# Patient Record
Sex: Male | Born: 1935 | Race: White | Hispanic: No | Marital: Married | Smoking: Former smoker
Health system: Southern US, Community
[De-identification: ages and names within clinical notes are randomized; demographics above are authoritative.]

## PROBLEM LIST (undated history)

## (undated) DIAGNOSIS — I5022 Chronic systolic (congestive) heart failure: Secondary | ICD-10-CM

## (undated) DIAGNOSIS — R51 Headache: Secondary | ICD-10-CM

## (undated) DIAGNOSIS — I739 Peripheral vascular disease, unspecified: Secondary | ICD-10-CM

## (undated) DIAGNOSIS — I350 Nonrheumatic aortic (valve) stenosis: Secondary | ICD-10-CM

## (undated) DIAGNOSIS — G4733 Obstructive sleep apnea (adult) (pediatric): Secondary | ICD-10-CM

## (undated) DIAGNOSIS — N200 Calculus of kidney: Secondary | ICD-10-CM

## (undated) DIAGNOSIS — Z9889 Other specified postprocedural states: Secondary | ICD-10-CM

## (undated) DIAGNOSIS — M1990 Unspecified osteoarthritis, unspecified site: Secondary | ICD-10-CM

## (undated) DIAGNOSIS — I214 Non-ST elevation (NSTEMI) myocardial infarction: Secondary | ICD-10-CM

## (undated) DIAGNOSIS — E785 Hyperlipidemia, unspecified: Secondary | ICD-10-CM

## (undated) DIAGNOSIS — Z8719 Personal history of other diseases of the digestive system: Secondary | ICD-10-CM

## (undated) DIAGNOSIS — I2510 Atherosclerotic heart disease of native coronary artery without angina pectoris: Secondary | ICD-10-CM

## (undated) DIAGNOSIS — R519 Headache, unspecified: Secondary | ICD-10-CM

## (undated) DIAGNOSIS — I469 Cardiac arrest, cause unspecified: Secondary | ICD-10-CM

## (undated) HISTORY — PX: CORONARY ARTERY BYPASS GRAFT: SHX141

## (undated) HISTORY — PX: HERNIA REPAIR: SHX51

## (undated) HISTORY — PX: TONSILLECTOMY: SUR1361

## (undated) HISTORY — PX: ADENOIDECTOMY: SHX5191

## (undated) HISTORY — DX: Hyperlipidemia, unspecified: E78.5

## (undated) HISTORY — DX: Peripheral vascular disease, unspecified: I73.9

## (undated) HISTORY — PX: OTHER SURGICAL HISTORY: SHX169

## (undated) HISTORY — DX: Cardiac arrest, cause unspecified: I46.9

## (undated) HISTORY — DX: Obstructive sleep apnea (adult) (pediatric): G47.33

## (undated) HISTORY — PX: CARDIAC DEFIBRILLATOR PLACEMENT: SHX171

## (undated) HISTORY — DX: Atherosclerotic heart disease of native coronary artery without angina pectoris: I25.10

## (undated) HISTORY — PX: APPENDECTOMY: SHX54

---

## 2001-05-08 ENCOUNTER — Ambulatory Visit (HOSPITAL_COMMUNITY): Admission: RE | Admit: 2001-05-08 | Discharge: 2001-05-08 | Payer: Self-pay | Admitting: *Deleted

## 2001-10-02 ENCOUNTER — Ambulatory Visit (HOSPITAL_COMMUNITY): Admission: RE | Admit: 2001-10-02 | Discharge: 2001-10-02 | Payer: Self-pay | Admitting: Cardiology

## 2001-10-09 ENCOUNTER — Ambulatory Visit (HOSPITAL_COMMUNITY): Admission: RE | Admit: 2001-10-09 | Discharge: 2001-10-10 | Payer: Self-pay | Admitting: Cardiology

## 2001-10-31 ENCOUNTER — Ambulatory Visit (HOSPITAL_COMMUNITY): Admission: RE | Admit: 2001-10-31 | Discharge: 2001-10-31 | Payer: Self-pay | Admitting: Cardiology

## 2002-01-26 ENCOUNTER — Encounter (HOSPITAL_COMMUNITY): Admission: RE | Admit: 2002-01-26 | Discharge: 2002-03-23 | Payer: Self-pay | Admitting: Cardiology

## 2004-03-09 ENCOUNTER — Inpatient Hospital Stay (HOSPITAL_BASED_OUTPATIENT_CLINIC_OR_DEPARTMENT_OTHER): Admission: RE | Admit: 2004-03-09 | Discharge: 2004-03-09 | Payer: Self-pay | Admitting: Cardiology

## 2004-03-14 ENCOUNTER — Ambulatory Visit (HOSPITAL_COMMUNITY): Admission: RE | Admit: 2004-03-14 | Discharge: 2004-03-15 | Payer: Self-pay | Admitting: Cardiology

## 2005-10-18 ENCOUNTER — Inpatient Hospital Stay (HOSPITAL_COMMUNITY): Admission: RE | Admit: 2005-10-18 | Discharge: 2005-10-19 | Payer: Self-pay | Admitting: Cardiology

## 2007-02-13 ENCOUNTER — Ambulatory Visit (HOSPITAL_COMMUNITY): Admission: RE | Admit: 2007-02-13 | Discharge: 2007-02-13 | Payer: Self-pay | Admitting: Cardiology

## 2007-06-16 ENCOUNTER — Ambulatory Visit (HOSPITAL_COMMUNITY): Admission: RE | Admit: 2007-06-16 | Discharge: 2007-06-16 | Payer: Self-pay | Admitting: General Surgery

## 2008-01-27 ENCOUNTER — Inpatient Hospital Stay (HOSPITAL_COMMUNITY): Admission: RE | Admit: 2008-01-27 | Discharge: 2008-01-28 | Payer: Self-pay | Admitting: Cardiology

## 2008-02-19 ENCOUNTER — Ambulatory Visit: Payer: Self-pay | Admitting: *Deleted

## 2008-03-17 ENCOUNTER — Ambulatory Visit: Payer: Self-pay | Admitting: *Deleted

## 2008-03-17 ENCOUNTER — Ambulatory Visit (HOSPITAL_COMMUNITY): Admission: RE | Admit: 2008-03-17 | Discharge: 2008-03-17 | Payer: Self-pay | Admitting: *Deleted

## 2008-04-15 ENCOUNTER — Ambulatory Visit: Payer: Self-pay | Admitting: *Deleted

## 2008-07-16 ENCOUNTER — Ambulatory Visit: Payer: Self-pay | Admitting: *Deleted

## 2008-09-02 ENCOUNTER — Ambulatory Visit: Payer: Self-pay | Admitting: *Deleted

## 2008-09-17 ENCOUNTER — Ambulatory Visit: Payer: Self-pay | Admitting: Vascular Surgery

## 2008-09-17 ENCOUNTER — Ambulatory Visit (HOSPITAL_COMMUNITY): Admission: RE | Admit: 2008-09-17 | Discharge: 2008-09-17 | Payer: Self-pay | Admitting: *Deleted

## 2008-10-28 ENCOUNTER — Ambulatory Visit: Payer: Self-pay | Admitting: *Deleted

## 2009-05-04 ENCOUNTER — Encounter: Admission: RE | Admit: 2009-05-04 | Discharge: 2009-05-04 | Payer: Self-pay | Admitting: Cardiology

## 2009-05-10 ENCOUNTER — Ambulatory Visit (HOSPITAL_COMMUNITY): Admission: RE | Admit: 2009-05-10 | Discharge: 2009-05-10 | Payer: Self-pay | Admitting: Cardiology

## 2009-08-29 ENCOUNTER — Ambulatory Visit: Payer: Self-pay | Admitting: Emergency Medicine

## 2009-08-29 ENCOUNTER — Inpatient Hospital Stay (HOSPITAL_COMMUNITY): Admission: EM | Admit: 2009-08-29 | Discharge: 2009-09-13 | Payer: Self-pay | Admitting: Emergency Medicine

## 2009-08-29 ENCOUNTER — Ambulatory Visit: Payer: Self-pay | Admitting: Internal Medicine

## 2009-09-01 ENCOUNTER — Encounter: Payer: Self-pay | Admitting: Cardiology

## 2009-09-05 ENCOUNTER — Encounter: Payer: Self-pay | Admitting: Internal Medicine

## 2009-09-21 ENCOUNTER — Encounter: Payer: Self-pay | Admitting: Internal Medicine

## 2009-09-28 ENCOUNTER — Encounter: Payer: Self-pay | Admitting: Internal Medicine

## 2009-09-28 ENCOUNTER — Ambulatory Visit: Payer: Self-pay

## 2009-09-29 ENCOUNTER — Encounter (HOSPITAL_COMMUNITY): Admission: RE | Admit: 2009-09-29 | Discharge: 2009-12-28 | Payer: Self-pay | Admitting: Cardiology

## 2009-10-12 ENCOUNTER — Encounter: Admission: RE | Admit: 2009-10-12 | Discharge: 2009-10-12 | Payer: Self-pay | Admitting: Cardiology

## 2009-10-21 ENCOUNTER — Ambulatory Visit: Payer: Self-pay | Admitting: Internal Medicine

## 2009-10-21 DIAGNOSIS — I4891 Unspecified atrial fibrillation: Secondary | ICD-10-CM | POA: Insufficient documentation

## 2009-10-22 ENCOUNTER — Encounter: Payer: Self-pay | Admitting: Internal Medicine

## 2009-11-04 ENCOUNTER — Encounter: Payer: Self-pay | Admitting: Internal Medicine

## 2009-11-07 ENCOUNTER — Telehealth: Payer: Self-pay | Admitting: Internal Medicine

## 2009-11-11 ENCOUNTER — Telehealth: Payer: Self-pay | Admitting: Internal Medicine

## 2009-11-11 ENCOUNTER — Ambulatory Visit: Payer: Self-pay | Admitting: Cardiology

## 2009-11-11 ENCOUNTER — Encounter: Payer: Self-pay | Admitting: Internal Medicine

## 2009-11-14 ENCOUNTER — Encounter (INDEPENDENT_AMBULATORY_CARE_PROVIDER_SITE_OTHER): Payer: Self-pay | Admitting: *Deleted

## 2009-11-14 ENCOUNTER — Encounter: Payer: Self-pay | Admitting: Internal Medicine

## 2009-11-16 ENCOUNTER — Ambulatory Visit (HOSPITAL_COMMUNITY): Admission: RE | Admit: 2009-11-16 | Discharge: 2009-11-16 | Payer: Self-pay | Admitting: Cardiology

## 2009-11-16 ENCOUNTER — Ambulatory Visit: Payer: Self-pay | Admitting: Cardiology

## 2009-11-16 HISTORY — PX: CARDIAC CATHETERIZATION: SHX172

## 2009-11-18 ENCOUNTER — Ambulatory Visit: Payer: Self-pay | Admitting: Cardiology

## 2009-12-02 ENCOUNTER — Ambulatory Visit: Payer: Self-pay | Admitting: Cardiology

## 2009-12-13 ENCOUNTER — Ambulatory Visit: Payer: Self-pay | Admitting: Cardiology

## 2009-12-13 ENCOUNTER — Ambulatory Visit: Payer: Self-pay | Admitting: Internal Medicine

## 2009-12-21 ENCOUNTER — Encounter: Payer: Self-pay | Admitting: Internal Medicine

## 2009-12-21 ENCOUNTER — Ambulatory Visit: Payer: Self-pay | Admitting: Cardiology

## 2010-01-07 ENCOUNTER — Encounter (HOSPITAL_COMMUNITY): Admission: RE | Admit: 2010-01-07 | Discharge: 2010-03-08 | Payer: Self-pay | Admitting: Cardiology

## 2010-01-19 ENCOUNTER — Emergency Department (HOSPITAL_COMMUNITY): Admission: EM | Admit: 2010-01-19 | Discharge: 2010-01-19 | Payer: Self-pay | Admitting: Emergency Medicine

## 2010-02-20 ENCOUNTER — Ambulatory Visit: Payer: Self-pay | Admitting: Cardiology

## 2010-02-24 ENCOUNTER — Ambulatory Visit: Payer: Self-pay | Admitting: Cardiology

## 2010-03-16 ENCOUNTER — Ambulatory Visit: Payer: Self-pay | Admitting: Internal Medicine

## 2010-03-30 ENCOUNTER — Ambulatory Visit: Payer: Self-pay | Admitting: Internal Medicine

## 2010-03-30 DIAGNOSIS — Z95810 Presence of automatic (implantable) cardiac defibrillator: Secondary | ICD-10-CM | POA: Insufficient documentation

## 2010-04-18 ENCOUNTER — Encounter (INDEPENDENT_AMBULATORY_CARE_PROVIDER_SITE_OTHER): Payer: Self-pay | Admitting: *Deleted

## 2010-04-19 ENCOUNTER — Encounter (INDEPENDENT_AMBULATORY_CARE_PROVIDER_SITE_OTHER): Payer: Self-pay | Admitting: *Deleted

## 2010-04-24 ENCOUNTER — Encounter
Admission: RE | Admit: 2010-04-24 | Discharge: 2010-04-24 | Payer: Self-pay | Source: Home / Self Care | Attending: Orthopedic Surgery | Admitting: Orthopedic Surgery

## 2010-05-11 NOTE — Assessment & Plan Note (Signed)
Summary: rov/per dr Marthe Dant/jml   Visit Type:  new pt visit Primary Provider:  [name]   History of Present Illness:   [name] is seen in followup for aborted cardiac arrest in the setting of ischemic heart disease prior bypass grafting and redo bypass grafting most recently in 1994 with subsequent PCI and mild aortic stenosis.  echo in June 2011 demonstrated ejection fraction of 35-40%.  Since discharge he is into quite well. However he has had some episodes of throat tightness which ffor  him is an anginal equivalent. Dr. Patty Sermons in response this is up titrating his beta blocker.  He remains in atrial fibrillation. He notes no impairment in exercise tolerance. No dizziness or palpitations. From embolic risk factors are notable for age vascular disease and hypertensionand LV dysfunction.  the CHADS VASC score of 4. He is on Coumadin  Current Medications (verified): 1)  Aspirin 81 Mg Tbec (Aspirin) .... Take 1 Tablet By Mouth Once Daily 2)  Furosemide 40 Mg Tabs (Furosemide) .... Take 1 Tablet By Mouth Once Daily 3)  Isosorbide Mononitrate Cr 30 Mg Xr24h-Tab (Isosorbide Mononitrate) .... Take 1 Tablet By Mouth Once Daily 4)  Lisinopril 20 Mg Tabs (Lisinopril) .Marland Kitchen.. 1 Tab Qam 5)  Metoprolol Tartrate 25 Mg Tabs (Metoprolol Tartrate) .... 4 Tabs Qam..3 Tabs At Bedtime 6)  Potassium Chloride Cr 10 Meq Cr-Tabs (Potassium Chloride) .Marland Kitchen.. 1 Tab Two Times A Day 7)  Crestor 20 Mg Tabs (Rosuvastatin Calcium) .... Take 1 Tablet By Mouth Once Daily 8)  Warfarin Sodium 2.5 Mg Tabs (Warfarin Sodium) .... Take As Directed By Protime Results. 9)  Multivitamins  Tabs (Multiple Vitamin) .... Take 1 Tablet By Mouth Once Daily 10)  Nitrostat 0.4 Mg Subl (Nitroglycerin) .... Take 1 Tablet By Mouth Every 5 Minutes As Needed Up To 3 Doses As Needed 11)  Plavix 75 Mg Tabs (Clopidogrel Bisulfate) .... Take 1 Tablet By Mouth Every Morning. 12)  Slo-Niacin 500 Mg Cr-Tabs (Niacin) .... Take 1 Capsule By  Mouth Once Daily 13)  B-100  Tabs (Vitamins-Lipotropics) .... Take 1 Tablet By Mouth Once Daily  Allergies: 1)  ! Tetracycline  Past History:  Past Medical History: Last updated: 10/19/2009 Aborted cardiac arrest; atrial flutter/fibrillation Medtronic Secure ICD  Past Surgical History: Last updated: 10/19/2009  PAST SURGICAL HISTORY:  CABG in 1982 and in 1984 with subsequent PTCI Appendectomy Tonsillectomy and adenoidectomy  Cataract surgery Left elbow tendon surgery  Family History: Last updated: 10/19/2009  His family history is significant for early coronary artery disease in   several members  Social History: Last updated: 10/19/2009 The patient is a former remote smoker He does not use any other substances He does not use alcohol  Vital Signs:  Patient profile:   77 year old male Height:      66 inches Weight:      144 pounds BMI:     23.33 Pulse rate:   79 / minute BP sitting:   106 / 71  (left arm) Cuff size:   regular  Vitals Entered By: Danielle Rankin, CMA (October 21, 2009 2:07 PM)  Physical Exam  General:  The patient was alert and oriented in no acute distress. HEENT Normal.  Neck veins were flat, carotids were brisk.  Lungs were clear.  Heart sounds were regular without murmurs or gallops. device pocket is well-healed Abdomen was soft with active bowel sounds. There is no clubbing cyanosis or edema. Skin Warm and dry     ICD Specifications Following  MD:  [name], MD     ICD Vendor:  Medtronic     ICD Model Number:  [phone]     ICD Serial Number:  [id number] ICD DOI:  09/07/2009     ICD Implanting MD:  [name], MD  Lead 1:    Location: RA     DOI: 09/07/2009     Model #: [id number]     Serial #: [id number]     Status: active Lead 2:    Location: RV     DOI: 09/07/2009     Model #: [id number]     Serial #: [id number]     Status: active  Indications::  Aborted cardiac arrest   ICD Follow Up Battery Voltage:  3.19 V     Charge Time:  9.3 seconds      Underlying rhythm:  AF ICD Dependent:  No       ICD Device Measurements Atrium:  Amplitude: 1.5 mV, Impedance: 494 ohms,  Right Ventricle:  Amplitude: 10.9 mV, Impedance: 418 ohms, Threshold: 1.00 V at 0.40 msec Shock Impedance: 37/45 ohms   Episodes MS Episodes:  1     Percent Mode Switch:  100%     Shock:  0     ATP:  0     Nonsustained:  0     Atrial Therapies:  0 Ventricular Pacing:  65.7%  Brady Parameters Mode MVP     Lower Rate Limit:  60     Upper Rate Limit 130 PAV 180     Sensed AV Delay:  150  Tachy Zones VF:  200     VT:  OFF     VT1:  200-240 (VIA VF)     Tech Comments:  PT IN AF 100% OF TIME.  NORMAL DEVICE FUNCTION.  NO CHANGES MADE.  OV W/SK IN SEPTEMBER ALREADY SCHEDULED.  Vella Kohler  Impression & Recommendations:  Problem # 1:  ATRIAL FIBRILLATION (ICD-427.31) The patient has atrial fibrillation which has been persistent. He unfortunately had a subtherapeutic INR process albeit 1.9) last week. We'll anticipate undertaking cardioversion in 3 weeks time if his INR today is therapeutic. We have reviewed extensively the issues of rate versus rhythm control. He has no clear symptoms attributable to his atrial fibrillation. Hence I would be disinclined to pursue antiarrhythmic trials for the maintenance of sinus rhythm. hoping that he will maintain sinus rhythm following an unsupported cardioversion I think however makes good sense  Problem # 2:  ABORTED CARDIAC ARREST (ICD-427.5) no intercurrent ventricular arrhythmia  Problem # 3:  CARDIOMYOPATHY, ISCHEMIC S/P CABG (ICD-414.8) recurrent chest pains as of up titration of his beta blocker. His updated medication list for this problem includes:    Aspirin 81 Mg Tbec (Aspirin) .Marland Kitchen... Take 1 tablet by mouth once daily    Furosemide 40 Mg Tabs (Furosemide) .Marland Kitchen... Take 1 tablet by mouth once daily    Isosorbide Mononitrate Cr 30 Mg Xr24h-tab (Isosorbide mononitrate) .Marland Kitchen... Take 1 tablet by mouth once daily    Lisinopril  20 Mg Tabs (Lisinopril) .Marland Kitchen... 1 tab qam    Metoprolol Tartrate 25 Mg Tabs (Metoprolol tartrate) .Marland KitchenMarland KitchenMarland KitchenMarland Kitchen 4 tabs qam..3 tabs at bedtime    Warfarin Sodium 2.5 Mg Tabs (Warfarin sodium) .Marland Kitchen... Take as directed by protime results.    Nitrostat 0.4 Mg Subl (Nitroglycerin) .Marland Kitchen... Take 1 tablet by mouth every 5 minutes as needed up to 3 doses as needed    Plavix 75 Mg Tabs (Clopidogrel bisulfate) .Marland Kitchen... Take 1 tablet  by mouth every morning.  Patient Instructions: 1)  Call next week with INR results to schedule cardioversion.

## 2010-05-11 NOTE — Miscellaneous (Signed)
Summary: Device preload  Clinical Lists Changes  Observations: Added new observation of ICD INDICATN: Aborted cardiac arrest (09/21/2009 12:31) Added new observation of ICDLEADSTAT2: active (09/21/2009 12:31) Added new observation of ICDLEADSER2: [id number] (09/21/2009 12:31) Added new observation of ICDLEADMOD2: 6947  (09/21/2009 12:31) Added new observation of ICDLEADLOC2: RV  (09/21/2009 12:31) Added new observation of ICDLEADSTAT1: active  (09/21/2009 12:31) Added new observation of ICDLEADSER1: [id number]  (09/21/2009 12:31) Added new observation of ICDLEADMOD1: 5076  (09/21/2009 12:31) Added new observation of ICDLEADLOC1: RA  (09/21/2009 12:31) Added new observation of ICDLEADDOI2: 09/07/2009  (09/21/2009 12:31) Added new observation of ICDLEADDOI1: 09/07/2009  (09/21/2009 12:31) Added new observation of ICD IMP MD: [name], MD  (09/21/2009 12:31) Added new observation of ICD IMPL DTE: 09/07/2009  (09/21/2009 12:31) Added new observation of ICD SERL#: [id number] H  (09/21/2009 12:31) Added new observation of ICD MODL#: D204DRM  (09/21/2009 12:31) Added new observation of ICDMANUFACTR: Medtronic  (09/21/2009 12:31) Added new observation of ICD MD: [name], MD  (09/21/2009 12:31)       ICD Specifications Following MD:  [name], MD     ICD Vendor:  Medtronic     ICD Model Number:  [id number]     ICD Serial Number:  [id number] ICD DOI:  09/07/2009     ICD Implanting MD:  [name], MD  Lead 1:    Location: RA     DOI: 09/07/2009     Model #: [id number]     Serial #: [id number]     Status: active Lead 2:    Location: RV     DOI: 09/07/2009     Model #: 4403     Serial #: [id number]     Status: active  Indications::  Aborted cardiac arrest

## 2010-05-11 NOTE — Progress Notes (Signed)
Summary: calling re getting his heart in rthym  Phone Note Call from Patient   Reason for Call: Talk to Nurse Summary of Call: pt wants to talk to a nurse about getting his heart back in rhythm pls call [phone] Initial call taken by: Glynda Jaeger,  November 07, 2009 2:33 PM  Follow-up for Phone Call        PT AWARE  ONCE INR DONE ON FRI 11/11/09 WITH RESULTS OF GREATER THAN 2 MAY PROCEED WITH SCHEDULING CARDIOVERSION .WILL SCHEDULE ONCE INR RESULTS RECEIVED . PT AWARE.  Follow-up by: Scherrie Bateman, LPN,  November 07, 2009 3:47 PM  Additional Follow-up for Phone Call Additional follow up Details #1::        DCCV discussed with heather schub Additional Follow-up by: Nathen May, MD, [facility],  November 14, 2009 1:56 PM    Additional Follow-up for Phone Call Additional follow up Details #2::    Pt scheduled for 11/16/09 with Dr. Shirlee Latch. Pt aware. Follow-up by: Sherri Rad, RN, BSN,  November 14, 2009 2:22 PM

## 2010-05-11 NOTE — Progress Notes (Signed)
Summary: calling re procedure  Phone Note Call from Patient   Caller: Patient [phone] Reason for Call: Talk to Nurse Summary of Call: questions re procedure at the hosptial to shock his heart back into rythympls call [phone] Initial call taken by: Glynda Jaeger,  November 11, 2009 3:40 PM  Follow-up for Phone Call        I spoke with [name] who reports having his INR drawn today at [facility].  We have not received the results yet.  He would like to schedule his cardioversion as soon as possible.  I will forward this to Dr.Klein's nurse.  Mylo Red RN

## 2010-05-11 NOTE — Assessment & Plan Note (Signed)
Summary: 5 month rov.sl   Visit Type:  ICD-Medtronic Primary Provider:  [name]  CC:  no complaints.  History of Present Illness:   [name] is seen in followup for aborted cardiac arrest in the setting of ischemic heart disease prior bypass grafting and redo bypass grafting most recently in 1994 with subsequent PCI and mild aortic stenosis.  echo in June 2011 demonstrated ejection fraction of 35-40%.   when we last saw him he was in atrial fibrillation. He noted no impairment in exercise tolerance. No dizziness or palpitations. Thrombo- embolic risk factors are notable for age vascular disease and hypertensionand LV dysfunction.  the CHADS VASC score of 4. He was on Coumadin.      he has intercurrently undergone cardioversion, and low and  his energy level is markedly improved.  subsequent to that, Dr. Patty Sermons discontinued his Coumadin.  serendipitously, his Coumadin was off therefore when a 6 foot tree limb fell 70 feet and clunked  him on the head  Problems Prior to Update: 1)  Automatic Implantable Cardiac Defibrillator Situ  (ICD-V45.02) 2)  Cardiomyopathy, Ischemic S/p Cabg  (ICD-414.8) 3)  Aborted Cardiac Arrest  (ICD-427.5) 4)  Atrial Fibrillation  (ICD-427.31)  Current Medications (verified): 1)  Aspirin 81 Mg Tbec (Aspirin) .... Take 1 Tablet By Mouth Once Daily 2)  Furosemide 40 Mg Tabs (Furosemide) .... Take 1 Tablet By Mouth Once Daily 3)  Isosorbide Mononitrate Cr 30 Mg Xr24h-Tab (Isosorbide Mononitrate) .... Take One Tablet By Mouth Daily 4)  Lisinopril 20 Mg Tabs (Lisinopril) .Marland Kitchen.. 1 Tab Qam 5)  Metoprolol Tartrate 25 Mg Tabs (Metoprolol Tartrate) .... 4 Tabs Qam..3 Tabs At Bedtime 6)  Potassium Chloride Cr 10 Meq Cr-Tabs (Potassium Chloride) .Marland Kitchen.. 1 Tab Two Times A Day 7)  Crestor 20 Mg Tabs (Rosuvastatin Calcium) .... Take 1 Tablet By Mouth Once Daily 8)  Multivitamins  Tabs (Multiple Vitamin) .... Take 1 Tablet By Mouth Once Daily 9)  Nitrostat 0.4 Mg  Subl (Nitroglycerin) .... Take 1 Tablet By Mouth Every 5 Minutes As Needed Up To 3 Doses As Needed 10)  Plavix 75 Mg Tabs (Clopidogrel Bisulfate) .... Take 1 Tablet By Mouth Every Morning. 11)  Slo-Niacin 500 Mg Cr-Tabs (Niacin) .... Take 1 Capsule By Mouth Once Daily 12)  B-100  Tabs (Vitamins-Lipotropics) .... Take 1 Tablet By Mouth Once Daily 13)  Stagesic 5-500 Mg Caps (Hydrocodone-Acetaminophen) .... As Needed  Allergies (verified): 1)  ! Tetracycline 2)  ! Flexeril  Past History:  Past Medical History: Last updated: 10/19/2009 Aborted cardiac arrest; atrial flutter/fibrillation Medtronic Secure ICD  Past Surgical History: Last updated: 10/19/2009  PAST SURGICAL HISTORY:  CABG in 1982 and in 1984 with subsequent PTCI Appendectomy Tonsillectomy and adenoidectomy  Cataract surgery Left elbow tendon surgery  Family History: Last updated: 03/30/2010  His family history is significant for early coronary artery disease in   several members  Social History: Last updated: 03/30/2010 The patient is a former remote smoker He does not use any other substances He does not use alcohol  Family History:  His family history is significant for early coronary artery disease in   several members  Social History: The patient is a former remote smoker He does not use any other substances He does not use alcohol  Vital Signs:  Patient profile:   77 year old male Height:      66 inches Weight:      146.75 pounds BMI:     23.77 Pulse rate:  59 / minute BP sitting:   111 / 58  (left arm) Cuff size:   regular  Vitals Entered By: [name] (March 30, 2010 10:36 AM)  Physical Exam  General:  The patient was alert and oriented in no acute distress. HEENT Normal.  Neck veins were flat, carotids were brisk.  Lungs were clear.  Heart sounds were regular without murmurs or gallops.  Abdomen was soft with active bowel sounds. There is no clubbing cyanosis or edema. Skin  Warm and dry     ICD Specifications Following MD:  [name], MD     ICD Vendor:  Medtronic     ICD Model Number:  [id number]     ICD Serial Number:  [id number] ICD DOI:  09/07/2009     ICD Implanting MD:  [name], MD  Lead 1:    Location: RA     DOI: 09/07/2009     Model #: 4098     Serial #: [id number]     Status: active Lead 2:    Location: RV     DOI: 09/07/2009     Model #: 6213     Serial #: [id number]     Status: active  Indications::  Aborted cardiac arrest   ICD Follow Up Remote Check?  No Battery Voltage:  3.18 V     Charge Time:  8.6 seconds     Underlying rhythm:  SR ICD Dependent:  No       ICD Device Measurements Atrium:  Amplitude: 2.0 mV, Impedance: 532 ohms, Threshold: 0.5 V at 0.4 msec Right Ventricle:  Amplitude: 8.1 mV, Impedance: 418 ohms, Threshold: 1.375 V at 0.4 msec Shock Impedance: 37/48 ohms   Episodes MS Episodes:  0     Percent Mode Switch:  0     Coumadin:  No Shock:  0     ATP:  0     Nonsustained:  0     Atrial Pacing:  65.6%     Ventricular Pacing:  0.2%  Brady Parameters Mode MVP     Lower Rate Limit:  60     Upper Rate Limit 130 PAV 180     Sensed AV Delay:  150  Tachy Zones VF:  200     VT:  OFF     VT1:  200-240 (VIA VF)     Next Remote Date:  06/29/2010     Next Cardiology Appt Due:  03/10/2011 Tech Comments:  No parameter changes.  Device function normal.  Optivol and thoracic impedance abnormal ongoing since 12/20.  Carelink transmissions every 3 months.  ROV 1 year with Dr. [name]. [name], LPN  March 30, 2010 11:09 AM   Impression & Recommendations:  Problem # 1:  ATRIAL FIBRILLATION (ICD-427.31) the patient is holding sinus rhythm. It would be my recommendation that he be maintained on oral anticoagulation given his high CHADS VASC score. The following medications were removed from the medication list:    Warfarin Sodium 2.5 Mg Tabs (Warfarin sodium) .Marland Kitchen... Take as directed by protime results. His updated medication  list for this problem includes:    Aspirin 81 Mg Tbec (Aspirin) .Marland Kitchen... Take 1 tablet by mouth once daily    Metoprolol Tartrate 25 Mg Tabs (Metoprolol tartrate) .Marland KitchenMarland KitchenMarland KitchenMarland Kitchen 4 tabs qam..3 tabs at bedtime    Plavix 75 Mg Tabs (Clopidogrel bisulfate) .Marland Kitchen... Take 1 tablet by mouth every morning.  Problem # 2:  CARDIOMYOPATHY, ISCHEMIC S/P CABG (ICD-414.8) stable on current medications  Problem # 3:  AUTOMATIC IMPLANTABLE CARDIAC DEFIBRILLATOR SITU (ICD-V45.02) Device parameters and data were reviewed and no changes were made  optivol is high buth the impedance is climbing  Patient Instructions: 1)  Your physician recommends that you continue on your current medications as directed. Please refer to the Current Medication list given to you today. 2)  Your physician wants you to follow-up in: 1 year You will receive a reminder letter in the mail two months in advance. If you don't receive a letter, please call our office to schedule the follow-up appointment.

## 2010-05-11 NOTE — Assessment & Plan Note (Signed)
Summary: PACER CHECK./CY   Primary Provider:  [name]  CC:  pacer check.  History of Present Illness:   [name] is seen in followup for aborted cardiac arrest in the setting of ischemic heart disease prior bypass grafting and redo bypass grafting most recently in 1994 with subsequent PCI and mild aortic stenosis.  echo in June 2011 demonstrated ejection fraction of 35-40%.   when we last saw him he was in atrial fibrillation. He noted no impairment in exercise tolerance. No dizziness or palpitations. Thrombo- embolic risk factors are notable for age vascular disease and hypertensionand LV dysfunction.  the CHADS VASC score of 4. He is on Coumadin.      he has intercurrently undergone cardioversion, and low and  his energy level is markedly improved.  Current Medications (verified): 1)  Aspirin 81 Mg Tbec (Aspirin) .... Take 1 Tablet By Mouth Once Daily 2)  Furosemide 40 Mg Tabs (Furosemide) .... Take 1 Tablet By Mouth Once Daily 3)  Isosorbide Mononitrate Cr 60 Mg Xr24h-Tab (Isosorbide Mononitrate) .... Take One Tablet Once Daily 4)  Lisinopril 20 Mg Tabs (Lisinopril) .Marland Kitchen.. 1 Tab Qam 5)  Metoprolol Tartrate 25 Mg Tabs (Metoprolol Tartrate) .... 4 Tabs Qam..3 Tabs At Bedtime 6)  Potassium Chloride Cr 10 Meq Cr-Tabs (Potassium Chloride) .Marland Kitchen.. 1 Tab Two Times A Day 7)  Crestor 20 Mg Tabs (Rosuvastatin Calcium) .... Take 1 Tablet By Mouth Once Daily 8)  Warfarin Sodium 2.5 Mg Tabs (Warfarin Sodium) .... Take As Directed By Protime Results. 9)  Multivitamins  Tabs (Multiple Vitamin) .... Take 1 Tablet By Mouth Once Daily 10)  Nitrostat 0.4 Mg Subl (Nitroglycerin) .... Take 1 Tablet By Mouth Every 5 Minutes As Needed Up To 3 Doses As Needed 11)  Plavix 75 Mg Tabs (Clopidogrel Bisulfate) .... Take 1 Tablet By Mouth Every Morning. 12)  Slo-Niacin 500 Mg Cr-Tabs (Niacin) .... Take 1 Capsule By Mouth Once Daily 13)  B-100  Tabs (Vitamins-Lipotropics) .... Take 1 Tablet By Mouth Once  Daily  Allergies (verified): 1)  ! Tetracycline  Past History:  Past Medical History: Last updated: 10/19/2009 Aborted cardiac arrest; atrial flutter/fibrillation Medtronic Secure ICD  Past Surgical History: Last updated: 10/19/2009  PAST SURGICAL HISTORY:  CABG in 1982 and in 1984 with subsequent PTCI Appendectomy Tonsillectomy and adenoidectomy  Cataract surgery Left elbow tendon surgery  Family History: Last updated: 10/19/2009  His family history is significant for early coronary artery disease in   several members  Social History: Last updated: 10/19/2009 The patient is a former remote smoker He does not use any other substances He does not use alcohol  Vital Signs:  Patient profile:   77 year old male Height:      66 inches Weight:      143 pounds BMI:     23.16 Pulse rate:   82 / minute Pulse rhythm:   regular BP sitting:   128 / 78  (right arm) Cuff size:   regular  Vitals Entered By: [name] CMA (December 13, 2009 10:20 AM)  Physical Exam  General:  The patient was alert and oriented in no acute distress. HEENT Normal.  Neck veins were flat, carotids were brisk.  Lungs were clear.  Heart sounds were regular without murmurs or gallops.  Abdomen was soft with active bowel sounds. There is no clubbing cyanosis or edema. Skin Warm and dry     ICD Specifications Following MD:  [name], MD     ICD Vendor:  Medtronic     ICD Model Number:  D204DRM     ICD Serial Number:  [id number] ICD DOI:  09/07/2009     ICD Implanting MD:  [name], MD  Lead 1:    Location: RA     DOI: 09/07/2009     Model #: 4098     Serial #: [id number]     Status: active Lead 2:    Location: RV     DOI: 09/07/2009     Model #: 6213     Serial #: [id number]     Status: active  Indications::  Aborted cardiac arrest   ICD Follow Up Battery Voltage:  3.17 V     Charge Time:  8.6 seconds     Underlying rhythm:  SR ICD Dependent:  No       ICD Device  Measurements Atrium:  Amplitude: 2.6 mV, Impedance: 532 ohms, Threshold: 0.50 V at 0.40 msec Right Ventricle:  Amplitude: 11.8 mV, Impedance: 475 ohms, Threshold: 1.00 V at 0.40 msec Shock Impedance: 43/53 ohms   Episodes MS Episodes:  0     Shock:  0     ATP:  0     Nonsustained:  0     Atrial Therapies:  0  Brady Parameters Mode MVP     Lower Rate Limit:  60     Upper Rate Limit 130 PAV 180     Sensed AV Delay:  150  Tachy Zones VF:  200     VT:  OFF     VT1:  200-240 (VIA VF)     Next Remote Date:  03/16/2010     Next Cardiology Appt Due:  09/11/2010 Tech Comments:  NORMAL DEVICE FUNCTION.  NO EPISODES SINCE LAST CHECK ON 11-16-09.  TURNED ON 1:1 SVT.  CHANGED RA OUTPUT FROM 3.5 TO 2.0 AND RV OUTPUT FROM 3.5 TO 2.5.  [facility] CHECK 03-16-10. ROV IN [employer] W/SK. Vella Kohler  December 13, 2009 10:37 AM  Impression & Recommendations:  Problem # 1:  ATRIAL FIBRILLATION (ICD-427.31) he is status post cardioversion. He is holding sinus rhythm. I've instructed him in the taking of his pulse. He is to call us in the event that he reverts to atrial fibrillation.  Given the marked improvement in his symptoms following cardioversion, I would anticipate using antiarrhythmic drug therapy in the event that he were to have frequent relapses of atrial fibrillation. He event that they were quite sporadic a greater than 6 months, serial cardioversion might be a reasonable approach His updated medication list for this problem includes:    Aspirin 81 Mg Tbec (Aspirin) .Marland Kitchen... Take 1 tablet by mouth once daily    Metoprolol Tartrate 25 Mg Tabs (Metoprolol tartrate) .Marland KitchenMarland KitchenMarland KitchenMarland Kitchen 4 tabs qam..3 tabs at bedtime    Warfarin Sodium 2.5 Mg Tabs (Warfarin sodium) .Marland Kitchen... Take as directed by protime results.    Plavix 75 Mg Tabs (Clopidogrel bisulfate) .Marland Kitchen... Take 1 tablet by mouth every morning.  Problem # 2:  ABORTED CARDIAC ARREST (ICD-427.5) no intercurrent ventricular arrhythmia. He asked about driving; I told him 6  months from his event  Problem # 3:  CARDIOMYOPATHY, ISCHEMIC S/P CABG (ICD-414.8) stable on current medications;  interestingly, his throat discomfort which was thought to be an anginal equivalent appears to be more related to his atrial fibrillation as it is completely vanished following cardioversion  Patient Instructions: 1)  Your physician recommends that you continue on your current medications as directed. Please refer to  the Current Medication list given to you today. 2)  Your physician wants you to follow-up in:  9 months You will receive a reminder letter in the mail two months in advance. If you don't receive a letter, please call our office to schedule the follow-up appointment.

## 2010-05-11 NOTE — Cardiovascular Report (Signed)
Summary: Office Visit   Office Visit   Imported By: Roderic Ovens 10/18/2009 12:34:14  _____________________________________________________________________  External Attachment:    Type:   Image     Comment:   External Document

## 2010-05-11 NOTE — Letter (Signed)
Summary: Cardioversion/TEE Instructions  Architectural [employer]ologist, Main Office  [address]   [place], [place] [address]   Phone: [phone]  Fax: [phone]    Cardioversion / TEE Cardioversion Instructions  11/14/2009 MRN: [id number]  [facility] [name] [address] [place], [place]  [address]  Dear [name], You are scheduled for a Cardioversion on Wed. 11/16/09 with Dr. Shirlee Latch.   Please arrive at the [facility] at 8:00 a.m. on the day of your procedure.  1)   DIET:  A)   Nothing to eat or drink after midnight except your medications with a sip of water.  B)   May have clear liquid breakfast, then nothing to eat or drink after _________ a.m. / p.m.      Clear liquids include:  water, broth, Sprite, Ginger Ale, black coffee, tea (no sugar),      cranberry / grape / apple juice, jello (not red), popsicle from clear juices (not red).  2)   Come to the [place] office on _____N/A____________ for lab work. The lab at [facility] is open from 8:30 a.m. to 1:30 p.m. and 2:30 p.m. to 5:00 p.m. The lab at [facility] is open from 7:30 a.m. to 5:30 p.m. You do not have to be fasting.  3)   MAKE SURE YOU TAKE YOUR COUMADIN.  4)   A)   DO NOT TAKE these medications before your procedure:     - hold furosemide the morning of your procedure  B)   YOU MAY TAKE ALL of your remaining medications with a small amount of water.    C)   START NEW medications:       ___N/A________________________________________________________________     ___________________________________________________________________  5)  Must have a responsible person to drive you home.  6)   Bring a current list of your medications and current insurance cards.   * Special Note:  Every effort is made to have your procedure done on time. Occasionally there are emergencies that present themselves at the hospital that may cause delays. Please be patient if a  delay does occur.  * If you have any questions after you get home, please call the office at [phone].

## 2010-05-11 NOTE — Letter (Signed)
Summary: [facility] Progress Note   [facility] Progress Note   Imported By: Roderic Ovens 02/07/2010 16:45:32  _____________________________________________________________________  External Attachment:    Type:   Image     Comment:   External Document

## 2010-05-11 NOTE — Procedures (Signed)
Summary: wound check/jml   Current Medications (verified): 1)  Aspirin 81 Mg Tbec (Aspirin) .... Take 1 Tablet By Mouth Once Daily 2)  Bisacodyl 10 Mg Supp (Bisacodyl) .... As Needed 3)  Ensure Pudding  Pudg (Nutritional Supplements) .... Three Times A Day 4)  Furosemide 40 Mg Tabs (Furosemide) .... Take 1 Tablet By Mouth Once Daily 5)  Isosorbide Mononitrate Cr 30 Mg Xr24h-Tab (Isosorbide Mononitrate) .... Take 1 Tablet By Mouth Once Daily 6)  Lisinopril 10 Mg Tabs (Lisinopril) .... Take 1 Tablet By Mouth Two Times A Day 7)  Metoprolol Tartrate 25 Mg Tabs (Metoprolol Tartrate) .... Take 1 Tablet By Mouth Two Times A Day 8)  Polyethylene Glycol 3350  Pack (Polyethylene Glycol 3350) .... As Needed. 9)  Potassium Chloride 20 Meq Pack (Potassium Chloride) .... Take 1 Tablet By Mouth Once Daily 10)  Crestor 20 Mg Tabs (Rosuvastatin Calcium) .... Take 1 Tablet By Mouth Once Daily 11)  Warfarin Sodium 2.5 Mg Tabs (Warfarin Sodium) .... Take As Directed By Protime Results. 12)  Multivitamins  Tabs (Multiple Vitamin) .... Take 1 Tablet By Mouth Once Daily 13)  Nitrostat 0.4 Mg Subl (Nitroglycerin) .... Take 1 Tablet By Mouth Every 5 Minutes As Needed Up To 3 Doses As Needed 14)  Plavix 75 Mg Tabs (Clopidogrel Bisulfate) .... Take 1 Tablet By Mouth Every Morning. 15)  Slo-Niacin 500 Mg Cr-Tabs (Niacin) .... Take 1 Capsule By Mouth Once Daily 16)  B-100  Tabs (Vitamins-Lipotropics) .... Take 1 Tablet By Mouth Once Daily  Allergies (verified): 1)  ! Tetracycline   ICD Specifications Following MD:  [name], MD     ICD Vendor:  Medtronic     ICD Model Number:  D204DRM     ICD Serial Number:  [id number] ICD DOI:  09/07/2009     ICD Implanting MD:  [name], MD  Lead 1:    Location: RA     DOI: 09/07/2009     Model #: [id number]     Serial #: [id number]     Status: active Lead 2:    Location: RV     DOI: 09/07/2009     Model #: [id number]     Serial #: [id number]     Status: active  Indications::   Aborted cardiac arrest   ICD Follow Up Battery Voltage:  3.19 V     Charge Time:  9.3 seconds     Underlying rhythm:  SR ICD Dependent:  No       ICD Device Measurements Atrium:  Amplitude: 0.5 mV, Impedance: 532 ohms,  Right Ventricle:  Amplitude: 11.3 mV, Impedance: 418 ohms, Threshold: 0.50 V at 0.40 msec Shock Impedance: 39/46 ohms   Episodes MS Episodes:  1     Percent Mode Switch:  100%     Shock:  0     ATP:  0     Nonsustained:  0     Atrial Therapies:  0 Atrial Pacing:  <0.1%     Ventricular Pacing:  45.4%  Brady Parameters Mode MVP     Lower Rate Limit:  60     Upper Rate Limit 130 PAV 180     Sensed AV Delay:  150  Tachy Zones VF:  200     VT:  OFF     VT1:  200-240 (VIA VF)     Next Cardiology Appt Due:  12/13/2009 Tech Comments:  WOUND CHECK--STERI STRIPS REMOVED.  NO REDNESS OR EDEMA.  NORMAL DEVICE FUNCTION.  100% TIME IN AF--+COUMADIN.  NO CHANGES MADE.  CARELINK INSTRUCTIONS WERE GIVEN TO PT AND AWARE OF TRANSMITTER BEING SENT.  PT HAD QUESTIONS-- PT WANTS TO KNOW IF OK TO GO OUT OF TOWN 7-22 THRU 11-13-09.  ALSO, CARDIOVERSION WAS MENTIONED WHILE IN HOSP.  WANTS TO KNOW IF CARDIOVERSION SHOULD BE DONE BEFORE GOING OUT OF TOWN.  PT ALSO WANTED TO MAKE SURE NO DRIVING FOR ? PT TO START CARDIAC REHAB TOMORROW.  PT'S MEMORY IS RETURNING AND PT DOING REAL WELL. ROV 12-13-09 @ 1000 W/SK.  Vella Kohler  September 28, 2009 12:25 PM   Patient Instructions: 1)  Followup appointment with Dr [name] on 12-13-09 @ 1000.  Vella Kohler  September 28, 2009 10:50 AM

## 2010-05-11 NOTE — Cardiovascular Report (Signed)
Summary: Office Visit   Office Visit   Imported By: Roderic Ovens 10/27/2009 15:07:23  _____________________________________________________________________  External Attachment:    Type:   Image     Comment:   External Document

## 2010-05-11 NOTE — Cardiovascular Report (Signed)
Summary: Office Visit Remote   Office Visit Remote   Imported By: Roderic Ovens 04/21/2010 11:57:28  _____________________________________________________________________  External Attachment:    Type:   Image     Comment:   External Document

## 2010-05-11 NOTE — Letter (Signed)
Summary: Remote Device Check  [employer], Main Office  [address]   [place], [place] [address]   Phone: [phone]  Fax: [phone]     April 18, 2010 MRN: [id number]   [facility] [address] [place], [place]  [address]   Dear Mr. Nicolaou,   Your remote transmission was recieved and reviewed by your physician.  All diagnostics were within normal limits for you.  __X___Your next transmission is scheduled for: 06-15-10.  Please transmit at any time this day.  If you have a wireless device your transmission will be sent automatically.   Sincerely,  Vella Kohler

## 2010-05-11 NOTE — Letter (Signed)
Summary: Remote Device Check  [employer], Main Office  [address]   [place], [place] [address]   Phone: [phone]  Fax: [phone]     April 19, 2010 MRN: [id number]   [facility] [address] [place], [place]  [address]   Dear Mr. Vultaggio,   Your remote transmission was recieved and reviewed by your physician.  All diagnostics were within normal limits for you.  __X___Your next transmission is scheduled for: 06-29-10.  Please transmit at any time this day.  If you have a wireless device your transmission will be sent automatically.   Sincerely,  Vella Kohler

## 2010-06-12 ENCOUNTER — Other Ambulatory Visit (HOSPITAL_COMMUNITY): Payer: Self-pay | Admitting: Orthopedic Surgery

## 2010-06-12 DIAGNOSIS — M549 Dorsalgia, unspecified: Secondary | ICD-10-CM

## 2010-06-12 DIAGNOSIS — IMO0002 Reserved for concepts with insufficient information to code with codable children: Secondary | ICD-10-CM

## 2010-06-20 ENCOUNTER — Other Ambulatory Visit (INDEPENDENT_AMBULATORY_CARE_PROVIDER_SITE_OTHER): Payer: Medicare Other

## 2010-06-20 DIAGNOSIS — E789 Disorder of lipoprotein metabolism, unspecified: Secondary | ICD-10-CM

## 2010-06-21 ENCOUNTER — Other Ambulatory Visit: Payer: Self-pay

## 2010-06-22 ENCOUNTER — Ambulatory Visit (HOSPITAL_COMMUNITY)
Admission: RE | Admit: 2010-06-22 | Discharge: 2010-06-22 | Disposition: A | Discharge status: Home / Self Care | Payer: Medicare Other | Source: Ambulatory Visit | Attending: Orthopedic Surgery | Admitting: Orthopedic Surgery

## 2010-06-22 ENCOUNTER — Encounter (HOSPITAL_COMMUNITY)
Admission: RE | Admit: 2010-06-22 | Discharge: 2010-06-22 | Disposition: A | Discharge status: Home / Self Care | Payer: Medicare Other | Source: Ambulatory Visit | Attending: Orthopedic Surgery | Admitting: Orthopedic Surgery

## 2010-06-22 DIAGNOSIS — IMO0002 Reserved for concepts with insufficient information to code with codable children: Secondary | ICD-10-CM

## 2010-06-22 DIAGNOSIS — S22009A Unspecified fracture of unspecified thoracic vertebra, initial encounter for closed fracture: Secondary | ICD-10-CM | POA: Insufficient documentation

## 2010-06-22 DIAGNOSIS — W19XXXA Unspecified fall, initial encounter: Secondary | ICD-10-CM | POA: Insufficient documentation

## 2010-06-22 DIAGNOSIS — M549 Dorsalgia, unspecified: Secondary | ICD-10-CM

## 2010-06-22 LAB — GLUCOSE, CAPILLARY

## 2010-06-22 MED ORDER — TECHNETIUM TC 99M MEDRONATE IV KIT
23.9000 | PACK | Freq: Once | INTRAVENOUS | Status: AC | PRN
  Administered 2010-06-22: 23.9 via INTRAVENOUS

## 2010-06-23 ENCOUNTER — Ambulatory Visit: Payer: Self-pay | Admitting: Cardiology

## 2010-06-23 LAB — CBC
HCT: 29.9 % — ABNORMAL LOW (ref 39.0–52.0)
MCHC: 33.8 g/dL (ref 30.0–36.0)
MCV: 94.3 fL (ref 78.0–100.0)
RDW: 14.5 % (ref 11.5–15.5)

## 2010-06-23 LAB — BASIC METABOLIC PANEL
BUN: 17 mg/dL (ref 6–23)
CO2: 26 mEq/L (ref 19–32)
Chloride: 105 mEq/L (ref 96–112)
Glucose, Bld: 98 mg/dL (ref 70–99)
Potassium: 4 mEq/L (ref 3.5–5.1)

## 2010-06-26 LAB — GLUCOSE, CAPILLARY
Glucose-Capillary: 103 mg/dL — ABNORMAL HIGH (ref 70–99)
Glucose-Capillary: 109 mg/dL — ABNORMAL HIGH (ref 70–99)
Glucose-Capillary: 110 mg/dL — ABNORMAL HIGH (ref 70–99)
Glucose-Capillary: 110 mg/dL — ABNORMAL HIGH (ref 70–99)
Glucose-Capillary: 122 mg/dL — ABNORMAL HIGH (ref 70–99)
Glucose-Capillary: 122 mg/dL — ABNORMAL HIGH (ref 70–99)
Glucose-Capillary: 128 mg/dL — ABNORMAL HIGH (ref 70–99)
Glucose-Capillary: 130 mg/dL — ABNORMAL HIGH (ref 70–99)
Glucose-Capillary: 139 mg/dL — ABNORMAL HIGH (ref 70–99)
Glucose-Capillary: 151 mg/dL — ABNORMAL HIGH (ref 70–99)
Glucose-Capillary: 159 mg/dL — ABNORMAL HIGH (ref 70–99)
Glucose-Capillary: 213 mg/dL — ABNORMAL HIGH (ref 70–99)
Glucose-Capillary: 93 mg/dL (ref 70–99)
Glucose-Capillary: 103 mg/dL — ABNORMAL HIGH (ref 70–99)
Glucose-Capillary: 113 mg/dL — ABNORMAL HIGH (ref 70–99)
Glucose-Capillary: 113 mg/dL — ABNORMAL HIGH (ref 70–99)
Glucose-Capillary: 129 mg/dL — ABNORMAL HIGH (ref 70–99)
Glucose-Capillary: 138 mg/dL — ABNORMAL HIGH (ref 70–99)
Glucose-Capillary: 139 mg/dL — ABNORMAL HIGH (ref 70–99)
Glucose-Capillary: 142 mg/dL — ABNORMAL HIGH (ref 70–99)
Glucose-Capillary: 144 mg/dL — ABNORMAL HIGH (ref 70–99)
Glucose-Capillary: 145 mg/dL — ABNORMAL HIGH (ref 70–99)
Glucose-Capillary: 149 mg/dL — ABNORMAL HIGH (ref 70–99)
Glucose-Capillary: 154 mg/dL — ABNORMAL HIGH (ref 70–99)
Glucose-Capillary: 158 mg/dL — ABNORMAL HIGH (ref 70–99)
Glucose-Capillary: 159 mg/dL — ABNORMAL HIGH (ref 70–99)
Glucose-Capillary: 165 mg/dL — ABNORMAL HIGH (ref 70–99)
Glucose-Capillary: 167 mg/dL — ABNORMAL HIGH (ref 70–99)
Glucose-Capillary: 175 mg/dL — ABNORMAL HIGH (ref 70–99)
Glucose-Capillary: 190 mg/dL — ABNORMAL HIGH (ref 70–99)
Glucose-Capillary: 190 mg/dL — ABNORMAL HIGH (ref 70–99)
Glucose-Capillary: 214 mg/dL — ABNORMAL HIGH (ref 70–99)
Glucose-Capillary: 227 mg/dL — ABNORMAL HIGH (ref 70–99)
Glucose-Capillary: 257 mg/dL — ABNORMAL HIGH (ref 70–99)
Glucose-Capillary: 91 mg/dL (ref 70–99)
Glucose-Capillary: 91 mg/dL (ref 70–99)
Glucose-Capillary: 93 mg/dL (ref 70–99)
Glucose-Capillary: 94 mg/dL (ref 70–99)
Glucose-Capillary: 96 mg/dL (ref 70–99)

## 2010-06-26 LAB — COMPREHENSIVE METABOLIC PANEL
ALT: 51 U/L (ref 0–53)
ALT: 52 U/L (ref 0–53)
ALT: 66 U/L — ABNORMAL HIGH (ref 0–53)
AST: 163 U/L — ABNORMAL HIGH (ref 0–37)
AST: 79 U/L — ABNORMAL HIGH (ref 0–37)
Albumin: 2.9 g/dL — ABNORMAL LOW (ref 3.5–5.2)
Albumin: 3 g/dL — ABNORMAL LOW (ref 3.5–5.2)
Albumin: 3.2 g/dL — ABNORMAL LOW (ref 3.5–5.2)
Albumin: 3.6 g/dL (ref 3.5–5.2)
Alkaline Phosphatase: 56 U/L (ref 39–117)
BUN: 41 mg/dL — ABNORMAL HIGH (ref 6–23)
BUN: 42 mg/dL — ABNORMAL HIGH (ref 6–23)
CO2: 19 mEq/L (ref 19–32)
Calcium: 8.2 mg/dL — ABNORMAL LOW (ref 8.4–10.5)
Calcium: 9.2 mg/dL (ref 8.4–10.5)
Chloride: 114 mEq/L — ABNORMAL HIGH (ref 96–112)
Chloride: 115 mEq/L — ABNORMAL HIGH (ref 96–112)
Chloride: 118 mEq/L — ABNORMAL HIGH (ref 96–112)
Creatinine, Ser: 0.9 mg/dL (ref 0.4–1.5)
Creatinine, Ser: 1.14 mg/dL (ref 0.4–1.5)
GFR calc Af Amer: 58 mL/min — ABNORMAL LOW (ref >60)
GFR calc Af Amer: >60 mL/min (ref >60)
GFR calc non Af Amer: >60 mL/min (ref >60)
Glucose, Bld: 112 mg/dL — ABNORMAL HIGH (ref 70–99)
Glucose, Bld: 143 mg/dL — ABNORMAL HIGH (ref 70–99)
Potassium: 3.3 mEq/L — ABNORMAL LOW (ref 3.5–5.1)
Sodium: 138 mEq/L (ref 135–145)
Sodium: 140 mEq/L (ref 135–145)
Sodium: 146 mEq/L — ABNORMAL HIGH (ref 135–145)
Total Bilirubin: 0.8 mg/dL (ref 0.3–1.2)
Total Bilirubin: 1.5 mg/dL — ABNORMAL HIGH (ref 0.3–1.2)
Total Bilirubin: 1.6 mg/dL — ABNORMAL HIGH (ref 0.3–1.2)
Total Protein: 5.5 g/dL — ABNORMAL LOW (ref 6.0–8.3)
Total Protein: 6 g/dL (ref 6.0–8.3)
Total Protein: 6 g/dL (ref 6.0–8.3)

## 2010-06-26 LAB — HEPARIN LEVEL (UNFRACTIONATED)
Heparin Unfractionated: 0.1 IU/mL — ABNORMAL LOW (ref 0.30–0.70)
Heparin Unfractionated: 0.28 IU/mL — ABNORMAL LOW (ref 0.30–0.70)
Heparin Unfractionated: 0.31 IU/mL (ref 0.30–0.70)
Heparin Unfractionated: 0.31 IU/mL (ref 0.30–0.70)
Heparin Unfractionated: 0.33 IU/mL (ref 0.30–0.70)
Heparin Unfractionated: 0.53 IU/mL (ref 0.30–0.70)
Heparin Unfractionated: 0.57 IU/mL (ref 0.30–0.70)
Heparin Unfractionated: 0.58 IU/mL (ref 0.30–0.70)

## 2010-06-26 LAB — CBC
HCT: 35.5 % — ABNORMAL LOW (ref 39.0–52.0)
HCT: 39.1 % (ref 39.0–52.0)
HCT: 41.2 % (ref 39.0–52.0)
HCT: 33.3 % — ABNORMAL LOW (ref 39.0–52.0)
HCT: 34.2 % — ABNORMAL LOW (ref 39.0–52.0)
HCT: 34.2 % — ABNORMAL LOW (ref 39.0–52.0)
HCT: 34.5 % — ABNORMAL LOW (ref 39.0–52.0)
HCT: 35 % — ABNORMAL LOW (ref 39.0–52.0)
Hemoglobin: 13.5 g/dL (ref 13.0–17.0)
Hemoglobin: 11.4 g/dL — ABNORMAL LOW (ref 13.0–17.0)
Hemoglobin: 11.9 g/dL — ABNORMAL LOW (ref 13.0–17.0)
Hemoglobin: 12.1 g/dL — ABNORMAL LOW (ref 13.0–17.0)
Hemoglobin: 13.7 g/dL (ref 13.0–17.0)
MCHC: 32.9 g/dL (ref 30.0–36.0)
MCHC: 34.5 g/dL (ref 30.0–36.0)
MCHC: 34.5 g/dL (ref 30.0–36.0)
MCHC: 33.9 g/dL (ref 30.0–36.0)
MCHC: 33.9 g/dL (ref 30.0–36.0)
MCHC: 34.2 g/dL (ref 30.0–36.0)
MCHC: 34.3 g/dL (ref 30.0–36.0)
MCHC: 34.3 g/dL (ref 30.0–36.0)
MCHC: 34.3 g/dL (ref 30.0–36.0)
MCHC: 34.8 g/dL (ref 30.0–36.0)
MCHC: 35.1 g/dL (ref 30.0–36.0)
MCV: 97.2 fL (ref 78.0–100.0)
MCV: 97.8 fL (ref 78.0–100.0)
MCV: 96.7 fL (ref 78.0–100.0)
MCV: 96.9 fL (ref 78.0–100.0)
MCV: 97.1 fL (ref 78.0–100.0)
MCV: 97.4 fL (ref 78.0–100.0)
MCV: 97.5 fL (ref 78.0–100.0)
MCV: 97.7 fL (ref 78.0–100.0)
MCV: 97.8 fL (ref 78.0–100.0)
Platelets: 136 10*3/uL — ABNORMAL LOW (ref 150–400)
Platelets: 147 10*3/uL — ABNORMAL LOW (ref 150–400)
Platelets: 161 10*3/uL (ref 150–400)
Platelets: 198 10*3/uL (ref 150–400)
Platelets: 101 10*3/uL — ABNORMAL LOW (ref 150–400)
Platelets: 138 10*3/uL — ABNORMAL LOW (ref 150–400)
Platelets: 85 10*3/uL — ABNORMAL LOW (ref 150–400)
RBC: 3.92 MIL/uL — ABNORMAL LOW (ref 4.22–5.81)
RBC: 4.04 MIL/uL — ABNORMAL LOW (ref 4.22–5.81)
RBC: 3.4 MIL/uL — ABNORMAL LOW (ref 4.22–5.81)
RBC: 3.52 MIL/uL — ABNORMAL LOW (ref 4.22–5.81)
RBC: 3.57 MIL/uL — ABNORMAL LOW (ref 4.22–5.81)
RBC: 3.58 MIL/uL — ABNORMAL LOW (ref 4.22–5.81)
RBC: 3.91 MIL/uL — ABNORMAL LOW (ref 4.22–5.81)
RBC: 4.04 MIL/uL — ABNORMAL LOW (ref 4.22–5.81)
RBC: 4.19 MIL/uL — ABNORMAL LOW (ref 4.22–5.81)
RBC: 4.59 MIL/uL (ref 4.22–5.81)
RDW: 14.1 % (ref 11.5–15.5)
RDW: 14.2 % (ref 11.5–15.5)
RDW: 13.3 % (ref 11.5–15.5)
RDW: 13.6 % (ref 11.5–15.5)
RDW: 13.9 % (ref 11.5–15.5)
RDW: 14.3 % (ref 11.5–15.5)
WBC: 12.9 10*3/uL — ABNORMAL HIGH (ref 4.0–10.5)
WBC: 6.7 10*3/uL (ref 4.0–10.5)
WBC: 13.2 10*3/uL — ABNORMAL HIGH (ref 4.0–10.5)
WBC: 15.2 10*3/uL — ABNORMAL HIGH (ref 4.0–10.5)
WBC: 9.5 10*3/uL (ref 4.0–10.5)

## 2010-06-26 LAB — BASIC METABOLIC PANEL
BUN: 23 mg/dL (ref 6–23)
BUN: 29 mg/dL — ABNORMAL HIGH (ref 6–23)
BUN: 19 mg/dL (ref 6–23)
BUN: 19 mg/dL (ref 6–23)
BUN: 20 mg/dL (ref 6–23)
BUN: 21 mg/dL (ref 6–23)
BUN: 22 mg/dL (ref 6–23)
BUN: 35 mg/dL — ABNORMAL HIGH (ref 6–23)
CO2: 24 mEq/L (ref 19–32)
CO2: 14 mEq/L — ABNORMAL LOW (ref 19–32)
CO2: 15 mEq/L — ABNORMAL LOW (ref 19–32)
CO2: 17 mEq/L — ABNORMAL LOW (ref 19–32)
CO2: 21 mEq/L (ref 19–32)
CO2: 23 mEq/L (ref 19–32)
CO2: 27 mEq/L (ref 19–32)
CO2: 28 mEq/L (ref 19–32)
CO2: 29 mEq/L (ref 19–32)
Calcium: 9.7 mg/dL (ref 8.4–10.5)
Calcium: 10 mg/dL (ref 8.4–10.5)
Calcium: 8 mg/dL — ABNORMAL LOW (ref 8.4–10.5)
Calcium: 8.1 mg/dL — ABNORMAL LOW (ref 8.4–10.5)
Calcium: 8.1 mg/dL — ABNORMAL LOW (ref 8.4–10.5)
Calcium: 8.2 mg/dL — ABNORMAL LOW (ref 8.4–10.5)
Calcium: 8.3 mg/dL — ABNORMAL LOW (ref 8.4–10.5)
Calcium: 8.4 mg/dL (ref 8.4–10.5)
Calcium: 8.6 mg/dL (ref 8.4–10.5)
Calcium: 9.6 mg/dL (ref 8.4–10.5)
Chloride: 106 mEq/L (ref 96–112)
Chloride: 111 mEq/L (ref 96–112)
Chloride: 111 mEq/L (ref 96–112)
Chloride: 111 mEq/L (ref 96–112)
Chloride: 112 mEq/L (ref 96–112)
Chloride: 113 mEq/L — ABNORMAL HIGH (ref 96–112)
Chloride: 116 mEq/L — ABNORMAL HIGH (ref 96–112)
Chloride: 117 mEq/L — ABNORMAL HIGH (ref 96–112)
Creatinine, Ser: 0.78 mg/dL (ref 0.4–1.5)
Creatinine, Ser: 0.92 mg/dL (ref 0.4–1.5)
Creatinine, Ser: 0.79 mg/dL (ref 0.4–1.5)
Creatinine, Ser: 0.81 mg/dL (ref 0.4–1.5)
Creatinine, Ser: 0.95 mg/dL (ref 0.4–1.5)
Creatinine, Ser: 0.97 mg/dL (ref 0.4–1.5)
Creatinine, Ser: 0.98 mg/dL (ref 0.4–1.5)
Creatinine, Ser: 1.12 mg/dL (ref 0.4–1.5)
Creatinine, Ser: 1.13 mg/dL (ref 0.4–1.5)
Creatinine, Ser: 1.16 mg/dL (ref 0.4–1.5)
GFR calc Af Amer: >60 mL/min (ref >60)
GFR calc Af Amer: >60 mL/min (ref >60)
GFR calc Af Amer: >60 mL/min (ref >60)
GFR calc Af Amer: >60 mL/min (ref >60)
GFR calc Af Amer: >60 mL/min (ref >60)
GFR calc Af Amer: >60 mL/min (ref >60)
GFR calc Af Amer: >60 mL/min (ref >60)
GFR calc Af Amer: >60 mL/min (ref >60)
GFR calc Af Amer: >60 mL/min (ref >60)
GFR calc Af Amer: >60 mL/min (ref >60)
GFR calc Af Amer: >60 mL/min (ref >60)
GFR calc non Af Amer: >60 mL/min (ref >60)
GFR calc non Af Amer: 57 mL/min — ABNORMAL LOW (ref >60)
GFR calc non Af Amer: >60 mL/min (ref >60)
GFR calc non Af Amer: >60 mL/min (ref >60)
GFR calc non Af Amer: >60 mL/min (ref >60)
GFR calc non Af Amer: >60 mL/min (ref >60)
GFR calc non Af Amer: >60 mL/min (ref >60)
GFR calc non Af Amer: >60 mL/min (ref >60)
GFR calc non Af Amer: >60 mL/min (ref >60)
GFR calc non Af Amer: >60 mL/min (ref >60)
GFR calc non Af Amer: >60 mL/min (ref >60)
Glucose, Bld: 119 mg/dL — ABNORMAL HIGH (ref 70–99)
Glucose, Bld: 126 mg/dL — ABNORMAL HIGH (ref 70–99)
Glucose, Bld: 154 mg/dL — ABNORMAL HIGH (ref 70–99)
Glucose, Bld: 199 mg/dL — ABNORMAL HIGH (ref 70–99)
Glucose, Bld: 219 mg/dL — ABNORMAL HIGH (ref 70–99)
Glucose, Bld: 241 mg/dL — ABNORMAL HIGH (ref 70–99)
Potassium: 4 mEq/L (ref 3.5–5.1)
Potassium: 3.2 mEq/L — ABNORMAL LOW (ref 3.5–5.1)
Potassium: 3.2 mEq/L — ABNORMAL LOW (ref 3.5–5.1)
Potassium: 3.6 mEq/L (ref 3.5–5.1)
Potassium: 3.8 mEq/L (ref 3.5–5.1)
Potassium: 4.2 mEq/L (ref 3.5–5.1)
Potassium: 4.2 mEq/L (ref 3.5–5.1)
Sodium: 136 mEq/L (ref 135–145)
Sodium: 137 mEq/L (ref 135–145)
Sodium: 137 mEq/L (ref 135–145)
Sodium: 138 mEq/L (ref 135–145)
Sodium: 139 mEq/L (ref 135–145)
Sodium: 139 mEq/L (ref 135–145)
Sodium: 142 mEq/L (ref 135–145)
Sodium: 142 mEq/L (ref 135–145)
Sodium: 145 mEq/L (ref 135–145)

## 2010-06-26 LAB — URINALYSIS, ROUTINE W REFLEX MICROSCOPIC
Bilirubin Urine: NEGATIVE
Glucose, UA: NEGATIVE mg/dL
Glucose, UA: NEGATIVE mg/dL
Ketones, ur: NEGATIVE mg/dL
Ketones, ur: NEGATIVE mg/dL
Leukocytes, UA: NEGATIVE
Nitrite: NEGATIVE
Protein, ur: >300 mg/dL — AB
Specific Gravity, Urine: 1.016 (ref 1.005–1.030)
pH: 5 (ref 5.0–8.0)

## 2010-06-26 LAB — POCT CARDIAC MARKERS
CKMB, poc: 3.7 ng/mL (ref 1.0–8.0)
Myoglobin, poc: 414 ng/mL (ref 12–200)
Troponin i, poc: <0.05 ng/mL (ref 0.00–0.09)

## 2010-06-26 LAB — PROTIME-INR
INR: 1.24 (ref 0.00–1.49)
INR: 1.27 (ref 0.00–1.49)
INR: 1.47 (ref 0.00–1.49)
INR: 2.07 — ABNORMAL HIGH (ref 0.00–1.49)
INR: 1.27 (ref 0.00–1.49)
INR: 1.33 (ref 0.00–1.49)
Prothrombin Time: 15.5 seconds — ABNORMAL HIGH (ref 11.6–15.2)
Prothrombin Time: 17.7 seconds — ABNORMAL HIGH (ref 11.6–15.2)
Prothrombin Time: 23.1 seconds — ABNORMAL HIGH (ref 11.6–15.2)
Prothrombin Time: 27.1 seconds — ABNORMAL HIGH (ref 11.6–15.2)
Prothrombin Time: 15.8 seconds — ABNORMAL HIGH (ref 11.6–15.2)
Prothrombin Time: 15.8 seconds — ABNORMAL HIGH (ref 11.6–15.2)

## 2010-06-26 LAB — CULTURE, BLOOD (ROUTINE X 2): Culture: NO GROWTH

## 2010-06-26 LAB — CARDIAC PANEL(CRET KIN+CKTOT+MB+TROPI)
CK, MB: 339.5 ng/mL (ref 0.3–4.0)
Relative Index: 18.8 — ABNORMAL HIGH (ref 0.0–2.5)
Relative Index: 22.6 — ABNORMAL HIGH (ref 0.0–2.5)
Total CK: 1629 U/L — ABNORMAL HIGH (ref 7–232)
Total CK: 1645 U/L — ABNORMAL HIGH (ref 7–232)
Total CK: 587 U/L — ABNORMAL HIGH (ref 7–232)
Troponin I: 13.39 ng/mL (ref 0.00–0.06)
Troponin I: 6.1 ng/mL (ref 0.00–0.06)

## 2010-06-26 LAB — BLOOD GAS, ARTERIAL
Acid-base deficit: 8.4 mmol/L — ABNORMAL HIGH (ref 0.0–2.0)
Acid-base deficit: 8.8 mmol/L — ABNORMAL HIGH (ref 0.0–2.0)
Bicarbonate: 15.2 mEq/L — ABNORMAL LOW (ref 20.0–24.0)
Bicarbonate: 17.2 mEq/L — ABNORMAL LOW (ref 20.0–24.0)
Bicarbonate: 20.4 mEq/L (ref 20.0–24.0)
FIO2: 0.4 %
FIO2: 0.4 %
MECHVT: 530 mL
MECHVT: 530 mL
Patient temperature: 98.6
TCO2: 15.5 mmol/L (ref 0–100)
TCO2: 16 mmol/L (ref 0–100)
TCO2: 18 mmol/L (ref 0–100)
TCO2: 21.4 mmol/L (ref 0–100)
pCO2 arterial: 21.3 mmHg — ABNORMAL LOW (ref 35.0–45.0)
pCO2 arterial: 25.6 mmHg — ABNORMAL LOW (ref 35.0–45.0)
pCO2 arterial: 27.2 mmHg — ABNORMAL LOW (ref 35.0–45.0)
pCO2 arterial: 30.6 mmHg — ABNORMAL LOW (ref 35.0–45.0)
pH, Arterial: 7.391 (ref 7.350–7.450)
pH, Arterial: 7.418 (ref 7.350–7.450)
pH, Arterial: 7.44 (ref 7.350–7.450)
pO2, Arterial: 136 mmHg — ABNORMAL HIGH (ref 80.0–100.0)
pO2, Arterial: 73.2 mmHg — ABNORMAL LOW (ref 80.0–100.0)

## 2010-06-26 LAB — URINE MICROSCOPIC-ADD ON

## 2010-06-26 LAB — POCT I-STAT, CHEM 8
Chloride: 109 mEq/L (ref 96–112)
HCT: 40 % (ref 39.0–52.0)
Hemoglobin: 13.6 g/dL (ref 13.0–17.0)
Potassium: 3.2 mEq/L — ABNORMAL LOW (ref 3.5–5.1)
Sodium: 138 mEq/L (ref 135–145)

## 2010-06-26 LAB — CULTURE, BAL-QUANTITATIVE W GRAM STAIN: Culture: NO GROWTH

## 2010-06-26 LAB — URINE CULTURE: Colony Count: 15000

## 2010-06-26 LAB — POCT I-STAT 3, ART BLOOD GAS (G3+)
Acid-base deficit: 8 mmol/L — ABNORMAL HIGH (ref 0.0–2.0)
O2 Saturation: 96 %
Patient temperature: 36.3
pO2, Arterial: 87 mmHg (ref 80.0–100.0)

## 2010-06-26 LAB — DIFFERENTIAL
Basophils Absolute: 0 10*3/uL (ref 0.0–0.1)
Basophils Relative: 0 % (ref 0–1)
Eosinophils Absolute: 0.3 10*3/uL (ref 0.0–0.7)
Eosinophils Absolute: 0.1 10*3/uL (ref 0.0–0.7)
Eosinophils Relative: 1 % (ref 0–5)
Lymphs Abs: 1.6 10*3/uL (ref 0.7–4.0)
Monocytes Relative: 6 % (ref 3–12)
Neutrophils Relative %: 79 % — ABNORMAL HIGH (ref 43–77)

## 2010-06-26 LAB — PHOSPHORUS: Phosphorus: <1 mg/dL — CL (ref 2.3–4.6)

## 2010-06-26 LAB — LIPID PANEL
Cholesterol: 144 mg/dL (ref 0–200)
Total CHOL/HDL Ratio: 3 RATIO
VLDL: 16 mg/dL (ref 0–40)

## 2010-06-26 LAB — BRAIN NATRIURETIC PEPTIDE: Pro B Natriuretic peptide (BNP): 1180 pg/mL — ABNORMAL HIGH (ref 0.0–100.0)

## 2010-06-26 LAB — MAGNESIUM: Magnesium: 1.9 mg/dL (ref 1.5–2.5)

## 2010-06-27 ENCOUNTER — Telehealth: Payer: Self-pay | Admitting: *Deleted

## 2010-06-27 NOTE — Telephone Encounter (Signed)
PATIENT PHONED WANTING TO KNOW IF OK TO HOLD PLAVIX 7 DAYS PRIOR TO UPCOMING SURGERY. ADVISED OK PER DR BRACKBILL

## 2010-06-28 LAB — POCT I-STAT 3, VENOUS BLOOD GAS (G3P V)
Bicarbonate: 24.4 mEq/L — ABNORMAL HIGH (ref 20.0–24.0)
TCO2: 26 mmol/L (ref 0–100)
pCO2, Ven: 43 mmHg — ABNORMAL LOW (ref 45.0–50.0)
pH, Ven: 7.363 — ABNORMAL HIGH (ref 7.250–7.300)

## 2010-06-28 LAB — POCT I-STAT 3, ART BLOOD GAS (G3+)
TCO2: 27 mmol/L (ref 0–100)
pCO2 arterial: 40.9 mmHg (ref 35.0–45.0)
pH, Arterial: 7.408 (ref 7.350–7.450)

## 2010-06-29 ENCOUNTER — Ambulatory Visit (INDEPENDENT_AMBULATORY_CARE_PROVIDER_SITE_OTHER): Payer: Medicare Other | Admitting: *Deleted

## 2010-06-29 DIAGNOSIS — I4891 Unspecified atrial fibrillation: Secondary | ICD-10-CM

## 2010-06-30 ENCOUNTER — Other Ambulatory Visit: Payer: Self-pay

## 2010-07-05 NOTE — Progress Notes (Signed)
Remote ICD check  

## 2010-07-09 ENCOUNTER — Encounter: Payer: Self-pay | Admitting: *Deleted

## 2010-07-10 ENCOUNTER — Ambulatory Visit (INDEPENDENT_AMBULATORY_CARE_PROVIDER_SITE_OTHER): Payer: Medicare Other | Admitting: Cardiology

## 2010-07-10 ENCOUNTER — Encounter: Payer: Self-pay | Admitting: Cardiology

## 2010-07-10 ENCOUNTER — Encounter (HOSPITAL_COMMUNITY)
Admission: RE | Admit: 2010-07-10 | Discharge: 2010-07-10 | Disposition: A | Discharge status: Home / Self Care | Payer: Medicare Other | Source: Ambulatory Visit | Attending: Orthopedic Surgery | Admitting: Orthopedic Surgery

## 2010-07-10 DIAGNOSIS — M549 Dorsalgia, unspecified: Secondary | ICD-10-CM

## 2010-07-10 DIAGNOSIS — I2589 Other forms of chronic ischemic heart disease: Secondary | ICD-10-CM

## 2010-07-10 DIAGNOSIS — I4891 Unspecified atrial fibrillation: Secondary | ICD-10-CM

## 2010-07-10 DIAGNOSIS — E7800 Pure hypercholesterolemia, unspecified: Secondary | ICD-10-CM

## 2010-07-10 DIAGNOSIS — I119 Hypertensive heart disease without heart failure: Secondary | ICD-10-CM | POA: Insufficient documentation

## 2010-07-10 DIAGNOSIS — Z95810 Presence of automatic (implantable) cardiac defibrillator: Secondary | ICD-10-CM

## 2010-07-10 DIAGNOSIS — Z951 Presence of aortocoronary bypass graft: Secondary | ICD-10-CM | POA: Insufficient documentation

## 2010-07-10 LAB — COMPREHENSIVE METABOLIC PANEL
BUN: 19 mg/dL (ref 6–23)
Creatinine, Ser: 1.11 mg/dL (ref 0.4–1.5)
GFR calc Af Amer: >60 mL/min (ref >60)
GFR calc non Af Amer: >60 mL/min (ref >60)
Glucose, Bld: 114 mg/dL — ABNORMAL HIGH (ref 70–99)
Potassium: 3.9 mEq/L (ref 3.5–5.1)
Sodium: 145 mEq/L (ref 135–145)
Total Bilirubin: 0.4 mg/dL (ref 0.3–1.2)

## 2010-07-10 LAB — CBC
HCT: 40.2 % (ref 39.0–52.0)
Hemoglobin: 13.7 g/dL (ref 13.0–17.0)
MCV: 93.1 fL (ref 78.0–100.0)
RBC: 4.32 MIL/uL (ref 4.22–5.81)
WBC: 6.6 10*3/uL (ref 4.0–10.5)

## 2010-07-10 LAB — TYPE AND SCREEN
ABO/RH(D): O NEG
Antibody Screen: NEGATIVE

## 2010-07-10 LAB — DIFFERENTIAL
Lymphocytes Relative: 26 % (ref 12–46)
Lymphs Abs: 1.7 10*3/uL (ref 0.7–4.0)
Neutrophils Relative %: 64 % (ref 43–77)

## 2010-07-10 LAB — APTT: aPTT: 27 seconds (ref 24–37)

## 2010-07-10 NOTE — Assessment & Plan Note (Signed)
The patient has not been expressing any angina pectoris or chest pain.

## 2010-07-10 NOTE — Progress Notes (Signed)
History of Present Illness: This pleasant 77 year old gentleman has a history of known ischemic heart disease.  He has a history of coronary artery bypass graft surgery in 1982.  He had a subsequent redo coronary artery bypass graft surgery in 1994.  He had stents to his right coronary artery in 2005.  In 2007 he had stenting of the vein graft to the obtuse marginal.  On 08/29/09 he had a witnessed ventricular fibrillation arrest while playing tennis at his club.Was treated successfully with the Arctic sun protocol and made a excellent functional recovery.  He had his last cardiac catheterization on 09/06/09 and there were no lesions amenable to catheter-based intervention.  He subsequently had a Medtronic ICD placed by Dr. [name].  He went home in atrial fibrillation and had cardioversion in August 2011 and has maintained sinus rhythm since then and he is no longer on Coumadin.  In the autumn of 2011 he had a tragic accident when a large limb fell from a tree and struck him across the back of the neck and fracture 3 vertebra.  He has had disabling back pain since then.  He is scheduled to have kyphoplasty later this week on T8.  Current Outpatient Prescriptions  Medication Sig Dispense Refill  . aspirin 81 MG tablet Take 81 mg by mouth daily.        . clopidogrel (PLAVIX) 75 MG tablet Take 75 mg by mouth daily.        . diazepam (VALIUM) 5 MG tablet Take 5 mg by mouth every 6 (six) hours as needed.        . furosemide (LASIX) 40 MG tablet Take 40 mg by mouth daily.        Marland Kitchen HYDROcodone-acetaminophen (NORCO) 5-325 MG per tablet Take 1 tablet by mouth as directed.        . isosorbide mononitrate (IMDUR) 60 MG 24 hr tablet Take 60 mg by mouth daily.        Marland Kitchen lisinopril (PRINIVIL,ZESTRIL) 20 MG tablet Take 20 mg by mouth daily.        . metoprolol tartrate (LOPRESSOR) 25 MG tablet Take 25 mg by mouth 2 (two) times daily. Taking 100mg  in the am and 75mg  in the pm       . Multiple Vitamin (MULTIVITAMIN)  tablet Take 1 tablet by mouth daily.        . niacin (SLO-NIACIN) 500 MG tablet Take 500 mg by mouth at bedtime.        . nitroGLYCERIN (NITROSTAT) 0.4 MG SL tablet Place 0.4 mg under the tongue every 5 (five) minutes as needed.        . potassium chloride (K-DUR,KLOR-CON) 10 MEQ tablet Take 10 mEq by mouth 2 (two) times daily.        . rosuvastatin (CRESTOR) 20 MG tablet Take 20 mg by mouth daily.        Marland Kitchen senna (SENOKOT) 8.6 MG tablet Take 1 tablet by mouth daily.        . traMADol (ULTRAM) 50 MG tablet Take 50 mg by mouth every 6 (six) hours as needed.        . Coenzyme Q10 10 MG capsule Take 10 mg by mouth daily.          Allergies  Allergen Reactions  . Cyclobenzaprine Hcl   . Tetracycline     Patient Active Problem List  Diagnoses  . Atrial fibrillation  . AUTOMATIC IMPLANTABLE CARDIAC DEFIBRILLATOR SITU  . Back pain  . Hypercholesteremia  .  Benign hypertensive heart disease without heart failure  . S/P CABG (coronary artery bypass graft)    History  Smoking status  . Never Smoker   Smokeless tobacco  . Not on file    History  Alcohol Use: Not on file    No family history on file.  Review of Systems: Constitutional: no fever chills diaphoresis or fatigue or change in weight.  Head and neck: no hearing loss, no epistaxis, no photophobia or visual disturbance. Respiratory: No cough, shortness of breath or wheezing. Cardiovascular: No chest pain peripheral edema, palpitations. Gastrointestinal: No abdominal distention, no abdominal pain, no change in bowel habits hematochezia or melena. Genitourinary: No dysuria, no frequency, no urgency, no nocturia. Musculoskeletal:No arthralgias, no back pain, no gait disturbance or myalgias. Neurological: No dizziness, no headaches, no numbness, no seizures, no syncope, no weakness, no tremors. Hematologic: No lymphadenopathy, no easy bruising. Psychiatric: No confusion, no hallucinations, no sleep disturbance.    Physical  Exam: Filed Vitals:   07/10/10 1058  BP: 124/70  Pulse: 64  Weight is 148.  The general appearance reveals a well-developed well-nourished alert gentleman in no distress.Pupils equal and reactive.   Extraocular Movements are full.  There is no scleral icterus.  The mouth and pharynx are normal.  The neck is supple.  The carotids reveal no bruits.  The jugular venous pressure is normal.  The thyroid is not enlarged.  There is no lymphadenopathy.The chest is clear to percussion and auscultation. There are no rales or rhonchi. Expansion of the chest is symmetrical.The precordium is quiet.  The first heart sound is normal.  The second heart sound is physiologically split.  There is no gallop rub or click.There is a grade 2/6 systolic ejection murmur of aortic stenosis.  There is no abnormal lift or heave.The abdomen is soft and nontender. Bowel sounds are normal. The liver and spleen are not enlarged. There Are no abdominal masses. There are no bruits.The pedal pulses are good.  There is no phlebitis or edema.  There is no cyanosis or clubbing.The skin is warm and dry.  There is no rash.Strength is normal and symmetrical in all extremities.  There is no lateralizing weakness.  There are no sensory deficits.   Assessment / Plan: Continue present cardiac meds.  He will resume Plavix and aspirin following his upcoming kyphoplasty later this week.  Recheck in 4 months for followup office visit and lab work

## 2010-07-10 NOTE — Assessment & Plan Note (Signed)
The patient is scheduled to undergo kyphoplasty for his compression fracture of T8 on Thursday April 5.He is holding his aspirin and Plavix for 5 days prior to surgery.

## 2010-07-10 NOTE — Assessment & Plan Note (Signed)
The patient has not had any shocks from his defibrillator.

## 2010-07-10 NOTE — Assessment & Plan Note (Signed)
The patient has been remaining in normal sinus rhythm.  He is no longer on Coumadin.  He has not been aware of any palpitations

## 2010-07-10 NOTE — Assessment & Plan Note (Signed)
Because of his back pain he has not been able to exercise and as a result his lipids are not as good and his weight is going up.

## 2010-07-11 ENCOUNTER — Telehealth: Payer: Self-pay | Admitting: *Deleted

## 2010-07-11 NOTE — Telephone Encounter (Signed)
Received request for peri-operative device programming.  Pt scheduled for T8 kyphoplasty on 07-13-10.  Since pt will be prone during procedure, tachy therapies should be disabled and then resumed when procedure over.  Form faxed back to short stay at [phone].  Since device will be reprogrammed in PACU, no need for patient to be seen in office other than regularly scheduled visits.  Medtronic rep notified of upcoming procedure.

## 2010-07-12 ENCOUNTER — Telehealth: Payer: Self-pay | Admitting: Cardiology

## 2010-07-12 NOTE — Telephone Encounter (Signed)
LOV,12 lead faxed to [facility] @ [phone] 07/12/10/KM

## 2010-07-13 ENCOUNTER — Ambulatory Visit (HOSPITAL_COMMUNITY)
Admission: RE | Admit: 2010-07-13 | Discharge: 2010-07-13 | Disposition: A | Discharge status: Home / Self Care | Payer: Medicare Other | Source: Ambulatory Visit | Attending: Orthopedic Surgery | Admitting: Orthopedic Surgery

## 2010-07-13 ENCOUNTER — Ambulatory Visit (HOSPITAL_COMMUNITY): Payer: Medicare Other

## 2010-07-13 DIAGNOSIS — Y92009 Unspecified place in unspecified non-institutional (private) residence as the place of occurrence of the external cause: Secondary | ICD-10-CM | POA: Insufficient documentation

## 2010-07-13 DIAGNOSIS — W208XXA Other cause of strike by thrown, projected or falling object, initial encounter: Secondary | ICD-10-CM | POA: Insufficient documentation

## 2010-07-13 DIAGNOSIS — S22009A Unspecified fracture of unspecified thoracic vertebra, initial encounter for closed fracture: Secondary | ICD-10-CM | POA: Insufficient documentation

## 2010-07-13 DIAGNOSIS — Z01812 Encounter for preprocedural laboratory examination: Secondary | ICD-10-CM | POA: Insufficient documentation

## 2010-07-13 LAB — URINALYSIS, ROUTINE W REFLEX MICROSCOPIC
Bilirubin Urine: NEGATIVE
Glucose, UA: NEGATIVE mg/dL
Hgb urine dipstick: NEGATIVE
Ketones, ur: NEGATIVE mg/dL
pH: 6 (ref 5.0–8.0)

## 2010-07-17 LAB — POCT I-STAT, CHEM 8
Glucose, Bld: 107 mg/dL — ABNORMAL HIGH (ref 70–99)
HCT: 39 % (ref 39.0–52.0)
Hemoglobin: 13.3 g/dL (ref 13.0–17.0)
Potassium: 4.1 mEq/L (ref 3.5–5.1)
Sodium: 138 mEq/L (ref 135–145)

## 2010-07-17 LAB — POCT I-STAT 4, (NA,K, GLUC, HGB,HCT)
Glucose, Bld: 104 mg/dL — ABNORMAL HIGH (ref 70–99)
HCT: 39 % (ref 39.0–52.0)

## 2010-07-18 NOTE — Op Note (Signed)
[name], [name]                ACCOUNT NO.:  [id number]  MEDICAL RECORD NO.:  [id number]           PATIENT TYPE:  O  LOCATION:  [facility]                         FACILITY:  [facility]  PHYSICIAN:  Estill Bamberg, MD      DATE OF BIRTH:  1933/12/14  DATE OF PROCEDURE:  07/13/2010 DATE OF DISCHARGE:  07/13/2010                              OPERATIVE REPORT   PREOPERATIVE DIAGNOSIS:  T8 compression fracture.  POSTOPERATIVE DIAGNOSIS:  T8 compression fracture.  PROCEDURE:  T8 kyphoplasty.  SURGEON:  Estill Bamberg, MD  ASSISTANT:  None.  ANESTHESIA:  General endotracheal anesthesia.  COMPLICATIONS:  None.  DISPOSITION:  Stable.  INDICATIONS FOR PROCEDURE:  Briefly, [name] is a extremely pleasant 77 year old male who had the pleasure of initially meeting in October 2011.  On this date, the patient was for a status post injury in his yard where a rather large branch fell directly onto the patient's head. A CAT scan was obtained and the patient was ultimately diagnosed with spinous process fractures involving the lower cervical and the upper thoracic spine.  I did treat the patient conservatively but he went to have ongoing pain, mainly involving the mid region of his thoracic spine.  I therefore did go forward with the CAT scan of his thoracic spine.  The findings were normal for a T8 compression fracture.  We did go forward with ongoing nonoperative measures.  However, the patient went onto have debilitating pain over the mid region of his back.  In order to confirm the acuity of the T8 compression fracture, I did ultimately ordered a bone scan which was obtained on June 22, 2010. This was consistent with increased uptake involving the T8 vertebral body and therefore I did diagnose the patient with a T8 compression fracture and I did feel that given the positive bone scan and his ongoing symptoms, that the T8 compression fracture was responsible for the patient's symptoms.   Therefore, I had a discussion regarding going forward with a kyphoplasty.  The patient did understand the risks, limitations of the procedure as all in my preoperative note.  OPERATIVE DETAILS:  On July 13, 2010, the patient was brought to surgery and general endotracheal anesthesia was administered.  The patient was placed prone on a flat Jackson table.  Pillows were placed under the patient's chest and under his hips.  AP and lateral fluoroscopic imaging was set up and utilized throughout the case.  The back was then prepped and draped in the usual sterile fashion.  I then marked out the lateral border of the pedicle associated with the T8 vertebral body.  I then made a stab incision along the superolateral aspect of the pedicle and I did inserted the Jamshidi needle down the trajectory of the T8 pedicle, using AP and lateral fluoroscopic views throughout.  The pedicle was then cannulated on the contralateral side, again using the Jamshidi needle until the vertebral body was entered.  I then used a drill through the cannula on each side.  I then inflated a kyphoplasty balloon first on the right and then on the left.  The vertebral  body had only comminuted approximately 1 mL of volume in the balloon.  The balloon was then deflated and removed and cement was introduced through the trocars bilaterally.  A total of approximately 3.5 mL of cement was introduced into the vertebral body.  I did note excellent interdigitation of the vertebral body.  There was no extravasation of cement into the veins or into the spinal canal.  The trocars were then removed.  The stab incisions were closed using 3-0 Monocryl and bandages were applied bilaterally.  I was very happy with the final images.  The patient was then awakened from general endotracheal anesthesia and transferred to recovery in stable condition.  The patient was noted to have full neurovascular function upon awakening.  All instrument  counts were correct at the termination of the procedure.     Estill Bamberg, MD     MD/MEDQ  D:  07/13/2010  T:  07/14/2010  Job:  [id number]  cc:   [name], M.D.  Electronically Signed by Estill Bamberg  on 07/18/2010 08:26:32 AM

## 2010-08-22 ENCOUNTER — Other Ambulatory Visit: Payer: Self-pay | Admitting: *Deleted

## 2010-08-22 DIAGNOSIS — I119 Hypertensive heart disease without heart failure: Secondary | ICD-10-CM

## 2010-08-22 MED ORDER — METOPROLOL TARTRATE 25 MG PO TABS: 25.0000 mg | ORAL_TABLET | Freq: Two times a day (BID) | ORAL | Status: DC

## 2010-08-22 NOTE — Consult Note (Signed)
VASCULAR SURGERY CONSULTATION   [name], [name]  DOB:  11/03/1933                                       02/19/2008  [id number]#:[id number]   REFERRING PHYSICIAN:  [name], M.D.   CHIEF COMPLAINT:  Left lower extremity pain.   HISTORY:  The patient is a 77 year old gentleman who describes  approximately a 1 month history of exercise induced left calf  discomfort.  This is relieved readily by rest.  Occurs after walking  approximately a block.  Worsened by walking uphill or at a faster pace.  No pain at rest.  Relieved by resting.  No night pain or rest pain.   No history of nonhealing ulceration or gangrene.   Risk factors for peripheral vascular disease include coronary artery  disease, hyperlipidemia, hypertension and family history.   PAST MEDICAL HISTORY:  1. Hypertension.  2. Hyperlipidemia.  3. Coronary artery disease.   MEDICATIONS:  1. NitroQuick 0.4 mg p.r.n.  2. Plavix 75 mg daily.  3. Vytorin 80/10 daily.  4. Atenolol 50 mg q.a.m. and 25 mg q.p.m.  5. Hydrochlorothiazide 25 mg daily.  6. Lisinopril 40 mg daily.  7. Isosorbide monohydrate 60 mg daily.  8. Folbic one tablet daily.  9. Aspirin 325 mg daily.  10.Slo-Niacin 500 mg b.i.d.  11.Diphenhydramine 25 mg daily.   ALLERGIES:  Tetracycline.   FAMILY HISTORY:  Mother died age 82 of congestive heart failure.  Father  died age 65 of an MI.  He has one brother living age 60 who is generally  well.   SOCIAL HISTORY:  The patient is married with three grown children.  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  Does not smoke.  Has one alcoholic beverage daily.   REVIEW OF SYSTEMS:  Refer to patient encounter form.  This was reviewed  today.  The patient notes shortness of breath with exertion.  A history  of heart murmur.  Hiatal hernia.  Pain in his legs with walking.  Arthritis.   PHYSICAL EXAM:  General:  A well-appearing 77 year old male.  Alert and  oriented in  no distress.  Vital signs:  BP is 133/72, pulse 58 per  minute.  HEENT:  Unremarkable.  Neck:  Supple.  No thyromegaly or  adenopathy.  Chest:  Equal entry bilaterally.  No rales or rhonchi.  Cardiovascular:  No carotid bruits.  Normal heart sounds without  murmurs.  Regular rate and rhythm.  No gallops or rubs.  Abdomen:  Soft,  nontender.  No masses or organomegaly.  Normal bowel sounds.  Lower  extremities:  2+ femoral pulses bilaterally.  2+ right dorsalis pedis  and posterior tibial pulse.  1+ left dorsalis pedis and posterior tibial  pulse.  No ankle edema.  Neurologic:  Cranial nerves intact.  Strength  equal bilaterally.  1+ reflexes.   INVESTIGATIONS:  Lower extremity Doppler evaluation reveals mild  reduction in left ankle brachial index at 0.83 and right ABI 0.97.  Marked reduction in left ankle brachial index with exercise and slow  recovery associated with left calf pain.   IMPRESSION:  1. Left lower extremity claudication.  2. Hypertension.  3. Coronary artery disease.  4. Hyperlipidemia.   PLAN:  Plan is to perform arteriography with potential intervention for  likely left iliac or SFA stenosis.   Chronic medical conditions well-controlled.  Balinda Quails, M.D.  Electronically Signed  [facility]/MEDQ  D:  02/19/2008  T:  02/20/2008  Job:  [id number]   cc:   [name], M.D.

## 2010-08-22 NOTE — Telephone Encounter (Signed)
Refilled meds per fax request.  

## 2010-08-22 NOTE — Discharge Summary (Signed)
NAMERIKER, COLLIER NO.:  [id number]   MEDICAL RECORD NO.:  [id number]          PATIENT TYPE:  OIB   LOCATION:  6529                         FACILITY:  [facility]   PHYSICIAN:  Peter M. Swaziland, M.D.  DATE OF BIRTH:  24-Jul-1933   DATE OF ADMISSION:  01/27/2008  DATE OF DISCHARGE:  01/28/2008                               DISCHARGE SUMMARY   HISTORY OF PRESENT ILLNESS:  Briefly, Mr. Sitzer is a 77 year old white  male with known coronary artery disease.  He has had previous redo  coronary artery bypass surgery in 1994.  He subsequently had a stenting  of the vein graft to the right coronary artery in 2005.  He had stenting  of the vein graft to the obtuse marginal vessel in 200j7.  He presents  now with recurrent increasing anginal symptoms.  He also had an  echocardiogram that demonstrated moderate aortic stenosis.  For details  of past medical history, social history, family history, and physical  exam, please see admission history and physical.   LABORATORY DATA PRIOR TO PROCEDURE:  His ECG showed sinus bradycardia  with left axis deviation and incomplete right bundle-branch block.  Chest x-ray showed no active disease.  Coags were normal.  CBC was  normal.  Glucose was 106, BUN 24, and creatinine 1.0.  Electrolytes were  normal.   HOSPITAL COURSE:  The patient underwent cardiac catheterization on  January 27, 2008.  This demonstrated normal right heart pressures.  There was no aortic valve or mitral valve gradient.  His left  ventricular function was normal with ejection fraction of 60%.  He had  occlusion of both his left main coronary artery and right coronary  artery.  The saphenous vein graft to the right coronary artery was  widely patent.  The LIMA graft to the diagonal and LAD was widely  patent.  The saphenous vein graft to the obtuse marginal vessel  demonstrated in-stent restenosis in the proximal stent up to 50% and in  the more distal stent up to  90-95%.  These two stented segments were  intervened on and we repeated stenting of these segments both with 3.0 x  80 mm Promus stents and that were postdilated to 3.25 mm.  We did  achieve an excellent angiographic result with no significant residual  stenosis.  The patient did very well postprocedure.  He had no angina or  shortness of breath.  His CBC and chemistries remained stable.  He had  no groin hematoma.  He did have a 7 beat run of nonsustained ventricular  tachycardia that was asymptomatic.  The following day, the patient was  stable and felt he was adequate for discharge.   DISCHARGE DIAGNOSIS:  1. Recurrent angina pectoris.  2. Coronary artery disease, status post coronary artery bypass graft      and multiple prior coronary interventions.  3. Recent claudication.  4. Hypertension.  5. Hyperlipidemia.  6. No significant aortic stenosis by cardiac catheterization.  7. Successful repeat stenting of the vein graft to the obtuse marginal      vessel.  Discharge medications will remain the same.  He is on:  1. Atenolol 25 mg in the morning and 50 mg in the evening.  2. Plavix 75 mg per day.  3. Benadryl 25 mg nightly.  4. Foltx daily.  5. Multivitamin daily.  6. Aspirin 325 mg per day.  7. Niaspan 500 mg b.i.d.  8. Lisinopril 40 mg per day.  9. Hydrochlorothiazide 25 mg per day.  10.Vytorin 10/80 mg per day.  11.Isosorbide mononitrate 60 mg per day.   The patient is to avoid any heavy lifting or straining for 1 week and he  follow up with Dr. Patty Sermons in 1 week.  His discharge status is  improved.           ______________________________  Peter M. [place]iland, M.D.     PMJ/MEDQ  D:  01/28/2008  T:  01/28/2008  Job:  [id number]   cc:   [name], M.D.

## 2010-08-22 NOTE — Cardiovascular Report (Signed)
[name], [name]:  [id number]   MEDICAL RECORD NO.:  [id number]          PATIENT TYPE:  OIB   LOCATION:  2856                         FACILITY:  [facility]   PHYSICIAN:  Peter M. Swaziland, M.D.  DATE OF BIRTH:  06-Jul-1933   DATE OF PROCEDURE:  02/13/2007  DATE OF DISCHARGE:                            CARDIAC CATHETERIZATION   INDICATION FOR PROCEDURE:  [name] is a 77 year old white male with  longstanding history of coronary artery disease.  He is status post  coronary artery bypass surgery in 1982 with redo bypass surgery in 1994.  He has had prior stenting of the vein graft to the right coronary artery  as well as more recent stenting of the vein graft to the first and  second obtuse marginal vessels in July 2007.  He now presents with  exertional angina and stress Cardiolite study demonstrates anteroapical  ischemia at good workload.   PROCEDURES:  1. Left heart catheterization.  2. Coronary and left ventricular angiography.  3. Saphenous vein graft angiography x2.  4. Left internal mammary artery graft and subclavian artery      angiography.   ACCESS:  Via the right femoral artery using the standard Seldinger  technique.   EQUIPMENT USE:  6-French 4-cm right and left Judkins catheters, 6-French  pigtail catheter, 6-French IMA catheter, 6-French multipurpose catheter.   MEDICATIONS:  Local anesthesia with 1% Xylocaine.   CONTRAST:  160 mL of Omnipaque.   HEMODYNAMIC DATA:  Aortic pressure is 134/55 with a mean of 87.  Left  ventricle pressure is 137 with EDP of 23 mmHg.   ANGIOGRAPHIC DATA:  The left coronary arises normally.  The left main  coronary artery is occluded proximally.   The right coronary arises anteriorly and is occluded proximally.   There is a saphenous vein graft to the first obtuse marginal vessel.  It  is widely patent.  There are stents noted in the proximal vein graft and  the mid vein graft.  The proximal stent has a  30% focal narrowing in the  distal third of the stent.  The mid vein graft stent is widely patent.  There is excellent runoff down the first obtuse marginal vessel.  The  continuation of the vein graft to the second obtuse marginal vessel was  occluded and the second marginal vessel and distal circumflex fill by  right-to-left collaterals from the right coronary artery.   The saphenous vein graft to the distal right coronary artery is widely  patent.  There is 20-30% narrowing in the mid vessel prior to the  stented segment.  The stented segment is widely patent.   The left subclavian artery is widely patent.   The LIMA graft was very difficult to cannulate, but good flush shots  demonstrated excellent patency of the IMA graft throughout.  It inserts  into the diagonal and then into the mid-LAD.  The distal LAD is small  and diffusely and severely diseased.   Left ventricular angiography was performed in the RAO view.  This  demonstrates normal left ventricular size and contractility with normal  systolic function.  Ejection fraction is estimated at 60%.  There is no  significant mitral insufficiency.   FINAL INTERPRETATION:  1. Severe three-vessel and left main coronary artery disease.  2. Patent left internal mammary artery graft to the left anterior      descending artery and diagonal vessels.  3. Patent saphenous vein graft to the distal right coronary.  4. Patent saphenous vein graft to the first obtuse marginal vessel      with occlusion of the ongoing vein graft to the second marginal      vessel.  5. Normal left ventricular function.   PLAN:  Based on these findings, I feel that his recent symptoms are  related to the occlusion of the ongoing vein graft to the second  marginal vessel and to his severe distal LAD disease.  These are not  amenable to intervention, and the patient will need to be treated  medically.           ______________________________  Peter M.  [place]il[place], M.D.     PMJ/MEDQ  D:  02/13/2007  T:  02/14/2007  Job:  [id number]   cc:   [name], M.D.

## 2010-08-22 NOTE — Op Note (Signed)
[name], [name]                ACCOUNT NO.:  [id number]   MEDICAL RECORD NO.:  [id number]          PATIENT TYPE:  AMB   LOCATION:  SDS                          FACILITY:  [facility]   PHYSICIAN:  Charles E. Fields, MD  DATE OF BIRTH:  [birth year]   DATE OF PROCEDURE:  09/17/2008  DATE OF DISCHARGE:                               OPERATIVE REPORT   PROCEDURE:  Aortogram, left lower extremity arteriogram.   PREOPERATIVE DIAGNOSIS:  Claudication, left lower extremity.   POSTOPERATIVE DIAGNOSIS:  Claudication, left lower extremity.   SURGEON:  Janetta Hora. Fields, MD   ANESTHESIA:  Local.   OPERATIVE DETAILS:  After obtaining informed consent, the patient was  brought to the [facility] lab.  The patient was placed in supine position on the  angio table.  Both groins were prepped and draped in the usual sterile  fashion.  Local anesthesia was infiltrated over the right common femoral  artery.  An introducer needle was used to cannulate the right common  femoral artery and a 0.035 Wholey wire was advanced into the abdominal  aorta under fluoroscopic guidance.  Next, a 5-French sheath was placed  over the guidewire in the right common femoral artery.  A 5-French  crossover catheter was then brought up in the operative field and this  was used to selectively catheterize the left common iliac artery.  Arteriogram of the left common iliac artery was then performed.  This  shows a calcified lesion in the proximal portion of the left common  iliac which obstructs the lumen approximately 50%.  The left external  iliac and internal iliac arteries are patent.  Next, the crossover  catheter was used to steer a guidewire down into the distal left  external iliac artery.  The crossover catheter was then removed and  exchanged for a 5-French straight catheter.  The left lower extremity  arteriogram was then obtained.  This shows a patent left profunda  femoris and left common femoral artery.  The origin of the  left  superficial femoral artery is widely patent.  There is a high-grade  greater than 90% stenosis of the left superficial femoral artery  approximately 3 cm from its origin.  There is mild atherosclerotic  change diffusely of the entire left superficial femoral artery.  There  is a stent in the distal left superficial femoral artery near the  adductor hiatus.  This has some neoplastic narrowing approximately 10-  15%.  Otherwise, this is widely patent.  Left popliteal artery is  patent.  There is three-vessel runoff to the left foot.   At this point, the 5-French straight catheter was pulled back over a  guidewire.  The 5-French sheath was then exchanged in the right groin  over a guidewire for a 6-French straight long Terumo sheath.  After the  sheath exchange, the patient was given 5000 units of intravenous  heparin.  The [facility] wire was then advanced across the left superficial  femoral artery lesion after appropriate circulation time of heparin.  A  7 x 40 mm nitinol stent was then placed using roadmapping techniques  at  the level of the lesion in the left superficial femoral artery.  This  was then deployed in usual fashion.  The lesion was then postdilated  with a 5 x 2 mm angioplasty balloon.  A completion arteriogram was then  obtained.  This shows wide patency of the left superficial femoral  artery.  The left superficial femoral artery fills briskly at this  point.  Again, there is some mild in-stent restenosis of the left  superficial femoral artery stent that had been previously placed in the  adductor hiatus.  The left popliteal artery is widely patent.  The  origins of all three tibial vessels are patent.  The anterior tibial  artery has a mid 70% lesion.  There is a sluggish flow down the tibial  vessels presumably due to some cardiac dysfunction.  There is preserved  three-vessel runoff to the left foot.   At this point, the Terumo sheath was pulled back over a  guidewire down  into the pelvis.  The sheath was thoroughly flushed with heparinized  saline and left in place to be pulled after the ACT was less than 175.  The patient tolerated the procedure well and there were no  complications.  The patient was taken to the holding area in stable  condition.   OPERATIVE FINDINGS:  1. Patent left superficial femoral artery stent at the adductor hiatus      with 10-25% restenosis.  2. High-grade proximal left superficial femoral artery stenosis      greater than 90%, repaired with the 7 x 40 nitinol self-expanding      stent.      Janetta Hora. Fields, MD  Electronically Signed     CEF/MEDQ  D:  09/17/2008  T:  09/18/2008  Job:  [id number]   cc:   Colleen Can. Deborah Chalk, M.D.

## 2010-08-22 NOTE — H&P (Signed)
NAMEKIPTYN, RAFUSE NO.:  [id number]   MEDICAL RECORD NO.:  [id number]          PATIENT TYPE:  OIB   LOCATION:  NA                           FACILITY:  [facility]   PHYSICIAN:  Peter M. Swaziland, M.D.  DATE OF BIRTH:  1933-12-04   DATE OF ADMISSION:  DATE OF DISCHARGE:                              HISTORY & PHYSICAL   HISTORY OF PRESENT ILLNESS:  [name] is a 77 year old white male with  known history of coronary artery disease.  He presents with symptoms of  increasing angina for the past 6 weeks.  His angina occurs with walking  after sexual activities or after eating a heavy meal.  It is relieved  with rest, but he does feel that this has progressed over his baseline.  He was noted to have a murmur and Dr. Patty Sermons obtained an  echocardiogram.  This demonstrated moderate aortic stenosis.  He had a  mean gradient of 16 mmHg and a peak gradient of 28 mmHg.  Valve area was  calculated at 1.1 sq. cm.  It is noteworthy that on his last cardiac  catheterization in November 2008 there was no significant aortic valve  gradient.  The patient also has been experiencing some increase symptoms  of left calf claudication during this time.  Given his extensive cardiac  history and now findings of moderate aortic stenosis, it is recommended  he undergo repeat cardiac catheterization at this time to further try to  identify the cause of his increased symptoms.   His past cardiac history includes a history of coronary artery bypass  surgery in 1982 with subsequent redo coronary artery bypass surgery x3  in 1994.  This included a vein graft to the distal right coronary, vein  graft to the first and second obtuse marginal vessels, and a LIMA graft  sequentially to the first diagonal and to the mid LAD.  He subsequently  had stenting of the vein graft to the right coronary in 2005 using a 3.0  x 13 mm Cypher stent.  In July 2007, he had stenting of the vein graft  to the obtuse  marginal vessels using a 3.0 x 13 mm Cypher stent in the  mid vein graft, and a 3.0 x 18 mm Cypher stent in the proximal vein  graft.  Subsequent cardiac catheterization in November 2008 showed that  the grafts were patent with the exception there was occlusion in the  vein graft segment between the first and second obtuse marginal vessels.  Based on those findings, the patient has been treated medically.  Additional past medical history is significant for hyperlipidemia,  hypertension.   He has had previous appendectomy, tonsillectomy, adenoidectomy,  bilateral cataract surgery, and tendon repair in his left elbow.   ALLERGIES:  He is allergic to TETRACYCLINE.   His current medications include:  1. Atenolol 25 mg in the morning, 50 mg in the evening.  2. Plavix 75 mg per day.  3. Benadryl 25 mg nightly.  4. Voltex daily.  5. Multivitamin daily.  6. Aspirin 325 mg per day.  7. Niaspan 500  mg b.i.d.  8. Lisinopril 40 mg per day.  9. Hydrochlorothiazide 25 mg per day.  10.Vytorin 10/80 mg per day.  11.Isosorbide mononitrate 60 mg per day.   SOCIAL HISTORY:  He is married.  He has 3 children.  He has not smoked  in 40 years.  He denies alcohol use.   FAMILY HISTORY:  Father died at age 10 of myocardial infarction.  He has  multiple family members who have had early coronary artery disease.   REVIEW OF SYSTEMS:  He does complain of left calf claudication.  He  denies any TIA or CVA symptoms.  He has no palpitations.  There is no  history of bleeding disorder.  He has had no recent bowel or bladder  complaints.  Other review of systems are negative.   On physical exam, the patient is a pleasant white male in no distress.  Weight is 153, blood pressure is 130/78, pulse is 64 and regular. His  HEENT exam is normocephalic, atraumatic.  Pupils equal, round, and  reactive to light and accommodation.  Extraocular movements are full.  Oropharynx is clear.  Neck is supple without JVD,  adenopathy,  thyromegaly, or bruits.  Lungs were clear.  Cardiac exam reveals regular  rate and rhythm.  He has a grade 2/6 systolic murmur, heard best in the  right upper sternal border.  There is no diastolic murmur, S3.  Abdomen  is soft, nontender without masses or bruits.  Extremities reveal  excellent pedal pulses without edema or phlebitis.  Neurologic exam is  nonfocal.   LABORATORY DATA:  ECG shows normal sinus rhythm with a right bundle-  branch block.  His chest x-ray showed no active disease.  Coags were  normal.  CBC was normal.  Glucose 106, BUN 24, creatinine 1.0.  Electrolytes were normal.  Previous echocardiogram dated January 15, 2008, showed moderate aortic stenosis.  There was normal left  ventricular function with mild LVH.  There is no evidence of diastolic  dysfunction.  He had mild left atrial enlargement.  Estimated pulmonary  pressures were normal.   IMPRESSION:  1. Increased anginal symptoms.  Based on his prior history, I think it      is more likely he has progressive vein graft disease.  While he      does have a component of aortic stenosis, this does not appear to      be significant but needs to be reevaluated with a catheterization.  2. Left calf claudication.  3. Coronary artery disease status post redo coronary artery bypass      grafting and multiple interventions as noted.  4. Hyperlipidemia.  5. Hypertension.   PLAN:  We will proceed with right and left heart catheterization and  coronary and graft angiography to decide appropriate therapy.           ______________________________  Peter M. [place], M.D.     PMJ/MEDQ  D:  01/26/2008  T:  01/27/2008  Job:  [id number]   cc:   [name], M.D.

## 2010-08-22 NOTE — Procedures (Signed)
LOWER EXTREMITY ARTERIAL EVALUATION-SINGLE LEVEL   INDICATION:  Follow-up evaluation status post left superficial femoral  artery stent x2.   HISTORY:  Diabetes:  No.  Cardiac:  Coronary artery bypass graft and stent.  Hypertension:  Yes.  Smoking:  No.  Previous Surgery:  Left superficial femoral artery PTA and stent on  March 17, 2008.  Proximal stenting of the left superficial femoral  artery September 17, 2008 by Dr. Darrick Penna.   RESTING SYSTOLIC PRESSURES: (ABI)                          RIGHT                LEFT  Brachial:               86                   92  Anterior tibial:        96                   94  Posterior tibial:       102 (greater than 1.0)                    100  (greater than 1.0)  Peroneal:  DOPPLER WAVEFORM ANALYSIS:  Anterior tibial:        Triphasic            Triphasic  Posterior tibial:       Triphasic            Triphasic  Peroneal:   PREVIOUS ABI'S:  Date: Sep 02, 2008  RIGHT:  0.98  LEFT:  0.63   IMPRESSION:  Ankle brachial indices suggest no significant arterial  occlusive disease.   Right ABI is stable compared to previous study.   Left ABI is higher than previously recorded.   Left ABI suggests patent left superficial femoral artery stents x2.   ___________________________________________  P. Liliane Bade, M.D.   MC/MEDQ  D:  10/28/2008  T:  10/28/2008  Job:  [id number]

## 2010-08-22 NOTE — Op Note (Signed)
NAMERUAL, VERMEER                ACCOUNT NO.:  [id number]   MEDICAL RECORD NO.:  [id number]          PATIENT TYPE:  AMB   LOCATION:  SDS                          FACILITY:  [facility]   PHYSICIAN:  Balinda Quails, M.D.    DATE OF BIRTH:  [birth year]   DATE OF PROCEDURE:  03/17/2008  DATE OF DISCHARGE:                               OPERATIVE REPORT   PHYSICIAN:  Balinda Quails, MD   DIAGNOSIS:  Left lower extremity claudication.   PROCEDURES:  1. Abdominal aortogram with bilateral lower extremity runoff      arteriography.  2. Selective left lower extremity arteriogram.  3. Left superficial femoral artery percutaneous transluminal      angioplasty/stent.   ACCESS:  Right common femoral artery 6-French sheath.   CONTRAST:  180 mL of Visipaque.   COMPLICATIONS:  None apparent   CLINICAL NOTE:  [name] is a 77 year old gentleman who was seen  in consultation for limiting claudication of right lower extremity.  Workup for this revealed evidence of possible right iliac or superficial  femoral artery disease.  He is brought to the Cath Lab at this time for  diagnostic arteriography and possible intervention.   PROCEDURE NOTE:  The patient was brought to the Cath Lab in stable  condition.  Placed in supine position.  Both groins were prepped and  draped in a sterile fashion.  Skin and subcutaneous tissue was instilled  with 1% Xylocaine.  The right common femoral artery access obtained with  an 18 gauge needle.  A 0.035 Wholey guidewire advanced through the  needle into the mid abdominal aorta.  A 5-French sheath advanced over  the guidewire.   Mid abdominal aortogram obtained.  This revealed moderate stenosis right  main renal artery.  Left renal artery widely patent.  Infrarenal aorta  normal in caliber.  The common iliac arteries revealed some layering of  plaque without significant stenosis.  The hypogastric and external iliac  arteries were patent bilaterally.   The  pigtail catheter brought down the aortic bifurcation and bilateral  lower extremity runoff arteriography obtained.  The common femoral  arteries were patent bilaterally.  The profunda femoris artery was  patent bilaterally.   Right lower extremity revealed intact superficial femoral artery with  areas of mild stenosis, the right popliteal artery intact.  The  tibioperoneal trunk normal.  The right anterior tibial artery revealed  subtotal occlusion.  The posterior tibial and peroneal arteries were  dominant runoff in the right calf.   Left lower extremity revealed the proximal and mid left SFA to be widely  patent.  There was an exophytic plaque in the left superficial femoral  artery with a short segment total occlusion.  Reconstitution of the  popliteal artery and two vessel peroneal and posterior tibial runoff in  the left calf.   The right femoral sheath was then exchanged for a 6-French Terumo  sheath.  Using a crossover catheter, the guidewire was advanced down in  the left external iliac artery.  The Terumo sheath then advanced down  the left external iliac artery  and the dilator removed.   Using a 0.035 Wholey guidewire, this was advanced down into the left  superficial femoral artery and across the area of short segment of  occlusion.   The patient administered 4000 units of heparin intravenously.  Predilatation of the occlusion was carried out with a 5 x 40 Powerflex  balloon at 12 atmospheres for 30 seconds.  This was then stented with a  self expanding Absolute Pro 7 x 40 stent.  This was postdilated with a 5  x 40 Powerflex at 12 atmospheres for 30 seconds.   Completion of arteriogram revealed an excellent technical result with  minimal residual stenosis.  Intact three-vessel tibial runoff in the  left calf.   The Terumo sheath was then drawn back into the right common iliac  artery.  Guidewires removed.  ACT checked and the sheath will be removed  when  appropriate.   No apparent complications.  The patient transferred to the holding area  in stable condition.   FINAL IMPRESSION:  1. Mild right renal artery stenosis.  2. Normal aortoiliac segment.  3. Short segment left superficial femoral artery occlusion.  4. Successful angioplasty and stenting left SFA occlusion with minimal      residual stenosis and no apparent complications.   DISPOSITION:  The patient will be discharged today with planned followup  in the office in approximately 1 month.      Balinda Quails, M.D.  Electronically Signed     PGH/MEDQ  D:  03/17/2008  T:  03/17/2008  Job:  [id number]   cc:   [name], M.D.

## 2010-08-22 NOTE — H&P (Signed)
NAMEGUERINO, CAPORALE NO.:  [id number]   MEDICAL RECORD NO.:  [id number]           PATIENT TYPE:   LOCATION:                                 FACILITY:   PHYSICIAN:  Peter M. Swaziland, M.D.       DATE OF BIRTH:   DATE OF ADMISSION:  02/13/2007  DATE OF DISCHARGE:                              HISTORY & PHYSICAL   HISTORY OF PRESENT ILLNESS:  Mr. Rochford is a 77 year old white male with  a known history of coronary artery disease who presents with symptoms of  increased angina.  He describes these symptoms as throat tightness.  He  has never had chest pain or left arm pain; however, this is his typical  anginal symptoms.  He only gets it with exertion and it is relieved with  rest.  He has had no associated shortness of breath, nausea, vomiting or  diaphoresis.  He notices this typically when he first starts exercise  before he gets warmed up and then sometimes if he just keeps going, his  pain will abate.  He does note that when he is playing tennis that these  symptoms have come on quicker and with less exertion and have taken  longer to go away.  He still has not had to use any nitroglycerin.  To  further evaluate his symptoms, he underwent a stress Cardiolite study by  Dr. Patty Sermons on February 03, 2007.  He 10-1/2 minutes on the Bruce  Protocol with a peak heart rate of 135.  He did have typical throat  pain.  He had significant inferolateral ST segment depression and  subsequent Cardiolite images demonstrated evidence of anterior apical  ischemia.  Ejection fraction was normal at 54%.   PRIOR CARDIAC HISTORY:  Includes a history of coronary artery bypass  graft surgery in 1982 with subsequent redo bypass surgery x3 in 1994 and  this included a saphenous vein graft, distal right coronary artery  distal saphenous vein graft to the first and second obtuse marginal  vessels and a LIMA graft sequentially to the first diagonal and mid LAD.  He has subsequently had  stenting of the vein graft to the right coronary  artery in 2005 using a 3.0 x 13.0 mm Cypher stent.  In July of 2007, he  was admitted and had stenting of the vein graft to the obtuse marginal  vessels using a 3.0 x 13.0 mm Cypher stent and a mid vein graft and a  3.0 x 18.0 mm Cypher stent proximal vein graft.  At that time, his LIMA  to the LAD was widely patent, but it was noted that he had significant  distal disease in the LAD after the touch down of the IMA graft.   PAST MEDICAL HISTORY:  1. Coronary artery disease status post coronary artery bypass graft as      noted above, status post prior stenting.  2. Hyperlipidemia.  3. Hypertension.   PAST SURGICAL HISTORY:  He has had prior surgery including:  1. Appendectomy.  2. Tonsillectomy.  3. Adenoidectomy.  4. Bilateral cataract surgery.  5. Tendon repair of his left elbow.   ALLERGIES:  HE IS ALLERGIC TO TETRACYCLINE.   CURRENT MEDICATIONS:  Include:  1. Atenolol 50 mg in the morning, 25 mg in the evening.  2. Diazepam p.r.n.  3. Plavix 75 mg daily.  4. Benadryl 25 mg q.h.s.  5. Foltx daily.  6. Multivitamin daily.  7. Aspirin 325 mg q.h.s.  8. Niaspan 500 mg 2 tablets daily.  9. Nitroglycerin p.r.n.  10.Fish oil 1000 mg daily.  11.Lisinopril 40 mg per day.  12.HCTZ 25 mg per day.  13.Benicar 20 mg per day.  14.Vytorin 10/80 mg per day.   FAMILY HISTORY:  Father died at age 21 of a myocardial infarction.  He  has had multiple family members who have had coronary disease.   SOCIAL HISTORY:  He is married and has 3 children.  He has not smoked in  38 years.  He denies alcohol use.   REVIEW OF SYSTEMS:  He denies any claudication.  He has had no history  of TIA or CVA symptoms.  He has had no bleeding disorders.  No recent  change in bowel or bladder habits.  The patient does remain physically  active.   PHYSICAL EXAMINATION:  GENERAL:  The patient is a pleasant white male in  no acute distress.  VITAL SIGNS:   Weight is 147.  Blood pressure is 138/60.  Pulse 48 and  regular.  HEENT EXAM:  Normocephalic, atraumatic.  Pupils equal, round and  reactive to light and accommodation.  Extraocular movements are full.  Oropharynx is clear.  NECK:  Without JVD, adenopathy, thyromegaly or bruits.  LUNGS:  Clear.  CARDIAC EXAM:  Reveals a regular rate and rhythm.  Normal S1 and S2 with  a grade 1/6 systolic murmur in the left sternal border.  ABDOMEN:  Soft and nontender without masses or bruits.  EXTREMITIES:  Reveal excellent pedal pulses.  There is no edema or  phlebitis.  NEUROLOGIC EXAM:  Nonfocal.   LABORATORY DATA:  Resting ECG shows normal sinus rhythm with a right  bundle branch block.  Chest x-ray shows no active disease.  CBC is  normal.  Chemistries are normal.  Cholesterol 131, triglycerides 88, HDL  49, LDL of 65.   IMPRESSION:  1. Coronary artery disease with extensive prior history, now with      increased anginal symptoms.  Stress Cardiolite study is abnormal      showing evidence of anterior apical ischemia.  2. Status post a redo coronary artery bypass graft in 1994.  3. Status post prior stenting on the vein graft to the right coronary      artery in 2005.  Status post stenting of the vein graft to the      marginal vessels in 2007.  4. Hyperlipidemia.  5. Hypertension.   PLAN:  We will proceed with diagnostic cardiac catheterization with  potential intervention if needed.  If his cardiac catheterization  demonstrates ischemia in the distal left anterior descending territory,  then this probably would not be amenable to intervention and would need  to be treated medically.           ______________________________  Peter M. Swaziland, M.D.     PMJ/MEDQ  D:  02/11/2007  T:  02/11/2007  Job:  [id number]   cc:   [name], M.D.

## 2010-08-22 NOTE — Cardiovascular Report (Signed)
[name], [name]:  [id number]   MEDICAL RECORD NO.:  [id number]          PATIENT TYPE:  OIB   LOCATION:  6529                         FACILITY:  [facility]   PHYSICIAN:  Peter M. Swaziland, M.D.  DATE OF BIRTH:  1933-09-23   DATE OF PROCEDURE:  DATE OF DISCHARGE:                            CARDIAC CATHETERIZATION   INDICATION FOR PROCEDURE:  The patient is a 77 year old white male with  known history of coronary artery disease.  He had had prior coronary  artery bypass surgery in 1982 with redo bypass surgery in 1994.  He had  stenting of the vein graft to the right coronary in December 2005 and  subsequently had stenting of 2 areas in the vein graft to the obtuse  marginal vessel in 2007.  He presents now with recurrent anginal  symptoms.  He also had an echocardiogram, which suggested moderate  aortic stenosis.  Therefore, we did right and left heart  catheterization, coronary and left ventricular angiography, saphenous  vein graft angiography x2, LIMA graft angiography, and intracoronary  stenting of the vein graft to the obtuse marginal vessel.   Access was via the right femoral artery and vein using the standard  Seldinger technique.   EQUIPMENTS USED:  A 6-French 4 cm right and left Judkins catheter, 6-  French pigtail catheter, 6-French Williams right catheter, 6-French  multipurpose catheter, 6-French left Amplatz 1 guide, a 0.014 Prowater  wire, a 2.5 x 15 mm Voyager balloon, a 3.0 x 18 mm Promus stents x2, and  a 3.25 x 50 mm [place] Voyager balloon.  The patient also had a 5-French  balloon-tip Swan-Ganz catheter with a venous sheath.   TOTAL CONTRAST USED:  275 mL of Omnipaque.   MEDICATION:  Angiomax bolus of 0.75 mg/kg followed by continuous  infusion of 1.75 mg/kg/hour.  Subsequent ACT was 400 and 54 seconds.   HEMODYNAMIC DATA:  Right heart catheterization demonstrated a right  atrial pressure of 11/8 with a mean of 5 mmHg.  Right ventricular  pressure was 32 with an EDP of 6 mmHg.  Pulmonary artery pressure was  30/11 with a mean of 19 mmHg, and pulmonary capillary wedge pressure was  15/14 with a mean of 11 mmHg.  On pullback, aortic pressure was 120/41  with mean of 63 mmHg.  Left ventricular pressure was 118 with an EDP of  13 mmHg.  There was no significant aortic valve gradient.  There was no  significant mitral valve gradient.  Cardiac output by thermodilution was  3.2 liters per minute with an index of 1.8 by Fick cardiac output was  4.0 with an index of 2.2.  There was no evidence of shunt.   ANGIOGRAPHIC DATA:  The left main coronary artery was occluded  proximally.   The right coronary artery was occluded proximally.   Saphenous vein graft to the right coronary was widely patent.  There  were mild irregularities less than 20%.  There was a stent noted in the  mid vein graft that was still patent.  There was excellent runoff.   The saphenous vein graft  to the first obtuse marginal vessel  demonstrated 50% stenosis in the proximal vein graft.  In the distal  vein graft body, there was another stent that had severe high-grade  restenotic lesion up to 90-95%.   The LIMA graft to the diagonal and LAD was very difficult to cannulate  due to unusual takeoff from the left subclavian.  Despite using a right  Judkins catheter, Williams right catheter, and LIMA catheter, we were  not able to directly engage the IMA.  However, flush shots demonstrated  that it was a large graft, widely patent.  Inserted into the diagonal  and then the LAD.  There was noted to be diffuse distal disease in the  LAD.   Left ventricular angiography was performed in the RAO view.  This  demonstrates normal left ventricular size and contractility with normal  systolic function.  Ejection fraction is estimated 60%.  There is no  significant mitral insufficiency or prolapse.   We proceeded at this point with re-intervention of the vein graft to  the  first obtuse marginal vessel.  Given the fact that his other grafts were  patent and he had no significant aortic valve gradient, it was felt that  a third operation at this time was not warranted and that the risk would  be great.  We elected to restent these lesions.  After initial guide  shots were obtained, the patient was anticoagulated.  We proceeded with  this intervention.  The lesions were crossed easily with a guidewire.  We predilated both the proximal and distal vein graft lesions with a 2.5-  mm Voyager balloon up to 6 atmospheres.  We then stented the more distal  lesion first using a 3.0 x 18 mm Promus stent.  This was deployed at 8  and then 12 atmospheres with a stent balloon.  We then stented the more  proximal lesion using a 3.0 x 18 mm Promus stent, again deployed this at  8 and then 12 atmospheres.  Locations with 0% residual stenosis and TIMI  grade 3 flow.  The patient was pain free and hemodynamically stable.   FINAL INTERPRETATION:  1. Occlusive native vessel coronary artery disease involving the left      main ostium and the right coronary ostium.  2. Patent saphenous vein graft to the right coronary artery.  3. Patent LIMA graft sequentially to the diagonal and LAD.  4. Restenotic lesions in the proximal and distal vein graft to the      first obtuse marginal vessel.  5. Normal left ventricular function.  6. Normal right heart pressures.  7. No significant aortic valve gradient.  8. Successful repeat stenting of the proximal and distal saphenous      vein graft.           ______________________________  Peter M. Swaziland, M.D.     PMJ/MEDQ  D:  01/27/2008  T:  01/28/2008  Job:  [id number]   cc:   [name], M.D.

## 2010-08-22 NOTE — Assessment & Plan Note (Signed)
OFFICE VISIT   [name], [name]  DOB:  1933-12-31                                       10/28/2008  XTGGY#:[id number]   The patient underwent left superficial femoral angioplasty on 09/17/2008  carried out by Dr. Darrick Penna.  This was an uneventful procedure.  He was  discharged home that day.  He has had no complaints of claudication  since that time.   Doppler evaluation reveals ABIs greater than 1.0 bilaterally.   His right groin is unremarkable at the site of catheterization.  Lower  extremities reveal intact posterior tibial pulses of 2+.  No ankle  edema.   The patient is doing well following his recent SFA angioplasty.  Will  plan followup per protocol.   Balinda Quails, M.D.  Electronically Signed   PGH/MEDQ  D:  10/28/2008  T:  10/29/2008  Job:  [id number]

## 2010-08-22 NOTE — Op Note (Signed)
[name], [name]                ACCOUNT NO.:  [id number]   MEDICAL RECORD NO.:  [id number]          PATIENT TYPE:  AMB   LOCATION:  SDS                          FACILITY:  [facility]   PHYSICIAN:  Adolph Pollack, M.D.DATE OF BIRTH:  Jul 18, 1933   DATE OF PROCEDURE:  06/16/2007  DATE OF DISCHARGE:  06/16/2007                               OPERATIVE REPORT   PREOPERATIVE DIAGNOSIS:  Left inguinal hernia.   POSTOPERATIVE DIAGNOSIS:  Indirect left inguinal hernia.   PROCEDURE:  Left inguinal repair with mesh.   SURGEON:  Adolph Pollack, M.D.   ANESTHESIA:  General by way of LMA and local (0.5% Marcaine with  epinephrine).   INDICATION:  This is a 77 year old male with some significant cardiac  disease, although he is quite active.  He initially saw me in November  with a minimally symptomatic left inguinal hernia and we decided just to  treat it expectantly.  However, he is active, plays tennis, and has  begun having increasing pain and saw me back in January requesting  repair.  He received cardiac clearance by Dr. Patty Sermons and now presents  for the repair.  We discussed the procedure, risks and aftercare  preoperatively.   TECHNIQUE:  He was seen in the holding area and the left groin marked  with my initials.  He was then brought to the operating room and placed  supine on the operating table and a general anesthetic was administered.  The hair in the left groin and lower abdominal wall was clipped and the  area sterilely prepped and draped.  Marcaine solution was infiltrated  superficially and deep in the left groin.  A left groin incision was  made through skin, subcutaneous tissue and Scarpa fascia until the  external oblique aponeurosis was exposed.  I then infiltrated local  anesthetic deep to the external oblique aponeurosis.  An incision was  made in the external oblique aponeurosis through the external ring  medially and up toward the anterior superior iliac spine  laterally.  Using blunt dissection I exposed the shelving edge of the inguinal  inferiorly and the internal oblique muscle and aponeurosis superiorly.  I isolated the spermatic cord and the ilioinguinal nerve was adherent to  it.  I then created a window around the cord.  I noticed a large  indirect sac, which I was able to dissect free from the cord.  There was  a patulous internal ring with much of the defect being lateral to the  cord.  I was able to reduce the hernia and contents through the inguinal  ring but with any type of increased intra-abdominal pressure, it would  protrude.  I kept this reduced with a sponge.   Following this I took a piece of 3x 6-inch polypropylene mesh and  anchored it 1-2 cm medial to the pubic tubercle.  Using 2-0 Prolene  suture, I anchored the inferior aspect of the mesh to the shelving edge  of the inguinal ligament in a running fashion to a level 1-2 cm lateral  to the internal ring.  I then clipped a  slit in the mesh and wrapped the  two tails around the cord.  The superior aspect of the mesh was anchored  to the internal oblique aponeurosis with interrupted 2-0 Vicryl sutures.  I then removed the sponge that was holding the hernia contents in and  they continued to protrude out.  I then brought a medium mesh plug into  the field, put it in the patulous internal ring defect and then sewed it  to the internal oblique muscle and shelving edge of the inguinal  ligament with some 2-0 Prolene suture, which allowed for complete  reduction of the hernia contents.  I then tucked the two cords deep to  the external oblique aponeurosis laterally after approximating them and  creating a new internal ring with a 2-0 Prolene suture, which was  anchored to the shelving edge of the inguinal ligament.   Following this, hemostasis was adequate and I injected more local  anesthetic superiorly.  I then closed the external oblique aponeurosis  over the mesh and cord  with a running 3-0 Vicryl suture.  The  subcutaneous tissue was closed with a running 2-0 Vicryl suture.  The  skin was closed with a 4-0 Monocryl subcuticular stitch followed by  Steri-Strips and sterile dressings.  He tolerated the procedure well  without any apparent complications and was taken to the recovery room in  satisfactory condition.      Adolph Pollack, M.D.  Electronically Signed     TJR/MEDQ  D:  06/16/2007  T:  06/17/2007  Job:  [id number]   cc:   [name], M.D.

## 2010-08-22 NOTE — Procedures (Signed)
BYPASS GRAFT EVALUATION   INDICATION:  Left leg claudication, history of left superficial femoral  artery stent.   HISTORY:  Diabetes:  No.  Cardiac:  History of coronary artery bypass graft and stent.  Hypertension:  Yes.  Smoking:  No.  Previous Surgery:  Left superficial femoral artery angioplasty and stent  03/17/2008.   SINGLE LEVEL ARTERIAL EXAM                               RIGHT              LEFT  Brachial:                    120                116  Anterior tibial:             119                72  Posterior tibial:            116                76  Peroneal:  Ankle/brachial index:        0.98               0.63   PREVIOUS ABI:  Date:  07/16/2008  RIGHT:  >1.0  LEFT:  0.96   LOWER EXTREMITY BYPASS GRAFT DUPLEX EXAM:   DUPLEX:  Doppler arterial waveforms are biphasic in the left common  femoral artery and proximal left superficial femoral artery.  There is  an elevated peak systolic velocity of 387 cm/sec in the proximal  superficial femoral artery.  Distal to this point Doppler arterial  waveforms are monophasic.  There is no evidence of restenosis in the  distal left superficial femoral artery stent.   IMPRESSION:  1. Right ABI is stable compared to previous study and suggests no      arterial occlusive disease.  2. Left ABI has decreased significantly since previous study and      suggests moderate to severe arterial occlusive disease.  3. Duplex imaging revealed an elevated peak systolic velocity in the      proximal left superficial femoral artery consistent with >75%      stenosis.  4. No evidence of restenosis of distal left superficial femoral artery      stent.   ___________________________________________  P. Liliane Bade, M.D.   MC/MEDQ  D:  09/02/2008  T:  09/02/2008  Job:  [id number]

## 2010-08-22 NOTE — Assessment & Plan Note (Signed)
OFFICE VISIT   MATTIX, IMHOF  DOB:  06-Jan-1934                                       09/02/2008  JYNWG#:[id number]   The patient has undergone a left superficial femoral artery angioplasty  and stent 03/17/2008 for left lower extremity claudication.  He  initially had good relief of symptoms, and now has a recurrence of  symptoms.  Duplex reveals a stenosis proximal to the previously placed  stent along with an in-stent restenosis.   We will plan to go ahead with a left lower extremity arteriogram and  possible reintervention on 09/17/2008 at [facility]   Balinda Quails, M.D.  Electronically Signed   PGH/MEDQ  D:  09/02/2008  T:  09/03/2008  Job:  [id number]

## 2010-08-22 NOTE — Procedures (Signed)
BYPASS GRAFT EVALUATION   INDICATION:  Follow up left SFA stenting, claudication vanished per  patient.   HISTORY:  Diabetes:  No.  Cardiac:  CABG, stents.  Hypertension:  Yes.  Smoking:  No.  Previous Surgery:  Left superficial femoral artery PTA/stent 03/17/2008  by Dr. Madilyn Fireman.   SINGLE LEVEL ARTERIAL EXAM                               RIGHT              LEFT  Brachial:                    124                118  Anterior tibial:             103                119  Posterior tibial:            134                114  Peroneal:  Ankle/brachial index:        1.08               0.96   PREVIOUS ABI:  Date: 02/19/2008  RIGHT:  0.97  LEFT:  0.83   LOWER EXTREMITY BYPASS GRAFT DUPLEX EXAM:   DUPLEX:  1. Patent left common femoral artery, superficial femoral artery, and      popliteal artery as well as patent mid/distal SFA stent.  2. Elevated velocities of 316 cm/s in the proximal SFA suggestive of      >50% stenosis.   IMPRESSION:  1. Patent left superficial femoral artery stent.  2. Right ABI appears stable.  3. Left ABI shows increase from preop study.  4. Evidence of >50% stenosis in left proximal SFA.   ___________________________________________  P. Liliane Bade, M.D.   AS/MEDQ  D:  07/16/2008  T:  07/16/2008  Job:  [id number]

## 2010-08-22 NOTE — Assessment & Plan Note (Signed)
OFFICE VISIT   BREWSTER, WOLTERS  DOB:  Sep 29, 1933                                       04/15/2008  [id number]#:[id number]   The patient underwent lower extremity arteriography and left superficial  femoral artery PTA and stenting for short segment left superficial  femoral artery occlusion and associated left lower extremity  claudication.  He has recovered well from this procedure.  He is now  playing tennis without any symptoms.   His right groin is unremarkable.  Left leg reveals a 1+ dorsalis pedis  pulse.  BP is 111/67, pulse 81 per minute.   The patient has done well following his lower extremity procedure.  We  will plan followup with him in 6 months per routine protocol.   Balinda Quails, M.D.  Electronically Signed   PGH/MEDQ  D:  04/15/2008  T:  04/16/2008  Job:  [id number]   cc:   [name], M.D.

## 2010-08-23 ENCOUNTER — Telehealth: Payer: Self-pay | Admitting: Cardiology

## 2010-08-23 DIAGNOSIS — I119 Hypertensive heart disease without heart failure: Secondary | ICD-10-CM

## 2010-08-23 NOTE — Telephone Encounter (Signed)
rx taken of

## 2010-08-23 NOTE — Telephone Encounter (Signed)
Wants to know when his metoprolol will be called into [facility].  He said that they faxed over a request. He also has a question regarding his dosage.

## 2010-08-25 NOTE — Cardiovascular Report (Signed)
[name], [name]:  [id number]   MEDICAL RECORD NO.:  [id number]          PATIENT TYPE:  OIB   LOCATION:  6522                         FACILITY:  [facility]   PHYSICIAN:  Peter M. Swaziland, M.D.  DATE OF BIRTH:  31-Jan-1934   DATE OF PROCEDURE:  03/14/2004  DATE OF DISCHARGE:                              CARDIAC CATHETERIZATION   INDICATION FOR PROCEDURE:  The patient is a 77 year old white male status  post redo coronary artery bypass graft in 1994.  He presents with recurrent  exertional angina.  Cardiac catheterization demonstrated a new 70-80%  stenosis in the mid body of the vein graft to the right coronary artery.  He  is now brought back for coronary intervention.   ACCESS:  Via the left femoral artery using standard Seldinger technique.   EQUIPMENT:  6 French arterial sheath, 6 French right coronary artery bypass  guide, Filter wire EZ and a 3.0 x 13-mm Cypher drug-eluting stent.   MEDICATIONS:  Versed 1 mg IV, Angiomax bolus at 0.75 mg/kg followed by drip  at 1.75 mg/kg/hour, nitroglycerin 200 mcg intracoronary x1.   CONTRAST:  75 mL of Omnipaque.   HEMODYNAMIC DATA:  Arterial pressure was 101/50 with mean of 72 mmHg.  Remained stable throughout the procedure.   INTERVENTIONAL PROCEDURE:  After initial guide shots were obtained and  patient  was anticoagulated, the lesion of the body of the vein graft to the  right coronary artery was easily crossed with a filter wire.  The filter was  deployed in the distal vein graft.  We then primarily stented this lesion  using a 3.0 x 13-mm Cypher drug-eluting stent deploying at 11 atmospheres  and post dilating to 14 atmospheres.  This resulted in excellent  angiographic result with 0% residual stenosis with TIMI-3 flow.  The patient  did have mild throat tightness during balloon inflations.  He had no  complications.  The filter was then extracted with the wire sheath.  Examination revealed no significant  debris within the filter.   FINAL INTERPRETATION:  Successful stenting of the saphenous vein graft to  right coronary artery in the mid vein graft.       PMJ/MEDQ  D:  03/14/2004  T:  03/14/2004  Job:  [id number]   cc:   [name], M.D.  [address], [address]  [place]  [place] [address]  Fax: [phone]

## 2010-08-25 NOTE — H&P (Signed)
[place]. [facility]  Patient:    [name], [name] Visit Number: [id number] MRN: [id number]          Service Type: CAT Location: 6500 6527 02 Attending Physician:  Norman Clay Dictated by:   Darden Palmer., M.D. Admit Date:  10/09/2001 Discharge Date: 10/10/2001   CC:         Thomas A. Patty Sermons, M.D.   History and Physical  HISTORY OF PRESENT ILLNESS:  This 77 year old male has had a previous coronary artery bypass grafting redo surgery, last in 1994.  He was catheterized one week ago because of increasing angina, and was found to have a severe stenosis in the distal right coronary artery, after the insertion site of the vein graft to the right coronary artery.  He had an occlusion of the distal marginal branch, and a patent internal mammary graft noted.  After consideration  of the options, he had elected to have an angioplasty of the distal right coronary artery.  It was a somewhat small vessel and was felt to be small for stenting, but because of the progressive nature of his angina, we were going to try to do a cutting balloon angioplasty of this vessel, to see if we could give a relief of angina.  PAST MEDICAL HISTORY/FAMILY HISTORY/SOCIAL HISTORY/REVIEW OF SYSTEMS:  He has had no intercurrent medical changes since he was previously seen.  Please see the recently dictated history and physical.  PHYSICAL EXAMINATION  GENERAL:  He is a pleasant male who weighs 150 pounds.  VITAL SIGNS:  Blood pressure 130/70 sitting and standing, pulse 68.  LUNGS:  Clear.  CARDIAC:  Normal S1, S2.  No S3.  ABDOMEN:  Soft, nontender.  His right femoral pulse has a slight ecchymosis. Femoral pulses were 2+.  LABORATORY DATA:  PT and PTT are normal.  CBC is normal.  Chem-7 is pending at the time of dictation.  IMPRESSION 1. Coronary artery disease with a severe stenosis of the distal right    coronary artery, which is new since  previous catheterization of 1994. 2. Previous redo coronary artery bypass grafting. 3. Hyperlipidemia, under treatment.  RECOMMENDATION:  Brought in for same-day angioplasty of the distal right coronary artery.  The procedure was discussed with the patient fully, including the risks of myocardial infarction, death, CVA, emergency coronary artery bypass grafting, or restenosis, and its management.  He has been taking Plavix in preparation for this. Dictated by:   Darden Palmer., M.D. Attending Physician:  Norman Clay DD:  10/09/01 TD:  10/12/01 Job: [id number]

## 2010-08-25 NOTE — Discharge Summary (Signed)
NAMEANHAD, SHEELEY NO.:  [id number]   MEDICAL RECORD NO.:  [id number]          PATIENT TYPE:  INP   LOCATION:  6531                         FACILITY:  [facility]   PHYSICIAN:  Peter M. Swaziland, M.D.  DATE OF BIRTH:  September 24, 1933   DATE OF ADMISSION:  10/18/2005  DATE OF DISCHARGE:  10/19/2005                                 DISCHARGE SUMMARY   HISTORY OF PRESENT ILLNESS:  [name] is a 77 year old white male with a  known history of coronary disease status post CABG in 1982 with redo CABG in  1997.  He previously had stenting of the vein graft to the right coronary  artery in December 2005.  He presents now with symptoms of increased angina  which is predominantly a symptom of tightness in his throat.  He had a  stress Cardiolite study which showed fairly extensive lateral wall ischemia.  He was admitted for further evaluation.  For details of his past medical  history, social history, family history, physical examination please see  admission history and physical.   HOSPITAL COURSE:  Patient was brought to the cardiac catheterization  laboratory.  Diagnostic cardiac catheterization demonstrated occlusion of  the left main coronary artery and proximal right coronary artery.  The vein  graft to the distal right coronary artery was widely patent including the  prior stented segment.  The LIMA graft to the diagonal and LAD was widely  patent.  There was moderately severe disease in the distal LAD in a very  small caliber vessel that was chronic.  The saphenous vein graft to the  first and second obtuse marginal vessels had a severe 90% stenosis  proximally and a 95% stenosis in the mid vessel with subsequently an 80%  focal stenosis after the takeoff of the first marginal vessel.  We proceeded  to stent the vein graft to the marginal vessels.  The proximal lesion was  stented using a 3 x 18 mm Cypher stent and the mid vessel lesion was stented  using a 3 x 13 mm Cypher  stent.  This restored excellent antegrade flow.  The limb of the vein graft to the second marginal vessel was just treated  with balloon angioplasty with a 2.5 mm balloon.  This also yielded an  acceptable result.  Post procedure patient did well.  He had no recurrent  symptoms.  His ECG was stable showing incomplete right bundle branch block,  but no acute ST-T wave changes.  His CK follow-up was 51 with 2.4 MB.  Troponin was mildly elevated at 0.23.  White count and platelets were  normal.  His hemoglobin was 12.6.  Patient was ambulatory and had no groin  complications.  It was felt that he was stable for discharge the following  day.  The mild elevation in his troponin was felt to be consistent with the  complexity of procedure.  Patient was maintained on aspirin and Plavix.   DISCHARGE DIAGNOSIS:  1.  Recurrent unstable angina.  2.  Coronary disease with previous redo coronary artery bypass surgery in  1997.  Status post stenting of the vein graft to the right coronary      artery.  Now with stenting of the vein graft to the first and second      obtuse marginal vessel.  3.  Hypertension.  4.  Hyperlipidemia.   DISCHARGE MEDICATIONS:  Patient will remain on his prior medications which  include a coated aspirin 325 mg per day, Plavix 75 mg per day, atenolol 25  mg b.i.d., Vytorin 10/80 mg q.h.s., Benadryl 25 mg q.h.s., Foltx one tablet  daily, multivitamin daily, Niaspan 500 mg q.h.s., and a blood pressure study  medication that he is taking  Rowan Blase.  Patient will follow up with Dr. Ronny Flurry in two weeks.  He is to avoid heavy lifting or straining for one week.   DISCHARGE STATUS:  Improved.           ______________________________  Peter M. Swaziland, M.D.     PMJ/MEDQ  D:  10/19/2005  T:  10/19/2005  Job:  [id number]   cc:   [name], M.D.  Fax: [phone]

## 2010-08-25 NOTE — Discharge Summary (Signed)
[name], [name]:  [id number]   MEDICAL RECORD NO.:  [id number]          PATIENT TYPE:  OIB   LOCATION:  6522                         FACILITY:  [facility]   PHYSICIAN:  Peter M. Swaziland, M.D.  DATE OF BIRTH:  02/29/1934   DATE OF ADMISSION:  03/14/2004  DATE OF DISCHARGE:  03/15/2004                                 DISCHARGE SUMMARY   HISTORY OF PRESENT ILLNESS:  [name] is a 77 year old white male with  known coronary disease status post redo CABG in 1994.  He presented with  recurrent anginal symptoms.  Coronary angiography demonstrated patent  grafts, but he had an 80% eccentric stenosis in the body of the vein graft  to the right coronary artery.  He was brought back today for stenting  procedure.  The patient was also noted to have fairly extensive diffuse  disease in the mid to distal LAD that is not amenable to intervention.  For  details of past medical history, social history, family history and physical  examination, please see recent dictated H&P.   HOSPITAL COURSE:  The patient was brought to the cardiac catheterization  lab.  He underwent stenting of the saphenous vein graft to the right  coronary artery.  This was performed using a filter wire.  We placed a  3.0x13 mm Cypher drug eluting stent and deployed this up to 14 atmospheres.  This showed an excellent angiographic result with 0% residual stenosis and  TIMI III flow.  The patient tolerated the procedure well without  complications.  He had no groin hematoma.  He was discharged home the  following day in stable condition.   DISCHARGE DIAGNOSES:  1.  Coronary artery disease status post coronary artery bypass graft now      with recurrent graft disease.  2.  Hyperlipidemia.   DISCHARGE MEDICATIONS:  1.  Plavix 75 mg daily.  2.  Vytorin 10/80 mg daily.  3.  Foltx one tablet daily.  4.  Multivitamin daily.  5.  Atenolol 25 mg b.i.d.  6.  Aspirin 325 mg daily.   DISCHARGE  INSTRUCTIONS:  He is to avoid heavy lifting or straining for five  days.  He will remain on a low-fat diet.  He will schedule a follow up  appointment with Dr. Patty Sermons in two weeks.   CONDITION ON DISCHARGE:  Improved.       PMJ/MEDQ  D:  03/15/2004  T:  03/15/2004  Job:  [id number]   cc:   [name], M.D.  [address]  [place]  [place] [address]  Fax: [phone]

## 2010-08-25 NOTE — Cardiovascular Report (Signed)
[place]. [facility]  Patient:    [name], [name] Visit Number: [id number] MRN: [id number]          Service Type: CAT Location: 6500 6527 02 Attending Physician:  Norman Clay Dictated by:   Darden Palmer., M.D. Proc. Date: 10/09/01 Admit Date:  10/09/2001 Discharge Date: 10/10/2001   CC:         Thomas A. Patty Sermons, M.D.   Cardiac Catheterization  PROCEDURE:  Percutaneous transluminal coronary angioplasty with cutting balloon of distal right coronary artery with saphenous vein graft.  INDICATIONS:  Limiting angina in a patient with significant coronary disease.  CARDIOLOGIST:  Darden Palmer., M.D.  DESCRIPTION OF PROCEDURE:  The patient was brought to the catheterization lab and was prepped and draped in the usual manner.  After Xylocaine anesthesia, a 7-French sheath was placed in the right femoral artery percutaneously. Angiograms were made using a 7-French JR4 bypass catheter.  Heparin 3400 units was administered IV with an ACT of 248.  Integrilin was begun with double bolus and constant infusion technique.  Nitroglycerin was started.  A high-torque floppy J guidewire crossed the lesion without difficulty.  I attempted to pass a 2.0 x 10 mm cutting balloon, but this would not cross, and the lesion was then predilated with a 2.0 x 15 CrossSail balloon inflated to 8 atmospheres.  The 2.0 x 10 mm cutting balloon then crossed the lesion and was dilated to 10 atmospheres.  There was thought to be incomplete expansion proximally, and I went in with a 2.5 x 6 mm cutting balloon proximally, being careful to stay out of the distal vessel with this. Following this, there was an excellent angiographic result, and post dilatation angiograms were obtained.  The sheath was sutured in place, and the patient was returned to the angioplasty care center in stable condition.  ANGIOGRAPHIC DATA:  Right coronary artery:   Predilatation, the vein graft has luminal irregularities noted.  There is a severe eccentric 95% stenosis at the bifurcation of the continuation branch and a posterolateral branch.  The posterior descending is occluded.  Post dilatation angiograms of this area reveal less than 20% residual stenosis with preservation of all side branches and no dissection.  IMPRESSION:  Successful angioplasty of the distal right coronary artery through the saphenous vein graft. Dictated by:   Darden Palmer., M.D. Attending Physician:  Norman Clay DD:  10/09/01 TD:  10/13/01 Job: [id number]

## 2010-08-25 NOTE — H&P (Signed)
[place]. [facility]  Patient:    [name], [name] Visit Number: [id number] MRN: [id number]          Service Type: CAT Location: [facility] 2860 01 Attending Physician:  Norman Clay Dictated by:   Darden Palmer., M.D. Admit Date:  10/02/2001 Discharge Date: 10/02/2001   CC:         Thomas A. Patty Sermons, M.D.   History and Physical  HISTORY:  The patient is a 77 year old male who is brought in for elective cardiac catheterization.  The patient has a prior history of coronary artery bypass grafting in 1982 and again had redo bypass grafting in 1994 for severe coronary artery disease.  He had an angioplasty of the posterior descending artery which was dilated at that time.  He had severe left main disease, severe saphenous vein graft disease involving the LAD diagonal graft, obtuse marginal graft, occlusion of the second marginal graft at that time.  He had bypass grafting by Dr. Andrey Campanile with a sequential vein graft to the first obtuse marginal artery and distal circumflex and to the right coronary artery which was widely patent.  Internal mammary graft was patent but somewhat atretic. Sequential vein graft was an old graft placed in 1982 and showed a moderate 60 to 70% stenosis to the diagonal.  The right coronary artery was diffusely diseased.  LV function was normal.  He has been followed medically since that time but has had some worsening exertional angina which has become more progressive.  He recently had a Cardiolite scan showing ischemia of the basilar anteroseptal area, transient ischemic dilation, and an EF of 48%.  He continued to have significant tightness, particularly when he plays tennis or does most any activity.  Because of his abnormal Cardiolite scan, Dr. Patty Sermons wished [place] to do repeat catheterization on him.  PAST MEDICAL HISTORY:  Remarkable for significant dyslipidemia.  There is no known history of hypertension.  No  diabetes.  PAST SURGICAL HISTORY:  Bypass grafting x 4 in 1982, redo bypass grafting x 3 in 1994.  Appendectomy, tonsillectomy, tendon surgery on the right elbow.  ALLERGIES:  TETRACYCLINE.  CURRENT MEDICATIONS: 1. Aspirin daily. 2. Norvasc 5 mg daily. 3. Atenolol 12.5 mg daily. 4. Multivitamins daily. 5. Zocor 80 mg daily. 6. CoQ10 daily. 7. Flax seed daily. 8. ______  10 mg daily.  FAMILY HISTORY:  Father died at age 2 of a heart attack.  Mother is 65.  One brother is 58.  One son and two daughters are healthy.  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  He walks, does aerobics, plays tennis and golf.  He quit smoking 30 years ago, drinks rare alcohol, occasional caffeine.  He attends [employer].  REVIEW OF SYSTEMS:  Occasional mild impotence.  No GI symptoms.  No claudication, no edema.  Other than as noted above, the remainder of the Review of Systems unremarkable.  PHYSICAL EXAMINATION:  GENERAL:  A very pleasant male.  VITAL SIGNS:  Weight 150 pounds.  Blood pressure 130/80 sitting, 132/80 standing, pulse 70.  SKIN:  Warm and dry.  NECK:  No JVD, thyromegaly, mass, or bruits.  LUNGS:  Clear to auscultation and percussion.  CARDIAC:  Normal S1 and S2, no S3.  ABDOMEN:  Soft, nontender.  No hepatosplenomegaly, mass, or aneurysm.  EXTREMITIES:  Femoral distal pulses present and 2+.  DIAGNOSTIC DATA:  A 12-lead electrocardiogram shows right bundle branch block, superior  axis.  IMPRESSION: 1. Worsening angina in patient with redo bypass grafting.  Rule out graft    disease. 2. Coronary artery disease. 3. Dyslipidemia under treatment.  RECOMMENDATIONS:  He is brought in for same-day catheterization.  The procedure was discussed with the patient fully including risks, and he is willing to proceed. Dictated by:   Darden Palmer., M.D. Attending Physician:  Norman Clay DD:   09/30/01 TD:  10/01/01 Job: [id number]

## 2010-08-25 NOTE — H&P (Signed)
NAMEBLISS, TSANG NO.:  [id number]   MEDICAL RECORD NO.:  [id number]           PATIENT TYPE:   LOCATION:                                 FACILITY:   PHYSICIAN:  Peter M. Swaziland, M.D.       DATE OF BIRTH:   DATE OF ADMISSION:  10/18/2005  DATE OF DISCHARGE:                                HISTORY & PHYSICAL   HISTORY OF PRESENT ILLNESS:  [name] is a very pleasant 77 year old white  male with known history of coronary disease, who presents with increasing  symptoms of angina.  He states this began approximately 2 weeks ago and he  began experiencing his typical symptoms of throat pressure and discomfort.  This is typically related to exertion and is relieved with nitroglycerin.  To further evaluate his symptoms Dr. Patty Sermons performed a stress Cardiolite  study on October 15, 2005.  The patient's exercise tolerance was reduced to 7  minutes, whereas previously he had walked 10-1/2 min.  He had significant  ECG changes and significant anginal symptoms, requiring nitroglycerin for  relief.  His Cardiolite images demonstrated ischemia in the distal anterior  wall and extensive lateral wall ischemia.  His ejection fraction had  declined to 44%.  Because of his new onset of symptoms and abnormal stress  test, he is now admitted for cardiac catheterization.   His prior cardiac history is significant for previous coronary bypass  surgery initially in 1982.  He had redo coronary artery bypass surgery in  1994.  In December 2005 the patient underwent stenting of the vein graft to  the right coronary, using a 3.0 x 13 mm Cypher drug-eluting stent.  His  other grafts were patent at that time.  He was noted to have significant  disease in the distal LAD territory, that was not felt to be amenable to  intervention.   PAST MEDICAL HISTORY.:  1.  Coronary Artery Disease.  Status post CABG in 1982.  Subsequent redo      CABG x3 in 1994; this included:  Saphenous vein graft  to the distal      right coronary distribution, saphenous vein graft to the first and      second obtuse marginal vessels, and a LIMA graft to the diagonal and mid      LAD.  2.  Hyperlipidemia.  3.  Hypertension.   PRIOR SURGERIES:  1.  Appendectomy.  2.  Tonsillectomy and adenoidectomy.  3.  Bilateral cataract surgery.  4.  Tendon surgery of his left elbow.   ALLERGIES:  TETRACYCLINE.   CURRENT MEDICATIONS:  1.  Aspirin 81 mg per day.  2.  Atenolol 25 mg b.i.d.  3.  Plavix 75 mg per day.  4.  Vytorin 10/80 mg q.h.s.  5.  Benadryl 25 mg q.h.s.  6.  Foltex one daily.  7.  Multivitamin daily.  8.  Niaspan 500 mg q.h.s.  9.  He is on a blood pressure study medication through [facility].   FAMILY HISTORY:  Father died at age  47 of myocardial infarction.   SOCIAL HISTORY:  The patient is married.  He has three children.  He has not  smoked in 36 years.  He denies alcohol use.   REVIEW OF SYSTEMS:  He denies any claudication; has had no recent TIA or CVA  symptoms.  He denies any orthopnea, PND or increased edema.  No recent  change in bowel or bladder habits.  Other review of systems are negative.   PHYSICAL EXAMINATION:  GENERAL:  The patient is a well-developed white male  in no distress.  VITAL SIGNS:  Weight 146, blood pressure 142/80, pulse is 60 and regular.  HEENT:  Normocephalic, atraumatic.  Pupils are equal, round, reactive to  light and accommodation.  Extraocular movements were intact.  Oropharynx is  clear.  NECK:  Supple without JVD, adenopathy, thyromegaly or bruits.  LUNGS:  Clear.  CARDIAC:  Reveals a regular rate and rhythm.  Normal S1-S2 with a grade 1/6  systolic ejection murmur at the left sternal border.  ABDOMEN:  Soft,  nontender without masses or hepatosplenomegaly.  EXTREMITIES:  Femoral and pedal pulses were 2+ and symmetric.  He has no  edema.  NEUROLOGIC:  Intact.   LABORATORY DATA:  ECG demonstrates normal sinus  rhythm with incomplete right  bundle branch block.  CHEST X-RAY:  Shows mild cardiomegaly with no active  disease.  Coags were normal.  BUN 22, creatinine 0.9, glucose 153.  Electrolytes were normal.  White count 6000, hemoglobin 13.7, hematocrit 37.   FINAL ASSESSMENT:  1.  New onset angina with significantly abnormal stress Cardiolite study.  2.  Extensive history of coronary artery disease, status post initial      coronary artery bypass surgery in 1982.  Redo coronary artery bypass      surgery in 1994.  Prior stenting of vein graft to the right coronary in      December 2005.  3.  Hyperlipidemia.  4.  Hypertension.   PLAN:  Will proceed with diagnostic cardiac catheterization, with potential  coronary intervention if required.           ______________________________  Peter M. Swaziland, M.D.     PMJ/MEDQ  D:  10/15/2005  T:  10/15/2005  Job:  [id number]   cc:   [name], M.D.  Fax: [phone]

## 2010-08-25 NOTE — H&P (Signed)
NAMEDONNA, [name]:  [id number]   MEDICAL RECORD NO.:  [id number]          PATIENT TYPE:  INP   LOCATION:                               FACILITY:  [facility]   PHYSICIAN:  Peter M. Swaziland, M.D.  DATE OF BIRTH:  1933/10/23   DATE OF ADMISSION:  03/09/2004  DATE OF DISCHARGE:                                HISTORY & PHYSICAL   HISTORY OF PRESENT ILLNESS:  [name] is a 77 year old white male who has  a history of coronary artery disease. He is status post CABG initially in  1982 and had redo bypass surgery 1994. He presents at this time with a 2 to  3 month history of exertional angina. He describes this as a tightness in  his throat without associated chest pain, shortness of breath, nausea,  vomiting, or diaphoresis. Usually, if he warms up well prior to exercise,  this will prevent him from getting throat tightness but he does feel that it  has worsened over the past 2 to 3 months. He subsequently underwent a stress  Cardiolite study on 02/28/04. This demonstrated typical symptoms of angina  at 10-1/2 minutes of exercise. He had lateral ST segment depression.  Cardiolite images showed reversible anterior septal ischemia with an  ejection fraction of 59%. He is now admitted for angiography. The patient  did undergo cutting balloon angioplasty of the saphenous vein graft  insertion into the right coronary artery in early 2003. His last cardiac  catheterization on October 31, 2001 showed total occlusion of the native left  main and proximal right coronary artery. The saphenous vein graft to the  distal right coronary artery was patent. There was also patent sequential  saphenous vein graft to the first and second obtuse marginal vessels. The  LIMA graft to the LAD was patent. He was noted to have fairly diffuse distal  disease in the LAD as well as occlusion of the second marginal vessel filled  by collaterals. He has undergone prior EECP therapy for angina in 2003.  He  appeared to get some temporary benefit from that.   PAST MEDICAL HISTORY:  1.  CAD as detailed above.  2.  Hyperlipidemia.  3.  Hypertension.   PAST SURGICAL HISTORY:  1.  Appendectomy.  2.  T&A.  3.  Bilateral cataract surgery.  4.  Tendon surgery on his left elbow.   ALLERGIES:  TETRACYCLINE.   CURRENT MEDICATIONS:  1.  Aspirin daily 81 mg.  2.  Plavix 75 mg per day.  3.  Vytorin 10/80 mg q.h.s.  4.  Benadryl 25 mg q.h.s.  5.  Foltx one daily.  6.  Multivitamin daily.  7.  Atenolol 25 mg b.i.d.   SOCIAL HISTORY:  The patient is married. He has three children. He quit  smoking over 35 years ago. Denies alcohol use. He is very active and enjoys  playing tennis.   FAMILY HISTORY:  [name] died at age 56 with myocardial infarction. He has  one brother who is in good health.   REVIEW OF SYSTEMS:  He denies any claudication. He  has had no history of TIA  or stroke. No recent bowel or bladder complaints. He is also taking a study  medication for blood pressure from [facility]. His review of systems is  otherwise unremarkable.   PHYSICAL EXAMINATION:  GENERAL:  The patient is a well-developed, white male  in no apparent distress.  VITAL SIGNS:  Blood pressure is 118/70, pulse 60 and regular, weight is 152.  HEENT:  Unremarkable.  NECK:  He has no bruits or JVD.  LUNGS:  Clear.  CARDIAC:  Regular rate and rhythm with a grade 1/6 systolic ejection murmur.  ABDOMEN:  Soft, nontender. There are no masses or edema.  EXTREMITIES:  He has no lower extremity edema. Pulses are 2+ and symmetric.  There is no cyanosis.  NEUROLOGIC:  Nonfocal.   LABORATORY DATA:  ECG shows normal sinus rhythm, right bundle branch block.  His chest x-ray shows no active disease. Coagulations were normal. BUN 21,  creatinine 0.9, sodium 142, potassium 4.2, chloride 104, CO2 29, and glucose  of 95. CBC is normal.   IMPRESSION:  1.  Recurrent exertional angina with abnormal Cardiolite study.  2.   Status post redo coronary artery bypass grafting in 1994 with subsequent      angioplasty of distal vein graft to the right coronary artery.  3.  Hypertension.  4.  Hyperlipidemia.   PLAN:  We will proceed with cardiac catheterization to see what his options  are for treatment.        ___________________________________________  Peter M. Swaziland, M.D.    PMJ/MEDQ  D:  03/08/2004  T:  03/08/2004  Job:  [id number]   cc:   [name], M.D.  [address], [address]  [place]  [place] [address]  Fax: [phone]

## 2010-08-25 NOTE — H&P (Signed)
[facility]  Patient:    Carlos, Gregory Visit Number: [id number] MRN: [id number]          Service Type: CAT Location: [facility] 2899 11 Attending Physician:  Norman Clay Dictated by:   Darden Palmer., M.D. Admit Date:  10/31/2001 Discharge Date: 10/31/2001   CC:         Thomas A. Patty Sermons, M.D.   History and Physical  HISTORY OF PRESENT ILLNESS:  This 77 year old male has had previous coronary bypass grafting and redo surgery, last in 1994.  He was catheterized in June with increasing angina and was found to have a severe stenosis of the distal right coronary artery at the insertion site of the vein graft to the right coronary artery.  He also had occlusion of the distal marginal branch and some diagonal branches that were in jeopardy also.  He had a cutting balloon angioplasty done of the distal stenosis to see if would relieve any angina. He went home and following this had some mild angina on a walk the next couple of days.  He has since attempted to play tennis and has had angina while playing tennis and also had significant angina on walking up a significant hill.  Because of the recurrence of angina, worsening angina, a repeat catheterization is being done to assess the angioplasty site and to determine if he would need any additional therapy or whether he should just be treated medically.  PAST MEDICAL HISTORY:  Remarkable for dyslipidemia.  No known history of hypertension, no diabetes.  PAST SURGICAL HISTORY:  Bypass grafting x4 in 1982, redo bypass grafting in 1994, appendectomy, tonsillectomy, tendon surgery on the right elbow.  ALLERGIES:  TETRACYCLINE.  MEDICATIONS:  Aspirin daily, Norvasc 5 mg daily, atenolol 25 mg b.i.d., Zocor 80 mg daily, Zetia 10 mg daily, Plavix half of a 75 mg pill daily.  FAMILY AND SOCIAL HISTORY:  recorded in a previously-dictated history and physical dated October 02, 2001,  reviewed, and are unchanged.  REVIEW OF SYSTEMS:  Mild impotence.  No GI symptoms.  No claudication.  Other than the above, the remainder of the review of systems is unremarkable.  PHYSICAL EXAMINATION:  VITAL SIGNS:  Weight 151, blood pressure 132/80 sitting, 130/74 standing, pulse 55.  GENERAL:  A pleasant male appearing stated age.  SKIN:  Warm and dry.  NECK:  No JVD, thyromegaly, or bruits.  CHEST:  Lungs clear to A&P.  CARDIAC:  Normal S1, S2.  No S3, S4, or murmur.  ABDOMEN:  Soft, nontender, no hepatosplenomegaly, mass, or aneurysm.  EXTREMITIES:  Distal pulses 2+.  LABORATORY DATA:  A 12-lead ECG shows right bundle branch block.  IMPRESSION: 1. Recurrent angina following angioplasty of the distal right coronary artery    through the vein graft. 2. Coronary artery disease with previous bypass. 3. Dyslipidemia.  RECOMMENDATIONS:  Brought in for same-day cardiac catheterization.  The procedure was discussed with him fully, including risks of death or CVA, and he is agreeable and willing to proceed. Dictated by:   Darden Palmer., M.D. Attending Physician:  Norman Clay DD:  10/29/01 TD:  11/02/01 Job: [id number]

## 2010-08-25 NOTE — Cardiovascular Report (Signed)
[name], TAT NO.:  [id number]   MEDICAL RECORD NO.:  [id number]          PATIENT TYPE:  INP   LOCATION:  2807                         FACILITY:  [facility]   PHYSICIAN:  Peter M. Swaziland, M.D.  DATE OF BIRTH:  1933/12/03   DATE OF PROCEDURE:  10/18/2005  DATE OF DISCHARGE:                              CARDIAC CATHETERIZATION   PROCEDURE:  Cardiac catheterization and intervention procedure note.   INDICATIONS FOR PROCEDURE:  A 77 year old white male with a history of  coronary disease, status post initial bypass surgery in 1982.  He had redo  coronary bypass surgery in 1997. He subsequently had stenting of the vein  graft to the right coronary in December 2005.  He now presents with symptoms  of increased dyspnea on exertion, had a markedly abnormal stress Cardiolite  study showing an extensive lateral wall ischemia as well as apical ischemia.   PROCEDURES:  Left heart catheterization, coronary and left ventricular  angiography, saphenous vein graft angiography x2, LIMA graft angiography,  and intracoronary stenting of the saphenous vein graft to the first and  second obtuse marginal vessels.  Access is via the right femoral artery  using the standard Seldinger technique.   EQUIPMENT USED:  A 6-French 4 cm right and left Judkins catheter, 6-French  pigtail catheter, 6-French LIMA catheter, a 6-French AL-1 guide with side  holes, a filter wire EZ, a 0.014 high-torque floppy wire, and a 0.014  Prowater wire, a 3.0 x 12 mm Maverick balloon, a 2.5 x 12-mm Maverick  balloon.  A 3.0 x 18 mm Cypher stent and a 3.0 x 13 mm Cypher stent.  Contrast 335 mL of Omnipaque.   MEDICATIONS:  Nitroglycerin intracoronary 100 mcg x1; and then 200 mcg x1.  Angiomax bolus at 0.75 mg/kg with a continuous drip at 1.75 mg per kilogram  per hour.  Subsequent ACT was 350.   HEMODYNAMIC DATA:  Aortic pressure is 125/59 with a mean of 87.  Left  ventricular pressure is 124 with an EDP  of 21 mmHg.   ANGIOGRAPHIC DATA:  1.  The left main coronary artery is occluded proximally.  2.  The right coronary artery is occluded proximally.  3.  The saphenous vein graft sequentially to the first and second obtuse      marginal vessels is severely diseased.  There is a 90% eccentric and      friable lesion in the proximal vein graft.  There is then a severely      eccentric stenosis, 95%, prior to the insertion into the first obtuse      marginal vessel.  There is then a focal 80% stenosis in the continuation      of the vein graft to the second obtuse marginal vessel.  This portion of      the graft is considerably smaller; and the second obtuse marginal vessel      is very tiny and diffusely disease.  4.  The saphenous vein graft to the distal right coronary artery is widely      patent.  It has minor irregularities  less than or equal to 20%.  The      stent in the mid vessel is widely patent.  There is good runoff.  5.  The LIMA graft is seen to supply sequentially the first diagonal branch      and the mid-LAD.  It is widely patent.  The LAD distal to the graft is      severely diseased and diffusely small in caliber with stenosis up to 80      to 90%  in one segment.   LEFT VENTRICULAR ANGIOGRAPHY:  Performed in RAO view demonstrates normal  left ventricular size and contractility with normal systolic function.  Ejection fraction is estimated at 60%.  There is mild mitral insufficiency.   We proceeded, at this point, with intervention of the saphenous vein graft  to the first and second marginal vessels.  The most severe lesions were the  lesions in the proximal vein graft; and in the mid-vein graft just prior to  the first obtuse marginal vessel.  As this is a 75 year old vein graft, we  planned to do a staged procedure to provide maximal distal protection.  We  initially started with a filter wire and stenting of the proximal vein  graft.  The filter wire was deployed in  the mid-vein graft significantly  proximal to the second lesion.  We then predilated the proximal lesion using  a 3.0 x 12 mm Maverick balloon to 6 atmospheres x2.  We then stented this  segment using a 3.0 x 18 mm Cypher stent deploying this at 11 atmospheres,  and then postdilating it to 14 atmospheres.  This yielded an excellent  angiographic result without significant residual stenosis.  We then  retrieved the filter from the mid-vein graft.   We next attempted to place a Proxis distal protection device in the proximal  vein graft to intervene on the second lesion.  However, we were unable to  pass the Proxis catheter through the proximal stent, despite even being able  to advance a wire across the stent, this still did not allow the catheter to  passed.  Therefore, we aborted using the Proxis device and proceeded with  intervention in a standard fashion on the second lesion.  A high-torque  floppy wire was passed down the first obtuse marginal vessel.  We predilated  this lesion with a 3.0 mm Maverick balloon to 6 atmospheres.  We then  stented this lesion using a 3.0 x 13 mm Cypher stent deployed this at 11  atmospheres; and then post dilating to 14 atmospheres.  This yielded an  excellent angiographic result with excellent flow down the vein graft and  the first marginal vessel.  We then addressed the continuation of the vein  graft to the second marginal vessel.  We were unable to cross this lesion  with the high-torque floppy wire so we exchanged for a Prowater wire; and  were able to pass the wire down the continuation of the vein graft.  We  performed a single balloon inflation across this lesion using a 2.5 x 12 mm  Maverick balloon to 8 atmospheres.  This yielded an acceptable angiographic  result with less than 30% residual stenosis, and excellent flow.   FINAL INTERPRETATION: 1.  Left main and right coronary occlusive disease.  2.  Patent LIMA graft to the LAD and diagonal  vessels.  Moderately severe      diffuse disease in the distal LAD.  3.  Patent saphenous vein  graft to the distal right coronary including the      prior stented segment.  4.  Severe multilesion disease in the saphenous vein graft to the first and      second obtuse marginal vessel.  5.  Normal left ventricular function.  6.  Successful intracoronary stenting of the proximal and mid saphenous vein      graft to the first and second obtuse marginal vessels with further      balloon angioplasty of the more distal segment.           ______________________________  Peter M. Swaziland, M.D.     PMJ/MEDQ  D:  10/18/2005  T:  10/18/2005  Job:  [id number]   cc:   [name], M.D.  Fax: [phone]

## 2010-08-25 NOTE — Cardiovascular Report (Signed)
[name], [name]:  [id number]   MEDICAL RECORD NO.:  [id number]          PATIENT TYPE:  OIB   LOCATION:  6501                         FACILITY:  [facility]   PHYSICIAN:  Peter M. Swaziland, M.D.  DATE OF BIRTH:  26-Jun-1933   DATE OF PROCEDURE:  03/09/2004  DATE OF DISCHARGE:                              CARDIAC CATHETERIZATION   INDICATIONS FOR PROCEDURE:  77 year old white male with a history of  coronary artery disease status post initial coronary artery bypass surgery  in 1982, repeat CABG in 1994, prior cutting balloon angioplasty of the  saphenous vein graft anastomosis to the right coronary artery in 2003, now  presents with recurrent anginal symptoms.   PROCEDURE:  Left heart catheterization and left ventricular angiography,  saphenous vein graft angiography x 2, LIMA graft angiography.   ACCESS:  Right femoral artery using standard Seldinger technique.   EQUIPMENT USED:  4 French 4 cm right and left Judkins catheter, 4 French  pigtail catheter, 4 French LIMA catheter, 4 French right coronary bypass  catheter.   CONTRAST:  125 mL Omnipaque.   MEDICATIONS:  Local anesthesia with 1% Xylocaine, Valium 10 mg was given  prior to the procedure p.o.   HEMODYNAMIC DATA:  Aortic pressure 112/52 with a mean of 76 mmHg.  Left  ventricular pressure 118 with an EDP of 13 mmHg.   CORONARY ANGIOGRAPHY:  The left coronary artery arises normal.  The left  main coronary artery is occluded proximally.   The right coronary artery is difficult to engage directly, it has subtotal  occlusion at its origin and is totally occluded proximally.   The saphenous vein graft to the distal right coronary artery is patent.  There is an eccentric 70-80% stenosis in the mid body of the vein graft.  The distal vein graft at the anastomosis is still widely patent with less  than 30% residual stenosis.  This fills two moderate size posterolateral  branches.  The PDA is tiny,  diffusely diseased, and occluded.  There are  faint collaterals to the distal left circumflex coronary artery from the  right coronary artery.   The saphenous vein graft to the first and second obtuse marginal vessels is  patent.  The first marginal vessel is a moderate size vessel and is without  significant disease.  The second marginal vessel is very small and occluded  after the insertion of the vein graft.  It does fill via mild left  collaterals from the LAD.   The LIMA graft is a large graft and widely patent.  It supplies sequentially  the first diagonal and the mid LAD.  The mid and distal LAD is small and  diffusely diseased up to 80-90%.  There are no focal stenoses.   The left ventricular angiography is performed in the RAO view.  This  demonstrates normal left ventricular size and contractility with normal  systolic function.  Ejection fraction is estimated at 65%.  There is no  mitral regurgitation or prolapse.   FINAL INTERPRETATION:  1.  Severe native occlusive coronary artery disease with occlusion of  the      left main and proximal right coronary artery.  2.  Patent saphenous vein graft to the first and second obtuse marginal      vessels.  The native second marginal vessel is occluded.  3.  Patent saphenous vein graft to the distal right coronary artery.  There      is a moderate eccentric stenosis in the mid body of the vein graft.  The      prior intervention site in the distal vein graft is still patent.  The      PDA is small and occluded.  4.  Patent LIMA graft to the first diagonal and LAD.  The distal LAD is      severely and diffusely diseased.  5.  Normal left ventricular function.   PLAN:  The stenosis in the mid body of the vein graft to the right coronary  artery is suitable for stenting.  The most likely other potential area for  ischemia is the LAD territory which is not amenable for repeat intervention.  I would recommend stenting of the vein graft  to the right coronary artery.  Otherwise, would treat with aggressive medical therapy.       PMJ/MEDQ  D:  03/09/2004  T:  03/09/2004  Job:  [id number]   cc:   [name], M.D.  [address], [address]  [place]  [place] [address]  Fax: [phone]

## 2010-08-25 NOTE — Cardiovascular Report (Signed)
NAMEBENSEN, CHADDERDON                          ACCOUNT NO.:  [id number]   MEDICAL RECORD NO.:  [id number]                   PATIENT TYPE:  OIB   LOCATION:  2899                                 FACILITY:  [facility]   PHYSICIAN:  W. Ashley Royalty., M.D.         DATE OF BIRTH:  [birth year]   DATE OF PROCEDURE:  10/31/2001  DATE OF DISCHARGE:  10/31/2001                              CARDIAC CATHETERIZATION   HISTORY:  A 77 year old male with previous bypass grafting twice who has  developed recurrent angina following a recent angioplasty of the distal  right coronary artery.   COMMENTS ABOUT PROCEDURE:  The patient tolerated the procedure well without  complications.  The grafts were selected using the right coronary artery  catheter.  He tolerated the procedure well.  Please see the attached  catheterization log for the remainder of the details.   HEMODYNAMIC DATA:  Aorta post contrast 137/58, LV post contrast 137/18.   ANGIOGRAPHIC DATA:  Left Ventriculogram:  Performed in the 30 degree RAO  projection.  The aortic valve was normal.  The mitral valve was normal.  The  left ventricle appears normal in size.  The estimated ejection fraction was  60%.  Coronary arteries arise and distribute normally.  Calcifications seen  in the left and right coronary systems.  The left main coronary artery is  occluded proximally.  All of the distal left coronary system fills by the  bypass grafts.  Right coronary artery is occluded proximally.   Saphenous vein graft to the distal right coronary artery is widely patent  with irregularities noted.  The previous angioplasty site of the distal  right coronary artery is widely patent with an excellent result with very  minimal residual stenosis.  The posterior descending artery fills very  faintly with antegrade flow and is occluded proximal to this angioplasty  site.  Distal vessel fills through collaterals from the internal mammary  artery.  The  sequential saphenous vein graft to the OM 1 and OM 2 is widely  patent.  The second marginal branch is totally occluded after the insertion  site and fills by faint collaterals.   Internal mammary graft to the diagonal LAD is widely patent with both distal  anastomotic sites patent.  The distal LAD and the diagonal are diffusely  diseased and somewhat small caliber.   IMPRESSION:  1. Patent short-term angioplasty site of the distal right coronary artery     through the vein graft.  2. Patent saphenous grafts and internal mammary graft.  3. Occlusion of native coronaries.  4. Potential sites of ischemia exist in the posterior descending artery, the     distal left anterior descending, and     diagonal distributions due to diffuse disease, proximal diagonal branches     and the second marginal branch, none of which are suitable for     percutaneous intervention.   RECOMMENDATIONS:  Consideration of EECP.  Darden Palmer., M.D.    WST/MEDQ  D:  10/31/2001  T:  11/10/2001  Job:  [id number]   cc:   Thomas A. Patty Sermons, M.D.

## 2010-08-25 NOTE — Cardiovascular Report (Signed)
[place]  Patient:    [name], [name] Visit Number: [id number] MRN: [id number]          Service Type: CAT Location: [facility] 2860 [id number] Attending Physician:  Norman Clay Dictated by:   Darden Palmer., M.D. Proc. Date: 10/02/01 Admit Date:  10/02/2001 Discharge Date: 10/02/2001   CC:         Thomas A. Patty Sermons, M.D.  Particia Lather, M.D.   Cardiac Catheterization  PROCEDURE:  Cardiac catheterization.  HISTORY:  A 77 year old male with a previous history of redo bypass grafting in May 1994.  He has developed worsening angina recently, and catheterization was advised after an abnormal Cardiolite scan.  CARDIOLOGIST:  Darden Palmer., M.D.  COMMENTS ABOUT PROCEDURE:  The patient tolerated the procedure well without complications.  All the grafts were selected using a standard right catheter. Following the procedure, a ventriculogram was performed.  He tolerated the procedure well.  Please see the attached catheterization log for the remainder of the details.  HEMODYNAMIC DATA:  Aorta post contrast 124/49, LV post contrast 129/15.  There did not appear to be a significant gradient on pullback; however, there was a premature ventricular contraction that occurred right at pullback which likely accounts for the one very small gradient that was seen.  ANGIOGRAPHIC DATA:  Left ventriculogram:  Performed in the 30 degree RAO projection.  The aortic valve was normal.  The mitral valve was normal.  The left ventricle appears normal in size.  The estimated ejection fraction was 60%. There was trace mitral regurgitation which is thought to be catheter induced.  The wall motion was normal.  Coronary arteries arise and distribute normal, heavily calcified in the left coronary system as well as the ostium of the right coronary artery.  The left main coronary artery is occluded proximally.  A collateral branch is seen  that fills the proximal right coronary artery coming off the left main.  The right coronary artery is calcified and is occluded proximally.  Sequential supraventricular tachycardia to the obtuse marginal #1 and #2: There is a 40% somewhat eccentric proximal stenosis which appears to be at the site of a previous valve.  The two distal anastomotic sites are patent.  The second marginal branch is occluded after the anastomotic site and fills by collaterals.  Saphenous vein graft to the right coronary artery is patent proximally.  There is mild irregularity in the body of the graft.  There is a severe 90 to 95% stenosis at the bifurcation of a posterolateral branch and a continuation branch.  The posterior descending artery is subtotally occluded with a very faint antegrade flow and fills by collaterals through the circumflex marginal graft as well as the internal mammary graft.  Sequential internal mammary graft to the diagonal LAD:  This internal mammary graft is now widely  patent.  The two distal anastomotic sites are patent. The left anterior descending and diagonal are somewhat small and diffusely diseased.  There is a stenosis proximal within the LAD retrograde to the insertion site of the vein graft prior to two sets of perforators.  Saphenous vein grafts from 1982 are now totally occluded.  IMPRESSION: 1. Severe left main and three-vessel coronary artery disease. 2. Normal left ventricular function. 3. Patent sequential vein grafts to the marginal system, sequential internal    mammary grafts to the left anterior descending artery system. 4. Severe stenosis involving the distal right coronary artery either at the  insertion site or in the distal portion of the graft prior to a small    posterolateral and continuation branch.  RECOMMENDATIONS:  Sites of ischemia exist in the proximal septal perforator system in the LAD, in the distal right coronary artery bed, in the  posterior descending artery, as well as the circumflex second marginal branch.  I think that he may be a candidate for a percutaneous intervention of the bifurcation through the right graft.  The distal vessel is somewhat small, making stenting more problematic. Dictated by:   Darden Palmer., M.D. Attending Physician:  Norman Clay DD:  10/02/01 TD:  10/03/01 Job: [id number]

## 2010-09-12 ENCOUNTER — Telehealth: Payer: Self-pay | Admitting: *Deleted

## 2010-09-12 ENCOUNTER — Encounter: Payer: Self-pay | Admitting: Internal Medicine

## 2010-09-12 NOTE — Telephone Encounter (Signed)
Patient came in with some blood pressure readings 148-164/52-96. Reviewed by Dr Patty Sermons,  will increase his lisinopril to 20 mg twice daily.  Advised patient. Continue to monitor and call back if any problems high or low.

## 2010-09-14 ENCOUNTER — Other Ambulatory Visit: Payer: Self-pay | Admitting: Cardiology

## 2010-09-14 DIAGNOSIS — I10 Essential (primary) hypertension: Secondary | ICD-10-CM

## 2010-09-14 MED ORDER — AMLODIPINE BESYLATE 5 MG PO TABS: 5.0000 mg | ORAL_TABLET | Freq: Every day | ORAL | Status: DC

## 2010-09-14 NOTE — Telephone Encounter (Signed)
Advised patients wife and called to P/G.

## 2010-09-14 NOTE — Telephone Encounter (Signed)
Wants to speak with you regarding his BP readings since his medication change

## 2010-09-14 NOTE — Telephone Encounter (Signed)
Got up and took meds around 6:45 am.  7:00 am blood pressure 182/92 HR 68, 8:00 am blood pressure 151/103, HR 62 and 9:40 am blood pressure 156/88. Did increase lisinopril 20 mg to twice a day.  He is concerned since his blood pressure does not seem to be improving at all.  Please advise

## 2010-09-14 NOTE — Telephone Encounter (Signed)
Continue present medication and add amlodipine 5 mg one daily

## 2010-09-15 MED ORDER — LISINOPRIL 20 MG PO TABS: 20.0000 mg | ORAL_TABLET | Freq: Two times a day (BID) | ORAL | Status: DC

## 2010-09-15 NOTE — Telephone Encounter (Signed)
Agree with plan 

## 2010-10-03 ENCOUNTER — Other Ambulatory Visit: Payer: Self-pay | Admitting: *Deleted

## 2010-10-03 DIAGNOSIS — I10 Essential (primary) hypertension: Secondary | ICD-10-CM

## 2010-10-03 MED ORDER — LISINOPRIL 20 MG PO TABS: 20.0000 mg | ORAL_TABLET | Freq: Two times a day (BID) | ORAL | Status: DC

## 2010-10-03 NOTE — Telephone Encounter (Signed)
Refilled meds per fax request.  

## 2010-10-09 ENCOUNTER — Other Ambulatory Visit: Payer: Self-pay | Admitting: Cardiology

## 2010-10-09 MED ORDER — FUROSEMIDE 40 MG PO TABS: 40.0000 mg | ORAL_TABLET | Freq: Every day | ORAL | Status: DC

## 2010-10-09 NOTE — Telephone Encounter (Signed)
Med refill

## 2010-10-13 ENCOUNTER — Other Ambulatory Visit: Payer: Self-pay | Admitting: *Deleted

## 2010-10-13 ENCOUNTER — Ambulatory Visit (INDEPENDENT_AMBULATORY_CARE_PROVIDER_SITE_OTHER): Payer: Medicare Other | Admitting: Internal Medicine

## 2010-10-13 ENCOUNTER — Encounter: Payer: Self-pay | Admitting: Internal Medicine

## 2010-10-13 DIAGNOSIS — I509 Heart failure, unspecified: Secondary | ICD-10-CM

## 2010-10-13 DIAGNOSIS — Z951 Presence of aortocoronary bypass graft: Secondary | ICD-10-CM

## 2010-10-13 DIAGNOSIS — I428 Other cardiomyopathies: Secondary | ICD-10-CM

## 2010-10-13 DIAGNOSIS — E7800 Pure hypercholesterolemia, unspecified: Secondary | ICD-10-CM

## 2010-10-13 DIAGNOSIS — I119 Hypertensive heart disease without heart failure: Secondary | ICD-10-CM

## 2010-10-13 DIAGNOSIS — Z95810 Presence of automatic (implantable) cardiac defibrillator: Secondary | ICD-10-CM

## 2010-10-13 DIAGNOSIS — I4891 Unspecified atrial fibrillation: Secondary | ICD-10-CM

## 2010-10-13 DIAGNOSIS — I469 Cardiac arrest, cause unspecified: Secondary | ICD-10-CM | POA: Insufficient documentation

## 2010-10-13 LAB — ICD DEVICE OBSERVATION
AL AMPLITUDE: 1.875 mv
AL IMPEDENCE ICD: 532 Ohm
AL THRESHOLD: 0.625 V
BATTERY VOLTAGE: 3.1534 V
CHARGE TIME: 9.078 s
HV IMPEDENCE: 41 Ohm
PACEART VT: 0
RV LEAD AMPLITUDE: 9.75 mv
TOT-0002: 1
TOT-0006: 20110601000000
TZAT-0001ATACH: 1
TZAT-0001FASTVT: 1
TZAT-0002ATACH: NEGATIVE
TZAT-0002ATACH: NEGATIVE
TZAT-0002SLOWVT: NEGATIVE
TZAT-0004FASTVT: 8
TZAT-0005FASTVT: 88 pct
TZAT-0012ATACH: 150 ms
TZAT-0012SLOWVT: 170 ms
TZAT-0018ATACH: NEGATIVE
TZAT-0019ATACH: 6 V
TZAT-0019ATACH: 6 V
TZAT-0020ATACH: 1.5 ms
TZAT-0020ATACH: 1.5 ms
TZAT-0020SLOWVT: 1.5 ms
TZON-0003ATACH: 350 ms
TZON-0003SLOWVT: 360 ms
TZON-0010SLOWVT: 30 ms
TZST-0001ATACH: 4
TZST-0001ATACH: 5
TZST-0001FASTVT: 2
TZST-0001FASTVT: 4
TZST-0001FASTVT: 5
TZST-0001SLOWVT: 3
TZST-0001SLOWVT: 5
TZST-0002ATACH: NEGATIVE
TZST-0002SLOWVT: NEGATIVE
TZST-0002SLOWVT: NEGATIVE
TZST-0002SLOWVT: NEGATIVE
TZST-0003FASTVT: 35 J
TZST-0003FASTVT: 35 J

## 2010-10-13 NOTE — Assessment & Plan Note (Signed)
Blood pressure is normall  Will have him take his amlodipine just once daily until he sees dr Patty Sermons

## 2010-10-13 NOTE — Assessment & Plan Note (Addendum)
Stable no recurrent VT

## 2010-10-13 NOTE — Progress Notes (Signed)
HPI  [name] is a 77 y.o. male Seen in followup for aborted cardiac arrest in the setting of ischemic heart disease prior bypass grafting and redo bypass grafting most recently in 1994 with subsequent PCI and mild aortic stenosis.He also has a history of Atrial fibrillation with Thrombo- embolic risk factors are notable for age vascular disease and hypertensionand LV dysfunction. the CHADS VASC score of 4. Echo in June 2011 demonstrated ejection fraction of 35-40%.   The patient denies chest pain, shortness of breath, nocturnal dyspnea, orthopnea or peripheral edema.  There have been no palpitations, lightheadedness or syncope.    he recently underwent kyphoplasty.  Also on return from [place] his BP was elevated and amlodipine    Past Medical History  Diagnosis Date  . Cardiac arrest     aborted, atrial fibrillation/flutter  . Presence of automatic cardioverter/defibrillator (AICD)     [employer], remote-yes.     Past Surgical History  Procedure Date  . Cabg     in 1982 and '84 with subsequent PTCI  . Appendectomy   . Tonsillectomy   . Adenoidectomy   . Cataract surgery   . Left elbow tendon surgery     Current Outpatient Prescriptions  Medication Sig Dispense Refill  . amLODipine (NORVASC) 5 MG tablet Take 5 mg by mouth 2 (two) times daily.        Marland Kitchen aspirin 81 MG tablet Take 81 mg by mouth daily.        . clopidogrel (PLAVIX) 75 MG tablet Take 75 mg by mouth daily.        . diazepam (VALIUM) 5 MG tablet Take 5 mg by mouth every 6 (six) hours as needed.        . furosemide (LASIX) 40 MG tablet Take 1 tablet (40 mg total) by mouth daily.  90 tablet  3  . HYDROcodone-acetaminophen (NORCO) 5-325 MG per tablet Take 1 tablet by mouth as directed.        . isosorbide mononitrate (IMDUR) 60 MG 24 hr tablet Take 60 mg by mouth daily.        Marland Kitchen lisinopril (PRINIVIL,ZESTRIL) 20 MG tablet Take 1 tablet (20 mg total) by mouth 2 (two) times daily.  180 tablet  3  . metoprolol tartrate  (LOPRESSOR) 25 MG tablet Take 1 tablet (25 mg total) by mouth 2 (two) times daily. Taking 100mg  in the am and 75mg  in the pm  210 tablet  11  . Multiple Vitamin (MULTIVITAMIN) tablet Take 1 tablet by mouth daily.        . niacin (SLO-NIACIN) 500 MG tablet Take 500 mg by mouth at bedtime.        . nitroGLYCERIN (NITROSTAT) 0.4 MG SL tablet Place 0.4 mg under the tongue every 5 (five) minutes as needed.        . potassium chloride (K-DUR,KLOR-CON) 10 MEQ tablet Take 10 mEq by mouth 2 (two) times daily.        . rosuvastatin (CRESTOR) 20 MG tablet Take 20 mg by mouth daily.        . Thiamine HCl (VITAMIN B-1) 100 MG tablet Take 100 mg by mouth daily.        Marland Kitchen DISCONTD: amLODipine (NORVASC) 5 MG tablet Take 1 tablet (5 mg total) by mouth daily.  30 tablet  11  . DISCONTD: Coenzyme Q10 10 MG capsule Take 10 mg by mouth daily.        Marland Kitchen DISCONTD: senna (SENOKOT) 8.6 MG tablet Take  1 tablet by mouth daily.        Marland Kitchen DISCONTD: traMADol (ULTRAM) 50 MG tablet Take 50 mg by mouth every 6 (six) hours as needed.          Allergies  Allergen Reactions  . Cyclobenzaprine Hcl   . Tetracycline     Review of Systems negative except from HPI and PMH  Physical Exam Well developed and well nourished in no acute distress HENT normal E scleral and icterus clear Neck Supple JVP flat; carotids brisk and full Clear to ausculation Regular rate and rhythm, no murmurs gallops or rub Soft with active bowel sounds No clubbing cyanosis and edema Alert and oriented, grossly normal motor and sensory function Skin Warm and Dry   Assessment and  Plan

## 2010-10-13 NOTE — Assessment & Plan Note (Signed)
stble on current meds\ 

## 2010-10-13 NOTE — Assessment & Plan Note (Signed)
No recurrent AFib;  would appropriately be on oral anticoagulation with high CHADS Vas score.  He mihgt need to have antiplatlet therapy adjusted

## 2010-10-13 NOTE — Assessment & Plan Note (Signed)
The patient's device was interrogated.  The information was reviewed. No changes were made in the programming.    

## 2010-10-13 NOTE — Patient Instructions (Signed)
Your physician wants you to follow-up in: 1 year. You will receive a reminder letter in the mail two months in advance. If you don't receive a letter, please call our office to schedule the follow-up appointment.  Your physician has recommended you make the following change in your medication:  1) decrease norvasc to 5mg  one tablet by mouth daily.

## 2010-10-24 ENCOUNTER — Other Ambulatory Visit (INDEPENDENT_AMBULATORY_CARE_PROVIDER_SITE_OTHER): Payer: Medicare Other | Admitting: *Deleted

## 2010-10-24 DIAGNOSIS — E7800 Pure hypercholesterolemia, unspecified: Secondary | ICD-10-CM

## 2010-10-24 DIAGNOSIS — I119 Hypertensive heart disease without heart failure: Secondary | ICD-10-CM

## 2010-10-24 LAB — BASIC METABOLIC PANEL
BUN: 23 mg/dL (ref 6–23)
CO2: 27 mEq/L (ref 19–32)
Chloride: 105 mEq/L (ref 96–112)
Potassium: 4.2 mEq/L (ref 3.5–5.1)

## 2010-10-24 LAB — HEPATIC FUNCTION PANEL
Albumin: 4.8 g/dL (ref 3.5–5.2)
Alkaline Phosphatase: 53 U/L (ref 39–117)
Bilirubin, Direct: 0 mg/dL (ref 0.0–0.3)
Total Protein: 7.3 g/dL (ref 6.0–8.3)

## 2010-10-24 LAB — LIPID PANEL
Cholesterol: 171 mg/dL (ref 0–200)
LDL Cholesterol: 95 mg/dL (ref 0–99)
Total CHOL/HDL Ratio: 3
VLDL: 25.4 mg/dL (ref 0.0–40.0)

## 2010-10-26 ENCOUNTER — Other Ambulatory Visit: Payer: Self-pay | Admitting: *Deleted

## 2010-10-26 ENCOUNTER — Ambulatory Visit (INDEPENDENT_AMBULATORY_CARE_PROVIDER_SITE_OTHER): Payer: Medicare Other | Admitting: Cardiology

## 2010-10-26 ENCOUNTER — Encounter: Payer: Self-pay | Admitting: Cardiology

## 2010-10-26 VITALS — BP 120/70 | HR 70 | Wt 150.4 lb

## 2010-10-26 DIAGNOSIS — I119 Hypertensive heart disease without heart failure: Secondary | ICD-10-CM

## 2010-10-26 DIAGNOSIS — Z951 Presence of aortocoronary bypass graft: Secondary | ICD-10-CM

## 2010-10-26 DIAGNOSIS — E7800 Pure hypercholesterolemia, unspecified: Secondary | ICD-10-CM

## 2010-10-26 NOTE — Assessment & Plan Note (Signed)
The patient had a recent flare of his blood pressure and was placed on amlodipine.  He should have been taking it once a day from the start but somehow was taking it twice a day until he saw Dr. [name].  Dr. [name] advised him to cut back appropriately to just 5 mg once a day.  His blood pressure has remained normal on this lesser dose.

## 2010-10-26 NOTE — Assessment & Plan Note (Signed)
The patient has a history of hypercholesterolemia.  We reviewed his recent lipid studies.  They are improving since he has been able to be more active following his recovery from the neck injury and recovery from the kyphoplasty.  He will continue same medication.

## 2010-10-26 NOTE — Assessment & Plan Note (Signed)
The patient has a history of ischemic heart disease and had coronary artery bypass graft surgery in 1982 and a redo CABG in 1994.  He has had stenting of his right coronary artery distal to the vein graft in 2003 and he had stenting of the body of the vein graft to the right coronary artery in 2005.  He had stenting of the saphenous vein graft to the first and second obtuse marginal vessels and 2007 both proximally and distally.  In October of 2009 he had stenting of the proximal and distal vein graft to the obtuse first marginal vessel his last cardiac catheterization was February 2011 which showed that his native left main and right coronary arteries were still occluded and the vein graft to the obtuse marginal vessel and the vein graft to the right coronary artery was still patent and the LIMA to the LAD was patent but had moderate disease distal to the insertion.  The patient has had a mild recurrence of exertional throat pain with activities such as walking on his treadmill or elliptical.  The discomfort is not severe and goes away when he slows his place and he has not had to take sublingual nitroglycerin.

## 2010-10-26 NOTE — Progress Notes (Signed)
[name] Date of Birth:  1934-02-18 [facility] [address]   [address] [place], [place]  [address] [phone]           Fax   [phone]  History of Present Illness: This pleasant 77 year old gentleman is seen for a scheduled followup office visit.  He has a history of ischemic heart disease.  He has had coronary artery bypass graft surgery in 1982 and again in 1994.  He has had multiple PCI procedures by Dr. Peter Swaziland.  He had an out of hospital cardiac arrest secondary to witnessed ventricular fibrillation while playing tennis on 08/29/09.  He was given CPR and defibrillated and brought to [facility] where he was placed on the [place] sun protocol and made a good functional and mental recovery.  He had a cardiac catheterization by Dr. Swaziland on may 31st 2011 and there were no lesions amenable to catheter-based intervention and the findings were unchanged since February 2011.  He subsequently went to the Cath Lab where Dr. [name] placed at [employer] ICD.  He was in atrial fibrillation or from the hospital but after several months of Coumadin underwent cardioversion in August 2011 and has remained in sinus rhythm since that time.  He remains on aspirin and Plavix but we did stop his Coumadin.  He has a history of hypercholesterolemia and a history of essential hypertension.  He has a history of mild aortic stenosis at the time of cardiac catheterization.  His last echocardiogram in the office was 05/02/09 prior to his cardiac arrest.  Current Outpatient Prescriptions  Medication Sig Dispense Refill  . amLODipine (NORVASC) 5 MG tablet Take 1 tablet (5 mg total) by mouth daily.      Marland Kitchen aspirin 81 MG tablet Take 81 mg by mouth daily.        . clopidogrel (PLAVIX) 75 MG tablet Take 75 mg by mouth daily.        . diazepam (VALIUM) 5 MG tablet Take 5 mg by mouth every 6 (six) hours as needed.        . furosemide (LASIX) 40 MG tablet Take 1 tablet (40 mg total) by  mouth daily.  90 tablet  3  . isosorbide mononitrate (IMDUR) 60 MG 24 hr tablet Take 60 mg by mouth daily.        Marland Kitchen lisinopril (PRINIVIL,ZESTRIL) 20 MG tablet Take 1 tablet (20 mg total) by mouth 2 (two) times daily.  180 tablet  3  . metoprolol tartrate (LOPRESSOR) 25 MG tablet Take 1 tablet (25 mg total) by mouth 2 (two) times daily. Taking 100mg  in the am and 75mg  in the pm  210 tablet  11  . Multiple Vitamin (MULTIVITAMIN) tablet Take 1 tablet by mouth daily.        . niacin (SLO-NIACIN) 500 MG tablet Take 500 mg by mouth at bedtime.        . nitroGLYCERIN (NITROSTAT) 0.4 MG SL tablet Place 0.4 mg under the tongue every 5 (five) minutes as needed.        . potassium chloride (K-DUR,KLOR-CON) 10 MEQ tablet Take 10 mEq by mouth 2 (two) times daily.        . rosuvastatin (CRESTOR) 20 MG tablet Take 20 mg by mouth daily.        . Thiamine HCl (VITAMIN B-1) 100 MG tablet Take 100 mg by mouth daily.        Marland Kitchen HYDROcodone-acetaminophen (NORCO) 5-325 MG per tablet Take 1  tablet by mouth as directed.          Allergies  Allergen Reactions  . Cyclobenzaprine Hcl   . Tetracycline     Patient Active Problem List  Diagnoses  . Atrial fibrillation  . AUTOMATIC IMPLANTABLE CARDIAC DEFIBRILLATOR SITU  . Back pain  . Hypercholesteremia  . Benign hypertensive heart disease without heart failure  . S/P CABG (coronary artery bypass graft)  . Sudden cardiac death-aborted    History  Smoking status  . Former Smoker  . Quit date: 10/25/1952  Smokeless tobacco  . Not on file    History  Alcohol Use No    Family History  Problem Relation Age of Onset  . Coronary artery disease Other     family history is dignificant for early CAD in several members    Review of Systems: Constitutional: no fever chills diaphoresis or fatigue or change in weight.  Head and neck: no hearing loss, no epistaxis, no photophobia or visual disturbance. Respiratory: No cough, shortness of breath or  wheezing. Cardiovascular: No chest pain peripheral edema, palpitations. Gastrointestinal: No abdominal distention, no abdominal pain, no change in bowel habits hematochezia or melena. Genitourinary: No dysuria, no frequency, no urgency, no nocturia. Musculoskeletal:No arthralgias, no back pain, no gait disturbance or myalgias. Neurological: No dizziness, no headaches, no numbness, no seizures, no syncope, no weakness, no tremors. Hematologic: No lymphadenopathy, no easy bruising. Psychiatric: No confusion, no hallucinations, no sleep disturbance.    Physical Exam: Filed Vitals:   10/26/10 1621  BP: 120/70  Pulse: 70  The general appearance reveals a well-developed well-nourished gentleman in no distress.Pupils equal and reactive.   Extraocular Movements are full.  There is no scleral icterus.  The mouth and pharynx are normal.  The neck is supple.  The carotids reveal no bruits.  The jugular venous pressure is normal.  The thyroid is not enlarged.  There is no lymphadenopathy.  The chest is clear to percussion and auscultation. There are no rales or rhonchi. Expansion of the chest is symmetrical.    The heart reveals a grade 2/6 systolic ejection murmur at the aortic area.  No diastolic murmur.  No gallop or rub.  The abdomen is soft and nontender. Bowel sounds are normal. The liver and spleen are not enlarged. There Are no abdominal masses. There are no bruits.  The pedal pulses are good.  There is no phlebitis or edema.  There is no cyanosis or clubbing.  Strength is normal and symmetrical in all extremities.  There is no lateralizing weakness.  There are no sensory deficits.  The skin is warm and dry.  There is no rash.     Assessment / Plan: Continue same medication Including taking amlodipine just 5 mg once a day.  He'll be rechecked in 4 months for a followup office visit lipid panel hepatic function panel and basal metabolic panel.

## 2010-11-08 DIAGNOSIS — 419620001 Death: Secondary | SNOMED CT

## 2010-11-08 DEATH — deceased

## 2010-12-12 ENCOUNTER — Other Ambulatory Visit: Payer: Self-pay | Admitting: Cardiology

## 2010-12-12 MED ORDER — NITROGLYCERIN 0.4 MG SL SUBL: 0.4000 mg | SUBLINGUAL_TABLET | SUBLINGUAL | Status: DC | PRN

## 2010-12-12 NOTE — Telephone Encounter (Signed)
Pt wants refill of his nitroglycerin please call

## 2010-12-28 ENCOUNTER — Other Ambulatory Visit: Payer: Self-pay | Admitting: *Deleted

## 2010-12-28 DIAGNOSIS — F419 Anxiety disorder, unspecified: Secondary | ICD-10-CM

## 2010-12-28 MED ORDER — DIAZEPAM 5 MG PO TABS: 5.0000 mg | ORAL_TABLET | Freq: Every day | ORAL | Status: DC | PRN

## 2010-12-28 NOTE — Telephone Encounter (Signed)
Faxed signed Rx back 

## 2011-01-01 LAB — COMPREHENSIVE METABOLIC PANEL
BUN: 19
CO2: 29
Calcium: 9.7
Chloride: 107
Creatinine, Ser: 0.98
GFR calc non Af Amer: >60
Total Bilirubin: 0.6

## 2011-01-01 LAB — CBC
HCT: 36.9 — ABNORMAL LOW
MCHC: 34.6
MCV: 92
RBC: 4.01 — ABNORMAL LOW
WBC: 5.9

## 2011-01-01 LAB — DIFFERENTIAL
Basophils Absolute: 0
Lymphocytes Relative: 31
Lymphs Abs: 1.8
Neutro Abs: 3.2
Neutrophils Relative %: 55

## 2011-01-01 LAB — PROTIME-INR
INR: 1
Prothrombin Time: 13.4

## 2011-01-08 LAB — POCT I-STAT 3, VENOUS BLOOD GAS (G3P V)
O2 Saturation: 64
pCO2, Ven: 43.8 — ABNORMAL LOW
pH, Ven: 7.354 — ABNORMAL HIGH

## 2011-01-08 LAB — POCT I-STAT 3, ART BLOOD GAS (G3+)
Acid-Base Excess: 1
Bicarbonate: 24.7 — ABNORMAL HIGH
pH, Arterial: 7.435

## 2011-01-08 LAB — BASIC METABOLIC PANEL
BUN: 15
Creatinine, Ser: 0.97
GFR calc Af Amer: >60
GFR calc non Af Amer: >60
Potassium: 3.8

## 2011-01-08 LAB — CBC
HCT: 38.3 — ABNORMAL LOW
Platelets: 120 — ABNORMAL LOW
RBC: 4.1 — ABNORMAL LOW
WBC: 7.3

## 2011-01-11 ENCOUNTER — Ambulatory Visit (INDEPENDENT_AMBULATORY_CARE_PROVIDER_SITE_OTHER): Payer: Medicare Other | Admitting: *Deleted

## 2011-01-11 ENCOUNTER — Other Ambulatory Visit: Payer: Self-pay | Admitting: Internal Medicine

## 2011-01-11 ENCOUNTER — Encounter: Payer: Self-pay | Admitting: Internal Medicine

## 2011-01-11 DIAGNOSIS — I469 Cardiac arrest, cause unspecified: Secondary | ICD-10-CM

## 2011-01-11 DIAGNOSIS — Z95810 Presence of automatic (implantable) cardiac defibrillator: Secondary | ICD-10-CM

## 2011-01-11 DIAGNOSIS — I4891 Unspecified atrial fibrillation: Secondary | ICD-10-CM

## 2011-01-11 LAB — POCT I-STAT, CHEM 8
BUN: 23 mg/dL (ref 6–23)
Calcium, Ion: 1.32 mmol/L (ref 1.12–1.32)
Chloride: 104 mEq/L (ref 96–112)
Creatinine, Ser: 1.2 mg/dL (ref 0.4–1.5)
Glucose, Bld: 95 mg/dL (ref 70–99)
HCT: 40 % (ref 39.0–52.0)
Hemoglobin: 13.6 g/dL (ref 13.0–17.0)
Potassium: 3.9 mEq/L (ref 3.5–5.1)
Sodium: 140 mEq/L (ref 135–145)
TCO2: 28 mmol/L (ref 0–100)

## 2011-01-20 LAB — REMOTE ICD DEVICE
AL THRESHOLD: 0.625 V
BAMS-0001: 170 {beats}/min
BATTERY VOLTAGE: 3.1261 V
DEV-0020ICD: NEGATIVE
FVT: 0
PACEART VT: 0
RV LEAD THRESHOLD: 1.25 V
TZAT-0001ATACH: 3
TZAT-0005FASTVT: 88 pct
TZAT-0011FASTVT: 10 ms
TZAT-0012ATACH: 150 ms
TZAT-0012ATACH: 150 ms
TZAT-0012ATACH: 150 ms
TZAT-0012SLOWVT: 170 ms
TZAT-0018FASTVT: NEGATIVE
TZAT-0018SLOWVT: NEGATIVE
TZAT-0019ATACH: 6 V
TZAT-0019ATACH: 6 V
TZAT-0019SLOWVT: 8 V
TZAT-0020ATACH: 1.5 ms
TZAT-0020ATACH: 1.5 ms
TZAT-0020SLOWVT: 1.5 ms
TZON-0003FASTVT: 250 ms
TZON-0003SLOWVT: 360 ms
TZON-0003VSLOWVT: 400 ms
TZON-0004SLOWVT: 16
TZST-0001ATACH: 4
TZST-0001ATACH: 5
TZST-0001ATACH: 6
TZST-0001FASTVT: 3
TZST-0001FASTVT: 5
TZST-0001SLOWVT: 2
TZST-0001SLOWVT: 6
TZST-0002ATACH: NEGATIVE
TZST-0002SLOWVT: NEGATIVE
TZST-0003FASTVT: 35 J
VENTRICULAR PACING ICD: 0.75 pct
VF: 0

## 2011-01-22 NOTE — Progress Notes (Signed)
icd remote check  

## 2011-02-12 ENCOUNTER — Other Ambulatory Visit (INDEPENDENT_AMBULATORY_CARE_PROVIDER_SITE_OTHER): Payer: Medicare Other | Admitting: *Deleted

## 2011-02-12 DIAGNOSIS — E7800 Pure hypercholesterolemia, unspecified: Secondary | ICD-10-CM

## 2011-02-12 DIAGNOSIS — I119 Hypertensive heart disease without heart failure: Secondary | ICD-10-CM

## 2011-02-12 DIAGNOSIS — Z951 Presence of aortocoronary bypass graft: Secondary | ICD-10-CM

## 2011-02-12 LAB — LIPID PANEL
Total CHOL/HDL Ratio: 3
Triglycerides: 99 mg/dL (ref 0.0–149.0)

## 2011-02-12 LAB — HEPATIC FUNCTION PANEL
AST: 29 U/L (ref 0–37)
Albumin: 4.1 g/dL (ref 3.5–5.2)
Alkaline Phosphatase: 49 U/L (ref 39–117)
Bilirubin, Direct: 0.1 mg/dL (ref 0.0–0.3)
Total Bilirubin: 0.6 mg/dL (ref 0.3–1.2)

## 2011-02-12 LAB — BASIC METABOLIC PANEL
BUN: 20 mg/dL (ref 6–23)
CO2: 26 mEq/L (ref 19–32)
Calcium: 9.7 mg/dL (ref 8.4–10.5)
Chloride: 108 mEq/L (ref 96–112)
Creatinine, Ser: 0.9 mg/dL (ref 0.4–1.5)
Glucose, Bld: 104 mg/dL — ABNORMAL HIGH (ref 70–99)

## 2011-02-13 ENCOUNTER — Encounter: Payer: Self-pay | Admitting: *Deleted

## 2011-02-16 ENCOUNTER — Ambulatory Visit (INDEPENDENT_AMBULATORY_CARE_PROVIDER_SITE_OTHER): Payer: Medicare Other | Admitting: Cardiology

## 2011-02-16 ENCOUNTER — Encounter: Payer: Self-pay | Admitting: Cardiology

## 2011-02-16 VITALS — BP 102/64 | HR 74 | Ht 67.0 in | Wt 151.8 lb

## 2011-02-16 DIAGNOSIS — I509 Heart failure, unspecified: Secondary | ICD-10-CM

## 2011-02-16 DIAGNOSIS — I4891 Unspecified atrial fibrillation: Secondary | ICD-10-CM

## 2011-02-16 DIAGNOSIS — I428 Other cardiomyopathies: Secondary | ICD-10-CM

## 2011-02-16 DIAGNOSIS — Z951 Presence of aortocoronary bypass graft: Secondary | ICD-10-CM

## 2011-02-16 DIAGNOSIS — Z9889 Other specified postprocedural states: Secondary | ICD-10-CM

## 2011-02-16 DIAGNOSIS — E7800 Pure hypercholesterolemia, unspecified: Secondary | ICD-10-CM

## 2011-02-16 DIAGNOSIS — I119 Hypertensive heart disease without heart failure: Secondary | ICD-10-CM

## 2011-02-16 MED ORDER — ISOSORBIDE MONONITRATE ER 60 MG PO TB24: 60.0000 mg | ORAL_TABLET | ORAL | Status: DC

## 2011-02-16 MED ORDER — AMLODIPINE BESYLATE 5 MG PO TABS: 5.0000 mg | ORAL_TABLET | Freq: Every evening | ORAL | Status: DC

## 2011-02-16 NOTE — Patient Instructions (Signed)
New Rx's for Amlodipine and Imdur have been sent to [facility] Your physician recommends that you schedule a follow-up appointment in: 4 months with fasting (lp/hfp/bmet/EKG)

## 2011-02-16 NOTE — Assessment & Plan Note (Signed)
He has mild exertional angina manifested by throat pain, relieved by rest

## 2011-02-16 NOTE — Assessment & Plan Note (Signed)
Patient has not had any recurrence of his atrial fibrillation.

## 2011-02-16 NOTE — Assessment & Plan Note (Signed)
The patient's recent labs.  He has had a good clinical response to rosuvastatin and is not having any side effects from the statin therapy

## 2011-02-16 NOTE — Progress Notes (Signed)
[name] Date of Birth:  1933-09-10 [facility] / [facility] [address]   [address] [place], [place]  [address] [phone]           Fax   [phone]  History of Present Illness: .This pleasant 77 year old gentleman is seen for a scheduled followup office visit.  He has been feeling well since last visit.  He still has occasional throat pain which represents angina pectoris.  He has not been playing in the recent tenderness but has been able to play golf and plans to play today.  He has a history of ischemic heart disease with coronary artery bypass graft surgery in 1982 and again in 1994.  He has had multiple PCI procedures.  He had an out of hospital cardiac arrest while playing tennis on 08/29/09 and responded nicely to the hospital CPR and defibrillation and subsequent Arctic sun protocol.  He has made a good functional and mental recovery period a cardiac catheterization by Dr. [place] on 09/06/2009 and there were no lesions amenable to catheter-based intervention he has a Medtronic ICD he underwent successful cardioversion in August 2011 percent he has remained in sinus rhythm and the is no longer on Coumadin but remains on aspirin and Plavix he has a history of mild aortic stenosis.  Current Outpatient Prescriptions  Medication Sig Dispense Refill  . amLODipine (NORVASC) 5 MG tablet Take 1 tablet (5 mg total) by mouth every evening.  90 tablet  3  . aspirin 81 MG tablet Take 81 mg by mouth daily.        . clopidogrel (PLAVIX) 75 MG tablet Take 75 mg by mouth daily.        . diazepam (VALIUM) 5 MG tablet Take 1 tablet (5 mg total) by mouth daily as needed.  30 tablet  3  . furosemide (LASIX) 40 MG tablet Take 1 tablet (40 mg total) by mouth daily.  90 tablet  3  . HYDROcodone-acetaminophen (NORCO) 5-325 MG per tablet Take 1 tablet by mouth as directed.        . isosorbide mononitrate (IMDUR) 60 MG 24 hr tablet Take 1 tablet (60 mg total) by mouth every morning.  90  tablet  3  . lisinopril (PRINIVIL,ZESTRIL) 20 MG tablet Take 1 tablet (20 mg total) by mouth 2 (two) times daily.  180 tablet  3  . metoprolol tartrate (LOPRESSOR) 25 MG tablet Take 1 tablet (25 mg total) by mouth 2 (two) times daily. Taking 100mg  in the am and 75mg  in the pm  210 tablet  11  . Multiple Vitamin (MULTIVITAMIN) tablet Take 1 tablet by mouth daily.        . niacin (SLO-NIACIN) 500 MG tablet Take 500 mg by mouth at bedtime.        . nitroGLYCERIN (NITROSTAT) 0.4 MG SL tablet Place 1 tablet (0.4 mg total) under the tongue every 5 (five) minutes as needed.  25 tablet  prn  . potassium chloride (K-DUR,KLOR-CON) 10 MEQ tablet Take 10 mEq by mouth 2 (two) times daily.        . rosuvastatin (CRESTOR) 20 MG tablet Take 20 mg by mouth daily.        . Thiamine HCl (VITAMIN B-1) 100 MG tablet Take 100 mg by mouth daily.          Allergies  Allergen Reactions  . Cyclobenzaprine Hcl   . Tetracycline     Patient Active Problem List  Diagnoses  . Atrial fibrillation  .  AUTOMATIC IMPLANTABLE CARDIAC DEFIBRILLATOR SITU  . Back pain  . Hypercholesteremia  . Benign hypertensive heart disease without heart failure  . S/P CABG (coronary artery bypass graft)  . Sudden cardiac death-aborted    History  Smoking status  . Former Smoker  . Quit date: 10/25/1952  Smokeless tobacco  . Not on file    History  Alcohol Use No    Family History  Problem Relation Age of Onset  . Coronary artery disease Other     family history is dignificant for early CAD in several members    Review of Systems: Constitutional: no fever chills diaphoresis or fatigue or change in weight.  Head and neck: no hearing loss, no epistaxis, no photophobia or visual disturbance. Respiratory: No cough, shortness of breath or wheezing. Cardiovascular: No chest pain peripheral edema, palpitations. Gastrointestinal: No abdominal distention, no abdominal pain, no change in bowel habits hematochezia or  melena. Genitourinary: No dysuria, no frequency, no urgency, no nocturia. Musculoskeletal:No arthralgias, no back pain, no gait disturbance or myalgias. Neurological: No dizziness, no headaches, no numbness, no seizures, no syncope, no weakness, no tremors. Hematologic: No lymphadenopathy, no easy bruising. Psychiatric: No confusion, no hallucinations, no sleep disturbance.    Physical Exam: Filed Vitals:   02/16/11 1026  BP: 102/64  Pulse: 74   Gen. appearance reveals a well-developed well-nourished elderly gentleman in no distress.Pupils equal and reactive.   Extraocular Movements are full.  There is no scleral icterus.  The mouth and pharynx are normal.  The neck is supple.  The carotids reveal no bruits.  The jugular venous pressure is normal.  The thyroid is not enlarged.  There is no lymphadenopathy.  The chest is clear to percussion and auscultation. There are no rales or rhonchi. Expansion of the chest is symmetrical.  Heart reveals a soft systolic ejection murmur at the baseThe abdomen is soft and nontender. Bowel sounds are normal. The liver and spleen are not enlarged. There Are no abdominal masses. There are no bruits.  The pedal pulses are good.  There is no phlebitis or edema.  There is no cyanosis or clubbing. Strength is normal and symmetrical in all extremities.  There is no lateralizing weakness.  There are no sensory deficits.  The skin is warm and dry.  There is no rash.   Assessment / Plan: Continue same medication.  Recheck in 4 months for office visit lipid panel hepatic function panel basal metabolic panel and EKG

## 2011-04-03 IMAGING — CR DG CHEST 2V
2 series · 2 of 2 positions shown · non-contrast
Comparison: None.

CLINICAL DATA: Cardiac arrest.

CHEST - 2 VIEW

[w chest pa]
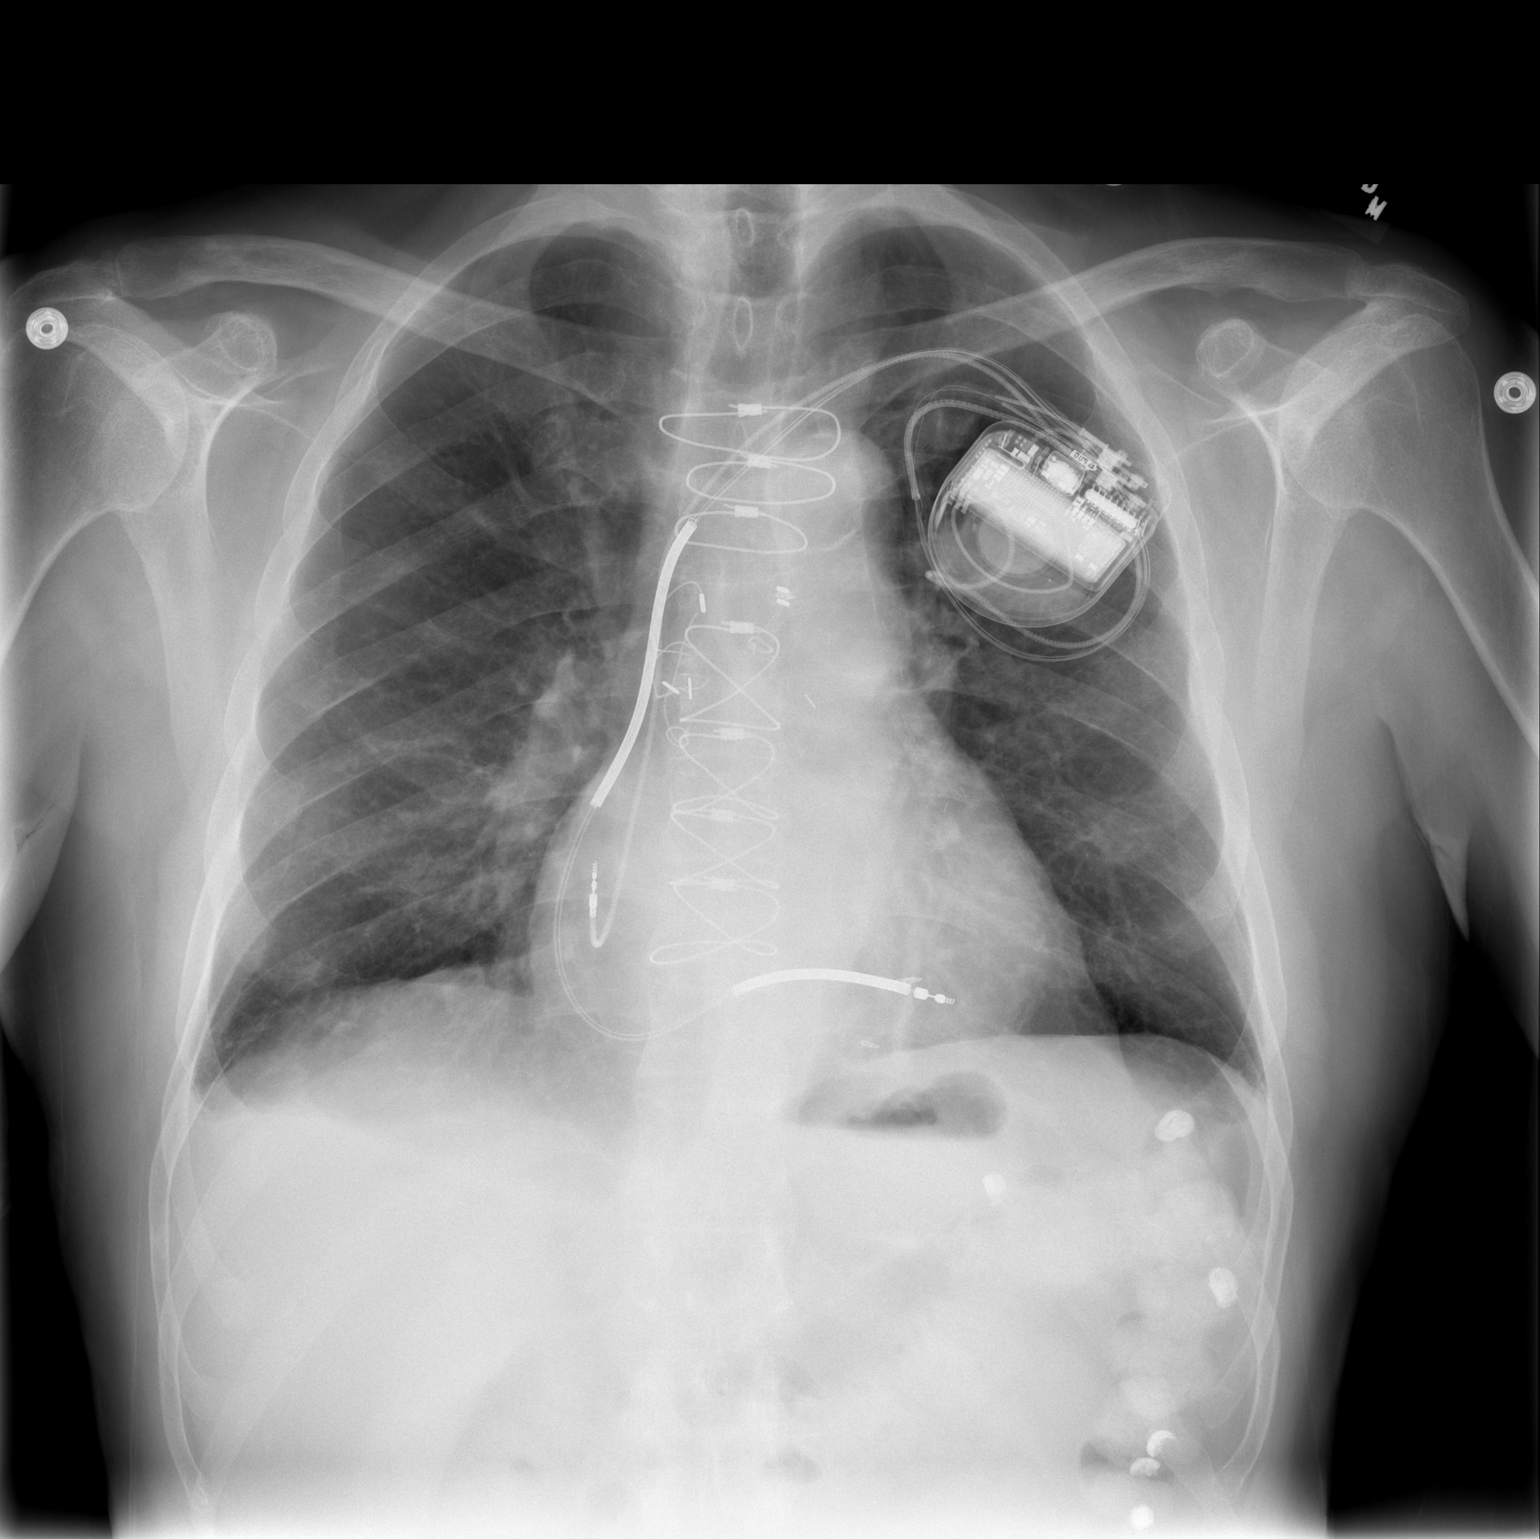

[w chest lat]
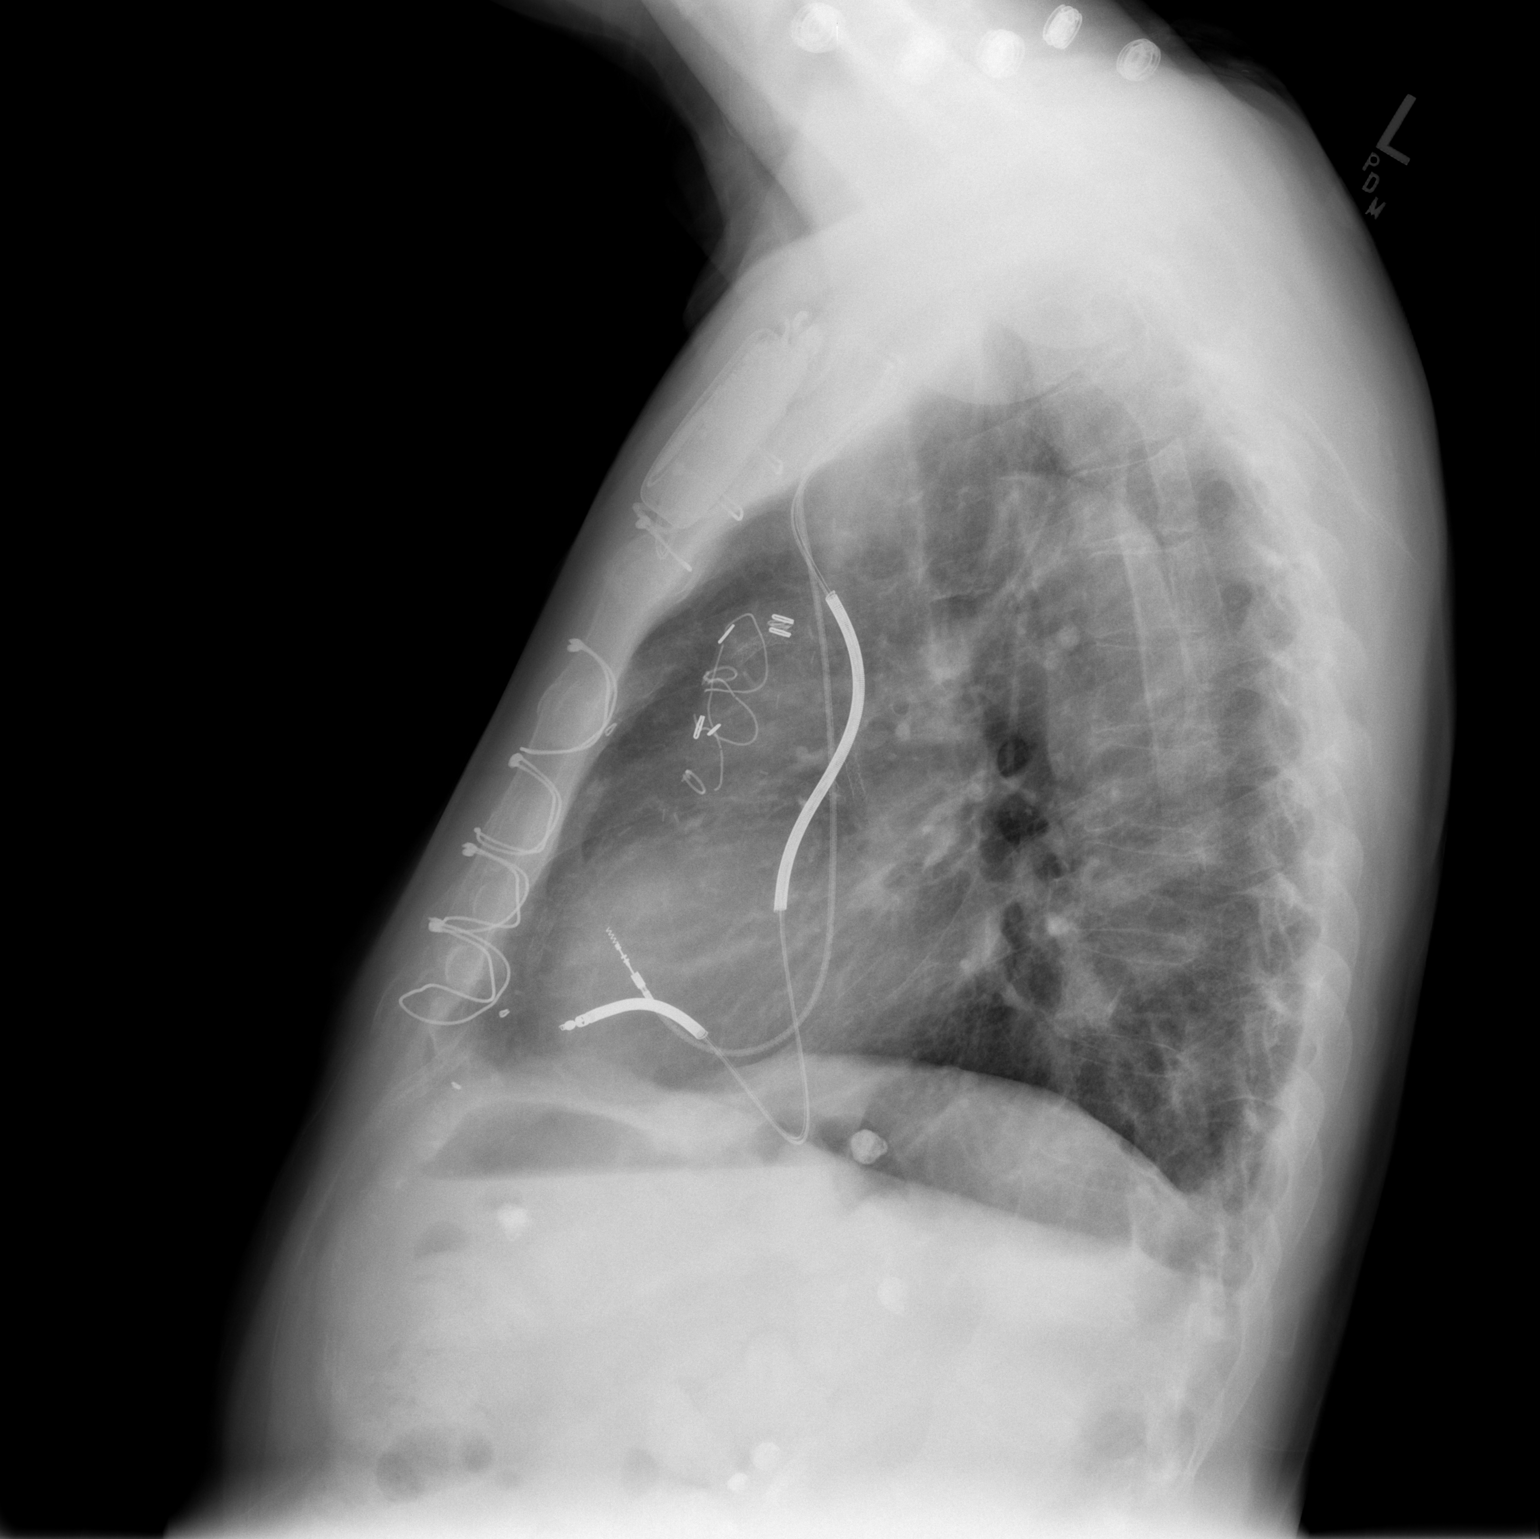

[2 of 2 positions shown; findings below may reference images not displayed]

FINDINGS: Right atrial and AICD leads are noted.  AICD lead is in
the inferior aspect right ventricle.  Normal cardiomediastinal
silhouette.  Minimal right pleural effusion.  Mild pulmonary
vascular congestion.  No frank pulmonary edema.  Bony thorax
intact.
IMPRESSION: Mild pulmonary vascular congestion and minimal right pleural
effusion.

## 2011-04-11 ENCOUNTER — Other Ambulatory Visit: Payer: Self-pay | Admitting: Cardiology

## 2011-04-11 DIAGNOSIS — I428 Other cardiomyopathies: Secondary | ICD-10-CM

## 2011-04-11 DIAGNOSIS — I10 Essential (primary) hypertension: Secondary | ICD-10-CM

## 2011-04-11 DIAGNOSIS — Z951 Presence of aortocoronary bypass graft: Secondary | ICD-10-CM

## 2011-04-11 DIAGNOSIS — I509 Heart failure, unspecified: Secondary | ICD-10-CM

## 2011-04-11 DIAGNOSIS — I119 Hypertensive heart disease without heart failure: Secondary | ICD-10-CM

## 2011-04-11 MED ORDER — ROSUVASTATIN CALCIUM 20 MG PO TABS: 20.0000 mg | ORAL_TABLET | Freq: Every day | ORAL | Status: DC

## 2011-04-11 MED ORDER — CLOPIDOGREL BISULFATE 75 MG PO TABS: 75.0000 mg | ORAL_TABLET | Freq: Every day | ORAL | Status: DC

## 2011-04-11 MED ORDER — AMLODIPINE BESYLATE 5 MG PO TABS: 5.0000 mg | ORAL_TABLET | Freq: Every evening | ORAL | Status: DC

## 2011-04-11 MED ORDER — LISINOPRIL 20 MG PO TABS: 20.0000 mg | ORAL_TABLET | Freq: Two times a day (BID) | ORAL | Status: DC

## 2011-04-11 MED ORDER — ISOSORBIDE MONONITRATE ER 60 MG PO TB24: 60.0000 mg | ORAL_TABLET | ORAL | Status: DC

## 2011-04-11 MED ORDER — METOPROLOL TARTRATE 25 MG PO TABS: 25.0000 mg | ORAL_TABLET | Freq: Two times a day (BID) | ORAL | Status: DC

## 2011-04-11 MED ORDER — FUROSEMIDE 40 MG PO TABS: 40.0000 mg | ORAL_TABLET | Freq: Every day | ORAL | Status: DC

## 2011-04-11 NOTE — Telephone Encounter (Signed)
New Msg: Pt calling stating that she needs refills of medication preferably 90 day supply.

## 2011-04-16 ENCOUNTER — Other Ambulatory Visit: Payer: Self-pay | Admitting: Cardiology

## 2011-04-16 DIAGNOSIS — I10 Essential (primary) hypertension: Secondary | ICD-10-CM

## 2011-04-16 DIAGNOSIS — F419 Anxiety disorder, unspecified: Secondary | ICD-10-CM

## 2011-04-16 MED ORDER — DIAZEPAM 5 MG PO TABS: 5.0000 mg | ORAL_TABLET | Freq: Every day | ORAL | Status: DC | PRN

## 2011-04-16 MED ORDER — LISINOPRIL 20 MG PO TABS: 20.0000 mg | ORAL_TABLET | Freq: Two times a day (BID) | ORAL | Status: DC

## 2011-04-16 NOTE — Telephone Encounter (Signed)
New msg Pt said he didn't get lisinipril at the [facility] and wants refill diazepam all must be 90 days supply

## 2011-04-16 NOTE — Telephone Encounter (Signed)
New msg] Pt is going on trip overseas and wants to talk to you about device he has please call him back

## 2011-04-16 NOTE — Telephone Encounter (Signed)
Is it ok for him to go on a river cruise starting in [place] and ending in [place]?

## 2011-04-19 ENCOUNTER — Other Ambulatory Visit: Payer: Self-pay | Admitting: Internal Medicine

## 2011-04-19 ENCOUNTER — Encounter: Payer: Self-pay | Admitting: Internal Medicine

## 2011-04-19 ENCOUNTER — Ambulatory Visit (INDEPENDENT_AMBULATORY_CARE_PROVIDER_SITE_OTHER): Payer: Medicare Other | Admitting: *Deleted

## 2011-04-19 DIAGNOSIS — I469 Cardiac arrest, cause unspecified: Secondary | ICD-10-CM | POA: Diagnosis not present

## 2011-04-19 DIAGNOSIS — I119 Hypertensive heart disease without heart failure: Secondary | ICD-10-CM

## 2011-04-22 LAB — REMOTE ICD DEVICE
AL AMPLITUDE: 1.75 mv
AL IMPEDENCE ICD: 532 Ohm
ATRIAL PACING ICD: 77.5 pct
CHARGE TIME: 9.429 s
RV LEAD IMPEDENCE ICD: 323 Ohm
RV LEAD THRESHOLD: 1.25 V
TOT-0001: 1
TOT-0006: 20110601000000
TZAT-0001ATACH: 1
TZAT-0002ATACH: NEGATIVE
TZAT-0012SLOWVT: 170 ms
TZAT-0013FASTVT: 2
TZAT-0018ATACH: NEGATIVE
TZAT-0018ATACH: NEGATIVE
TZAT-0019ATACH: 6 V
TZAT-0019ATACH: 6 V
TZAT-0019SLOWVT: 8 V
TZAT-0020ATACH: 1.5 ms
TZAT-0020ATACH: 1.5 ms
TZAT-0020SLOWVT: 1.5 ms
TZON-0003FASTVT: 250 ms
TZON-0003SLOWVT: 360 ms
TZON-0010FASTVT: 30 ms
TZST-0001ATACH: 5
TZST-0001FASTVT: 2
TZST-0001FASTVT: 5
TZST-0001SLOWVT: 5
TZST-0002ATACH: NEGATIVE
TZST-0002ATACH: NEGATIVE
TZST-0002SLOWVT: NEGATIVE
TZST-0002SLOWVT: NEGATIVE
TZST-0002SLOWVT: NEGATIVE
TZST-0003FASTVT: 30 J
TZST-0003FASTVT: 35 J
TZST-0003FASTVT: 35 J
VENTRICULAR PACING ICD: 0.52 pct

## 2011-04-25 NOTE — Progress Notes (Signed)
ICD remote with ICM 

## 2011-05-01 ENCOUNTER — Encounter: Payer: Self-pay | Admitting: *Deleted

## 2011-05-10 DIAGNOSIS — H18519 Endothelial corneal dystrophy, unspecified eye: Secondary | ICD-10-CM | POA: Diagnosis not present

## 2011-05-10 DIAGNOSIS — H04129 Dry eye syndrome of unspecified lacrimal gland: Secondary | ICD-10-CM | POA: Diagnosis not present

## 2011-05-10 DIAGNOSIS — H18599 Other hereditary corneal dystrophies, unspecified eye: Secondary | ICD-10-CM | POA: Diagnosis not present

## 2011-05-10 DIAGNOSIS — H401290 Low-tension glaucoma, unspecified eye, stage unspecified: Secondary | ICD-10-CM | POA: Diagnosis not present

## 2011-05-15 DIAGNOSIS — H409 Unspecified glaucoma: Secondary | ICD-10-CM | POA: Diagnosis not present

## 2011-05-15 DIAGNOSIS — H3589 Other specified retinal disorders: Secondary | ICD-10-CM | POA: Diagnosis not present

## 2011-05-15 DIAGNOSIS — Z961 Presence of intraocular lens: Secondary | ICD-10-CM | POA: Diagnosis not present

## 2011-05-15 DIAGNOSIS — H04129 Dry eye syndrome of unspecified lacrimal gland: Secondary | ICD-10-CM | POA: Diagnosis not present

## 2011-06-05 ENCOUNTER — Other Ambulatory Visit (INDEPENDENT_AMBULATORY_CARE_PROVIDER_SITE_OTHER): Payer: Medicare Other

## 2011-06-05 DIAGNOSIS — E7800 Pure hypercholesterolemia, unspecified: Secondary | ICD-10-CM | POA: Diagnosis not present

## 2011-06-05 LAB — LIPID PANEL
Cholesterol: 157 mg/dL (ref 0–200)
HDL: 53 mg/dL
LDL Cholesterol: 84 mg/dL (ref 0–99)
Total CHOL/HDL Ratio: 3
Triglycerides: 101 mg/dL (ref 0.0–149.0)
VLDL: 20.2 mg/dL (ref 0.0–40.0)

## 2011-06-05 LAB — HEPATIC FUNCTION PANEL
ALT: 19 U/L (ref 0–53)
AST: 21 U/L (ref 0–37)
Albumin: 4.5 g/dL (ref 3.5–5.2)
Alkaline Phosphatase: 47 U/L (ref 39–117)
Bilirubin, Direct: 0.1 mg/dL (ref 0.0–0.3)
Total Bilirubin: 0.8 mg/dL (ref 0.3–1.2)
Total Protein: 7.2 g/dL (ref 6.0–8.3)

## 2011-06-05 LAB — BASIC METABOLIC PANEL
BUN: 25 mg/dL — ABNORMAL HIGH (ref 6–23)
CO2: 27 mEq/L (ref 19–32)
Calcium: 10.3 mg/dL (ref 8.4–10.5)
Creatinine, Ser: 0.9 mg/dL (ref 0.4–1.5)
GFR: 84.12 mL/min (ref >60.00)
Glucose, Bld: 105 mg/dL — ABNORMAL HIGH (ref 70–99)
Sodium: 143 mEq/L (ref 135–145)

## 2011-06-11 ENCOUNTER — Ambulatory Visit (INDEPENDENT_AMBULATORY_CARE_PROVIDER_SITE_OTHER): Payer: Medicare Other | Admitting: Cardiology

## 2011-06-11 ENCOUNTER — Encounter: Payer: Self-pay | Admitting: Cardiology

## 2011-06-11 VITALS — BP 118/70 | HR 62 | Ht 66.0 in | Wt 150.0 lb

## 2011-06-11 DIAGNOSIS — I359 Nonrheumatic aortic valve disorder, unspecified: Secondary | ICD-10-CM | POA: Diagnosis not present

## 2011-06-11 DIAGNOSIS — E7800 Pure hypercholesterolemia, unspecified: Secondary | ICD-10-CM | POA: Diagnosis not present

## 2011-06-11 DIAGNOSIS — I4891 Unspecified atrial fibrillation: Secondary | ICD-10-CM

## 2011-06-11 DIAGNOSIS — Z95810 Presence of automatic (implantable) cardiac defibrillator: Secondary | ICD-10-CM

## 2011-06-11 DIAGNOSIS — I350 Nonrheumatic aortic (valve) stenosis: Secondary | ICD-10-CM

## 2011-06-11 DIAGNOSIS — Z951 Presence of aortocoronary bypass graft: Secondary | ICD-10-CM

## 2011-06-11 DIAGNOSIS — I2510 Atherosclerotic heart disease of native coronary artery without angina pectoris: Secondary | ICD-10-CM | POA: Diagnosis not present

## 2011-06-11 MED ORDER — CLOPIDOGREL BISULFATE 75 MG PO TABS: 75.0000 mg | ORAL_TABLET | Freq: Every day | ORAL | Status: DC

## 2011-06-11 NOTE — Assessment & Plan Note (Addendum)
The patient does have occasional tightness in his throat relieved by rest.  He has not had to take any nitroglycerin sublingually.  He plays golf but no tennis..  When he plays golf he rides a cart

## 2011-06-11 NOTE — Patient Instructions (Signed)
Your physician recommends that you continue on your current medications as directed. Please refer to the Current Medication list given to you today. Your physician wants you to follow-up in: 4 month You will receive a reminder letter in the mail two months in advance. If you don't receive a letter, please call our office to schedule the follow-up appointment.  

## 2011-06-11 NOTE — Assessment & Plan Note (Signed)
He has had no known recurrence of atrial fibrillation.  He is no longer on Coumadin.  His electrocardiogram today shows atrial paced rhythm.

## 2011-06-11 NOTE — Progress Notes (Signed)
[name] Date of Birth:  Feb 17, 1934 [facility] [address] [place], [place]  [address] [phone]         Fax   [phone]  History of Present Illness: This pleasant 78 year old gentleman is seen for a four-month followup office visit.  He has a complex past medical history.  He has known ischemic heart disease. He had coronary artery bypass graft surgery in 1982 and a redo coronary artery bypass graft surgery in 1994.  He has had multiple PCI procedures.  He had an out of hospital cardiac arrest while playing tennis on 08/28/09 and was resuscitated and has made a good functional recovery.  His last cardiac catheterization on 09/06/09 showed no lesions amenable to catheter-based intervention.  Does have a Medtronic ICD.  He has remained in normal sinus rhythm since successful cardioversion of atrial fibrillation.  He is no longer on Coumadin but remains on aspirin and Plavix.  He also has a history of mild aortic stenosis.  Current Outpatient Prescriptions  Medication Sig Dispense Refill  . amLODipine (NORVASC) 5 MG tablet Take 1 tablet (5 mg total) by mouth every evening.  90 tablet  3  . aspirin 81 MG tablet Take 81 mg by mouth daily.        . clopidogrel (PLAVIX) 75 MG tablet Take 1 tablet (75 mg total) by mouth daily.  90 tablet  4  . diazepam (VALIUM) 5 MG tablet Take 1 tablet (5 mg total) by mouth daily as needed.  90 tablet  1  . furosemide (LASIX) 40 MG tablet Take 1 tablet (40 mg total) by mouth daily.  90 tablet  4  . isosorbide mononitrate (IMDUR) 60 MG 24 hr tablet Take 1 tablet (60 mg total) by mouth every morning.  90 tablet  4  . lisinopril (PRINIVIL,ZESTRIL) 20 MG tablet Take 1 tablet (20 mg total) by mouth 2 (two) times daily.  180 tablet  3  . metoprolol tartrate (LOPRESSOR) 25 MG tablet Take 1 tablet (25 mg total) by mouth 2 (two) times daily. Taking 100mg  in the am and 75mg  in the pm  210 tablet  11  . Multiple Vitamin (MULTIVITAMIN) tablet  Take 1 tablet by mouth daily.        . niacin (SLO-NIACIN) 500 MG tablet Take 500 mg by mouth at bedtime.        . nitroGLYCERIN (NITROSTAT) 0.4 MG SL tablet Place 1 tablet (0.4 mg total) under the tongue every 5 (five) minutes as needed.  25 tablet  prn  . potassium chloride (K-DUR,KLOR-CON) 10 MEQ tablet Take 10 mEq by mouth 2 (two) times daily.        . rosuvastatin (CRESTOR) 20 MG tablet Take 1 tablet (20 mg total) by mouth daily.  90 tablet  4  . Thiamine HCl (VITAMIN B-1) 100 MG tablet Take 100 mg by mouth daily.        Marland Kitchen latanoprost (XALATAN) 0.005 % ophthalmic solution as directed.        Allergies  Allergen Reactions  . Cyclobenzaprine Hcl   . Tetracycline     Patient Active Problem List  Diagnoses  . Atrial fibrillation  . AUTOMATIC IMPLANTABLE CARDIAC DEFIBRILLATOR SITU  . Back pain  . Hypercholesteremia  . Benign hypertensive heart disease without heart failure  . S/P CABG (coronary artery bypass graft)  . Sudden cardiac death-aborted  . Aortic stenosis, mild    History  Smoking status  . Former Smoker  . Quit  date: 10/25/1952  Smokeless tobacco  . Not on file    History  Alcohol Use No    Family History  Problem Relation Age of Onset  . Coronary artery disease Other     family history is dignificant for early CAD in several members    Review of Systems: Constitutional: no fever chills diaphoresis or fatigue or change in weight.  Head and neck: no hearing loss, no epistaxis, no photophobia or visual disturbance. Respiratory: No cough, shortness of breath or wheezing. Cardiovascular: No chest pain peripheral edema, palpitations. Gastrointestinal: No abdominal distention, no abdominal pain, no change in bowel habits hematochezia or melena. Genitourinary: No dysuria, no frequency, no urgency, no nocturia. Musculoskeletal:No arthralgias, no back pain, no gait disturbance or myalgias. Neurological: No dizziness, no headaches, no numbness, no seizures, no  syncope, no weakness, no tremors. Hematologic: No lymphadenopathy, no easy bruising. Psychiatric: No confusion, no hallucinations, no sleep disturbance.    Physical Exam: Filed Vitals:   06/11/11 1434  BP: 118/70  Pulse: 62   the general appearance reveals a well-developed well-nourished gentleman in no distress.Pupils equal and reactive.   Extraocular Movements are full.  There is no scleral icterus.  The mouth and pharynx are normal.  The neck is supple.  The carotids reveal no bruits.  The jugular venous pressure is normal.  The thyroid is not enlarged.  There is no lymphadenopathy.  The chest is clear to percussion and auscultation. There are no rales or rhonchi. Expansion of the chest is symmetrical.  Heart reveals a grade 2/6 harsh systolic ejection murmur at the base.The abdomen is soft and nontender. Bowel sounds are normal. The liver and spleen are not enlarged. There Are no abdominal masses. There are no bruits.  The pedal pulses are good.  There is no phlebitis or edema.  There is no cyanosis or clubbing. Strength is normal and symmetrical in all extremities.  There is no lateralizing weakness.  There are no sensory deficits.  EKG shows paced atrial rhythm with right bundle branch block   Assessment / Plan: Continue same medication.  Recheck in 4 months for followup office visit and fasting lab work

## 2011-06-11 NOTE — Assessment & Plan Note (Signed)
The patient has had no shocks from his ICD.  He denies any dizziness.  He has not been aware of any palpitations.

## 2011-06-15 ENCOUNTER — Other Ambulatory Visit: Payer: Medicare Other | Admitting: *Deleted

## 2011-06-18 ENCOUNTER — Ambulatory Visit: Payer: Medicare Other | Admitting: Cardiology

## 2011-07-19 ENCOUNTER — Ambulatory Visit (INDEPENDENT_AMBULATORY_CARE_PROVIDER_SITE_OTHER): Payer: Medicare Other | Admitting: *Deleted

## 2011-07-19 ENCOUNTER — Encounter: Payer: Self-pay | Admitting: Internal Medicine

## 2011-07-19 DIAGNOSIS — I509 Heart failure, unspecified: Secondary | ICD-10-CM

## 2011-07-19 DIAGNOSIS — I469 Cardiac arrest, cause unspecified: Secondary | ICD-10-CM | POA: Diagnosis not present

## 2011-07-23 LAB — REMOTE ICD DEVICE
AL IMPEDENCE ICD: 532 Ohm
AL THRESHOLD: 0.625 V
ATRIAL PACING ICD: 86.2 pct
BATTERY VOLTAGE: 3.1134 V
CHARGE TIME: 9.679 s
DEV-0020ICD: NEGATIVE
PACEART VT: 0
RV LEAD THRESHOLD: 1.125 V
TOT-0002: 1
TOT-0006: 20110601000000
TZAT-0001ATACH: 3
TZAT-0001FASTVT: 1
TZAT-0005FASTVT: 88 pct
TZAT-0011FASTVT: 10 ms
TZAT-0012ATACH: 150 ms
TZAT-0012ATACH: 150 ms
TZAT-0012SLOWVT: 170 ms
TZAT-0013FASTVT: 2
TZAT-0018FASTVT: NEGATIVE
TZAT-0019ATACH: 6 V
TZAT-0019FASTVT: 8 V
TZAT-0020ATACH: 1.5 ms
TZAT-0020ATACH: 1.5 ms
TZAT-0020SLOWVT: 1.5 ms
TZON-0003FASTVT: 250 ms
TZON-0003SLOWVT: 360 ms
TZON-0003VSLOWVT: 400 ms
TZON-0004SLOWVT: 16
TZON-0004VSLOWVT: 40
TZON-0005SLOWVT: 12
TZON-0010SLOWVT: 30 ms
TZST-0001ATACH: 6
TZST-0001FASTVT: 3
TZST-0001FASTVT: 5
TZST-0001FASTVT: 6
TZST-0001SLOWVT: 2
TZST-0001SLOWVT: 3
TZST-0002ATACH: NEGATIVE
TZST-0002SLOWVT: NEGATIVE
TZST-0002SLOWVT: NEGATIVE
TZST-0002SLOWVT: NEGATIVE
TZST-0003FASTVT: 30 J
TZST-0003FASTVT: 35 J
VENTRICULAR PACING ICD: 0.7 pct

## 2011-07-26 NOTE — Progress Notes (Signed)
Remote icd check w/icm  

## 2011-08-07 ENCOUNTER — Encounter: Payer: Self-pay | Admitting: *Deleted

## 2011-08-08 DIAGNOSIS — H43819 Vitreous degeneration, unspecified eye: Secondary | ICD-10-CM | POA: Diagnosis not present

## 2011-08-08 DIAGNOSIS — Z961 Presence of intraocular lens: Secondary | ICD-10-CM | POA: Diagnosis not present

## 2011-08-08 DIAGNOSIS — H409 Unspecified glaucoma: Secondary | ICD-10-CM | POA: Diagnosis not present

## 2011-08-08 DIAGNOSIS — H401290 Low-tension glaucoma, unspecified eye, stage unspecified: Secondary | ICD-10-CM | POA: Diagnosis not present

## 2011-08-08 DIAGNOSIS — H18599 Other hereditary corneal dystrophies, unspecified eye: Secondary | ICD-10-CM | POA: Diagnosis not present

## 2011-09-12 DIAGNOSIS — D485 Neoplasm of uncertain behavior of skin: Secondary | ICD-10-CM | POA: Diagnosis not present

## 2011-09-12 DIAGNOSIS — L578 Other skin changes due to chronic exposure to nonionizing radiation: Secondary | ICD-10-CM | POA: Diagnosis not present

## 2011-09-12 DIAGNOSIS — L821 Other seborrheic keratosis: Secondary | ICD-10-CM | POA: Diagnosis not present

## 2011-09-12 DIAGNOSIS — L570 Actinic keratosis: Secondary | ICD-10-CM | POA: Diagnosis not present

## 2011-10-18 ENCOUNTER — Other Ambulatory Visit (INDEPENDENT_AMBULATORY_CARE_PROVIDER_SITE_OTHER): Payer: Medicare Other

## 2011-10-18 DIAGNOSIS — E7800 Pure hypercholesterolemia, unspecified: Secondary | ICD-10-CM

## 2011-10-18 LAB — HEPATIC FUNCTION PANEL
AST: 21 U/L (ref 0–37)
Alkaline Phosphatase: 45 U/L (ref 39–117)
Bilirubin, Direct: 0.1 mg/dL (ref 0.0–0.3)
Total Bilirubin: 0.8 mg/dL (ref 0.3–1.2)

## 2011-10-18 LAB — BASIC METABOLIC PANEL
GFR: 75.54 mL/min (ref >60.00)
Potassium: 4.4 mEq/L (ref 3.5–5.1)
Sodium: 140 mEq/L (ref 135–145)

## 2011-10-18 LAB — LIPID PANEL
LDL Cholesterol: 114 mg/dL — ABNORMAL HIGH (ref 0–99)
VLDL: 17.2 mg/dL (ref 0.0–40.0)

## 2011-10-18 NOTE — Progress Notes (Signed)
Quick Note:  Please make copy of labs for patient visit. ______ 

## 2011-10-19 ENCOUNTER — Ambulatory Visit (INDEPENDENT_AMBULATORY_CARE_PROVIDER_SITE_OTHER): Payer: Medicare Other | Admitting: Cardiology

## 2011-10-19 ENCOUNTER — Encounter: Payer: Self-pay | Admitting: Cardiology

## 2011-10-19 VITALS — BP 110/70 | HR 78 | Ht 66.0 in | Wt 152.0 lb

## 2011-10-19 DIAGNOSIS — I2510 Atherosclerotic heart disease of native coronary artery without angina pectoris: Secondary | ICD-10-CM | POA: Diagnosis not present

## 2011-10-19 DIAGNOSIS — Z95810 Presence of automatic (implantable) cardiac defibrillator: Secondary | ICD-10-CM | POA: Diagnosis not present

## 2011-10-19 DIAGNOSIS — Z951 Presence of aortocoronary bypass graft: Secondary | ICD-10-CM

## 2011-10-19 DIAGNOSIS — E7800 Pure hypercholesterolemia, unspecified: Secondary | ICD-10-CM | POA: Diagnosis not present

## 2011-10-19 NOTE — Assessment & Plan Note (Signed)
Reviewed his lipids from yesterday.  His LDL and total cholesterol are not as good reflecting his recent 2 week cruise vacation.  He will get back on his careful diet and exercise regimen now that he is back home.

## 2011-10-19 NOTE — Assessment & Plan Note (Signed)
The patient has predictable exertional angina.  He mows his grass but does it in sections and rests in between.  He no longer plays tennis but still plays golf.  His angina is relieved by rest and he rarely has to take any sublingual nitroglycerin.  We'll continue current therapy and if his angina worsens we could consider adding ranexa.

## 2011-10-19 NOTE — Patient Instructions (Signed)
Your physician recommends that you continue on your current medications as directed. Please refer to the Current Medication list given to you today. Your physician recommends that you schedule a follow-up appointment in: 4 months with fasting labs (lp/bmet/hfp) and EKG  

## 2011-10-19 NOTE — Assessment & Plan Note (Signed)
Patient has had no shocks from his defibrillator that he is aware of.

## 2011-10-19 NOTE — Progress Notes (Signed)
[name] Date of Birth:  [birth year] [facility] [address] [place], [place]  [address] [phone]         Fax   [phone]  History of Present Illness: This pleasant 78 year old gentleman is seen for a scheduled four-month followup office visit.  He has a past history of ischemic heart disease.  He had coronary artery bypass graft surgery in 1982.  He had a redo coronary artery bypass graft surgery in 1994.  He has had multiple PCI procedures.  He had an out of hospital cardiac arrest while playing tennis on 08/28/09 he was resuscitated him a good functional recovery.  His last cardiac catheterization on 09/06/09 showed no lesions amenable to catheter-based intervention.  He does have a Medtronic ICD.  He has been maintaining normal sinus rhythm since successful cardioversion previous atrial fib.  He has a history of mild aortic stenosis.  Since we last saw him he just returned from a two-week vacation to [place].  He gained 7 pounds on a strip but has lost most of the intentionally since returning.  Current Outpatient Prescriptions  Medication Sig Dispense Refill  . amLODipine (NORVASC) 5 MG tablet Take 1 tablet (5 mg total) by mouth every evening.  90 tablet  3  . aspirin 81 MG tablet Take 81 mg by mouth daily.        . clopidogrel (PLAVIX) 75 MG tablet Take 1 tablet (75 mg total) by mouth daily.  90 tablet  4  . Coenzyme Q10 (CO Q 10 PO) Take by mouth daily.      . diazepam (VALIUM) 5 MG tablet Take 1 tablet (5 mg total) by mouth daily as needed.  90 tablet  1  . fish oil-omega-3 fatty acids 1000 MG capsule Take 1 g by mouth daily.      . furosemide (LASIX) 40 MG tablet Take 1 tablet (40 mg total) by mouth daily.  90 tablet  4  . isosorbide mononitrate (IMDUR) 60 MG 24 hr tablet Take 1 tablet (60 mg total) by mouth every morning.  90 tablet  4  . latanoprost (XALATAN) 0.005 % ophthalmic solution as directed.      Marland Kitchen lisinopril (PRINIVIL,ZESTRIL) 20 MG tablet  Take 1 tablet (20 mg total) by mouth 2 (two) times daily.  180 tablet  3  . metoprolol tartrate (LOPRESSOR) 25 MG tablet Take 1 tablet (25 mg total) by mouth 2 (two) times daily. Taking 100mg  in the am and 75mg  in the pm  210 tablet  11  . Multiple Vitamin (MULTIVITAMIN) tablet Take 1 tablet by mouth daily.        . niacin (SLO-NIACIN) 500 MG tablet Take 500 mg by mouth at bedtime.        . nitroGLYCERIN (NITROSTAT) 0.4 MG SL tablet Place 1 tablet (0.4 mg total) under the tongue every 5 (five) minutes as needed.  25 tablet  prn  . potassium chloride (K-DUR,KLOR-CON) 10 MEQ tablet Take 10 mEq by mouth 2 (two) times daily.        . rosuvastatin (CRESTOR) 20 MG tablet Take 1 tablet (20 mg total) by mouth daily.  90 tablet  4  . Thiamine HCl (VITAMIN B-1) 100 MG tablet Take 100 mg by mouth daily.          Allergies  Allergen Reactions  . Cyclobenzaprine Hcl   . Tetracycline     Patient Active Problem List  Diagnosis  . Atrial fibrillation  . AUTOMATIC IMPLANTABLE  CARDIAC DEFIBRILLATOR SITU  . Back pain  . Hypercholesteremia  . Benign hypertensive heart disease without heart failure  . S/P CABG (coronary artery bypass graft)  . Sudden cardiac death-aborted  . Aortic stenosis, mild    History  Smoking status  . Former Smoker  . Quit date: 10/25/1952  Smokeless tobacco  . Not on file    History  Alcohol Use No    Family History  Problem Relation Age of Onset  . Coronary artery disease Other     family history is dignificant for early CAD in several members    Review of Systems: Constitutional: no fever chills diaphoresis or fatigue or change in weight.  Head and neck: no hearing loss, no epistaxis, no photophobia or visual disturbance. Respiratory: No cough, shortness of breath or wheezing. Cardiovascular: No chest pain peripheral edema, palpitations. Gastrointestinal: No abdominal distention, no abdominal pain, no change in bowel habits hematochezia or  melena. Genitourinary: No dysuria, no frequency, no urgency, no nocturia. Musculoskeletal:No arthralgias, no back pain, no gait disturbance or myalgias. Neurological: No dizziness, no headaches, no numbness, no seizures, no syncope, no weakness, no tremors. Hematologic: No lymphadenopathy, no easy bruising. Psychiatric: No confusion, no hallucinations, no sleep disturbance.    Physical Exam: Filed Vitals:   10/19/11 1611  BP: 110/70  Pulse: 78   the general appearance reveals a well-developed well-nourished gentleman in no distress.The head and neck exam reveals pupils equal and reactive.  Extraocular movements are full.  There is no scleral icterus.  The mouth and pharynx are normal.  The neck is supple.  The carotids reveal no bruits.  The jugular venous pressure is normal.  The  thyroid is not enlarged.  There is no lymphadenopathy.  The chest is clear to percussion and auscultation.  There are no rales or rhonchi.  Expansion of the chest is symmetrical.  The precordium is quiet.  The first heart sound is normal.  The second heart sound is physiologically split.  There is grade 2/6 systolic ejection murmur at the base consistent with aortic stenosis.  There is no abnormal lift or heave.  The abdomen is soft and nontender.  The bowel sounds are normal.  The liver and spleen are not enlarged.  There are no abdominal masses.  There are no abdominal bruits.  Extremities reveal good pedal pulses.  There is no phlebitis or edema.  There is no cyanosis or clubbing.  Strength is normal and symmetrical in all extremities.  There is no lateralizing weakness.  There are no sensory deficits.  The skin is warm and dry.  There is no rash.     Assessment / Plan:  Continue same medication.  Recheck in 4 months for followup office visit EKG and fasting lab work.  After that consider followup echocardiogram to look at his aortic stenosis.  No indication at this point to do another nuclear stress test since his  most recent catheterizations have not shown any lesions amenable to catheter-based intervention and his symptoms are relatively stable.

## 2011-10-27 ENCOUNTER — Other Ambulatory Visit: Payer: Self-pay | Admitting: Cardiology

## 2011-10-27 DIAGNOSIS — F419 Anxiety disorder, unspecified: Secondary | ICD-10-CM

## 2011-12-11 ENCOUNTER — Encounter: Payer: Self-pay | Admitting: *Deleted

## 2011-12-19 DIAGNOSIS — H401290 Low-tension glaucoma, unspecified eye, stage unspecified: Secondary | ICD-10-CM | POA: Diagnosis not present

## 2011-12-19 DIAGNOSIS — H04129 Dry eye syndrome of unspecified lacrimal gland: Secondary | ICD-10-CM | POA: Diagnosis not present

## 2011-12-19 DIAGNOSIS — H409 Unspecified glaucoma: Secondary | ICD-10-CM | POA: Diagnosis not present

## 2011-12-20 ENCOUNTER — Encounter: Payer: Self-pay | Admitting: Internal Medicine

## 2011-12-20 ENCOUNTER — Ambulatory Visit (INDEPENDENT_AMBULATORY_CARE_PROVIDER_SITE_OTHER): Payer: Medicare Other | Admitting: Internal Medicine

## 2011-12-20 VITALS — BP 124/81 | HR 77 | Ht 66.3 in | Wt 149.4 lb

## 2011-12-20 DIAGNOSIS — I255 Ischemic cardiomyopathy: Secondary | ICD-10-CM

## 2011-12-20 DIAGNOSIS — Z95810 Presence of automatic (implantable) cardiac defibrillator: Secondary | ICD-10-CM | POA: Diagnosis not present

## 2011-12-20 DIAGNOSIS — I2589 Other forms of chronic ischemic heart disease: Secondary | ICD-10-CM | POA: Diagnosis not present

## 2011-12-20 DIAGNOSIS — I4891 Unspecified atrial fibrillation: Secondary | ICD-10-CM

## 2011-12-20 DIAGNOSIS — I119 Hypertensive heart disease without heart failure: Secondary | ICD-10-CM

## 2011-12-20 DIAGNOSIS — Z951 Presence of aortocoronary bypass graft: Secondary | ICD-10-CM

## 2011-12-20 DIAGNOSIS — I469 Cardiac arrest, cause unspecified: Secondary | ICD-10-CM

## 2011-12-20 LAB — ICD DEVICE OBSERVATION
AL AMPLITUDE: 2.4 mv
AL THRESHOLD: 0.875 V
BAMS-0001: 170 {beats}/min
BATTERY VOLTAGE: 3.0784 V
FVT: 0
RV LEAD AMPLITUDE: 3.1 mv
RV LEAD IMPEDENCE ICD: 342 Ohm
RV LEAD THRESHOLD: 1.25 V
TZAT-0001ATACH: 2
TZAT-0002SLOWVT: NEGATIVE
TZAT-0004FASTVT: 8
TZAT-0011FASTVT: 10 ms
TZAT-0012ATACH: 150 ms
TZAT-0012FASTVT: 170 ms
TZAT-0012SLOWVT: 170 ms
TZAT-0018ATACH: NEGATIVE
TZAT-0018SLOWVT: NEGATIVE
TZAT-0019ATACH: 6 V
TZAT-0019SLOWVT: 8 V
TZAT-0020ATACH: 1.5 ms
TZAT-0020ATACH: 1.5 ms
TZAT-0020ATACH: 1.5 ms
TZAT-0020FASTVT: 1.5 ms
TZON-0003VSLOWVT: 400 ms
TZON-0004VSLOWVT: 40
TZON-0005SLOWVT: 12
TZON-0010FASTVT: 30 ms
TZST-0001ATACH: 6
TZST-0001FASTVT: 4
TZST-0001SLOWVT: 4
TZST-0002ATACH: NEGATIVE
TZST-0002SLOWVT: NEGATIVE
TZST-0003FASTVT: 20 J
TZST-0003FASTVT: 35 J
TZST-0003FASTVT: 35 J
VF: 0

## 2011-12-20 MED ORDER — NITROGLYCERIN 0.4 MG SL SUBL: 0.4000 mg | SUBLINGUAL_TABLET | SUBLINGUAL | Status: DC | PRN

## 2011-12-20 NOTE — Patient Instructions (Addendum)
Remote monitoring is used to monitor your Pacemaker of ICD from home. This monitoring reduces the number of office visits required to check your device to one time per year. It allows [place] to keep an eye on the functioning of your device to ensure it is working properly. You are scheduled for a device check from home on 03/24/2012. You may send your transmission at any time that day. If you have a wireless device, the transmission will be sent automatically. After your physician reviews your transmission, you will receive a postcard with your next transmission date.  Your physician wants you to follow-up in: 12 months with Dr. [name].  You will receive a reminder letter in the mail two months in advance. If you don't receive a letter, please call our office to schedule the follow-up appointment.

## 2011-12-20 NOTE — Progress Notes (Signed)
[facility] Patient Care Team: [name], MD as PCP - General (Cardiology)   HPI  [name] is a 78 y.o. male Seen in followup for aborted cardiac arrest in the setting of ischemic heart disease prior bypass grafting and redo bypass grafting most recently in 1994 with subsequent PCI and mild aortic stenosis.He also has a history of Atrial fibrillation with Thrombo- embolic risk factors are notable for age vascular disease and hypertension and LV dysfunction.  His CHADS VASC score is 4.   Echo in June 2011 demonstrated ejection fraction of 35-40%.   Last catheterization in May 2011 patent LIMA and patent vein graft to the circumflex and PDA territory. There is mild aortic stenosis and mild left ventricular dysfunction. He has a history of multiple stents. The patient denies chest pain, shortness of breath, nocturnal dyspnea, orthopnea or peripheral edema. There have been no palpitations, lightheadedness or syncope.    Past Medical History  Diagnosis Date  . Cardiac arrest     aborted, atrial fibrillation/flutter  . Presence of automatic cardioverter/defibrillator (AICD)     Medtronic, remote-yes.   . Coronary artery disease   . Aortic stenosis   . Peripheral vascular disease   . Essential hypertension   . Hyperlipidemia   . Angina pectoris      Recurrent angina pectoris  . Claudication     Past Surgical History  Procedure Date  . Cabg     in 1982 and '84 with subsequent PTCI  . Appendectomy   . Tonsillectomy   . Adenoidectomy   . Cataract surgery      Bilateral cataract surgery  . Left elbow tendon surgery   . Cardiac catheterization  11/16/2009     Current Outpatient Prescriptions  Medication Sig Dispense Refill  . amLODipine (NORVASC) 5 MG tablet Take 1 tablet (5 mg total) by mouth every evening.  90 tablet  3  . aspirin 81 MG tablet Take 81 mg by mouth daily.        . clopidogrel (PLAVIX) 75 MG tablet Take 1 tablet (75 mg total) by mouth daily.  90 tablet  4  .  Coenzyme Q10 (CO Q 10 PO) Take by mouth daily.      . diazepam (VALIUM) 5 MG tablet TAKE ONE TABLET BY MOUTH EVERY DAY AS NEEDED  30 tablet  5  . furosemide (LASIX) 40 MG tablet Take 1 tablet (40 mg total) by mouth daily.  90 tablet  4  . isosorbide mononitrate (IMDUR) 60 MG 24 hr tablet Take 1 tablet (60 mg total) by mouth every morning.  90 tablet  4  . latanoprost (XALATAN) 0.005 % ophthalmic solution as directed.      Marland Kitchen lisinopril (PRINIVIL,ZESTRIL) 20 MG tablet Take 1 tablet (20 mg total) by mouth 2 (two) times daily.  180 tablet  3  . metoprolol tartrate (LOPRESSOR) 25 MG tablet Take 1 tablet (25 mg total) by mouth 2 (two) times daily. Taking 100mg  in the am and 75mg  in the pm  210 tablet  11  . Multiple Vitamin (MULTIVITAMIN) tablet Take 1 tablet by mouth daily.        . niacin (SLO-NIACIN) 500 MG tablet Take 500 mg by mouth at bedtime.        . nitroGLYCERIN (NITROSTAT) 0.4 MG SL tablet Place 1 tablet (0.4 mg total) under the tongue every 5 (five) minutes as needed.  25 tablet  prn  . potassium chloride (K-DUR,KLOR-CON) 10 MEQ tablet Take 10 mEq by mouth  2 (two) times daily.        . rosuvastatin (CRESTOR) 20 MG tablet Take 1 tablet (20 mg total) by mouth daily.  90 tablet  4  . Thiamine HCl (VITAMIN B-1) 100 MG tablet Take 100 mg by mouth daily.        Marland Kitchen DISCONTD: nitroGLYCERIN (NITROSTAT) 0.4 MG SL tablet Place 1 tablet (0.4 mg total) under the tongue every 5 (five) minutes as needed.  25 tablet  prn    Allergies  Allergen Reactions  . Cyclobenzaprine Hcl   . Tetracycline     Review of Systems negative except from HPI and PMH  Physical Exam BP 124/81  Pulse 77  Ht 5' 6.3" (1.684 m)  Wt 149 lb 6.4 oz (67.767 kg)  BMI 23.90 kg/m2 Well developed and well nourished in no acute distress HENT normal E scleral and icterus clear Neck Supple JVP flat; carotids a little delayed Clear to ausculation Regular rate and rhythm, 2/6 systolic murmur at the right upper stool border with a  split S2  Soft with active bowel sounds No clubbing cyanosis none Edema Alert and oriented, grossly normal motor and sensory function Skin Warm and Dry    Assessment and  Plan  Sudden cardiac death-aborted -  no intercurrent ventricular arrhythmias    Atrial fibrillation - No recurrent AFib;With his high CHADS-VASc score it would be appropriate that he be on oral anticoagulation; however, with 2 years of no atrial fibrillation is hard to argue with his current course. I do wonder whether he needs DAPtherapy   AUTOMATIC IMPLANTABLE CARDIAC DEFIBRILLATOR SITU  The patient's device was interrogated. The information was reviewed. No changes were made in the programming.   S/P CABG (coronary artery bypass graft) -   stble on current meds  As above  Ischemic Cardiomyopathy: I wonder whether he might be better served on a beta blocker like carvedilol compared to metoprolol   Benign hypertensive heart disease without heart failure -    Blood pressure is normal  I wonder whether his amlodipine could be stopped. I will defer this to Dr. Patty Sermons

## 2012-01-08 DIAGNOSIS — 419620001 Death: Secondary | SNOMED CT

## 2012-01-08 DEATH — deceased

## 2012-01-16 ENCOUNTER — Telehealth: Payer: Self-pay | Admitting: *Deleted

## 2012-01-16 DIAGNOSIS — Z23 Encounter for immunization: Secondary | ICD-10-CM | POA: Diagnosis not present

## 2012-01-16 NOTE — Telephone Encounter (Signed)
Left message to call back and schedule echo

## 2012-01-17 NOTE — Telephone Encounter (Signed)
Pt rtn your call

## 2012-01-17 NOTE — Telephone Encounter (Signed)
Patient returning nurse call, he can be reached at hm# °

## 2012-01-17 NOTE — Telephone Encounter (Signed)
Patient scheduled for echo

## 2012-01-18 ENCOUNTER — Ambulatory Visit (HOSPITAL_COMMUNITY): Payer: Medicare Other | Attending: Cardiovascular Disease

## 2012-01-18 ENCOUNTER — Other Ambulatory Visit (HOSPITAL_COMMUNITY): Payer: Self-pay | Admitting: Cardiology

## 2012-01-18 DIAGNOSIS — I379 Nonrheumatic pulmonary valve disorder, unspecified: Secondary | ICD-10-CM | POA: Insufficient documentation

## 2012-01-18 DIAGNOSIS — I369 Nonrheumatic tricuspid valve disorder, unspecified: Secondary | ICD-10-CM | POA: Insufficient documentation

## 2012-01-18 DIAGNOSIS — I359 Nonrheumatic aortic valve disorder, unspecified: Secondary | ICD-10-CM | POA: Diagnosis not present

## 2012-01-18 DIAGNOSIS — I4891 Unspecified atrial fibrillation: Secondary | ICD-10-CM | POA: Insufficient documentation

## 2012-01-18 DIAGNOSIS — I2510 Atherosclerotic heart disease of native coronary artery without angina pectoris: Secondary | ICD-10-CM | POA: Diagnosis not present

## 2012-01-18 DIAGNOSIS — I080 Rheumatic disorders of both mitral and aortic valves: Secondary | ICD-10-CM | POA: Insufficient documentation

## 2012-01-18 NOTE — Progress Notes (Signed)
Echocardiogram performed.  

## 2012-01-21 NOTE — Progress Notes (Signed)
Please report.  The recent echocardiogram shows significant improvement in his left ventricular function.  His left ventricular function is now normal.  The aortic valve shows more aortic stenosis since the previous study.  Continue on present medication and we will can discuss results further at his next office visit

## 2012-01-25 ENCOUNTER — Telehealth: Payer: Self-pay | Admitting: Cardiology

## 2012-01-25 NOTE — Telephone Encounter (Signed)
Message copied by Burnell Blanks on Fri Jan 25, 2012 10:46 AM ------      Message from: [name]      Created: Mon Jan 21, 2012  8:42 PM       Pl report.  LV function is much better. EF 55-60 %.  Aortic stenosis has increased. Continue same meds. Discuss at OV further.

## 2012-01-25 NOTE — Telephone Encounter (Signed)
New Problem: ° ° ° °Patient returned your call.  Please call back. °

## 2012-01-25 NOTE — Telephone Encounter (Signed)
Advised patient, states no changes in how he has been feeling. Advised if anything changes to call before November appointment

## 2012-01-25 NOTE — Telephone Encounter (Signed)
Message copied by Burnell Blanks on Fri Jan 25, 2012 10:52 AM ------      Message from: [name]      Created: Mon Jan 21, 2012  8:42 PM       Pl report.  LV function is much better. EF 55-60 %.  Aortic stenosis has increased. Continue same meds. Discuss at OV further.

## 2012-02-15 ENCOUNTER — Other Ambulatory Visit (INDEPENDENT_AMBULATORY_CARE_PROVIDER_SITE_OTHER): Payer: Medicare Other

## 2012-02-15 DIAGNOSIS — I2510 Atherosclerotic heart disease of native coronary artery without angina pectoris: Secondary | ICD-10-CM | POA: Diagnosis not present

## 2012-02-15 LAB — BASIC METABOLIC PANEL
Calcium: 10.1 mg/dL (ref 8.4–10.5)
Creatinine, Ser: 1.2 mg/dL (ref 0.4–1.5)

## 2012-02-15 LAB — HEPATIC FUNCTION PANEL
AST: 21 U/L (ref 0–37)
Albumin: 4.3 g/dL (ref 3.5–5.2)
Alkaline Phosphatase: 40 U/L (ref 39–117)
Total Protein: 7.1 g/dL (ref 6.0–8.3)

## 2012-02-15 LAB — LIPID PANEL
Cholesterol: 152 mg/dL (ref 0–200)
LDL Cholesterol: 82 mg/dL (ref 0–99)
Triglycerides: 112 mg/dL (ref 0.0–149.0)

## 2012-02-17 NOTE — Progress Notes (Signed)
Quick Note:  Please make copy of labs for patient visit. ______ 

## 2012-02-18 ENCOUNTER — Other Ambulatory Visit: Payer: Medicare Other

## 2012-02-19 ENCOUNTER — Ambulatory Visit (INDEPENDENT_AMBULATORY_CARE_PROVIDER_SITE_OTHER): Payer: Medicare Other | Admitting: Cardiology

## 2012-02-19 ENCOUNTER — Encounter: Payer: Self-pay | Admitting: Cardiology

## 2012-02-19 VITALS — BP 114/63 | HR 64 | Ht 66.0 in | Wt 151.0 lb

## 2012-02-19 DIAGNOSIS — I2589 Other forms of chronic ischemic heart disease: Secondary | ICD-10-CM

## 2012-02-19 DIAGNOSIS — Z951 Presence of aortocoronary bypass graft: Secondary | ICD-10-CM

## 2012-02-19 DIAGNOSIS — I359 Nonrheumatic aortic valve disorder, unspecified: Secondary | ICD-10-CM | POA: Diagnosis not present

## 2012-02-19 DIAGNOSIS — I119 Hypertensive heart disease without heart failure: Secondary | ICD-10-CM

## 2012-02-19 DIAGNOSIS — I255 Ischemic cardiomyopathy: Secondary | ICD-10-CM

## 2012-02-19 DIAGNOSIS — I4891 Unspecified atrial fibrillation: Secondary | ICD-10-CM

## 2012-02-19 DIAGNOSIS — I350 Nonrheumatic aortic (valve) stenosis: Secondary | ICD-10-CM

## 2012-02-19 DIAGNOSIS — I259 Chronic ischemic heart disease, unspecified: Secondary | ICD-10-CM

## 2012-02-19 DIAGNOSIS — E7800 Pure hypercholesterolemia, unspecified: Secondary | ICD-10-CM

## 2012-02-19 MED ORDER — METOPROLOL TARTRATE 50 MG PO TABS: ORAL_TABLET | ORAL | Status: DC

## 2012-02-19 MED ORDER — METOPROLOL TARTRATE 100 MG PO TABS: ORAL_TABLET | ORAL | Status: DC

## 2012-02-19 NOTE — Assessment & Plan Note (Signed)
His most recent ejection fraction was up to 55-60%.  We will continue him on his Lopressor for his beta blocker.

## 2012-02-19 NOTE — Assessment & Plan Note (Signed)
The patient has had no recurrence of his atrial fibrillation.  He is on aspirin and Plavix because of his severe ischemic heart disease.  He is not on warfarin.

## 2012-02-19 NOTE — Patient Instructions (Signed)
Rx sent in for your Lopressor (pill strength changed)  Your physician recommends that you continue on your current medications as directed. Please refer to the Current Medication list given to you today.  Your physician recommends that you schedule a follow-up appointment in: 4 months with fasting labs (lp/bmet/hfp/cbc)

## 2012-02-19 NOTE — Assessment & Plan Note (Signed)
We reviewed his labs today.  His lipids are satisfactory and has improved since previous visit.  His previous visit had occurred just after his return home from a cruise to [place].

## 2012-02-19 NOTE — Assessment & Plan Note (Signed)
The patient has exertional chest tightness worse in the cold weather, relieved by rest.  He has not had to take any sublingual nitroglycerin.

## 2012-02-19 NOTE — Assessment & Plan Note (Signed)
The patient had a recent echocardiogram which shows that he has moderate to severe aortic stenosis.  His peak systolic gradient he was 59 and a mean gradient of 36.  He has good LV function with EF having improved to 55-60%

## 2012-02-19 NOTE — Progress Notes (Signed)
[name] Date of Birth:  03-Apr-1934 [facility] [address] [place], [place]  [address] [phone]         Fax   [phone]  History of Present Illness: This pleasant 78 year old gentleman is seen for a scheduled four-month followup office visit. He has a past history of ischemic heart disease. He had coronary artery bypass graft surgery in 1982. He had a redo coronary artery bypass graft surgery in 1994. He has had multiple PCI procedures. He had an out of hospital cardiac arrest while playing tennis on 08/28/09 and he was resuscitated and has had a good functional recovery. His last cardiac catheterization on 09/06/09 showed no lesions amenable to catheter-based intervention. He does have a Medtronic ICD. He has been maintaining normal sinus rhythm since successful cardioversion from previous atrial fib. He has a history of  aortic stenosis.  Two-dimensional echocardiogram on 01/18/12 showed ejection fraction 55-60%.  He has moderate to severe aortic stenosis with peak gradient 59 and mean gradient 36.  Since last visit he has been doing well.   Current Outpatient Prescriptions  Medication Sig Dispense Refill  . amLODipine (NORVASC) 5 MG tablet Take 1 tablet (5 mg total) by mouth every evening.  90 tablet  3  . aspirin 81 MG tablet Take 81 mg by mouth daily.        . clopidogrel (PLAVIX) 75 MG tablet Take 1 tablet (75 mg total) by mouth daily.  90 tablet  4  . Coenzyme Q10 (CO Q 10 PO) Take by mouth daily.      . diazepam (VALIUM) 5 MG tablet TAKE ONE TABLET BY MOUTH EVERY DAY AS NEEDED  30 tablet  5  . furosemide (LASIX) 40 MG tablet Take 1 tablet (40 mg total) by mouth daily.  90 tablet  4  . isosorbide mononitrate (IMDUR) 60 MG 24 hr tablet Take 1 tablet (60 mg total) by mouth every morning.  90 tablet  4  . KRILL OIL PO Take 1 tablet by mouth daily.      Marland Kitchen latanoprost (XALATAN) 0.005 % ophthalmic solution as directed.      Marland Kitchen lisinopril (PRINIVIL,ZESTRIL)  20 MG tablet Take 1 tablet (20 mg total) by mouth 2 (two) times daily.  180 tablet  3  . metoprolol tartrate (LOPRESSOR) 100 MG tablet Taking 100mg  in the am  90 tablet  3  . Multiple Vitamin (MULTIVITAMIN) tablet Take 1 tablet by mouth daily.        . niacin (SLO-NIACIN) 500 MG tablet Take 500 mg by mouth at bedtime.        . nitroGLYCERIN (NITROSTAT) 0.4 MG SL tablet Place 1 tablet (0.4 mg total) under the tongue every 5 (five) minutes as needed.  25 tablet  prn  . potassium chloride (K-DUR,KLOR-CON) 10 MEQ tablet Take 10 mEq by mouth 2 (two) times daily.        . rosuvastatin (CRESTOR) 20 MG tablet Take 1 tablet (20 mg total) by mouth daily.  90 tablet  4  . Thiamine HCl (VITAMIN B-1) 100 MG tablet Take 100 mg by mouth daily.        . [DISCONTINUED] metoprolol tartrate (LOPRESSOR) 25 MG tablet Take 1 tablet (25 mg total) by mouth 2 (two) times daily. Taking 100mg  in the am and 75mg  in the pm  210 tablet  11  . metoprolol (LOPRESSOR) 50 MG tablet TAKE 1 AND 1/2 TABLETS IN THE EVENING  135 tablet  3  Allergies  Allergen Reactions  . Cyclobenzaprine Hcl   . Tetracycline     Patient Active Problem List  Diagnosis  . Atrial fibrillation  . ICD-Medtronic  . Back pain  . Hypercholesteremia  . Benign hypertensive heart disease without heart failure  . S/P CABG (coronary artery bypass graft)  . Sudden cardiac death-aborted  . Aortic stenosis, mild  . Ischemic cardiomyopathy    History  Smoking status  . Former Smoker  . Quit date: 10/25/1952  Smokeless tobacco  . Not on file    History  Alcohol Use No    Family History  Problem Relation Age of Onset  . Coronary artery disease Other     family history is dignificant for early CAD in several members    Review of Systems: Constitutional: no fever chills diaphoresis or fatigue or change in weight.  Head and neck: no hearing loss, no epistaxis, no photophobia or visual disturbance. Respiratory: No cough, shortness of breath  or wheezing. Cardiovascular: No chest pain peripheral edema, palpitations. Gastrointestinal: No abdominal distention, no abdominal pain, no change in bowel habits hematochezia or melena. Genitourinary: No dysuria, no frequency, no urgency, no nocturia. Musculoskeletal:No arthralgias, no back pain, no gait disturbance or myalgias. Neurological: No dizziness, no headaches, no numbness, no seizures, no syncope, no weakness, no tremors. Hematologic: No lymphadenopathy, no easy bruising. Psychiatric: No confusion, no hallucinations, no sleep disturbance.    Physical Exam: Filed Vitals:   02/19/12 0903  BP: 114/63  Pulse: 64   the general examination reveals a well-developed well-nourished gentleman in no distress.The head and neck exam reveals pupils equal and reactive.  Extraocular movements are full.  There is no scleral icterus.  The mouth and pharynx are normal.  The neck is supple.  The carotids reveal no bruits.  The jugular venous pressure is normal.  The  thyroid is not enlarged.  There is no lymphadenopathy.  The chest is clear to percussion and auscultation.  There are no rales or rhonchi.  Expansion of the chest is symmetrical.  The precordium is quiet.  The first heart sound is normal.  The second heart sound is physiologically split.  There is a grade 3/6 harsh systolic murmur of aortic stenosis at the base There is no abnormal lift or heave.  The abdomen is soft and nontender.  The bowel sounds are normal.  The liver and spleen are not enlarged.  There are no abdominal masses.  There are no abdominal bruits.  Extremities reveal good pedal pulses.  There is no phlebitis or edema.  There is no cyanosis or clubbing.  Strength is normal and symmetrical in all extremities.  There is no lateralizing weakness.  There are no sensory deficits.  The skin is warm and dry.  There is no rash.  EKG today shows atrial paced rhythm with bifascicular block   Assessment / Plan: Continue same medication.   Continue regular moderate exercise and stop exercise when he starts to feel angina.  Recheck in 4 months for followup office visit CBC and fasting lab work

## 2012-03-13 ENCOUNTER — Ambulatory Visit (INDEPENDENT_AMBULATORY_CARE_PROVIDER_SITE_OTHER): Payer: Medicare Other | Admitting: Internal Medicine

## 2012-03-13 ENCOUNTER — Telehealth: Payer: Self-pay | Admitting: Cardiology

## 2012-03-13 VITALS — BP 126/74 | HR 80 | Temp 97.4°F | Resp 16 | Ht 67.0 in | Wt 154.0 lb

## 2012-03-13 DIAGNOSIS — J029 Acute pharyngitis, unspecified: Secondary | ICD-10-CM | POA: Diagnosis not present

## 2012-03-13 LAB — POCT RAPID STREP A (OFFICE): Rapid Strep A Screen: NEGATIVE

## 2012-03-13 MED ORDER — AZITHROMYCIN 500 MG PO TABS: 500.0000 mg | ORAL_TABLET | Freq: Every day | ORAL | Status: DC

## 2012-03-13 NOTE — Telephone Encounter (Signed)
Spoke with patient and advised this office didn't do throat cultures and  Dr. Patty Sermons not in the office. Recommended patient go to [facility] for evaluation. Patient agreed.

## 2012-03-13 NOTE — Patient Instructions (Signed)

## 2012-03-13 NOTE — Telephone Encounter (Signed)
Pt wants to get a culture done because of a cold

## 2012-03-13 NOTE — Progress Notes (Signed)
  Subjective:    Patient ID: [name], male    DOB: [birth year], 78 y.o.   MRN: [id number]  HPI Worked school nursery, 2d later st started. ST for 2 days. No chest sxs   Review of Systems CAD    Objective:   Physical Exam  Vitals reviewed. Constitutional: He is oriented to person, place, and time. He appears well-developed and well-nourished.  HENT:  Mouth/Throat: Oropharyngeal exudate present.  Eyes: EOM are normal.  Neck: Neck supple.  Pulmonary/Chest: Effort normal.  Lymphadenopathy:    He has cervical adenopathy.  Neurological: He is alert and oriented to person, place, and time.  Skin: Skin is warm. No rash noted.  Psychiatric: He has a normal mood and affect.          Assessment & Plan:  Pharyngitis

## 2012-03-24 ENCOUNTER — Encounter: Payer: Self-pay | Admitting: Internal Medicine

## 2012-03-24 ENCOUNTER — Ambulatory Visit (INDEPENDENT_AMBULATORY_CARE_PROVIDER_SITE_OTHER): Payer: Medicare Other | Admitting: *Deleted

## 2012-03-24 DIAGNOSIS — Z95810 Presence of automatic (implantable) cardiac defibrillator: Secondary | ICD-10-CM | POA: Diagnosis not present

## 2012-03-24 DIAGNOSIS — I2589 Other forms of chronic ischemic heart disease: Secondary | ICD-10-CM

## 2012-03-24 DIAGNOSIS — I255 Ischemic cardiomyopathy: Secondary | ICD-10-CM

## 2012-04-03 ENCOUNTER — Encounter: Payer: Self-pay | Admitting: Vascular Surgery

## 2012-04-04 LAB — REMOTE ICD DEVICE
AL IMPEDENCE ICD: 551 Ohm
BATTERY VOLTAGE: 3.092 V
CHARGE TIME: 9.959 s
DEV-0020ICD: NEGATIVE
PACEART VT: 0
RV LEAD AMPLITUDE: 6.5 mv
RV LEAD IMPEDENCE ICD: 342 Ohm
TOT-0001: 1
TOT-0002: 1
TOT-0006: 20110601000000
TZAT-0001FASTVT: 1
TZAT-0001SLOWVT: 1
TZAT-0002ATACH: NEGATIVE
TZAT-0002ATACH: NEGATIVE
TZAT-0002SLOWVT: NEGATIVE
TZAT-0004FASTVT: 8
TZAT-0012ATACH: 150 ms
TZAT-0013FASTVT: 2
TZAT-0018ATACH: NEGATIVE
TZAT-0018FASTVT: NEGATIVE
TZAT-0019ATACH: 6 V
TZAT-0019ATACH: 6 V
TZAT-0019ATACH: 6 V
TZAT-0020ATACH: 1.5 ms
TZAT-0020FASTVT: 1.5 ms
TZON-0010FASTVT: 30 ms
TZON-0010SLOWVT: 30 ms
TZST-0001ATACH: 5
TZST-0001FASTVT: 2
TZST-0001FASTVT: 4
TZST-0001SLOWVT: 3
TZST-0001SLOWVT: 5
TZST-0002SLOWVT: NEGATIVE
TZST-0002SLOWVT: NEGATIVE
TZST-0003FASTVT: 30 J
TZST-0003FASTVT: 35 J

## 2012-04-15 ENCOUNTER — Encounter: Payer: Self-pay | Admitting: *Deleted

## 2012-05-02 ENCOUNTER — Other Ambulatory Visit: Payer: Self-pay

## 2012-05-02 ENCOUNTER — Telehealth: Payer: Self-pay | Admitting: Cardiology

## 2012-05-02 DIAGNOSIS — I509 Heart failure, unspecified: Secondary | ICD-10-CM

## 2012-05-02 DIAGNOSIS — I428 Other cardiomyopathies: Secondary | ICD-10-CM

## 2012-05-02 MED ORDER — AMLODIPINE BESYLATE 5 MG PO TABS: 5.0000 mg | ORAL_TABLET | Freq: Every evening | ORAL | Status: DC

## 2012-05-02 MED ORDER — FUROSEMIDE 40 MG PO TABS: 40.0000 mg | ORAL_TABLET | Freq: Every day | ORAL | Status: DC

## 2012-05-02 NOTE — Telephone Encounter (Signed)
New problem    Returning call back to nurse.   

## 2012-05-02 NOTE — Telephone Encounter (Signed)
Refilled Amlodipine and Lasix as requested

## 2012-05-14 DIAGNOSIS — L821 Other seborrheic keratosis: Secondary | ICD-10-CM | POA: Diagnosis not present

## 2012-05-14 DIAGNOSIS — Z85828 Personal history of other malignant neoplasm of skin: Secondary | ICD-10-CM | POA: Diagnosis not present

## 2012-05-14 DIAGNOSIS — L570 Actinic keratosis: Secondary | ICD-10-CM | POA: Diagnosis not present

## 2012-05-15 ENCOUNTER — Other Ambulatory Visit: Payer: Self-pay

## 2012-05-15 DIAGNOSIS — I10 Essential (primary) hypertension: Secondary | ICD-10-CM

## 2012-05-15 MED ORDER — LISINOPRIL 20 MG PO TABS: 20.0000 mg | ORAL_TABLET | Freq: Two times a day (BID) | ORAL | Status: DC

## 2012-05-19 ENCOUNTER — Telehealth: Payer: Self-pay | Admitting: Cardiology

## 2012-05-19 DIAGNOSIS — I10 Essential (primary) hypertension: Secondary | ICD-10-CM

## 2012-05-19 MED ORDER — LISINOPRIL 20 MG PO TABS: 20.0000 mg | ORAL_TABLET | Freq: Two times a day (BID) | ORAL | Status: DC

## 2012-05-19 NOTE — Telephone Encounter (Addendum)
Pt requesting refill of lisinopril, [facility] wants 90 day supply

## 2012-05-26 ENCOUNTER — Other Ambulatory Visit: Payer: Self-pay

## 2012-05-26 MED ORDER — ROSUVASTATIN CALCIUM 20 MG PO TABS: 20.0000 mg | ORAL_TABLET | Freq: Every day | ORAL | Status: DC

## 2012-06-12 ENCOUNTER — Other Ambulatory Visit: Payer: Medicare Other

## 2012-06-16 ENCOUNTER — Other Ambulatory Visit: Payer: Self-pay | Admitting: *Deleted

## 2012-06-16 DIAGNOSIS — I119 Hypertensive heart disease without heart failure: Secondary | ICD-10-CM

## 2012-06-16 DIAGNOSIS — Z951 Presence of aortocoronary bypass graft: Secondary | ICD-10-CM

## 2012-06-16 MED ORDER — ISOSORBIDE MONONITRATE ER 60 MG PO TB24: 60.0000 mg | ORAL_TABLET | ORAL | Status: DC

## 2012-06-18 ENCOUNTER — Ambulatory Visit: Payer: Medicare Other | Admitting: Cardiology

## 2012-06-30 ENCOUNTER — Other Ambulatory Visit: Payer: Self-pay

## 2012-06-30 ENCOUNTER — Ambulatory Visit (INDEPENDENT_AMBULATORY_CARE_PROVIDER_SITE_OTHER): Payer: Medicare Other | Admitting: *Deleted

## 2012-06-30 ENCOUNTER — Encounter: Payer: Self-pay | Admitting: Internal Medicine

## 2012-06-30 DIAGNOSIS — I2589 Other forms of chronic ischemic heart disease: Secondary | ICD-10-CM

## 2012-06-30 DIAGNOSIS — Z95810 Presence of automatic (implantable) cardiac defibrillator: Secondary | ICD-10-CM | POA: Diagnosis not present

## 2012-06-30 DIAGNOSIS — I255 Ischemic cardiomyopathy: Secondary | ICD-10-CM

## 2012-07-04 ENCOUNTER — Other Ambulatory Visit (INDEPENDENT_AMBULATORY_CARE_PROVIDER_SITE_OTHER): Payer: Medicare Other

## 2012-07-04 DIAGNOSIS — I119 Hypertensive heart disease without heart failure: Secondary | ICD-10-CM

## 2012-07-04 DIAGNOSIS — I4891 Unspecified atrial fibrillation: Secondary | ICD-10-CM

## 2012-07-04 LAB — BASIC METABOLIC PANEL
BUN: 36 mg/dL — ABNORMAL HIGH (ref 6–23)
GFR: 71.34 mL/min (ref >60.00)
Potassium: 3.7 mEq/L (ref 3.5–5.1)
Sodium: 141 mEq/L (ref 135–145)

## 2012-07-04 LAB — LIPID PANEL
Cholesterol: 152 mg/dL (ref 0–200)
HDL: 41.6 mg/dL (ref >39.00)
LDL Cholesterol: 93 mg/dL (ref 0–99)
Triglycerides: 86 mg/dL (ref 0.0–149.0)
VLDL: 17.2 mg/dL (ref 0.0–40.0)

## 2012-07-04 LAB — CBC WITH DIFFERENTIAL/PLATELET
Basophils Relative: 0.6 % (ref 0.0–3.0)
Eosinophils Relative: 2.4 % (ref 0.0–5.0)
HCT: 40.4 % (ref 39.0–52.0)
Lymphs Abs: 1.6 10*3/uL (ref 0.7–4.0)
Monocytes Relative: 8 % (ref 3.0–12.0)
Platelets: 115 10*3/uL — ABNORMAL LOW (ref 150.0–400.0)
RBC: 4.36 Mil/uL (ref 4.22–5.81)
WBC: 5.5 10*3/uL (ref 4.5–10.5)

## 2012-07-04 LAB — HEPATIC FUNCTION PANEL
ALT: 17 U/L (ref 0–53)
Albumin: 4.1 g/dL (ref 3.5–5.2)
Bilirubin, Direct: 0.1 mg/dL (ref 0.0–0.3)
Total Protein: 6.9 g/dL (ref 6.0–8.3)

## 2012-07-04 NOTE — Progress Notes (Signed)
Quick Note:  Please make copy of labs for patient visit. ______ 

## 2012-07-07 ENCOUNTER — Ambulatory Visit (INDEPENDENT_AMBULATORY_CARE_PROVIDER_SITE_OTHER): Payer: Medicare Other | Admitting: Cardiology

## 2012-07-07 ENCOUNTER — Encounter: Payer: Self-pay | Admitting: Cardiology

## 2012-07-07 VITALS — BP 122/68 | HR 54 | Ht 66.0 in | Wt 153.8 lb

## 2012-07-07 DIAGNOSIS — I259 Chronic ischemic heart disease, unspecified: Secondary | ICD-10-CM

## 2012-07-07 DIAGNOSIS — I350 Nonrheumatic aortic (valve) stenosis: Secondary | ICD-10-CM

## 2012-07-07 DIAGNOSIS — I4891 Unspecified atrial fibrillation: Secondary | ICD-10-CM

## 2012-07-07 DIAGNOSIS — I359 Nonrheumatic aortic valve disorder, unspecified: Secondary | ICD-10-CM | POA: Diagnosis not present

## 2012-07-07 DIAGNOSIS — E7800 Pure hypercholesterolemia, unspecified: Secondary | ICD-10-CM

## 2012-07-07 DIAGNOSIS — I2589 Other forms of chronic ischemic heart disease: Secondary | ICD-10-CM | POA: Diagnosis not present

## 2012-07-07 DIAGNOSIS — I255 Ischemic cardiomyopathy: Secondary | ICD-10-CM

## 2012-07-07 NOTE — Progress Notes (Signed)
[name] Date of Birth:  [birth year] [facility] [address] [place], [place]  [address] [phone]         Fax   [phone]  History of Present Illness: This pleasant 79 year old gentleman is seen for a scheduled four-month followup office visit. He has a past history of ischemic heart disease. He had coronary artery bypass graft surgery in 1982. He had a redo coronary artery bypass graft surgery in 1994. He has had multiple PCI procedures. He had an out of hospital cardiac arrest while playing tennis on 08/28/09 and he was resuscitated and has had a good functional recovery. His last cardiac catheterization on 09/06/09 showed no lesions amenable to catheter-based intervention. He does have a Medtronic ICD. He has been maintaining normal sinus rhythm since successful cardioversion from previous atrial fib. He has a history of aortic stenosis. Two-dimensional echocardiogram on 01/18/12 showed ejection fraction 55-60%. He has moderate to severe aortic stenosis with peak gradient 59 and mean gradient 36. Since last visit he has been doing well.   Current Outpatient Prescriptions  Medication Sig Dispense Refill  . amLODipine (NORVASC) 5 MG tablet Take 1 tablet (5 mg total) by mouth every evening.  90 tablet  3  . aspirin 81 MG tablet Take 81 mg by mouth daily.        . clopidogrel (PLAVIX) 75 MG tablet Take 1 tablet (75 mg total) by mouth daily.  90 tablet  4  . Coenzyme Q10 (CO Q 10 PO) Take by mouth daily.      . diazepam (VALIUM) 5 MG tablet TAKE ONE TABLET BY MOUTH EVERY DAY AS NEEDED  30 tablet  5  . furosemide (LASIX) 40 MG tablet Take 1 tablet (40 mg total) by mouth daily.  90 tablet  4  . isosorbide mononitrate (IMDUR) 60 MG 24 hr tablet Take 1 tablet (60 mg total) by mouth every morning.  90 tablet  1  . KRILL OIL PO Take 1 tablet by mouth daily.      Marland Kitchen latanoprost (XALATAN) 0.005 % ophthalmic solution as directed.      Marland Kitchen lisinopril (PRINIVIL,ZESTRIL) 20 MG  tablet Take 1 tablet (20 mg total) by mouth 2 (two) times daily.  180 tablet  3  . metoprolol (LOPRESSOR) 50 MG tablet Take 25 mg by mouth every evening.      . metoprolol tartrate (LOPRESSOR) 100 MG tablet Taking 100mg  in the am  90 tablet  3  . Multiple Vitamin (MULTIVITAMIN) tablet Take 1 tablet by mouth daily.        . niacin (SLO-NIACIN) 500 MG tablet Take 500 mg by mouth at bedtime.        . nitroGLYCERIN (NITROSTAT) 0.4 MG SL tablet Place 1 tablet (0.4 mg total) under the tongue every 5 (five) minutes as needed.  25 tablet  prn  . potassium chloride (K-DUR,KLOR-CON) 10 MEQ tablet Take 10 mEq by mouth 2 (two) times daily.        . rosuvastatin (CRESTOR) 20 MG tablet Take 1 tablet (20 mg total) by mouth daily.  90 tablet  4  . Thiamine HCl (VITAMIN B-1) 100 MG tablet Take 100 mg by mouth daily.         No current facility-administered medications for this visit.    Allergies  Allergen Reactions  . Cyclobenzaprine Hcl   . Tetracycline     Patient Active Problem List  Diagnosis  . Atrial fibrillation  . ICD-Medtronic  . Back  pain  . Hypercholesteremia  . Benign hypertensive heart disease without heart failure  . S/P CABG (coronary artery bypass graft)  . Sudden cardiac death-aborted  . Aortic stenosis, mild  . Ischemic cardiomyopathy    History  Smoking status  . Former Smoker  . Quit date: 10/25/1952  Smokeless tobacco  . Not on file    History  Alcohol Use No    Family History  Problem Relation Age of Onset  . Coronary artery disease Other     family history is dignificant for early CAD in several members    Review of Systems: Constitutional: no fever chills diaphoresis or fatigue or change in weight.  Head and neck: no hearing loss, no epistaxis, no photophobia or visual disturbance. Respiratory: No cough, shortness of breath or wheezing. Cardiovascular: No chest pain peripheral edema, palpitations. Gastrointestinal: No abdominal distention, no abdominal  pain, no change in bowel habits hematochezia or melena. Genitourinary: No dysuria, no frequency, no urgency, no nocturia. Musculoskeletal:No arthralgias, no back pain, no gait disturbance or myalgias. Neurological: No dizziness, no headaches, no numbness, no seizures, no syncope, no weakness, no tremors. Hematologic: No lymphadenopathy, no easy bruising. Psychiatric: No confusion, no hallucinations, no sleep disturbance.    Physical Exam: Filed Vitals:   07/07/12 0955  BP: 122/68  Pulse: 54   the general appearance reveals a well-developed well-nourished gentleman in no distress.The head and neck exam reveals pupils equal and reactive.  Extraocular movements are full.  There is no scleral icterus.  The mouth and pharynx are normal.  The neck is supple.  The carotids reveal no bruits.  The jugular venous pressure is normal.  The  thyroid is not enlarged.  There is no lymphadenopathy.  The chest is clear to percussion and auscultation.  There are no rales or rhonchi.  Expansion of the chest is symmetrical.  The precordium is quiet.  The first heart sound is normal.  The second heart sound is physiologically split.  There is no  gallop rub or click.  There is a grade 2/6 systolic murmur of aortic stenosis at the base  There is no abnormal lift or heave.  The abdomen is soft and nontender.  The bowel sounds are normal.  The liver and spleen are not enlarged.  There are no abdominal masses.  There are no abdominal bruits.  Extremities reveal good pedal pulses.  There is no phlebitis or edema.  There is no cyanosis or clubbing.  Strength is normal and symmetrical in all extremities.  There is no lateralizing weakness.  There are no sensory deficits.  The skin is warm and dry.  There is no rash.     Assessment / Plan: The patient will continue his current level of exercise.  He still plays golf but no longer plays tennis competitively.  He does hit tennis balls with his wife. He will be taking a river  cruise in [place] in June. Continue same medication.  Recheck in 4 months for followup office visit lipid panel hepatic function panel basal metabolic panel and EKG. We will plan to do another echocardiogram next fall.

## 2012-07-07 NOTE — Assessment & Plan Note (Signed)
The patient has had no further atrial fibrillation.  He is not on long-term anticoagulation because he is on aspirin and Plavix.  His recent EKG shows atrial paced rhythm and right bundle branch block.

## 2012-07-07 NOTE — Assessment & Plan Note (Signed)
The patient has a history of ischemic cardiomyopathy.  He has exertional angina pectoris.  His angina is manifested by throat pain.  Whenever he feels that it is related to exertion and he stops what he is doing and it goes away.  He stays physically active.  He plays golf.  He goes to the gym and works out. He has an ICD.  He has had no shocks.

## 2012-07-07 NOTE — Assessment & Plan Note (Signed)
Lipids are satisfactory on current therapy.  The patient is watching his diet.  Unfortunately his weight has gone up 2 pounds since last visit.

## 2012-07-07 NOTE — Patient Instructions (Addendum)
Your physician recommends that you continue on your current medications as directed. Please refer to the Current Medication list given to you today.  Your physician wants you to follow-up in: 4 months with fasting labs (lp/bmet/hfp) and ekg You will receive a reminder letter in the mail two months in advance. If you don't receive a letter, please call our office to schedule the follow-up appointment.  

## 2012-07-09 LAB — REMOTE ICD DEVICE
AL AMPLITUDE: 2.5 mv
AL IMPEDENCE ICD: 532 Ohm
ATRIAL PACING ICD: 88 pct
ATRIAL PACING ICD: 88 pct
BAMS-0001: 170 {beats}/min
BATTERY VOLTAGE: 3.0541 V
BRDY-0003RV: 130 {beats}/min
BRDY-0004RV: 120 {beats}/min
CHARGE TIME: 10.189 s
DEV-0020ICD: NEGATIVE
PACEART VT: 0
RV LEAD AMPLITUDE: 1.6 mv
RV LEAD IMPEDENCE ICD: 323 Ohm
RV LEAD IMPEDENCE ICD: 323 Ohm
TOT-0001: 1
TOT-0002: 1
TOT-0002: 1
TOT-0006: 20110601000000
TZAT-0001ATACH: 1
TZAT-0001ATACH: 2
TZAT-0001ATACH: 3
TZAT-0001FASTVT: 1
TZAT-0001SLOWVT: 1
TZAT-0001SLOWVT: 1
TZAT-0002ATACH: NEGATIVE
TZAT-0002ATACH: NEGATIVE
TZAT-0002ATACH: NEGATIVE
TZAT-0002ATACH: NEGATIVE
TZAT-0002SLOWVT: NEGATIVE
TZAT-0004FASTVT: 8
TZAT-0004FASTVT: 8
TZAT-0005FASTVT: 88 pct
TZAT-0011FASTVT: 10 ms
TZAT-0012ATACH: 150 ms
TZAT-0012ATACH: 150 ms
TZAT-0012ATACH: 150 ms
TZAT-0012SLOWVT: 170 ms
TZAT-0018ATACH: NEGATIVE
TZAT-0018ATACH: NEGATIVE
TZAT-0018ATACH: NEGATIVE
TZAT-0018FASTVT: NEGATIVE
TZAT-0018SLOWVT: NEGATIVE
TZAT-0018SLOWVT: NEGATIVE
TZAT-0019ATACH: 6 V
TZAT-0019ATACH: 6 V
TZAT-0019ATACH: 6 V
TZAT-0019FASTVT: 8 V
TZAT-0019SLOWVT: 8 V
TZAT-0020ATACH: 1.5 ms
TZAT-0020ATACH: 1.5 ms
TZAT-0020ATACH: 1.5 ms
TZAT-0020FASTVT: 1.5 ms
TZAT-0020SLOWVT: 1.5 ms
TZON-0003ATACH: 350 ms
TZON-0003ATACH: 350 ms
TZON-0003FASTVT: 250 ms
TZON-0003SLOWVT: 360 ms
TZON-0003VSLOWVT: 400 ms
TZON-0004SLOWVT: 32
TZON-0004VSLOWVT: 40
TZON-0005SLOWVT: 12
TZON-0010FASTVT: 30 ms
TZON-0010SLOWVT: 30 ms
TZST-0001ATACH: 4
TZST-0001ATACH: 4
TZST-0001ATACH: 5
TZST-0001ATACH: 5
TZST-0001ATACH: 6
TZST-0001FASTVT: 2
TZST-0001FASTVT: 2
TZST-0001FASTVT: 3
TZST-0001FASTVT: 4
TZST-0001FASTVT: 5
TZST-0001FASTVT: 6
TZST-0001SLOWVT: 2
TZST-0001SLOWVT: 3
TZST-0001SLOWVT: 4
TZST-0001SLOWVT: 4
TZST-0001SLOWVT: 5
TZST-0001SLOWVT: 5
TZST-0001SLOWVT: 6
TZST-0002ATACH: NEGATIVE
TZST-0002ATACH: NEGATIVE
TZST-0002ATACH: NEGATIVE
TZST-0002ATACH: NEGATIVE
TZST-0002SLOWVT: NEGATIVE
TZST-0002SLOWVT: NEGATIVE
TZST-0002SLOWVT: NEGATIVE
TZST-0002SLOWVT: NEGATIVE
TZST-0002SLOWVT: NEGATIVE
TZST-0003FASTVT: 20 J
TZST-0003FASTVT: 20 J
TZST-0003FASTVT: 35 J
TZST-0003FASTVT: 35 J
TZST-0003FASTVT: 35 J
VF: 0

## 2012-07-15 ENCOUNTER — Telehealth: Payer: Self-pay | Admitting: *Deleted

## 2012-07-15 DIAGNOSIS — F419 Anxiety disorder, unspecified: Secondary | ICD-10-CM

## 2012-07-15 MED ORDER — DIAZEPAM 5 MG PO TABS: ORAL_TABLET | ORAL | Status: DC

## 2012-07-15 NOTE — Telephone Encounter (Signed)
refill for valium 5 mg, of per Dr. Patty Sermons, phone in

## 2012-07-18 ENCOUNTER — Other Ambulatory Visit: Payer: Self-pay | Admitting: *Deleted

## 2012-07-18 DIAGNOSIS — F419 Anxiety disorder, unspecified: Secondary | ICD-10-CM

## 2012-07-20 MED ORDER — DIAZEPAM 5 MG PO TABS: ORAL_TABLET | ORAL | Status: DC

## 2012-07-23 ENCOUNTER — Telehealth: Payer: Self-pay

## 2012-07-23 MED ORDER — CLOPIDOGREL BISULFATE 75 MG PO TABS: 75.0000 mg | ORAL_TABLET | Freq: Every day | ORAL | Status: DC

## 2012-07-23 NOTE — Telephone Encounter (Signed)
pt called to rqst refill for plavix. [facility]. completed 90 R-2

## 2012-07-24 ENCOUNTER — Encounter: Payer: Self-pay | Admitting: *Deleted

## 2012-10-06 ENCOUNTER — Encounter: Payer: Self-pay | Admitting: Internal Medicine

## 2012-10-06 ENCOUNTER — Ambulatory Visit (INDEPENDENT_AMBULATORY_CARE_PROVIDER_SITE_OTHER): Payer: Medicare Other | Admitting: *Deleted

## 2012-10-06 DIAGNOSIS — I2589 Other forms of chronic ischemic heart disease: Secondary | ICD-10-CM

## 2012-10-06 DIAGNOSIS — Z95810 Presence of automatic (implantable) cardiac defibrillator: Secondary | ICD-10-CM

## 2012-10-06 DIAGNOSIS — I255 Ischemic cardiomyopathy: Secondary | ICD-10-CM

## 2012-10-07 LAB — REMOTE ICD DEVICE
AL IMPEDENCE ICD: 532 Ohm
ATRIAL PACING ICD: 91.06 pct
BAMS-0001: 170 {beats}/min
CHARGE TIME: 10.189 s
RV LEAD THRESHOLD: 1.125 V
TOT-0001: 1
TOT-0006: 20110601000000
TZAT-0001ATACH: 2
TZAT-0001ATACH: 3
TZAT-0001SLOWVT: 1
TZAT-0002ATACH: NEGATIVE
TZAT-0011FASTVT: 10 ms
TZAT-0012ATACH: 150 ms
TZAT-0012ATACH: 150 ms
TZAT-0012ATACH: 150 ms
TZAT-0012FASTVT: 170 ms
TZAT-0018ATACH: NEGATIVE
TZAT-0018FASTVT: NEGATIVE
TZAT-0018SLOWVT: NEGATIVE
TZAT-0019FASTVT: 8 V
TZAT-0020ATACH: 1.5 ms
TZON-0003ATACH: 350 ms
TZON-0003FASTVT: 250 ms
TZON-0003VSLOWVT: 400 ms
TZON-0004SLOWVT: 32
TZON-0004VSLOWVT: 40
TZON-0005SLOWVT: 12
TZON-0010FASTVT: 30 ms
TZON-0010SLOWVT: 30 ms
TZST-0001ATACH: 6
TZST-0001FASTVT: 3
TZST-0001FASTVT: 6
TZST-0001SLOWVT: 2
TZST-0001SLOWVT: 4
TZST-0001SLOWVT: 6
TZST-0002ATACH: NEGATIVE
TZST-0002SLOWVT: NEGATIVE
TZST-0002SLOWVT: NEGATIVE
TZST-0003FASTVT: 35 J
VENTRICULAR PACING ICD: 0.63 pct
VF: 0

## 2012-10-16 ENCOUNTER — Encounter: Payer: Self-pay | Admitting: *Deleted

## 2012-10-31 ENCOUNTER — Other Ambulatory Visit (INDEPENDENT_AMBULATORY_CARE_PROVIDER_SITE_OTHER): Payer: Medicare Other

## 2012-10-31 DIAGNOSIS — E7800 Pure hypercholesterolemia, unspecified: Secondary | ICD-10-CM | POA: Diagnosis not present

## 2012-10-31 LAB — LIPID PANEL
HDL: 45.7 mg/dL (ref >39.00)
Triglycerides: 99 mg/dL (ref 0.0–149.0)
VLDL: 19.8 mg/dL (ref 0.0–40.0)

## 2012-10-31 LAB — HEPATIC FUNCTION PANEL
ALT: 15 U/L (ref 0–53)
AST: 20 U/L (ref 0–37)
Albumin: 4.3 g/dL (ref 3.5–5.2)
Total Bilirubin: 0.7 mg/dL (ref 0.3–1.2)
Total Protein: 6.9 g/dL (ref 6.0–8.3)

## 2012-10-31 LAB — BASIC METABOLIC PANEL
Calcium: 10 mg/dL (ref 8.4–10.5)
GFR: 77.97 mL/min (ref >60.00)
Potassium: 4.5 mEq/L (ref 3.5–5.1)
Sodium: 139 mEq/L (ref 135–145)

## 2012-10-31 NOTE — Progress Notes (Signed)
Quick Note:  Please report to patient. The recent labs are stable. Continue same medication and careful diet. ______ 

## 2012-11-04 ENCOUNTER — Encounter: Payer: Self-pay | Admitting: Cardiology

## 2012-11-04 ENCOUNTER — Ambulatory Visit (INDEPENDENT_AMBULATORY_CARE_PROVIDER_SITE_OTHER): Payer: Medicare Other | Admitting: Cardiology

## 2012-11-04 VITALS — BP 126/74 | HR 64 | Ht 66.5 in | Wt 151.8 lb

## 2012-11-04 DIAGNOSIS — E7800 Pure hypercholesterolemia, unspecified: Secondary | ICD-10-CM

## 2012-11-04 DIAGNOSIS — I4891 Unspecified atrial fibrillation: Secondary | ICD-10-CM

## 2012-11-04 DIAGNOSIS — I2589 Other forms of chronic ischemic heart disease: Secondary | ICD-10-CM

## 2012-11-04 DIAGNOSIS — I255 Ischemic cardiomyopathy: Secondary | ICD-10-CM

## 2012-11-04 NOTE — Assessment & Plan Note (Signed)
The patient is not having any symptoms of congestive heart failure.  He exercises regularly at the gym.  He plays golf once a week

## 2012-11-04 NOTE — Assessment & Plan Note (Signed)
Lipids are stable on current therapy. 

## 2012-11-04 NOTE — Patient Instructions (Signed)
Your physician recommends that you continue on your current medications as directed. Please refer to the Current Medication list given to you today.  Your physician wants you to follow-up in: 4 months with fasting labs (lp/bmet/hfp) You will receive a reminder letter in the mail two months in advance. If you don't receive a letter, please call our office to schedule the follow-up appointment.  

## 2012-11-04 NOTE — Progress Notes (Signed)
[name] Date of Birth:  27-Sep-1933 [facility] [address] [place], [place]  [address] [phone]         Fax   [phone]  History of Present Illness: This pleasant 79 year old gentleman is seen for a scheduled four-month followup office visit. He has a past history of ischemic heart disease. He had coronary artery bypass graft surgery in 1982. He had a redo coronary artery bypass graft surgery in 1994. He has had multiple PCI procedures. He had an out of hospital cardiac arrest while playing tennis on 08/28/09 and he was resuscitated and has had a good functional recovery. His last cardiac catheterization on 09/06/09 showed no lesions amenable to catheter-based intervention. He does have a Medtronic ICD. He has been maintaining normal sinus rhythm since successful cardioversion from previous atrial fib. He has a history of aortic stenosis. Two-dimensional echocardiogram on 01/18/12 showed ejection fraction 55-60%. He has moderate to severe aortic stenosis with peak gradient 59 and mean gradient 36. Since last visit he has been doing well.   Current Outpatient Prescriptions  Medication Sig Dispense Refill  . amLODipine (NORVASC) 5 MG tablet Take 1 tablet (5 mg total) by mouth every evening.  90 tablet  3  . aspirin 81 MG tablet Take 81 mg by mouth daily.        . clopidogrel (PLAVIX) 75 MG tablet Take 1 tablet (75 mg total) by mouth daily.  90 tablet  2  . Coenzyme Q10 (CO Q 10 PO) Take by mouth daily.      . diazepam (VALIUM) 5 MG tablet TAKE ONE TABLET BY MOUTH EVERY DAY AS NEEDED  30 tablet  5  . furosemide (LASIX) 40 MG tablet Take 1 tablet (40 mg total) by mouth daily.  90 tablet  4  . isosorbide mononitrate (IMDUR) 60 MG 24 hr tablet Take 1 tablet (60 mg total) by mouth every morning.  90 tablet  1  . KRILL OIL PO Take 1 tablet by mouth daily.      Marland Kitchen latanoprost (XALATAN) 0.005 % ophthalmic solution as directed.      Marland Kitchen lisinopril (PRINIVIL,ZESTRIL) 20 MG  tablet Take 1 tablet (20 mg total) by mouth 2 (two) times daily.  180 tablet  3  . metoprolol (LOPRESSOR) 50 MG tablet Take 25 mg by mouth every evening.      . metoprolol tartrate (LOPRESSOR) 100 MG tablet Taking 100mg  in the am  90 tablet  3  . Multiple Vitamin (MULTIVITAMIN) tablet Take 1 tablet by mouth daily.        . niacin (SLO-NIACIN) 500 MG tablet Take 500 mg by mouth at bedtime.        . nitroGLYCERIN (NITROSTAT) 0.4 MG SL tablet Place 1 tablet (0.4 mg total) under the tongue every 5 (five) minutes as needed.  25 tablet  prn  . potassium chloride (K-DUR,KLOR-CON) 10 MEQ tablet Take 10 mEq by mouth 2 (two) times daily.        . rosuvastatin (CRESTOR) 20 MG tablet Take 1 tablet (20 mg total) by mouth daily.  90 tablet  4  . Thiamine HCl (VITAMIN B-1) 100 MG tablet Take 100 mg by mouth daily.         No current facility-administered medications for this visit.    Allergies  Allergen Reactions  . Cyclobenzaprine Hcl   . Tetracycline     Patient Active Problem List   Diagnosis Date Noted  . Back pain 07/10/2010  Priority: High  . Hypercholesteremia 07/10/2010    Priority: Medium  . Atrial fibrillation 10/21/2009    Priority: Medium  . Aortic stenosis, moderate 07/07/2012  . Ischemic cardiomyopathy 25-Dec-2011  . Sudden cardiac death-aborted October 18, 2010  . Benign hypertensive heart disease without heart failure 07/10/2010  . S/P CABG (coronary artery bypass graft) 07/10/2010  . ICD-Medtronic 03/30/2010    History  Smoking status  . Former Smoker  . Quit date: 10/25/1952  Smokeless tobacco  . Not on file    History  Alcohol Use No    Family History  Problem Relation Age of Onset  . Coronary artery disease Other     family history is dignificant for early CAD in several members    Review of Systems: Constitutional: no fever chills diaphoresis or fatigue or change in weight.  Head and neck: no hearing loss, no epistaxis, no photophobia or visual  disturbance. Respiratory: No cough, shortness of breath or wheezing. Cardiovascular: No chest pain peripheral edema, palpitations. Gastrointestinal: No abdominal distention, no abdominal pain, no change in bowel habits hematochezia or melena. Genitourinary: No dysuria, no frequency, no urgency, no nocturia. Musculoskeletal:No arthralgias, no back pain, no gait disturbance or myalgias. Neurological: No dizziness, no headaches, no numbness, no seizures, no syncope, no weakness, no tremors. Hematologic: No lymphadenopathy, no easy bruising. Psychiatric: No confusion, no hallucinations, no sleep disturbance.    Physical Exam: Filed Vitals:   11/04/12 0921  BP: 126/74  Pulse: 64   the general appearance reveals a well-developed well-nourished gentleman in no distress.  He has ICD in left upper chest.The head and neck exam reveals pupils equal and reactive.  Extraocular movements are full.  There is no scleral icterus.  The mouth and pharynx are normal.  The neck is supple.  The carotids reveal no bruits.  The jugular venous pressure is normal.  The  thyroid is not enlarged.  There is no lymphadenopathy.  The chest is clear to percussion and auscultation.  There are no rales or rhonchi.  Expansion of the chest is symmetrical.  The precordium is quiet.  The first heart sound is normal.  The second heart sound is physiologically split.  There is grade 2/6 systolic ejection murmur of aortic stenosis the base.  There is no abnormal lift or heave.  The abdomen is soft and nontender.  The bowel sounds are normal.  The liver and spleen are not enlarged.  There are no abdominal masses.  There are no abdominal bruits.  Extremities reveal good pedal pulses.  There is no phlebitis or edema.  There is no cyanosis or clubbing.  Strength is normal and symmetrical in all extremities.  There is no lateralizing weakness.  There are no sensory deficits.  The skin is warm and dry.  There is no rash.     Assessment /  Plan:  Continue same medication.  Recheck in 4 months for office visit and fasting lab work

## 2012-11-04 NOTE — Assessment & Plan Note (Signed)
The patient has had no recurrence of atrial fibrillation. 

## 2012-11-06 ENCOUNTER — Encounter: Payer: Self-pay | Admitting: Cardiology

## 2012-11-07 ENCOUNTER — Encounter: Payer: Self-pay | Admitting: Cardiology

## 2012-11-19 DIAGNOSIS — H01009 Unspecified blepharitis unspecified eye, unspecified eyelid: Secondary | ICD-10-CM | POA: Diagnosis not present

## 2012-11-19 DIAGNOSIS — H43819 Vitreous degeneration, unspecified eye: Secondary | ICD-10-CM | POA: Diagnosis not present

## 2012-11-19 DIAGNOSIS — E756 Lipid storage disorder, unspecified: Secondary | ICD-10-CM | POA: Diagnosis not present

## 2012-11-19 DIAGNOSIS — H18599 Other hereditary corneal dystrophies, unspecified eye: Secondary | ICD-10-CM | POA: Diagnosis not present

## 2012-11-19 DIAGNOSIS — H353190 Nonexudative age-related macular degeneration, unspecified eye, stage unspecified: Secondary | ICD-10-CM | POA: Diagnosis not present

## 2012-11-19 DIAGNOSIS — H401290 Low-tension glaucoma, unspecified eye, stage unspecified: Secondary | ICD-10-CM | POA: Diagnosis not present

## 2012-11-19 DIAGNOSIS — H26499 Other secondary cataract, unspecified eye: Secondary | ICD-10-CM | POA: Diagnosis not present

## 2012-12-18 ENCOUNTER — Ambulatory Visit (INDEPENDENT_AMBULATORY_CARE_PROVIDER_SITE_OTHER): Payer: Medicare Other | Admitting: Internal Medicine

## 2012-12-18 ENCOUNTER — Encounter: Payer: Self-pay | Admitting: Internal Medicine

## 2012-12-18 VITALS — BP 103/61 | HR 63 | Ht 66.0 in | Wt 150.0 lb

## 2012-12-18 DIAGNOSIS — I359 Nonrheumatic aortic valve disorder, unspecified: Secondary | ICD-10-CM | POA: Diagnosis not present

## 2012-12-18 DIAGNOSIS — Z95810 Presence of automatic (implantable) cardiac defibrillator: Secondary | ICD-10-CM

## 2012-12-18 DIAGNOSIS — I2589 Other forms of chronic ischemic heart disease: Secondary | ICD-10-CM | POA: Diagnosis not present

## 2012-12-18 DIAGNOSIS — I350 Nonrheumatic aortic (valve) stenosis: Secondary | ICD-10-CM

## 2012-12-18 DIAGNOSIS — I119 Hypertensive heart disease without heart failure: Secondary | ICD-10-CM

## 2012-12-18 DIAGNOSIS — I255 Ischemic cardiomyopathy: Secondary | ICD-10-CM

## 2012-12-18 LAB — ICD DEVICE OBSERVATION
AL IMPEDENCE ICD: 494 Ohm
AL THRESHOLD: 0.625 V
BATTERY VOLTAGE: 3.017 V
CHARGE TIME: 10.41 s
PACEART VT: 0
RV LEAD AMPLITUDE: 9.625 mv
TOT-0001: 1
TOT-0002: 1
TOT-0006: 20110601000000
TZAT-0001FASTVT: 1
TZAT-0001SLOWVT: 1
TZAT-0002ATACH: NEGATIVE
TZAT-0002ATACH: NEGATIVE
TZAT-0002SLOWVT: NEGATIVE
TZAT-0005FASTVT: 88 pct
TZAT-0012ATACH: 150 ms
TZAT-0012ATACH: 150 ms
TZAT-0013FASTVT: 2
TZAT-0018FASTVT: NEGATIVE
TZAT-0019ATACH: 6 V
TZAT-0019ATACH: 6 V
TZAT-0020ATACH: 1.5 ms
TZAT-0020ATACH: 1.5 ms
TZAT-0020FASTVT: 1.5 ms
TZAT-0020SLOWVT: 1.5 ms
TZON-0003ATACH: 350 ms
TZON-0003FASTVT: 250 ms
TZON-0003SLOWVT: 360 ms
TZON-0003VSLOWVT: 400 ms
TZON-0010FASTVT: 30 ms
TZON-0010SLOWVT: 30 ms
TZST-0001FASTVT: 2
TZST-0001FASTVT: 4
TZST-0001FASTVT: 6
TZST-0001SLOWVT: 3
TZST-0001SLOWVT: 4
TZST-0001SLOWVT: 5
TZST-0002SLOWVT: NEGATIVE
TZST-0003FASTVT: 30 J
TZST-0003FASTVT: 35 J
TZST-0003FASTVT: 35 J
VENTRICULAR PACING ICD: 0.57 pct

## 2012-12-18 MED ORDER — METOPROLOL TARTRATE 100 MG PO TABS: ORAL_TABLET | ORAL | Status: DC

## 2012-12-18 NOTE — Patient Instructions (Addendum)
Your physician has recommended you make the following change in your medication:  1) Decrease Metoprolol to 50mg  a day in the morning  Your physician wants you to follow-up in: one year with Dr. [name]. You will receive a reminder letter in the mail two months in advance. If you don't receive a letter, please call our office to schedule the follow-up appointment.  Your physician has requested that you have an echocardiogram. Echocardiography is a painless test that uses sound waves to create images of your heart. It provides your doctor with information about the size and shape of your heart and how well your heart's chambers and valves are working. This procedure takes approximately one hour. There are no restrictions for this procedure.   Remote monitoring is used to monitor your Pacemaker of ICD from home. This monitoring reduces the number of office visits required to check your device to one time per year. It allows [place] to keep an eye on the functioning of your device to ensure it is working properly. You are scheduled for a device check from home on 04/07/2013. You may send your transmission at any time that day. If you have a wireless device, the transmission will be sent automatically. After your physician reviews your transmission, you will receive a postcard with your next transmission date.

## 2012-12-18 NOTE — Progress Notes (Signed)
[name] Patient Care Team: [name], MD as PCP - General (Cardiology)   HPI  [name] is a 79 y.o. male Seen in followup for aborted cardiac arrest in the setting of ischemic heart disease prior bypass grafting and redo bypass grafting most recently in 1994 with subsequent PCI and mild aortic stenosis.He also has a history of Atrial fibrillation with Thrombo- embolic risk factors are notable for age vascular disease and hypertension and LV dysfunction.  His CHADS VASC score is 4.   Echo in  October 2013 demonstrated normal left ventricular function. Aortic valve demonstrated moderate-severe stenosis with a mean gradient of 36   Last catheterization in May 2011 patent LIMA and patent vein graft to the circumflex and PDA territory. There is mild aortic stenosis and mild left ventricular dysfunction. He has a history of multiple stents. The patient denies chest pain, shortness of breath, nocturnal dyspnea, orthopnea or peripheral edema. There have been no palpitations, lightheadedness or syncope.    Past Medical History  Diagnosis Date  . Cardiac arrest     aborted, atrial fibrillation/flutter  . Presence of automatic cardioverter/defibrillator (AICD)     Medtronic, remote-yes.   . Coronary artery disease   . Aortic stenosis   . Peripheral vascular disease   . Essential hypertension   . Hyperlipidemia   . Angina pectoris      Recurrent angina pectoris  . Claudication     Past Surgical History  Procedure Laterality Date  . Cabg      in 1982 and '84 with subsequent PTCI  . Appendectomy    . Tonsillectomy    . Adenoidectomy    . Cataract surgery       Bilateral cataract surgery  . Left elbow tendon surgery    . Cardiac catheterization   11/16/2009     Current Outpatient Prescriptions  Medication Sig Dispense Refill  . amLODipine (NORVASC) 5 MG tablet Take 1 tablet (5 mg total) by mouth every evening.  90 tablet  3  . aspirin 81 MG tablet Take 81 mg by mouth  daily.        . clopidogrel (PLAVIX) 75 MG tablet Take 1 tablet (75 mg total) by mouth daily.  90 tablet  2  . Coenzyme Q10 (CO Q 10 PO) Take by mouth daily.      . diazepam (VALIUM) 5 MG tablet TAKE ONE TABLET BY MOUTH EVERY DAY AS NEEDED  30 tablet  5  . furosemide (LASIX) 40 MG tablet Take 1 tablet (40 mg total) by mouth daily.  90 tablet  4  . isosorbide mononitrate (IMDUR) 60 MG 24 hr tablet Take 1 tablet (60 mg total) by mouth every morning.  90 tablet  1  . KRILL OIL PO Take 1 tablet by mouth daily.      Marland Kitchen latanoprost (XALATAN) 0.005 % ophthalmic solution as directed.      Marland Kitchen lisinopril (PRINIVIL,ZESTRIL) 20 MG tablet Take 1 tablet (20 mg total) by mouth 2 (two) times daily.  180 tablet  3  . metoprolol (LOPRESSOR) 50 MG tablet Take 25 mg by mouth every evening.      . metoprolol tartrate (LOPRESSOR) 100 MG tablet Taking 100mg  in the am  90 tablet  3  . Multiple Vitamin (MULTIVITAMIN) tablet Take 1 tablet by mouth daily.        . niacin (SLO-NIACIN) 500 MG tablet Take 500 mg by mouth at bedtime.        . nitroGLYCERIN (NITROSTAT)  0.4 MG SL tablet Place 1 tablet (0.4 mg total) under the tongue every 5 (five) minutes as needed.  25 tablet  prn  . potassium chloride (K-DUR,KLOR-CON) 10 MEQ tablet Take 10 mEq by mouth 2 (two) times daily.        . rosuvastatin (CRESTOR) 20 MG tablet Take 1 tablet (20 mg total) by mouth daily.  90 tablet  4  . Thiamine HCl (VITAMIN B-1) 100 MG tablet Take 100 mg by mouth daily.         No current facility-administered medications for this visit.    Allergies  Allergen Reactions  . Cyclobenzaprine Hcl   . Tetracycline     Review of Systems negative except from HPI and PMH  Physical Exam BP 103/61  Pulse 63  Ht 5\' 6"  (1.676 m)  Wt 150 lb (68.04 kg)  BMI 24.22 kg/m2 Well developed and well nourished in no acute distress HENT normal E scleral and icterus clear Neck Supple JVP flat; carotids a little delayed Clear to ausculation Regular rate and  rhythm, 36 systolic murmur at the right upper b border with a split S2  Soft with active bowel sounds No clubbing cyanosis none Edema Alert and oriented, grossly normal motor and sensory function Skin Warm and Dry  The echocardiogram demonstrates atrial pacing at 63 Intervals 30/11/43 with incomplete right bundle branch block  Assessment and  Plan  Sudden cardiac death-aborted -  no intercurrent ventricular arrhythmias    Atrial fibrillation - No recurrent AFib;With his high CHADS-VASc score it would be appropriate that he be on oral anticoagulation; however, with 2 years of no atrial fibrillation is hard to argue with his current course. I do wonder whether he needs DAPtherapy I asked to discuss this with Dr. Patty Sermons  AUTOMATIC IMPLANTABLE CARDIAC DEFIBRILLATOR SITU  The patient's device was interrogated. The information was reviewed. No changes were made in the programming.   S/P CABG (coronary artery bypass graft) -   stble on current meds  As above  Ischemic Cardiomyopathy: improved left ventricular function. Continue current course  Aortic stenosis: Echo 10/13 demonstrated moderate-severe aortic stenosis with a mean gradient of 36. We will repeat the echo in anticipation of his visit with Dr. Patty Sermons  Benign hypertensive heart disease without heart failure -    Blood pressure is on the low side; we will decrease the a.m. Metoprolol from 100--50 100 and

## 2012-12-22 ENCOUNTER — Other Ambulatory Visit: Payer: Self-pay | Admitting: *Deleted

## 2012-12-22 DIAGNOSIS — I119 Hypertensive heart disease without heart failure: Secondary | ICD-10-CM

## 2012-12-22 DIAGNOSIS — Z951 Presence of aortocoronary bypass graft: Secondary | ICD-10-CM

## 2012-12-22 MED ORDER — ISOSORBIDE MONONITRATE ER 60 MG PO TB24: 60.0000 mg | ORAL_TABLET | ORAL | Status: DC

## 2013-01-12 ENCOUNTER — Other Ambulatory Visit (HOSPITAL_COMMUNITY): Payer: Self-pay | Admitting: Internal Medicine

## 2013-01-12 ENCOUNTER — Encounter: Payer: Self-pay | Admitting: Internal Medicine

## 2013-01-12 ENCOUNTER — Ambulatory Visit (HOSPITAL_COMMUNITY): Payer: Medicare Other | Attending: Cardiology | Admitting: Radiology

## 2013-01-12 DIAGNOSIS — Z8674 Personal history of sudden cardiac arrest: Secondary | ICD-10-CM | POA: Insufficient documentation

## 2013-01-12 DIAGNOSIS — I4891 Unspecified atrial fibrillation: Secondary | ICD-10-CM | POA: Diagnosis not present

## 2013-01-12 DIAGNOSIS — I359 Nonrheumatic aortic valve disorder, unspecified: Secondary | ICD-10-CM | POA: Diagnosis not present

## 2013-01-12 DIAGNOSIS — I080 Rheumatic disorders of both mitral and aortic valves: Secondary | ICD-10-CM | POA: Insufficient documentation

## 2013-01-12 DIAGNOSIS — I2589 Other forms of chronic ischemic heart disease: Secondary | ICD-10-CM | POA: Insufficient documentation

## 2013-01-12 DIAGNOSIS — I255 Ischemic cardiomyopathy: Secondary | ICD-10-CM

## 2013-01-12 DIAGNOSIS — I10 Essential (primary) hypertension: Secondary | ICD-10-CM | POA: Diagnosis not present

## 2013-01-12 DIAGNOSIS — Z950 Presence of cardiac pacemaker: Secondary | ICD-10-CM | POA: Diagnosis not present

## 2013-01-12 DIAGNOSIS — I350 Nonrheumatic aortic (valve) stenosis: Secondary | ICD-10-CM

## 2013-01-12 NOTE — Progress Notes (Signed)
Echocardiogram performed.  

## 2013-01-15 DIAGNOSIS — Z23 Encounter for immunization: Secondary | ICD-10-CM | POA: Diagnosis not present

## 2013-01-16 ENCOUNTER — Telehealth: Payer: Self-pay | Admitting: *Deleted

## 2013-01-16 ENCOUNTER — Telehealth: Payer: Self-pay | Admitting: Cardiology

## 2013-01-16 NOTE — Telephone Encounter (Signed)
Advised of echo  

## 2013-01-16 NOTE — Telephone Encounter (Signed)
Message copied by Burnell Blanks on Fri Jan 16, 2013  9:12 AM ------      Message from: Duke Salvia      Created: Tue Jan 13, 2013  3:55 PM         fyi            Stable AS            steve      ----- Message -----         From: [facility]         Sent: 01/12/2013  12:56 PM           To: Duke Salvia, MD                   ------

## 2013-01-16 NOTE — Telephone Encounter (Signed)
Advised patient

## 2013-01-16 NOTE — Telephone Encounter (Signed)
New Problem  Pt request ECHO results// Please assist.

## 2013-02-12 ENCOUNTER — Other Ambulatory Visit: Payer: Self-pay

## 2013-02-20 ENCOUNTER — Other Ambulatory Visit (INDEPENDENT_AMBULATORY_CARE_PROVIDER_SITE_OTHER): Payer: Medicare Other

## 2013-02-20 DIAGNOSIS — Z961 Presence of intraocular lens: Secondary | ICD-10-CM | POA: Diagnosis not present

## 2013-02-20 DIAGNOSIS — H26499 Other secondary cataract, unspecified eye: Secondary | ICD-10-CM | POA: Diagnosis not present

## 2013-02-20 DIAGNOSIS — H04129 Dry eye syndrome of unspecified lacrimal gland: Secondary | ICD-10-CM | POA: Diagnosis not present

## 2013-02-20 DIAGNOSIS — H401290 Low-tension glaucoma, unspecified eye, stage unspecified: Secondary | ICD-10-CM | POA: Diagnosis not present

## 2013-02-20 DIAGNOSIS — E7800 Pure hypercholesterolemia, unspecified: Secondary | ICD-10-CM

## 2013-02-20 DIAGNOSIS — L719 Rosacea, unspecified: Secondary | ICD-10-CM | POA: Diagnosis not present

## 2013-02-20 DIAGNOSIS — H409 Unspecified glaucoma: Secondary | ICD-10-CM | POA: Diagnosis not present

## 2013-02-20 DIAGNOSIS — H01009 Unspecified blepharitis unspecified eye, unspecified eyelid: Secondary | ICD-10-CM | POA: Diagnosis not present

## 2013-02-20 LAB — BASIC METABOLIC PANEL
CO2: 29 mEq/L (ref 19–32)
Calcium: 10.3 mg/dL (ref 8.4–10.5)
Creatinine, Ser: 0.9 mg/dL (ref 0.4–1.5)
GFR: 92.89 mL/min (ref >60.00)
Glucose, Bld: 106 mg/dL — ABNORMAL HIGH (ref 70–99)
Potassium: 4.1 mEq/L (ref 3.5–5.1)
Sodium: 139 mEq/L (ref 135–145)

## 2013-02-20 LAB — HEPATIC FUNCTION PANEL
ALT: 17 U/L (ref 0–53)
AST: 20 U/L (ref 0–37)
Albumin: 4.3 g/dL (ref 3.5–5.2)
Alkaline Phosphatase: 49 U/L (ref 39–117)
Total Bilirubin: 0.9 mg/dL (ref 0.3–1.2)

## 2013-02-20 LAB — LIPID PANEL
Cholesterol: 168 mg/dL (ref 0–200)
HDL: 49.3 mg/dL (ref >39.00)
LDL Cholesterol: 99 mg/dL (ref 0–99)
Triglycerides: 101 mg/dL (ref 0.0–149.0)
VLDL: 20.2 mg/dL (ref 0.0–40.0)

## 2013-02-21 NOTE — Progress Notes (Signed)
Quick Note:  Please make copy of labs for patient visit. ______ 

## 2013-02-23 ENCOUNTER — Ambulatory Visit (INDEPENDENT_AMBULATORY_CARE_PROVIDER_SITE_OTHER): Payer: Medicare Other | Admitting: Cardiology

## 2013-02-23 ENCOUNTER — Encounter: Payer: Self-pay | Admitting: Cardiology

## 2013-02-23 VITALS — BP 125/80 | HR 84 | Ht 66.0 in | Wt 152.1 lb

## 2013-02-23 DIAGNOSIS — I2589 Other forms of chronic ischemic heart disease: Secondary | ICD-10-CM | POA: Diagnosis not present

## 2013-02-23 DIAGNOSIS — I4891 Unspecified atrial fibrillation: Secondary | ICD-10-CM

## 2013-02-23 DIAGNOSIS — I255 Ischemic cardiomyopathy: Secondary | ICD-10-CM

## 2013-02-23 DIAGNOSIS — I359 Nonrheumatic aortic valve disorder, unspecified: Secondary | ICD-10-CM

## 2013-02-23 DIAGNOSIS — I119 Hypertensive heart disease without heart failure: Secondary | ICD-10-CM

## 2013-02-23 DIAGNOSIS — E7800 Pure hypercholesterolemia, unspecified: Secondary | ICD-10-CM

## 2013-02-23 DIAGNOSIS — Z951 Presence of aortocoronary bypass graft: Secondary | ICD-10-CM

## 2013-02-23 DIAGNOSIS — I350 Nonrheumatic aortic (valve) stenosis: Secondary | ICD-10-CM

## 2013-02-23 NOTE — Assessment & Plan Note (Signed)
The patient has had no further episodes of atrial fibrillation.

## 2013-02-23 NOTE — Progress Notes (Signed)
[name] Date of Birth:  1933-12-18 [address] [place], [place]  [address] [phone]         Fax   [phone]  History of Present Illness: This pleasant 79 year old gentleman is seen for a scheduled four-month followup office visit. He has a past history of ischemic heart disease. He had coronary artery bypass graft surgery in 1982. He had a redo coronary artery bypass graft surgery in 1994. He has had multiple PCI procedures. He had an out of hospital cardiac arrest while playing tennis on 08/28/09 and he was resuscitated and has had a good functional recovery. His last cardiac catheterization on 09/06/09 showed no lesions amenable to catheter-based intervention. He does have a Medtronic ICD. He has been maintaining normal sinus rhythm since successful cardioversion from previous atrial fib. He has a history of aortic stenosis. Two-dimensional echocardiogram on 01/18/12 showed ejection fraction 55-60%. He has moderate to severe aortic stenosis with peak gradient 59 and mean gradient 36. Since last visit he has been doing well.   Current Outpatient Prescriptions  Medication Sig Dispense Refill  . amLODipine (NORVASC) 5 MG tablet Take 1 tablet (5 mg total) by mouth every evening.  90 tablet  3  . aspirin 81 MG tablet Take 81 mg by mouth daily.        . clopidogrel (PLAVIX) 75 MG tablet Take 1 tablet (75 mg total) by mouth daily.  90 tablet  2  . Coenzyme Q10 (CO Q 10 PO) Take 1 tablet by mouth daily.       . diazepam (VALIUM) 5 MG tablet TAKE ONE TABLET BY MOUTH EVERY DAY AS NEEDED  30 tablet  5  . furosemide (LASIX) 40 MG tablet Take 1 tablet (40 mg total) by mouth daily.  90 tablet  4  . isosorbide mononitrate (IMDUR) 60 MG 24 hr tablet Take 1 tablet (60 mg total) by mouth every morning.  90 tablet  1  . KRILL OIL PO Take 1 tablet by mouth daily.      Marland Kitchen latanoprost (XALATAN) 0.005 % ophthalmic solution as directed.      Marland Kitchen lisinopril (PRINIVIL,ZESTRIL) 20 MG tablet  Take 1 tablet (20 mg total) by mouth 2 (two) times daily.  180 tablet  3  . metoprolol (LOPRESSOR) 100 MG tablet Taking 50mg  in the am  90 tablet  3  . metoprolol (LOPRESSOR) 50 MG tablet Take 25 mg by mouth every evening.      . Multiple Vitamin (MULTIVITAMIN) tablet Take 1 tablet by mouth daily.        . niacin (SLO-NIACIN) 500 MG tablet Take 500 mg by mouth at bedtime.        . nitroGLYCERIN (NITROSTAT) 0.4 MG SL tablet Place 1 tablet (0.4 mg total) under the tongue every 5 (five) minutes as needed.  25 tablet  prn  . potassium chloride (K-DUR,KLOR-CON) 10 MEQ tablet Take 10 mEq by mouth 2 (two) times daily.        . rosuvastatin (CRESTOR) 20 MG tablet Take 1 tablet (20 mg total) by mouth daily.  90 tablet  4  . Thiamine HCl (VITAMIN B-1) 100 MG tablet Take 100 mg by mouth daily.         No current facility-administered medications for this visit.    Allergies  Allergen Reactions  . Cyclobenzaprine Hcl   . Tetracycline     Patient Active Problem List   Diagnosis Date Noted  . Back pain 07/10/2010  Priority: High  . Hypercholesteremia 07/10/2010    Priority: Medium  . Atrial fibrillation 10/21/2009    Priority: Medium  . Aortic stenosis, moderate 07/07/2012  . Ischemic cardiomyopathy 01-03-2012  . Sudden cardiac death-aborted 2010-10-27  . Benign hypertensive heart disease without heart failure 07/10/2010  . S/P CABG (coronary artery bypass graft) 07/10/2010  . ICD-Medtronic 03/30/2010    History  Smoking status  . Former Smoker  . Quit date: 10/25/1952  Smokeless tobacco  . Not on file    History  Alcohol Use No    Family History  Problem Relation Age of Onset  . Coronary artery disease Other     family history is dignificant for early CAD in several members    Review of Systems: Constitutional: no fever chills diaphoresis or fatigue or change in weight.  Head and neck: no hearing loss, no epistaxis, no photophobia or visual disturbance. Respiratory: No  cough, shortness of breath or wheezing. Cardiovascular: No chest pain peripheral edema, palpitations. Gastrointestinal: No abdominal distention, no abdominal pain, no change in bowel habits hematochezia or melena. Genitourinary: No dysuria, no frequency, no urgency, no nocturia. Musculoskeletal:No arthralgias, no back pain, no gait disturbance or myalgias. Neurological: No dizziness, no headaches, no numbness, no seizures, no syncope, no weakness, no tremors. Hematologic: No lymphadenopathy, no easy bruising. Psychiatric: No confusion, no hallucinations, no sleep disturbance.    Physical Exam: Filed Vitals:   02/23/13 0928  BP: 125/80  Pulse: 84   the general appearance reveals a well-developed well-nourished gentleman in no distress.  He has ICD in left upper chest.The head and neck exam reveals pupils equal and reactive.  Extraocular movements are full.  There is no scleral icterus.  The mouth and pharynx are normal.  The neck is supple.  The carotids reveal no bruits.  The jugular venous pressure is normal.  The  thyroid is not enlarged.  There is no lymphadenopathy.  The chest is clear to percussion and auscultation.  There are no rales or rhonchi.  Expansion of the chest is symmetrical.  The precordium is quiet.  The first heart sound is normal.  The second heart sound is physiologically split.  There is grade 2/6 systolic ejection murmur of aortic stenosis the base.  There is no abnormal lift or heave.  The abdomen is soft and nontender.  The bowel sounds are normal.  The liver and spleen are not enlarged.  There are no abdominal masses.  There are no abdominal bruits.  Extremities reveal good pedal pulses.  There is no phlebitis or edema.  There is no cyanosis or clubbing.  Strength is normal and symmetrical in all extremities.  There is no lateralizing weakness.  There are no sensory deficits.  The skin is warm and dry.  There is no rash.     Assessment / Plan:  Continue same  medication.  Recheck in 4 months for office visit and EKG and and fasting lab work.  After next visit consider followup 2-D echo. He is planning for a trip to [place] in the summer of 2015.  It was the trip that was canceled this past summer because the river cruiser had developed problems and was unexpectedly under repair

## 2013-02-23 NOTE — Assessment & Plan Note (Signed)
Exercise tolerance has remained stable.  No symptoms of CHF

## 2013-02-23 NOTE — Assessment & Plan Note (Signed)
Patient has widespread coronary artery disease.  He remains on aspirin and Plavix without any side effects.  He does have exertional throat tightness which is relieved by rest.  His symptoms have not changed or worsened since last visit.

## 2013-02-23 NOTE — Assessment & Plan Note (Signed)
Lab work remained stable since last visit.  Lipids are satisfactory for him.

## 2013-02-23 NOTE — Patient Instructions (Addendum)
Your physician recommends that you schedule a follow-up appointment in 4 months.  Your physician recommends that you return for Fasting lab work in 4 months (BMP, liver,lipid)  Your physician recommends that you continue on your current medications as directed. Please refer to the Current Medication list given to you today.

## 2013-03-02 ENCOUNTER — Other Ambulatory Visit: Payer: Self-pay | Admitting: *Deleted

## 2013-03-02 DIAGNOSIS — F419 Anxiety disorder, unspecified: Secondary | ICD-10-CM

## 2013-03-02 MED ORDER — DIAZEPAM 5 MG PO TABS: ORAL_TABLET | ORAL | Status: DC

## 2013-03-23 ENCOUNTER — Ambulatory Visit (INDEPENDENT_AMBULATORY_CARE_PROVIDER_SITE_OTHER): Payer: Medicare Other | Admitting: *Deleted

## 2013-03-23 DIAGNOSIS — Z95810 Presence of automatic (implantable) cardiac defibrillator: Secondary | ICD-10-CM

## 2013-03-23 DIAGNOSIS — I255 Ischemic cardiomyopathy: Secondary | ICD-10-CM

## 2013-03-23 DIAGNOSIS — I469 Cardiac arrest, cause unspecified: Secondary | ICD-10-CM

## 2013-03-23 DIAGNOSIS — I2589 Other forms of chronic ischemic heart disease: Secondary | ICD-10-CM | POA: Diagnosis not present

## 2013-03-23 LAB — MDC_IDC_ENUM_SESS_TYPE_REMOTE
Battery Voltage: 3.04 V
Brady Statistic AS VP Percent: 0.02 %
HighPow Impedance: 38 Ohm
HighPow Impedance: 46 Ohm
Lead Channel Impedance Value: 323 Ohm
Lead Channel Impedance Value: 494 Ohm
Lead Channel Pacing Threshold Pulse Width: 0.4 ms
Lead Channel Sensing Intrinsic Amplitude: 1.625 mV
Lead Channel Setting Pacing Amplitude: 2 V
Lead Channel Setting Pacing Amplitude: 2.5 V
Lead Channel Setting Sensing Sensitivity: 0.3 mV
Zone Setting Detection Interval: 300 ms
Zone Setting Detection Interval: 350 ms
Zone Setting Detection Interval: 400 ms

## 2013-03-25 DIAGNOSIS — H409 Unspecified glaucoma: Secondary | ICD-10-CM | POA: Diagnosis not present

## 2013-03-25 DIAGNOSIS — H401290 Low-tension glaucoma, unspecified eye, stage unspecified: Secondary | ICD-10-CM | POA: Diagnosis not present

## 2013-04-09 DIAGNOSIS — 419620001 Death: Secondary | SNOMED CT

## 2013-04-09 DEATH — deceased

## 2013-04-20 DIAGNOSIS — H04129 Dry eye syndrome of unspecified lacrimal gland: Secondary | ICD-10-CM | POA: Diagnosis not present

## 2013-04-20 DIAGNOSIS — H353190 Nonexudative age-related macular degeneration, unspecified eye, stage unspecified: Secondary | ICD-10-CM | POA: Diagnosis not present

## 2013-04-20 DIAGNOSIS — Z9849 Cataract extraction status, unspecified eye: Secondary | ICD-10-CM | POA: Diagnosis not present

## 2013-04-20 DIAGNOSIS — H401290 Low-tension glaucoma, unspecified eye, stage unspecified: Secondary | ICD-10-CM | POA: Diagnosis not present

## 2013-04-21 ENCOUNTER — Other Ambulatory Visit: Payer: Self-pay | Admitting: Cardiology

## 2013-04-22 ENCOUNTER — Telehealth: Payer: Self-pay | Admitting: Cardiology

## 2013-04-22 ENCOUNTER — Encounter: Payer: Self-pay | Admitting: *Deleted

## 2013-04-22 DIAGNOSIS — R6889 Other general symptoms and signs: Secondary | ICD-10-CM

## 2013-04-22 MED ORDER — OSELTAMIVIR PHOSPHATE 75 MG PO CAPS: 75.0000 mg | ORAL_CAPSULE | Freq: Two times a day (BID) | ORAL | Status: DC

## 2013-04-22 NOTE — Telephone Encounter (Signed)
Per wife patient felt very hot during the night and this am stated he did not feel well. Patient ached all over and just did not feel good. Did have temp of 100.8. Wife concerned he has the flu. Discussed with  Dr. Mare Ferrari and will Rx Tamiflu 75 mg bid x 5 days. Advised wife.

## 2013-04-22 NOTE — Telephone Encounter (Signed)
New message  Patient feels that he has the flu, please call and advise.

## 2013-04-28 ENCOUNTER — Encounter: Payer: Self-pay | Admitting: Internal Medicine

## 2013-05-06 ENCOUNTER — Other Ambulatory Visit: Payer: Self-pay

## 2013-05-06 DIAGNOSIS — I428 Other cardiomyopathies: Secondary | ICD-10-CM

## 2013-05-06 DIAGNOSIS — I509 Heart failure, unspecified: Secondary | ICD-10-CM

## 2013-05-06 MED ORDER — AMLODIPINE BESYLATE 5 MG PO TABS: 5.0000 mg | ORAL_TABLET | Freq: Every evening | ORAL | Status: DC

## 2013-05-12 DIAGNOSIS — L570 Actinic keratosis: Secondary | ICD-10-CM | POA: Diagnosis not present

## 2013-05-12 DIAGNOSIS — L821 Other seborrheic keratosis: Secondary | ICD-10-CM | POA: Diagnosis not present

## 2013-05-12 DIAGNOSIS — D239 Other benign neoplasm of skin, unspecified: Secondary | ICD-10-CM | POA: Diagnosis not present

## 2013-05-19 ENCOUNTER — Other Ambulatory Visit: Payer: Self-pay

## 2013-05-19 MED ORDER — FUROSEMIDE 40 MG PO TABS: 40.0000 mg | ORAL_TABLET | Freq: Every day | ORAL | Status: DC

## 2013-05-20 ENCOUNTER — Encounter: Payer: Self-pay | Admitting: Internal Medicine

## 2013-05-22 ENCOUNTER — Other Ambulatory Visit: Payer: Self-pay

## 2013-05-22 DIAGNOSIS — I119 Hypertensive heart disease without heart failure: Secondary | ICD-10-CM

## 2013-05-22 DIAGNOSIS — I10 Essential (primary) hypertension: Secondary | ICD-10-CM

## 2013-05-22 DIAGNOSIS — Z951 Presence of aortocoronary bypass graft: Secondary | ICD-10-CM

## 2013-05-22 MED ORDER — LISINOPRIL 20 MG PO TABS: 20.0000 mg | ORAL_TABLET | Freq: Two times a day (BID) | ORAL | Status: DC

## 2013-05-22 MED ORDER — METOPROLOL TARTRATE 100 MG PO TABS: ORAL_TABLET | ORAL | Status: DC

## 2013-05-22 MED ORDER — ISOSORBIDE MONONITRATE ER 60 MG PO TB24: 60.0000 mg | ORAL_TABLET | ORAL | Status: DC

## 2013-05-27 ENCOUNTER — Other Ambulatory Visit: Payer: Self-pay | Admitting: *Deleted

## 2013-05-27 ENCOUNTER — Other Ambulatory Visit: Payer: Self-pay

## 2013-05-27 DIAGNOSIS — I119 Hypertensive heart disease without heart failure: Secondary | ICD-10-CM

## 2013-05-27 MED ORDER — METOPROLOL TARTRATE 100 MG PO TABS: ORAL_TABLET | ORAL | Status: DC

## 2013-05-27 MED ORDER — METOPROLOL TARTRATE 50 MG PO TABS: ORAL_TABLET | ORAL | Status: DC

## 2013-05-27 NOTE — Telephone Encounter (Signed)
Spoke with patients pharmacy regarding patients Metoprolol. Called patient and verified he is taking Metoprolol Tart 100 mg 1/2 in the morning and 50 mg tablets 1/2 in the evening. Called pharmacy and updated Rx's

## 2013-05-28 ENCOUNTER — Other Ambulatory Visit: Payer: Self-pay

## 2013-05-28 MED ORDER — METOPROLOL TARTRATE 50 MG PO TABS: ORAL_TABLET | ORAL | Status: DC

## 2013-06-08 ENCOUNTER — Other Ambulatory Visit: Payer: Self-pay | Admitting: *Deleted

## 2013-06-08 MED ORDER — ROSUVASTATIN CALCIUM 20 MG PO TABS: 20.0000 mg | ORAL_TABLET | Freq: Every day | ORAL | Status: DC

## 2013-06-17 ENCOUNTER — Other Ambulatory Visit: Payer: Self-pay | Admitting: *Deleted

## 2013-06-17 MED ORDER — NITROGLYCERIN 0.4 MG SL SUBL: 0.4000 mg | SUBLINGUAL_TABLET | SUBLINGUAL | Status: DC | PRN

## 2013-06-24 ENCOUNTER — Encounter: Payer: Medicare Other | Admitting: *Deleted

## 2013-06-26 ENCOUNTER — Other Ambulatory Visit: Payer: Medicare Other

## 2013-06-29 ENCOUNTER — Other Ambulatory Visit: Payer: Medicare Other

## 2013-06-29 ENCOUNTER — Ambulatory Visit: Payer: Medicare Other | Admitting: Cardiology

## 2013-06-29 ENCOUNTER — Encounter: Payer: Self-pay | Admitting: *Deleted

## 2013-07-01 ENCOUNTER — Ambulatory Visit: Payer: Medicare Other | Admitting: Cardiology

## 2013-07-06 ENCOUNTER — Other Ambulatory Visit: Payer: Medicare Other

## 2013-07-09 ENCOUNTER — Ambulatory Visit: Payer: Medicare Other | Admitting: Cardiology

## 2013-07-13 ENCOUNTER — Encounter: Payer: Self-pay | Admitting: *Deleted

## 2013-07-20 ENCOUNTER — Other Ambulatory Visit (INDEPENDENT_AMBULATORY_CARE_PROVIDER_SITE_OTHER): Payer: Medicare Other

## 2013-07-20 ENCOUNTER — Ambulatory Visit (INDEPENDENT_AMBULATORY_CARE_PROVIDER_SITE_OTHER): Payer: Medicare Other

## 2013-07-20 DIAGNOSIS — Z951 Presence of aortocoronary bypass graft: Secondary | ICD-10-CM

## 2013-07-20 DIAGNOSIS — I255 Ischemic cardiomyopathy: Secondary | ICD-10-CM

## 2013-07-20 DIAGNOSIS — I2589 Other forms of chronic ischemic heart disease: Secondary | ICD-10-CM

## 2013-07-20 DIAGNOSIS — I509 Heart failure, unspecified: Secondary | ICD-10-CM | POA: Diagnosis not present

## 2013-07-20 DIAGNOSIS — I469 Cardiac arrest, cause unspecified: Secondary | ICD-10-CM

## 2013-07-20 DIAGNOSIS — I4891 Unspecified atrial fibrillation: Secondary | ICD-10-CM

## 2013-07-20 DIAGNOSIS — I359 Nonrheumatic aortic valve disorder, unspecified: Secondary | ICD-10-CM

## 2013-07-20 DIAGNOSIS — I119 Hypertensive heart disease without heart failure: Secondary | ICD-10-CM

## 2013-07-20 DIAGNOSIS — I350 Nonrheumatic aortic (valve) stenosis: Secondary | ICD-10-CM

## 2013-07-20 LAB — HEPATIC FUNCTION PANEL
ALK PHOS: 47 U/L (ref 39–117)
ALT: 15 U/L (ref 0–53)
AST: 23 U/L (ref 0–37)
Albumin: 4.3 g/dL (ref 3.5–5.2)
BILIRUBIN TOTAL: 0.7 mg/dL (ref 0.3–1.2)
Bilirubin, Direct: 0.1 mg/dL (ref 0.0–0.3)
Total Protein: 7.3 g/dL (ref 6.0–8.3)

## 2013-07-20 LAB — LIPID PANEL
Cholesterol: 163 mg/dL (ref 0–200)
HDL: 50 mg/dL (ref >39.00)
LDL Cholesterol: 99 mg/dL (ref 0–99)
TRIGLYCERIDES: 72 mg/dL (ref 0.0–149.0)
Total CHOL/HDL Ratio: 3
VLDL: 14.4 mg/dL (ref 0.0–40.0)

## 2013-07-20 LAB — BASIC METABOLIC PANEL
BUN: 20 mg/dL (ref 6–23)
CALCIUM: 10.1 mg/dL (ref 8.4–10.5)
CO2: 26 mEq/L (ref 19–32)
CREATININE: 0.9 mg/dL (ref 0.4–1.5)
Chloride: 104 mEq/L (ref 96–112)
GFR: 85.77 mL/min (ref >60.00)
GLUCOSE: 98 mg/dL (ref 70–99)
Potassium: 4.3 mEq/L (ref 3.5–5.1)
Sodium: 141 mEq/L (ref 135–145)

## 2013-07-20 NOTE — Progress Notes (Signed)
Quick Note:  Please make copy of labs for patient visit. ______ 

## 2013-07-22 LAB — MDC_IDC_ENUM_SESS_TYPE_REMOTE
Battery Voltage: 3.01 V
Brady Statistic AP VP Percent: 0.39 %
Brady Statistic AP VS Percent: 84.84 %
Brady Statistic AS VP Percent: 0.01 %
Brady Statistic AS VS Percent: 14.76 %
Brady Statistic RA Percent Paced: 85.23 %
Date Time Interrogation Session: 20150413201410
HIGH POWER IMPEDANCE MEASURED VALUE: 47 Ohm
HighPow Impedance: 36 Ohm
Lead Channel Impedance Value: 323 Ohm
Lead Channel Impedance Value: 475 Ohm
Lead Channel Pacing Threshold Amplitude: 0.75 V
Lead Channel Pacing Threshold Pulse Width: 0.4 ms
Lead Channel Sensing Intrinsic Amplitude: 2.625 mV
Lead Channel Sensing Intrinsic Amplitude: 2.625 mV
Lead Channel Sensing Intrinsic Amplitude: 5.5 mV
Lead Channel Sensing Intrinsic Amplitude: 5.5 mV
MDC IDC MSMT LEADCHNL RA PACING THRESHOLD PULSEWIDTH: 0.4 ms
MDC IDC MSMT LEADCHNL RV PACING THRESHOLD AMPLITUDE: 1.25 V
MDC IDC SET LEADCHNL RA PACING AMPLITUDE: 2 V
MDC IDC SET LEADCHNL RV PACING AMPLITUDE: 2.75 V
MDC IDC SET LEADCHNL RV PACING PULSEWIDTH: 0.4 ms
MDC IDC SET LEADCHNL RV SENSING SENSITIVITY: 0.3 mV
MDC IDC SET ZONE DETECTION INTERVAL: 350 ms
MDC IDC SET ZONE DETECTION INTERVAL: 400 ms
MDC IDC STAT BRADY RV PERCENT PACED: 0.4 %
Zone Setting Detection Interval: 250 ms
Zone Setting Detection Interval: 300 ms
Zone Setting Detection Interval: 360 ms

## 2013-07-24 ENCOUNTER — Ambulatory Visit (INDEPENDENT_AMBULATORY_CARE_PROVIDER_SITE_OTHER): Payer: Medicare Other | Admitting: Cardiology

## 2013-07-24 ENCOUNTER — Encounter: Payer: Self-pay | Admitting: Cardiology

## 2013-07-24 VITALS — BP 142/74 | HR 64 | Ht 66.0 in | Wt 155.0 lb

## 2013-07-24 DIAGNOSIS — I350 Nonrheumatic aortic (valve) stenosis: Secondary | ICD-10-CM

## 2013-07-24 DIAGNOSIS — I255 Ischemic cardiomyopathy: Secondary | ICD-10-CM

## 2013-07-24 DIAGNOSIS — F411 Generalized anxiety disorder: Secondary | ICD-10-CM

## 2013-07-24 DIAGNOSIS — I2589 Other forms of chronic ischemic heart disease: Secondary | ICD-10-CM | POA: Diagnosis not present

## 2013-07-24 DIAGNOSIS — I359 Nonrheumatic aortic valve disorder, unspecified: Secondary | ICD-10-CM | POA: Diagnosis not present

## 2013-07-24 DIAGNOSIS — I119 Hypertensive heart disease without heart failure: Secondary | ICD-10-CM | POA: Diagnosis not present

## 2013-07-24 DIAGNOSIS — F419 Anxiety disorder, unspecified: Secondary | ICD-10-CM

## 2013-07-24 DIAGNOSIS — E7800 Pure hypercholesterolemia, unspecified: Secondary | ICD-10-CM

## 2013-07-24 MED ORDER — DIAZEPAM 5 MG PO TABS: ORAL_TABLET | ORAL | Status: DC

## 2013-07-24 NOTE — Assessment & Plan Note (Signed)
She remains on dual and platelet therapy.  He has not been having any recurrent angina pectoris unless he tries to exercise right after eating.  In that case he will get some discomfort in his throat which is his anginal equivalent.

## 2013-07-24 NOTE — Progress Notes (Signed)
[name] Date of Birth:  1933-09-25 [address] [place], [place]  [address] [phone]         Fax   [phone]  History of Present Illness: This pleasant 80 year old gentleman is seen for a scheduled four-month followup office visit. He has a past history of ischemic heart disease. He had coronary artery bypass graft surgery in 1982. He had a redo coronary artery bypass graft surgery in 1994. He has had multiple PCI procedures. He had an out of hospital cardiac arrest while playing tennis on 08/28/09 and he was resuscitated and has had a good functional recovery. His last cardiac catheterization on 09/06/09 showed no lesions amenable to catheter-based intervention. He does have a Medtronic ICD. He has been maintaining normal sinus rhythm since successful cardioversion from previous atrial fib. He has a history of aortic stenosis.  His most recent echocardiogram on 01/12/13 showed normal left ventricular systolic function with ejection fraction 55-60%.  There is grade 1 diastolic dysfunction.  There is moderate aortic stenosis with peak gradient 61 and mean gradient 35.   Current Outpatient Prescriptions  Medication Sig Dispense Refill  . amLODipine (NORVASC) 5 MG tablet Take 1 tablet (5 mg total) by mouth every evening.  90 tablet  2  . aspirin 81 MG tablet Take 81 mg by mouth daily.        . clopidogrel (PLAVIX) 75 MG tablet TAKE ONE TABLET BY MOUTH ONCE DAILY  90 tablet  0  . Coenzyme Q10 (CO Q 10 PO) Take 1 tablet by mouth daily.       . diazepam (VALIUM) 5 MG tablet TAKE ONE TABLET BY MOUTH EVERY DAY AS NEEDED  30 tablet  5  . furosemide (LASIX) 40 MG tablet Take 1 tablet (40 mg total) by mouth daily.  90 tablet  1  . isosorbide mononitrate (IMDUR) 60 MG 24 hr tablet Take 1 tablet (60 mg total) by mouth every morning.  90 tablet  1  . KRILL OIL PO Take 1 tablet by mouth daily.      Marland Kitchen latanoprost (XALATAN) 0.005 % ophthalmic solution as directed.      Marland Kitchen  lisinopril (PRINIVIL,ZESTRIL) 20 MG tablet Take 1 tablet (20 mg total) by mouth 2 (two) times daily.  180 tablet  1  . metoprolol (LOPRESSOR) 100 MG tablet Taking 50mg  in the am  90 tablet  1  . metoprolol (LOPRESSOR) 50 MG tablet 1/2 tablet in the evening  90 tablet  3  . Multiple Vitamin (MULTIVITAMIN) tablet Take 1 tablet by mouth daily.        . niacin (SLO-NIACIN) 500 MG tablet Take 500 mg by mouth at bedtime.        . nitroGLYCERIN (NITROSTAT) 0.4 MG SL tablet Place 1 tablet (0.4 mg total) under the tongue every 5 (five) minutes as needed.  25 tablet  1  . oseltamivir (TAMIFLU) 75 MG capsule Take 1 capsule (75 mg total) by mouth 2 (two) times daily.  10 capsule  0  . potassium chloride (K-DUR,KLOR-CON) 10 MEQ tablet Take 10 mEq by mouth 2 (two) times daily.        . rosuvastatin (CRESTOR) 20 MG tablet Take 1 tablet (20 mg total) by mouth daily.  90 tablet  1  . Thiamine HCl (VITAMIN B-1) 100 MG tablet Take 100 mg by mouth daily.         No current facility-administered medications for this visit.    Allergies  Allergen Reactions  . Cyclobenzaprine Hcl   . Tetracycline     Patient Active Problem List   Diagnosis Date Noted  . Back pain 07/10/2010    Priority: High  . Hypercholesteremia 07/10/2010    Priority: Medium  . Atrial fibrillation 2020-10-309    Priority: Medium  . Aortic stenosis, moderate 07/07/2012  . Ischemic cardiomyopathy 01-01-12  . Sudden cardiac death-aborted October 25, 2010  . Benign hypertensive heart disease without heart failure 07/10/2010  . S/P CABG (coronary artery bypass graft) 07/10/2010  . ICD-Medtronic 03/30/2010    History  Smoking status  . Former Smoker  . Quit date: 10/25/1952  Smokeless tobacco  . Not on file    History  Alcohol Use No    Family History  Problem Relation Age of Onset  . Coronary artery disease Other     family history is dignificant for early CAD in several members    Review of Systems: Constitutional: no fever  chills diaphoresis or fatigue or change in weight.  Head and neck: no hearing loss, no epistaxis, no photophobia or visual disturbance. Respiratory: No cough, shortness of breath or wheezing. Cardiovascular: No chest pain peripheral edema, palpitations. Gastrointestinal: No abdominal distention, no abdominal pain, no change in bowel habits hematochezia or melena. Genitourinary: No dysuria, no frequency, no urgency, no nocturia. Musculoskeletal:No arthralgias, no back pain, no gait disturbance or myalgias. Neurological: No dizziness, no headaches, no numbness, no seizures, no syncope, no weakness, no tremors. Hematologic: No lymphadenopathy, no easy bruising. Psychiatric: No confusion, no hallucinations, no sleep disturbance.    Physical Exam: Filed Vitals:   07/24/13 0909  BP: 142/74  Pulse: 64   the general appearance reveals a well-developed well-nourished gentleman in no distress.  He has ICD in left upper chest.The head and neck exam reveals pupils equal and reactive.  Extraocular movements are full.  There is no scleral icterus.  The mouth and pharynx are normal.  The neck is supple.  The carotids reveal no bruits.  The jugular venous pressure is normal.  The  thyroid is not enlarged.  There is no lymphadenopathy.  The chest is clear to percussion and auscultation.  There are no rales or rhonchi.  Expansion of the chest is symmetrical.  The precordium is quiet.  The first heart sound is normal.  The second heart sound is physiologically split.  There is grade 2/6 systolic ejection murmur of aortic stenosis the base.  There is no abnormal lift or heave.  The abdomen is soft and nontender.  The bowel sounds are normal.  The liver and spleen are not enlarged.  There are no abdominal masses.  There are no abdominal bruits.  Extremities reveal good pedal pulses.  There is no phlebitis or edema.  There is no cyanosis or clubbing.  Strength is normal and symmetrical in all extremities.  There is no  lateralizing weakness.  There are no sensory deficits.  The skin is warm and dry.  There is no rash.     Assessment / Plan:  Continue same medication.  Recheck in 4 months for office visit and EKG and and fasting lab work.   He is planning for a trip to [place] in the July of 2015.  It was the trip that was canceled last summer because the river cruiser had developed problems and was unexpectedly under repair.

## 2013-07-24 NOTE — Patient Instructions (Signed)
Your physician recommends that you continue on your current medications as directed. Please refer to the Current Medication list given to you today.  Your physician wants you to follow-up in: 4 months with fasting labs (lp/bmet/hfp) You will receive a reminder letter in the mail two months in advance. If you don't receive a letter, please call our office to schedule the follow-up appointment.  

## 2013-07-24 NOTE — Assessment & Plan Note (Signed)
Blood work is satisfactory.  Patient is tolerating Crestor without side effects.

## 2013-07-24 NOTE — Assessment & Plan Note (Signed)
Blood pressure has been remaining stable on current therapy.  No orthopnea or paroxysmal nocturnal dyspnea.  Energy level is good and he goes to the gym to work out 3 days a week.

## 2013-07-28 ENCOUNTER — Other Ambulatory Visit: Payer: Self-pay | Admitting: *Deleted

## 2013-07-28 ENCOUNTER — Other Ambulatory Visit: Payer: Self-pay

## 2013-07-28 MED ORDER — CLOPIDOGREL BISULFATE 75 MG PO TABS: ORAL_TABLET | ORAL | Status: DC

## 2013-08-03 DIAGNOSIS — M19049 Primary osteoarthritis, unspecified hand: Secondary | ICD-10-CM | POA: Diagnosis not present

## 2013-08-06 ENCOUNTER — Encounter: Payer: Self-pay | Admitting: *Deleted

## 2013-08-07 DIAGNOSIS — 419620001 Death: Secondary | SNOMED CT

## 2013-08-07 DEATH — deceased

## 2013-08-19 DIAGNOSIS — Z961 Presence of intraocular lens: Secondary | ICD-10-CM | POA: Diagnosis not present

## 2013-08-19 DIAGNOSIS — H401290 Low-tension glaucoma, unspecified eye, stage unspecified: Secondary | ICD-10-CM | POA: Diagnosis not present

## 2013-08-19 DIAGNOSIS — H409 Unspecified glaucoma: Secondary | ICD-10-CM | POA: Diagnosis not present

## 2013-08-19 DIAGNOSIS — H353190 Nonexudative age-related macular degeneration, unspecified eye, stage unspecified: Secondary | ICD-10-CM | POA: Diagnosis not present

## 2013-08-19 DIAGNOSIS — H524 Presbyopia: Secondary | ICD-10-CM | POA: Diagnosis not present

## 2013-08-19 DIAGNOSIS — H04129 Dry eye syndrome of unspecified lacrimal gland: Secondary | ICD-10-CM | POA: Diagnosis not present

## 2013-08-24 ENCOUNTER — Encounter: Payer: Self-pay | Admitting: Internal Medicine

## 2013-10-21 ENCOUNTER — Ambulatory Visit (INDEPENDENT_AMBULATORY_CARE_PROVIDER_SITE_OTHER): Payer: Medicare Other | Admitting: *Deleted

## 2013-10-21 ENCOUNTER — Telehealth: Payer: Self-pay | Admitting: Cardiology

## 2013-10-21 DIAGNOSIS — I509 Heart failure, unspecified: Secondary | ICD-10-CM | POA: Diagnosis not present

## 2013-10-21 DIAGNOSIS — I255 Ischemic cardiomyopathy: Secondary | ICD-10-CM

## 2013-10-21 DIAGNOSIS — I469 Cardiac arrest, cause unspecified: Secondary | ICD-10-CM

## 2013-10-21 DIAGNOSIS — I2589 Other forms of chronic ischemic heart disease: Secondary | ICD-10-CM | POA: Diagnosis not present

## 2013-10-21 NOTE — Telephone Encounter (Signed)
Confirmed remote transmission appt with pt wife.

## 2013-10-22 NOTE — Progress Notes (Signed)
Remote ICD transmission.   

## 2013-10-23 LAB — MDC_IDC_ENUM_SESS_TYPE_REMOTE
Battery Voltage: 3.02 V
Brady Statistic AP VP Percent: 0.33 %
Brady Statistic AP VS Percent: 78.77 %
Brady Statistic AS VP Percent: 0.25 %
Brady Statistic RA Percent Paced: 79.1 %
Brady Statistic RV Percent Paced: 0.58 %
Date Time Interrogation Session: 20150715205410
HIGH POWER IMPEDANCE MEASURED VALUE: 53 Ohm
HighPow Impedance: 42 Ohm
Lead Channel Impedance Value: 342 Ohm
Lead Channel Pacing Threshold Amplitude: 0.75 V
Lead Channel Pacing Threshold Pulse Width: 0.4 ms
Lead Channel Sensing Intrinsic Amplitude: 2.375 mV
Lead Channel Sensing Intrinsic Amplitude: 7.25 mV
Lead Channel Sensing Intrinsic Amplitude: 7.25 mV
Lead Channel Setting Pacing Amplitude: 2 V
Lead Channel Setting Pacing Amplitude: 2.75 V
Lead Channel Setting Pacing Pulse Width: 0.4 ms
Lead Channel Setting Sensing Sensitivity: 0.3 mV
MDC IDC MSMT LEADCHNL RA IMPEDANCE VALUE: 532 Ohm
MDC IDC MSMT LEADCHNL RA PACING THRESHOLD PULSEWIDTH: 0.4 ms
MDC IDC MSMT LEADCHNL RA SENSING INTR AMPL: 2.375 mV
MDC IDC MSMT LEADCHNL RV PACING THRESHOLD AMPLITUDE: 1.375 V
MDC IDC SET ZONE DETECTION INTERVAL: 300 ms
MDC IDC STAT BRADY AS VS PERCENT: 20.65 %
Zone Setting Detection Interval: 250 ms
Zone Setting Detection Interval: 350 ms
Zone Setting Detection Interval: 360 ms
Zone Setting Detection Interval: 400 ms

## 2013-11-07 DIAGNOSIS — 419620001 Death: Secondary | SNOMED CT | POA: Diagnosis not present

## 2013-11-07 DEATH — deceased

## 2013-11-09 DIAGNOSIS — L821 Other seborrheic keratosis: Secondary | ICD-10-CM | POA: Diagnosis not present

## 2013-11-09 DIAGNOSIS — L819 Disorder of pigmentation, unspecified: Secondary | ICD-10-CM | POA: Diagnosis not present

## 2013-11-09 DIAGNOSIS — L570 Actinic keratosis: Secondary | ICD-10-CM | POA: Diagnosis not present

## 2013-11-11 ENCOUNTER — Encounter: Payer: Self-pay | Admitting: Cardiology

## 2013-11-13 ENCOUNTER — Encounter: Payer: Self-pay | Admitting: Cardiology

## 2013-11-16 ENCOUNTER — Encounter: Payer: Self-pay | Admitting: Internal Medicine

## 2013-11-25 ENCOUNTER — Other Ambulatory Visit (INDEPENDENT_AMBULATORY_CARE_PROVIDER_SITE_OTHER): Payer: Medicare Other

## 2013-11-25 DIAGNOSIS — E7800 Pure hypercholesterolemia, unspecified: Secondary | ICD-10-CM

## 2013-11-25 DIAGNOSIS — I119 Hypertensive heart disease without heart failure: Secondary | ICD-10-CM

## 2013-11-25 LAB — BASIC METABOLIC PANEL
BUN: 27 mg/dL — ABNORMAL HIGH (ref 6–23)
CO2: 28 mEq/L (ref 19–32)
CREATININE: 1 mg/dL (ref 0.4–1.5)
Calcium: 10.5 mg/dL (ref 8.4–10.5)
Chloride: 107 mEq/L (ref 96–112)
GFR: 78.67 mL/min (ref >60.00)
Glucose, Bld: 101 mg/dL — ABNORMAL HIGH (ref 70–99)
Potassium: 4.8 mEq/L (ref 3.5–5.1)
Sodium: 142 mEq/L (ref 135–145)

## 2013-11-25 LAB — LIPID PANEL
CHOLESTEROL: 147 mg/dL (ref 0–200)
HDL: 46.3 mg/dL (ref >39.00)
LDL Cholesterol: 85 mg/dL (ref 0–99)
NonHDL: 100.7
TRIGLYCERIDES: 77 mg/dL (ref 0.0–149.0)
Total CHOL/HDL Ratio: 3
VLDL: 15.4 mg/dL (ref 0.0–40.0)

## 2013-11-25 LAB — HEPATIC FUNCTION PANEL
ALBUMIN: 4.3 g/dL (ref 3.5–5.2)
ALK PHOS: 50 U/L (ref 39–117)
ALT: 15 U/L (ref 0–53)
AST: 18 U/L (ref 0–37)
Bilirubin, Direct: 0.1 mg/dL (ref 0.0–0.3)
TOTAL PROTEIN: 7.3 g/dL (ref 6.0–8.3)
Total Bilirubin: 0.9 mg/dL (ref 0.2–1.2)

## 2013-11-25 NOTE — Progress Notes (Signed)
Quick Note:  Please make copy of labs for patient visit. ______ 

## 2013-11-26 ENCOUNTER — Telehealth: Payer: Self-pay | Admitting: *Deleted

## 2013-11-26 NOTE — Telephone Encounter (Signed)
pt notified about lab results with verbal understanding and I will put a copy of labs in an envelope which he said he will pick up at appt 8/24.

## 2013-11-28 ENCOUNTER — Other Ambulatory Visit: Payer: Self-pay | Admitting: Cardiology

## 2013-11-30 ENCOUNTER — Ambulatory Visit (INDEPENDENT_AMBULATORY_CARE_PROVIDER_SITE_OTHER): Payer: Medicare Other | Admitting: Cardiology

## 2013-11-30 ENCOUNTER — Encounter: Payer: Self-pay | Admitting: Cardiology

## 2013-11-30 VITALS — BP 122/66 | HR 75 | Ht 66.0 in | Wt 153.0 lb

## 2013-11-30 DIAGNOSIS — I350 Nonrheumatic aortic (valve) stenosis: Secondary | ICD-10-CM

## 2013-11-30 DIAGNOSIS — E7800 Pure hypercholesterolemia, unspecified: Secondary | ICD-10-CM | POA: Diagnosis not present

## 2013-11-30 DIAGNOSIS — I359 Nonrheumatic aortic valve disorder, unspecified: Secondary | ICD-10-CM

## 2013-11-30 DIAGNOSIS — I255 Ischemic cardiomyopathy: Secondary | ICD-10-CM

## 2013-11-30 DIAGNOSIS — I119 Hypertensive heart disease without heart failure: Secondary | ICD-10-CM

## 2013-11-30 DIAGNOSIS — I2589 Other forms of chronic ischemic heart disease: Secondary | ICD-10-CM

## 2013-11-30 NOTE — Assessment & Plan Note (Signed)
The patient is on Crestor for his hypercholesterolemia and is ischemic heart disease.  He is not having any myalgias.  Lab work is satisfactory.

## 2013-11-30 NOTE — Assessment & Plan Note (Signed)
Blood pressure has been remaining stable on current therapy.  No dizziness or syncope. 

## 2013-11-30 NOTE — Assessment & Plan Note (Signed)
The patient recently returned from a trip to [place].  They took a river cruise.  The patient did not have any trouble walking on the tours et cetera.  No symptoms of congestive heart failure.  Since last visit his weight is down 2 pounds.

## 2013-11-30 NOTE — Patient Instructions (Signed)
Your physician recommends that you continue on your current medications as directed. Please refer to the Current Medication list given to you today.  Your physician recommends that you schedule a follow-up appointment in: 4 months with fasting labs (lp/bmet/hfp/cbc) and ekg

## 2013-11-30 NOTE — Progress Notes (Signed)
[name] Date of Birth:  [birth year] [facility] [address] [place], [place]  [address] [phone]        Fax   [phone]   History of Present Illness: This pleasant 80 year old gentleman is seen for a scheduled four-month followup office visit. He has a past history of ischemic heart disease. He had coronary artery bypass graft surgery in 1982. He had a redo coronary artery bypass graft surgery in 1994. He has had multiple PCI procedures. He had an out of hospital cardiac arrest while playing tennis on 08/28/09 and he was resuscitated and has had a good functional recovery. His last cardiac catheterization on 09/06/09 showed no lesions amenable to catheter-based intervention. He does have a Medtronic ICD. He has been maintaining normal sinus rhythm since successful cardioversion from previous atrial fib. He has a history of aortic stenosis. His most recent echocardiogram on 01/12/13 showed normal left ventricular systolic function with ejection fraction 55-60%. There is grade 1 diastolic dysfunction. There is moderate aortic stenosis with peak gradient 61 and mean gradient 35.  The patient has a history of erectile dysfunction.  He is not a candidate for Cialis because he is on daily nitrates.   Current Outpatient Prescriptions  Medication Sig Dispense Refill  . amLODipine (NORVASC) 5 MG tablet Take 1 tablet (5 mg total) by mouth every evening.  90 tablet  2  . aspirin 81 MG tablet Take 81 mg by mouth daily.        . clopidogrel (PLAVIX) 75 MG tablet TAKE ONE TABLET BY MOUTH ONCE DAILY  90 tablet  3  . Coenzyme Q10 (CO Q 10 PO) Take 1 tablet by mouth daily.       . diazepam (VALIUM) 5 MG tablet TAKE ONE TABLET BY MOUTH EVERY DAY AS NEEDED  30 tablet  5  . furosemide (LASIX) 40 MG tablet TAKE 1 TABLET BY MOUTH DAILY.  90 tablet  0  . isosorbide mononitrate (IMDUR) 60 MG 24 hr tablet Take 1 tablet (60 mg total) by mouth every morning.  90 tablet  1  . KRILL OIL  PO Take 1 tablet by mouth daily.      Marland Kitchen latanoprost (XALATAN) 0.005 % ophthalmic solution as directed.      Marland Kitchen lisinopril (PRINIVIL,ZESTRIL) 20 MG tablet Take 1 tablet (20 mg total) by mouth 2 (two) times daily.  180 tablet  1  . metoprolol (LOPRESSOR) 100 MG tablet Taking 50mg  in the am  90 tablet  1  . metoprolol (LOPRESSOR) 50 MG tablet 1/2 tablet in the evening  90 tablet  3  . Multiple Vitamin (MULTIVITAMIN) tablet Take 1 tablet by mouth daily.        . niacin (SLO-NIACIN) 500 MG tablet Take 500 mg by mouth at bedtime.        . nitroGLYCERIN (NITROSTAT) 0.4 MG SL tablet Place 1 tablet (0.4 mg total) under the tongue every 5 (five) minutes as needed.  25 tablet  1  . oseltamivir (TAMIFLU) 75 MG capsule Take 1 capsule (75 mg total) by mouth 2 (two) times daily.  10 capsule  0  . potassium chloride (K-DUR,KLOR-CON) 10 MEQ tablet Take 10 mEq by mouth 2 (two) times daily.        . rosuvastatin (CRESTOR) 20 MG tablet Take 1 tablet (20 mg total) by mouth daily.  90 tablet  1  . Thiamine HCl (VITAMIN B-1) 100 MG tablet Take 100 mg by mouth daily.  No current facility-administered medications for this visit.    Allergies  Allergen Reactions  . Cyclobenzaprine Hcl   . Tetracycline     Patient Active Problem List   Diagnosis Date Noted  . Back pain 07/10/2010    Priority: High  . Hypercholesteremia 07/10/2010    Priority: Medium  . Atrial fibrillation 07/07/2009    Priority: Medium  . Aortic stenosis, moderate 07/07/2012  . Ischemic cardiomyopathy 13-Jan-2012  . Sudden cardiac death-aborted 2010-11-06  . Benign hypertensive heart disease without heart failure 07/10/2010  . S/P CABG (coronary artery bypass graft) 07/10/2010  . ICD-Medtronic 03/30/2010    History  Smoking status  . Former Smoker  . Quit date: 10/25/1952  Smokeless tobacco  . Not on file    History  Alcohol Use No    Family History  Problem Relation Age of Onset  . Coronary artery disease Other      family history is dignificant for early CAD in several members    Review of Systems: Constitutional: no fever chills diaphoresis or fatigue or change in weight.  Head and neck: no hearing loss, no epistaxis, no photophobia or visual disturbance. Respiratory: No cough, shortness of breath or wheezing. Cardiovascular: No chest pain peripheral edema, palpitations. Gastrointestinal: No abdominal distention, no abdominal pain, no change in bowel habits hematochezia or melena. Genitourinary: No dysuria, no frequency, no urgency, no nocturia. Musculoskeletal:No arthralgias, no back pain, no gait disturbance or myalgias. Neurological: No dizziness, no headaches, no numbness, no seizures, no syncope, no weakness, no tremors. Hematologic: No lymphadenopathy, no easy bruising. Psychiatric: No confusion, no hallucinations, no sleep disturbance.    Physical Exam: Filed Vitals:   11/30/13 1550  BP: 122/66  Pulse: 75   the general appearance reveals a well-developed well-nourished gentleman in no distress.The head and neck exam reveals pupils equal and reactive.  Extraocular movements are full.  There is no scleral icterus.  The mouth and pharynx are normal.  The neck is supple.  The carotids reveal no bruits.  The jugular venous pressure is normal.  The  thyroid is not enlarged.  There is no lymphadenopathy.  The chest is clear to percussion and auscultation.  There are no rales or rhonchi.  Expansion of the chest is symmetrical.  Defibrillator in left upper chest. The precordium is quiet.  The first heart sound is normal.  The second heart sound is physiologically split.  There is no murmur gallop rub or click.  There is no abnormal lift or heave.  The abdomen is soft and nontender.  The bowel sounds are normal.  The liver and spleen are not enlarged.  There are no abdominal masses.  There are no abdominal bruits.  Extremities reveal good pedal pulses.  There is no phlebitis or edema.  There is no cyanosis  or clubbing.  Strength is normal and symmetrical in all extremities.  There is no lateralizing weakness.  There are no sensory deficits.  The skin is warm and dry.  There is no rash.     Assessment / Plan: 1.  Ischemic heart disease status post CABG in 1982, status post redo CABG in 1994.  Status post multiple PCI procedures. 2. history of out of hospital cardiac arrest while playing tennis on 08/28/09 with successful resuscitation.  He has a Medtronic ICD. 3.  Moderate aortic stenosis with peak gradient of 61 and mean gradient 35 by echo 01/12/13 4. left ventricular ejection fraction 55-60% with grade 1 diastolic dysfunction by echo 01/12/13 5. erectile  dysfunction, not a candidate for Cialis because he is on daily nitrates. 6.  Hypercholesterolemia on rosuvastatin  Disposition: Continue same medication.  Recheck in 4 months for office visit EKG CBC lipid panel hepatic function panel and basal metabolic panel

## 2013-12-15 ENCOUNTER — Other Ambulatory Visit: Payer: Self-pay | Admitting: Cardiology

## 2013-12-25 ENCOUNTER — Other Ambulatory Visit: Payer: Self-pay | Admitting: Cardiology

## 2014-01-13 DIAGNOSIS — Z23 Encounter for immunization: Secondary | ICD-10-CM | POA: Diagnosis not present

## 2014-01-14 ENCOUNTER — Encounter: Payer: Self-pay | Admitting: Internal Medicine

## 2014-01-14 ENCOUNTER — Ambulatory Visit (INDEPENDENT_AMBULATORY_CARE_PROVIDER_SITE_OTHER): Payer: Medicare Other | Admitting: Internal Medicine

## 2014-01-14 ENCOUNTER — Telehealth: Payer: Self-pay | Admitting: *Deleted

## 2014-01-14 VITALS — BP 132/70 | HR 87 | Ht 66.0 in | Wt 159.0 lb

## 2014-01-14 DIAGNOSIS — I472 Ventricular tachycardia, unspecified: Secondary | ICD-10-CM

## 2014-01-14 DIAGNOSIS — I4891 Unspecified atrial fibrillation: Secondary | ICD-10-CM

## 2014-01-14 DIAGNOSIS — I2589 Other forms of chronic ischemic heart disease: Secondary | ICD-10-CM

## 2014-01-14 DIAGNOSIS — I469 Cardiac arrest, cause unspecified: Secondary | ICD-10-CM

## 2014-01-14 DIAGNOSIS — Z95810 Presence of automatic (implantable) cardiac defibrillator: Secondary | ICD-10-CM

## 2014-01-14 DIAGNOSIS — I255 Ischemic cardiomyopathy: Secondary | ICD-10-CM | POA: Diagnosis not present

## 2014-01-14 LAB — MDC_IDC_ENUM_SESS_TYPE_INCLINIC
Battery Voltage: 2.98 V
Brady Statistic AP VP Percent: 0.4 %
Brady Statistic AP VS Percent: 78.9 %
Brady Statistic AS VP Percent: 0.07 %
Brady Statistic AS VS Percent: 20.62 %
Brady Statistic RA Percent Paced: 79.31 %
Brady Statistic RV Percent Paced: 0.48 %
Date Time Interrogation Session: 20151008102211
HIGH POWER IMPEDANCE MEASURED VALUE: 49 Ohm
HighPow Impedance: 37 Ohm
Lead Channel Impedance Value: 323 Ohm
Lead Channel Pacing Threshold Amplitude: 0.75 V
Lead Channel Pacing Threshold Amplitude: 1.5 V
Lead Channel Sensing Intrinsic Amplitude: 2.25 mV
Lead Channel Sensing Intrinsic Amplitude: 6.625 mV
Lead Channel Sensing Intrinsic Amplitude: 7.625 mV
Lead Channel Setting Pacing Amplitude: 2.5 V
Lead Channel Setting Pacing Pulse Width: 0.4 ms
Lead Channel Setting Sensing Sensitivity: 0.3 mV
MDC IDC MSMT LEADCHNL RA IMPEDANCE VALUE: 494 Ohm
MDC IDC MSMT LEADCHNL RA PACING THRESHOLD PULSEWIDTH: 0.4 ms
MDC IDC MSMT LEADCHNL RA SENSING INTR AMPL: 1.25 mV
MDC IDC MSMT LEADCHNL RV PACING THRESHOLD PULSEWIDTH: 0.4 ms
MDC IDC SET LEADCHNL RA PACING AMPLITUDE: 2 V
MDC IDC SET ZONE DETECTION INTERVAL: 300 ms
Zone Setting Detection Interval: 250 ms
Zone Setting Detection Interval: 350 ms
Zone Setting Detection Interval: 360 ms
Zone Setting Detection Interval: 400 ms

## 2014-01-14 NOTE — Patient Instructions (Signed)
Your physician recommends that you continue on your current medications as directed. Please refer to the Current Medication list given to you today.  Your physician has requested that you have an exercise tolerance test (with Dr. Caryl Comes in the next couple of weeks). For further information please visit [email or web address]. Please also follow instruction sheet, as given.

## 2014-01-14 NOTE — Progress Notes (Signed)
[name] Patient Care Team: Darlin Coco, MD as PCP - General (Cardiology)   HPI  [name] is a 80 y.o. male Seen in followup for aborted cardiac arrest in the setting of ischemic heart disease prior bypass grafting and redo bypass grafting most recently in 1994 with subsequent PCI and mild aortic stenosis.He also has a history of Atrial fibrillation with Thrombo- embolic risk factors are notable for age vascular disease and hypertension and LV dysfunction.  His CHADS VASC score is 4.   Echo in  October 2014 demonstrated normal left ventricular function. Aortic valve demonstrated moderate-severe stenosis with a mean gradient of 35 and peak 60   Last catheterization in May 2011 patent LIMA and patent vein graft to the circumflex and PDA territory. There is mild aortic stenosis and mild left ventricular dysfunction. He has a history of multiple stents. The patient denies chest pain, shortness of breath, nocturnal dyspnea, orthopnea or peripheral edema. There have been no palpitations, lightheadedness or syncope.   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 Unfortunately, we're find this information out for the first time today. He does not recall antibiotics exposure at that time  Metabolic profile was normal 8/15.    Past Medical History  Diagnosis Date  . Cardiac arrest     aborted, atrial fibrillation/flutter  . Presence of automatic cardioverter/defibrillator (AICD)     Medtronic, remote-yes.   . Coronary artery disease   . Aortic stenosis   . Peripheral vascular disease   . Essential hypertension   . Hyperlipidemia   . Angina pectoris      Recurrent angina pectoris  . Claudication     Past Surgical History  Procedure Laterality Date  . Cabg      in 1982 and '84 with subsequent PTCI  . Appendectomy    . Tonsillectomy    . Adenoidectomy    . Cataract  surgery       Bilateral cataract surgery  . Left elbow tendon surgery    . Cardiac catheterization   11/16/2009     Current Outpatient Prescriptions  Medication Sig Dispense Refill  . amLODipine (NORVASC) 5 MG tablet Take 1 tablet (5 mg total) by mouth every evening.  90 tablet  2  . aspirin 81 MG tablet Take 81 mg by mouth daily.        . clopidogrel (PLAVIX) 75 MG tablet TAKE ONE TABLET BY MOUTH ONCE DAILY  90 tablet  3  . Coenzyme Q10 (CO Q 10 PO) Take 1 tablet by mouth daily.       . CRESTOR 20 MG tablet TAKE 1 TABLET BY MOUTH DAILY.  90 tablet  1  . diazepam (VALIUM) 5 MG tablet TAKE ONE TABLET BY MOUTH EVERY DAY AS NEEDED  30 tablet  5  . furosemide (LASIX) 40 MG tablet TAKE 1 TABLET BY MOUTH DAILY.  90 tablet  0  . isosorbide mononitrate (IMDUR) 60 MG 24 hr tablet TAKE 1 TABLET BY MOUTH EVERY MORNING.  90 tablet  0  . KRILL OIL PO Take 1 tablet by mouth daily.      Marland Kitchen latanoprost (XALATAN) 0.005 % ophthalmic solution as directed.      Marland Kitchen lisinopril (PRINIVIL,ZESTRIL) 20 MG tablet TAKE 1 TABLET BY MOUTH 2 TIMES DAILY.  180 tablet  1  . Magnesium Oxide (MAG-OXIDE PO) Take 1 tablet by mouth daily.      Marland Kitchen  metoprolol (LOPRESSOR) 100 MG tablet Taking 50mg  in the am  90 tablet  1  . metoprolol (LOPRESSOR) 50 MG tablet 1/2 tablet in the evening  90 tablet  3  . Multiple Vitamin (MULTIVITAMIN) tablet Take 1 tablet by mouth daily.        . niacin (SLO-NIACIN) 500 MG tablet Take 500 mg by mouth at bedtime.        . nitroGLYCERIN (NITROSTAT) 0.4 MG SL tablet Place 1 tablet (0.4 mg total) under the tongue every 5 (five) minutes as needed.  25 tablet  1  . potassium chloride (K-DUR,KLOR-CON) 10 MEQ tablet Take 10 mEq by mouth 2 (two) times daily.        . Thiamine HCl (VITAMIN B-1) 100 MG tablet Take 100 mg by mouth daily.         No current facility-administered medications for this visit.    Allergies  Allergen Reactions  . Cyclobenzaprine Hcl     Unspecified reaction  . Tetracycline      Rash     Review of Systems negative except from HPI and PMH  Physical Exam BP 132/70  Pulse 87  Ht 5\' 6"  (1.676 m)  Wt 159 lb (72.122 kg)  BMI 25.68 kg/m2 Well developed and well nourished in no acute distress HENT normal E scleral and icterus clear Neck Supple JVP flat; carotids a little delayed Clear to ausculation Regular rate and rhythm, 3 this is 6 systolic murmur at the right upper   border with a split S2  Soft with active bowel sounds No clubbing cyanosis none Edema Alert and oriented, grossly normal motor and sensory function Skin Warm and Dry  The echocardiogram demonstrates atrial pacing at 63 Intervals 30/11/43 with incomplete right bundle branch block  Assessment and  Plan  Sudden cardiac death-aborted -    Atrial fibrillation - ll  AUTOMATIC IMPLANTABLE CARDIAC DEFIBRILLATOR SITU   .   S/P CABG (coronary artery bypass graft) -     Ischemic Cardiomyopathy  Aortic stenosis-asymptomatic-severe/moderate   Benign hypertensive heart disease without heart failure -   The patient has recurrent atrial fibrillation documented on his device. It would be appropriate to begin anticoagulation and I will forward this information to Dr. TB who is not in the office today.  In addition, he has had polymorphic ventricular tachycardia recurrently. Although it was all last February. There was no obvious QT prolongation and it occurred with activity which raises the unusual possibility of Catecholaminergic polymorphic ventricular tachycardia which we would see to elucidate by treadmill testing. We will do this in conjunction with Lexi  scan to exclude ischemia  Looking at the guidelines as relates to the aortic stenosis, he is on the borderline for serial evaluation on a 6-12 month schedule; however, Dr. Mare Ferrari has been following this for many years and is stable for the last year.  Without symptoms of ischemia

## 2014-01-14 NOTE — Telephone Encounter (Signed)
Referencing conversation during check in office today; called pt to let him know we are receiving all of his transmissions from [place]. Pt thankful all equipment working well. ROV for treadmill stress test 01/26/14.

## 2014-01-25 DIAGNOSIS — H02402 Unspecified ptosis of left eyelid: Secondary | ICD-10-CM | POA: Diagnosis not present

## 2014-01-25 DIAGNOSIS — H11432 Conjunctival hyperemia, left eye: Secondary | ICD-10-CM | POA: Diagnosis not present

## 2014-01-26 ENCOUNTER — Encounter: Payer: Medicare Other | Admitting: Internal Medicine

## 2014-01-26 ENCOUNTER — Other Ambulatory Visit: Payer: Self-pay | Admitting: *Deleted

## 2014-01-26 DIAGNOSIS — I472 Ventricular tachycardia, unspecified: Secondary | ICD-10-CM

## 2014-01-28 ENCOUNTER — Encounter: Payer: Self-pay | Admitting: Internal Medicine

## 2014-01-28 ENCOUNTER — Ambulatory Visit (HOSPITAL_COMMUNITY): Payer: Medicare Other | Attending: Internal Medicine | Admitting: Radiology

## 2014-01-28 VITALS — BP 120/64 | HR 68 | Ht 66.0 in | Wt 156.0 lb

## 2014-01-28 DIAGNOSIS — R0609 Other forms of dyspnea: Secondary | ICD-10-CM | POA: Insufficient documentation

## 2014-01-28 DIAGNOSIS — I10 Essential (primary) hypertension: Secondary | ICD-10-CM | POA: Insufficient documentation

## 2014-01-28 DIAGNOSIS — I4891 Unspecified atrial fibrillation: Secondary | ICD-10-CM | POA: Insufficient documentation

## 2014-01-28 DIAGNOSIS — I472 Ventricular tachycardia, unspecified: Secondary | ICD-10-CM

## 2014-01-28 DIAGNOSIS — R0600 Dyspnea, unspecified: Secondary | ICD-10-CM | POA: Diagnosis not present

## 2014-01-28 DIAGNOSIS — I469 Cardiac arrest, cause unspecified: Secondary | ICD-10-CM | POA: Diagnosis not present

## 2014-01-28 DIAGNOSIS — I2510 Atherosclerotic heart disease of native coronary artery without angina pectoris: Secondary | ICD-10-CM | POA: Diagnosis not present

## 2014-01-28 MED ORDER — TECHNETIUM TC 99M SESTAMIBI GENERIC - CARDIOLITE
10.0000 | Freq: Once | INTRAVENOUS | Status: AC | PRN
  Administered 2014-01-28: 10 via INTRAVENOUS

## 2014-01-28 MED ORDER — TECHNETIUM TC 99M SESTAMIBI GENERIC - CARDIOLITE
30.0000 | Freq: Once | INTRAVENOUS | Status: AC | PRN
  Administered 2014-01-28: 30 via INTRAVENOUS

## 2014-01-28 NOTE — Progress Notes (Signed)
[name] [facility] [address] [place], [place] [address] [phone]    Cardiology Nuclear Med Study  [name] is a 80 y.o. male     MRN : [id number]     DOB: Jul 03, 1933  Procedure Date: 01/28/2014  Nuclear Med Background Indication for Stress Test:  Evaluation for Ischemia, Follow up CAD, and Assess for Ventricular Arrhythmias with Exercise History:  CAD; 2008 MPI-anteroapical ischemia, EF 54%; AICD; Afib Cardiac Risk Factors: Hypertension  Symptoms:  DOE, and Exertional Throat Tightness    Nuclear Pre-Procedure Caffeine/Decaff Intake:  None NPO After: 6:30pm   Lungs:  clear O2 Sat: 95%% on room air. IV 0.9% NS with Angio Cath:  22g  IV Site: R Hand  IV Started by:  Annye Rusk, CNMT  Chest Size (in):  40 Cup Size: n/a  Height: 5\' 6"  (1.676 m)  Weight:  156 lb (70.761 kg)  BMI:  Body mass index is 25.19 kg/(m^2). Tech Comments:  Held lopressor 26 hrs. Patient took his Lopressor after completed the  treadmill. Irven Baltimore, RN.    Nuclear Med Study 1 or 2 day study: 1 day  Stress Test Type:  Stress  Reading MD: Fransico Him, MD  Order Authorizing Provider:  Olin Pia, MD; Vaughan Browner, MD  Resting Radionuclide: Technetium 9m Sestamibi  Resting Radionuclide Dose: 11.0 mCi   Stress Radionuclide:  Technetium 67m Sestamibi  Stress Radionuclide Dose: 33.0 mCi           Stress Protocol Rest HR: 68 Stress HR: 137  Rest BP: 120/64 Stress BP: 157/77  Exercise Time (min): 5:00 METS: 6.9   Predicted Max HR: 143 bpm % Max HR: 95.8 bpm Rate Pressure Product: 21509   Dose of Adenosine (mg):  n/a Dose of Lexiscan: n/a mg  Dose of Atropine (mg): n/a Dose of Dobutamine: n/a mcg/kg/min (at max HR)  Stress Test Technologist: Irven Baltimore, RN  Nuclear Technologist:  Earl Many, CNMT     Rest Procedure:  Myocardial perfusion imaging was performed at rest 45 minutes following the intravenous administration of Technetium 51m Sestamibi. Rest ECG:  NSR-RBBB  Stress Procedure:  The patient exercised on the treadmill utilizing the Bruce Protocol for 5:00 minutes, RPE=15. The patient stopped due to Throat Tightness and denied any chest pain.  Technetium 34m Sestamibi was injected at peak exercise and myocardial perfusion imaging was performed after a brief delay. Stress ECG: During exercise there was 44mm of J point depression with downsloping of the T waves in ther inferolateral leads with T wave inversions.    QPS Raw Data Images:  Mild diaphragmatic attenuation.  Normal left ventricular size. Stress Images:  There is decreased uptake in the anterior wall. Rest Images:  Normal homogeneous uptake in all areas of the myocardium. Subtraction (SDS):  Large sized, severe in intensity reversible defect involving the entire anterior wall, apex and mid an basal anteroseptum and anterolateral wall. Transient Ischemic Dilatation (Normal <1.22):  1.34 Lung/Heart Ratio (Normal <0.45):  0.35  Quantitative Gated Spect Images QGS EDV:  98 ml QGS ESV:  66 ml  Impression Exercise Capacity:  Fair exercise capacity. BP Response:  Normal blood pressure response. Clinical Symptoms:  Mild chest pain/dyspnea. ECG Impression:  Baseline RBBB with 80mm of horizontal ST segment depression with downsloping ST segments in the inferolateral walls during exercise. Comparison with Prior Nuclear Study: No images to compare  Overall Impression:  High risk stress nuclear study with a  large sized, severe in intensity  reversible defect involving the entire anterior wall, apex and mid an basal anteroseptum and anterolateral wall.  There is also evidence of ischemic dilatation.  LV Ejection Fraction: 32%.  LV Wall Motion:  Moderately to severely reduced LVF with septal dyskinesis secondary to RBBB and diffuse hypokinesis.  Signed: Fransico Him, MD [facility] 01/28/2014

## 2014-01-28 NOTE — Progress Notes (Signed)
The patient has a markedly abnormal Myoview scan with anterior wall dropout. Ejection fraction is 32% down for most recent echo where it was read as normal. He's having exertional angina manifested by neck discomfort. He will need catheterization; I reviewed this with Dr. Sherryl Barters nurse. He will see  The patient tomorrow morning at 8 AM. The patient is advised to go to hospital for any difficulties.

## 2014-01-29 ENCOUNTER — Ambulatory Visit (INDEPENDENT_AMBULATORY_CARE_PROVIDER_SITE_OTHER): Payer: Medicare Other | Admitting: Cardiology

## 2014-01-29 ENCOUNTER — Encounter (HOSPITAL_COMMUNITY): Payer: Self-pay | Admitting: Pharmacy Technician

## 2014-01-29 ENCOUNTER — Encounter: Payer: Self-pay | Admitting: Cardiology

## 2014-01-29 ENCOUNTER — Ambulatory Visit
Admission: RE | Admit: 2014-01-29 | Discharge: 2014-01-29 | Disposition: A | Discharge status: Home / Self Care | Payer: Medicare Other | Source: Ambulatory Visit | Attending: Cardiology | Admitting: Cardiology

## 2014-01-29 VITALS — BP 108/70 | HR 62 | Ht 66.0 in | Wt 156.0 lb

## 2014-01-29 DIAGNOSIS — E7800 Pure hypercholesterolemia, unspecified: Secondary | ICD-10-CM

## 2014-01-29 DIAGNOSIS — I259 Chronic ischemic heart disease, unspecified: Secondary | ICD-10-CM

## 2014-01-29 DIAGNOSIS — Z01818 Encounter for other preprocedural examination: Secondary | ICD-10-CM | POA: Diagnosis not present

## 2014-01-29 DIAGNOSIS — R0789 Other chest pain: Secondary | ICD-10-CM | POA: Diagnosis not present

## 2014-01-29 DIAGNOSIS — R9439 Abnormal result of other cardiovascular function study: Secondary | ICD-10-CM

## 2014-01-29 DIAGNOSIS — R931 Abnormal findings on diagnostic imaging of heart and coronary circulation: Secondary | ICD-10-CM | POA: Diagnosis not present

## 2014-01-29 LAB — BASIC METABOLIC PANEL
BUN: 29 mg/dL — ABNORMAL HIGH (ref 6–23)
CALCIUM: 10.2 mg/dL (ref 8.4–10.5)
CO2: 19 mEq/L (ref 19–32)
Chloride: 105 mEq/L (ref 96–112)
Creatinine, Ser: 1.3 mg/dL (ref 0.4–1.5)
GFR: 59.38 mL/min — AB (ref >60.00)
GLUCOSE: 119 mg/dL — AB (ref 70–99)
POTASSIUM: 4.3 meq/L (ref 3.5–5.1)
SODIUM: 140 meq/L (ref 135–145)

## 2014-01-29 LAB — CBC WITH DIFFERENTIAL/PLATELET
BASOS ABS: 0 10*3/uL (ref 0.0–0.1)
BASOS PCT: 0.5 % (ref 0.0–3.0)
Eosinophils Absolute: 0.2 10*3/uL (ref 0.0–0.7)
Eosinophils Relative: 3.8 % (ref 0.0–5.0)
HCT: 43.1 % (ref 39.0–52.0)
HEMOGLOBIN: 14.6 g/dL (ref 13.0–17.0)
LYMPHS ABS: 1.8 10*3/uL (ref 0.7–4.0)
LYMPHS PCT: 29.1 % (ref 12.0–46.0)
MCHC: 33.8 g/dL (ref 30.0–36.0)
MCV: 93.2 fl (ref 78.0–100.0)
MONOS PCT: 8.9 % (ref 3.0–12.0)
Monocytes Absolute: 0.5 10*3/uL (ref 0.1–1.0)
Neutro Abs: 3.5 10*3/uL (ref 1.4–7.7)
Neutrophils Relative %: 57.7 % (ref 43.0–77.0)
Platelets: 131 10*3/uL — ABNORMAL LOW (ref 150.0–400.0)
RBC: 4.63 Mil/uL (ref 4.22–5.81)
RDW: 13.3 % (ref 11.5–15.5)
WBC: 6.2 10*3/uL (ref 4.0–10.5)

## 2014-01-29 LAB — PROTIME-INR
INR: 1 ratio (ref 0.8–1.0)
Prothrombin Time: 10.8 s (ref 9.6–13.1)

## 2014-01-29 NOTE — Progress Notes (Signed)
Quick Note:  Please report to patient. The recent labs are stable. Continue same medication and careful diet. Blood tests are stable for cardiac catheterization. ______

## 2014-01-29 NOTE — Progress Notes (Signed)
[name] Date of Birth:  1933/10/28 [name] [address] [name], [name]  [address] [phone]        Fax   [phone]   History of Present Illness: This pleasant 80 year old gentleman is seen for a work in office visit. He has a past history of ischemic heart disease. He had coronary artery bypass graft surgery in 1982. He had a redo coronary artery bypass graft surgery in 1994. He has had multiple PCI procedures by Dr. Peter Martinique. He had an out of hospital cardiac arrest while playing tennis on 08/28/09 and he was resuscitated and has had a good functional recovery. His last cardiac catheterization on 09/06/09 showed no lesions amenable to catheter-based intervention. He does have a Medtronic ICD. He has been maintaining normal sinus rhythm since successful cardioversion from previous atrial fib. He has a history of aortic stenosis. His most recent echocardiogram on 01/12/13 showed normal left ventricular systolic function with ejection fraction 55-60%. There is grade 1 diastolic dysfunction. There is moderate aortic stenosis with peak gradient 61 and mean gradient 35.  The patient has a history of erectile dysfunction.  He is not a candidate for Cialis because he is on daily nitrates. 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 The patient had a treadmill Myoview stress test on 01/28/14 which is markedly abnormal.  The stress test revealed High risk stress nuclear study with a large sized, severe in intensity reversible defect involving the entire anterior wall, apex and mid an basal anteroseptum and anterolateral wall. There is also evidence of ischemic dilatation.   LV Ejection Fraction: 32%. LV Wall Motion: Moderately to severely reduced LVF with septal dyskinesis secondary to RBBB and diffuse hypokinesis. As noted, the patient has moderate to  severe aortic stenosis as well as significant ischemic heart disease.  He comes into the office today to discuss cardiac catheterization.  Of note is the fact that his ejection fraction has dropped significantly from his prior echocardiogram in October 2014.   Current Outpatient Prescriptions  Medication Sig Dispense Refill  . amLODipine (NORVASC) 5 MG tablet Take 1 tablet (5 mg total) by mouth every evening.  90 tablet  2  . aspirin 81 MG tablet Take 81 mg by mouth daily.        . Coenzyme Q10 (CO Q 10 PO) Take 1 tablet by mouth daily.       Marland Kitchen KRILL OIL PO Take 1 tablet by mouth daily.      Marland Kitchen latanoprost (XALATAN) 0.005 % ophthalmic solution Place 1 drop into both eyes at bedtime.       . Magnesium Oxide (MAG-OXIDE PO) Take 1 tablet by mouth daily.      . metoprolol (LOPRESSOR) 50 MG tablet Take 25 mg tablet in the evening      . Multiple Vitamin (MULTIVITAMIN) tablet Take 1 tablet by mouth daily.        . niacin (SLO-NIACIN) 500 MG tablet Take 500 mg by mouth at bedtime.        . nitroGLYCERIN (NITROSTAT) 0.4 MG SL tablet Place 1 tablet (0.4 mg total) under the tongue every 5 (five) minutes as needed.  25 tablet  1  . potassium chloride (K-DUR,KLOR-CON) 10 MEQ tablet Take 10 mEq by mouth 2 (two) times daily.        . Thiamine HCl (VITAMIN B-1) 100 MG tablet Take 100  mg by mouth daily.        . clopidogrel (PLAVIX) 75 MG tablet Take 75 mg by mouth daily.      . diazepam (VALIUM) 5 MG tablet Take 5 mg by mouth daily as needed for anxiety.      . furosemide (LASIX) 40 MG tablet Take 40 mg by mouth daily.      . isosorbide mononitrate (IMDUR) 60 MG 24 hr tablet Take 60 mg by mouth every morning.      Marland Kitchen lisinopril (PRINIVIL,ZESTRIL) 20 MG tablet Take 20 mg by mouth 2 (two) times daily.      . metoprolol (LOPRESSOR) 100 MG tablet Take 50 mg by mouth every morning.      . rosuvastatin (CRESTOR) 20 MG tablet Take 20 mg by mouth daily.       No current facility-administered medications for this  visit.    Allergies  Allergen Reactions  . Cyclobenzaprine Hcl     Unspecified reaction  . Tetracycline     Rash     Patient Active Problem List   Diagnosis Date Noted  . Back pain 07/10/2010    Priority: High  . Hypercholesteremia 07/10/2010    Priority: Medium  . Atrial fibrillation 2020-01-910    Priority: Medium  . Aortic stenosis, moderate 07/07/2012  . Ischemic cardiomyopathy 01-12-12  . Sudden cardiac death-aborted 11-05-2010  . Benign hypertensive heart disease without heart failure 07/10/2010  . S/P CABG (coronary artery bypass graft) 07/10/2010  . ICD-Medtronic 03/30/2010    History  Smoking status  . Former Smoker  . Quit date: 10/25/1952  Smokeless tobacco  . Not on file    History  Alcohol Use No    Family History  Problem Relation Age of Onset  . Coronary artery disease Other     family history is dignificant for early CAD in several members    Review of Systems: Constitutional: no fever chills diaphoresis or fatigue or change in weight.  Head and neck: no hearing loss, no epistaxis, no photophobia or visual disturbance. Respiratory: No cough, shortness of breath or wheezing. Cardiovascular: No chest pain peripheral edema, palpitations.  The patient has had a history of exertional throat pain relieved by rest.  Recently the throat pain has been coming on with less exertion.  Throat pain has always been his anginal equivalent Gastrointestinal: No abdominal distention, no abdominal pain, no change in bowel habits hematochezia or melena. Genitourinary: No dysuria, no frequency, no urgency, no nocturia. Musculoskeletal:No arthralgias, no back pain, no gait disturbance or myalgias. Neurological: No dizziness, no headaches, no numbness, no seizures, no syncope, no weakness, no tremors. Hematologic: No lymphadenopathy, no easy bruising. Psychiatric: No confusion, no hallucinations, no sleep disturbance.    Physical Exam: Filed Vitals:   01/29/14  0827  BP: 108/70  Pulse: 62   the general appearance reveals a well-developed well-nourished gentleman in no distress.The head and neck exam reveals pupils equal and reactive.  Extraocular movements are full.  There is no scleral icterus.  The mouth and pharynx are normal.  The neck is supple.  The carotids reveal no bruits.  The jugular venous pressure is normal.  The  thyroid is not enlarged.  There is no lymphadenopathy.  The chest is clear to percussion and auscultation.  There are no rales or rhonchi.  Expansion of the chest is symmetrical.  Defibrillator in left upper chest. The precordium is quiet.  The first heart sound is normal.  The second heart sound is physiologically  split.  There is grade 2/6 systolic ejection murmur at the aortic area.  There is no abnormal lift or heave.  The abdomen is soft and nontender.  The bowel sounds are normal.  The liver and spleen are not enlarged.  There are no abdominal masses.  There are no abdominal bruits.  Extremities reveal good pedal pulses.  There is no phlebitis or edema.  There is no cyanosis or clubbing.  Strength is normal and symmetrical in all extremities.  There is no lateralizing weakness.  There are no sensory deficits.  The skin is warm and dry.  There is no rash.  EKG shows atrial paced rhythm with right bundle branch block pattern.  Occasional PVCs.   Assessment / Plan: 1.  Ischemic heart disease status post CABG in 1982, status post redo CABG in 1994.  Status post multiple PCI procedures.  Recent increase in exertional throat pain consistent with angina pectoris. 2. history of out of hospital cardiac arrest while playing tennis on 08/28/09 with successful resuscitation.  He has a Medtronic ICD. 3.  Moderate aortic stenosis with peak gradient of 61 and mean gradient 35 by echo 01/12/13 4. left ventricular ejection fraction 55-60% with grade 1 diastolic dysfunction by echo 01/12/13 5. erectile dysfunction, not a candidate for Cialis because he  is on daily nitrates. 6.  Hypercholesterolemia on rosuvastatin 7. strongly positive treadmill Myoview stress test on 01/28/14 showing a large reversible anterior wall defect and showing an ejection fraction of 32%.  Disposition: We will arrange for right and left heart cardiac catheterization for Monday, October 26.  Preoperative lab work and chest x-ray have been ordered.

## 2014-01-29 NOTE — Patient Instructions (Addendum)
Will obtain labs today and call you with the results (bmet/cbc/pt/inr)  A chest x-ray takes a picture of the organs and structures inside the chest, including the heart, lungs, and blood vessels. This test can show several things, including, whether the heart is enlarges; whether fluid is building up in the lungs; and whether pacemaker / defibrillator leads are still in place. [place]   You are scheduled for a cardiac catheterization on Monday 02/01/14 with Dr. Tamala Julian Go to [facility] [place] on 02/01/14 at 5:30  Enter thru the [facility] entrance A No food or drink after midnight on 01/31/14 Only take the following medications with a sip of water on the day of your procedure:  Aspirin  Plavix  Metoprolol  Isosorbide   Follow up with  Dr. Mare Ferrari on 02/08/14

## 2014-02-01 ENCOUNTER — Ambulatory Visit (HOSPITAL_COMMUNITY)
Admission: RE | Admit: 2014-02-01 | Discharge: 2014-02-01 | Disposition: A | Discharge status: Home / Self Care | Payer: Medicare Other | Source: Ambulatory Visit | Attending: Interventional Cardiology | Admitting: Interventional Cardiology

## 2014-02-01 ENCOUNTER — Encounter (HOSPITAL_COMMUNITY)
Admission: RE | Disposition: A | Discharge status: Home / Self Care | Payer: Self-pay | Source: Ambulatory Visit | Attending: Interventional Cardiology

## 2014-02-01 DIAGNOSIS — I350 Nonrheumatic aortic (valve) stenosis: Secondary | ICD-10-CM

## 2014-02-01 DIAGNOSIS — I701 Atherosclerosis of renal artery: Secondary | ICD-10-CM

## 2014-02-01 DIAGNOSIS — Z951 Presence of aortocoronary bypass graft: Secondary | ICD-10-CM | POA: Insufficient documentation

## 2014-02-01 DIAGNOSIS — I25119 Atherosclerotic heart disease of native coronary artery with unspecified angina pectoris: Secondary | ICD-10-CM | POA: Diagnosis not present

## 2014-02-01 DIAGNOSIS — E7800 Pure hypercholesterolemia, unspecified: Secondary | ICD-10-CM

## 2014-02-01 DIAGNOSIS — I259 Chronic ischemic heart disease, unspecified: Secondary | ICD-10-CM

## 2014-02-01 DIAGNOSIS — I4819 Other persistent atrial fibrillation: Secondary | ICD-10-CM

## 2014-02-01 DIAGNOSIS — R9439 Abnormal result of other cardiovascular function study: Secondary | ICD-10-CM

## 2014-02-01 DIAGNOSIS — I25118 Atherosclerotic heart disease of native coronary artery with other forms of angina pectoris: Secondary | ICD-10-CM

## 2014-02-01 DIAGNOSIS — I208 Other forms of angina pectoris: Secondary | ICD-10-CM | POA: Diagnosis present

## 2014-02-01 HISTORY — PX: LEFT AND RIGHT HEART CATHETERIZATION WITH CORONARY/GRAFT ANGIOGRAM: SHX5448

## 2014-02-01 LAB — POCT I-STAT 3, VENOUS BLOOD GAS (G3P V)
Acid-Base Excess: 1 mmol/L (ref 0.0–2.0)
Bicarbonate: 26.5 mEq/L — ABNORMAL HIGH (ref 20.0–24.0)
O2 SAT: 68 %
TCO2: 28 mmol/L (ref 0–100)
pCO2, Ven: 46.3 mmHg (ref 45.0–50.0)
pH, Ven: 7.365 — ABNORMAL HIGH (ref 7.250–7.300)
pO2, Ven: 37 mmHg (ref 30.0–45.0)

## 2014-02-01 LAB — POCT I-STAT 3, ART BLOOD GAS (G3+)
Bicarbonate: 24.5 mEq/L — ABNORMAL HIGH (ref 20.0–24.0)
O2 Saturation: 92 %
PH ART: 7.404 (ref 7.350–7.450)
TCO2: 26 mmol/L (ref 0–100)
pCO2 arterial: 39.3 mmHg (ref 35.0–45.0)
pO2, Arterial: 62 mmHg — ABNORMAL LOW (ref 80.0–100.0)

## 2014-02-01 SURGERY — LEFT AND RIGHT HEART CATHETERIZATION WITH CORONARY/GRAFT ANGIOGRAM
Anesthesia: LOCAL

## 2014-02-01 MED ORDER — OXYCODONE-ACETAMINOPHEN 5-325 MG PO TABS
1.0000 | ORAL_TABLET | ORAL | Status: DC | PRN
Start: 2014-02-01 — End: 2014-02-01

## 2014-02-01 MED ORDER — ACETAMINOPHEN 325 MG PO TABS: 650.0000 mg | ORAL_TABLET | ORAL | Status: DC | PRN

## 2014-02-01 MED ORDER — SODIUM CHLORIDE 0.9 % IV SOLN
INTRAVENOUS | Status: DC
  Administered 2014-02-01: 07:00:00 via INTRAVENOUS

## 2014-02-01 MED ORDER — HEPARIN SODIUM (PORCINE) 1000 UNIT/ML IJ SOLN
INTRAMUSCULAR | Status: AC
  Filled 2014-02-01: qty 1

## 2014-02-01 MED ORDER — FENTANYL CITRATE 0.05 MG/ML IJ SOLN
INTRAMUSCULAR | Status: AC
  Filled 2014-02-01: qty 2

## 2014-02-01 MED ORDER — HEPARIN (PORCINE) IN NACL 2-0.9 UNIT/ML-% IJ SOLN
INTRAMUSCULAR | Status: AC
  Filled 2014-02-01: qty 1000

## 2014-02-01 MED ORDER — LIDOCAINE HCL (PF) 1 % IJ SOLN
INTRAMUSCULAR | Status: AC
  Filled 2014-02-01: qty 30

## 2014-02-01 MED ORDER — ASPIRIN 81 MG PO CHEW
81.0000 mg | CHEWABLE_TABLET | ORAL | Status: DC
Start: 2014-02-02 — End: 2014-02-01

## 2014-02-01 MED ORDER — NITROGLYCERIN 1 MG/10 ML FOR IR/CATH LAB
INTRA_ARTERIAL | Status: AC
  Filled 2014-02-01: qty 10

## 2014-02-01 MED ORDER — SODIUM CHLORIDE 0.9 % IV SOLN: INTRAVENOUS | Status: AC

## 2014-02-01 MED ORDER — SODIUM CHLORIDE 0.9 % IV SOLN: 250.0000 mL | INTRAVENOUS | Status: DC | PRN

## 2014-02-01 MED ORDER — MIDAZOLAM HCL 2 MG/2ML IJ SOLN
INTRAMUSCULAR | Status: AC
  Filled 2014-02-01: qty 2

## 2014-02-01 MED ORDER — ONDANSETRON HCL 4 MG/2ML IJ SOLN: 4.0000 mg | Freq: Four times a day (QID) | INTRAMUSCULAR | Status: DC | PRN

## 2014-02-01 MED ORDER — SODIUM CHLORIDE 0.9 % IJ SOLN: 3.0000 mL | Freq: Two times a day (BID) | INTRAMUSCULAR | Status: DC

## 2014-02-01 MED ORDER — SODIUM CHLORIDE 0.9 % IJ SOLN: 3.0000 mL | INTRAMUSCULAR | Status: DC | PRN

## 2014-02-01 MED ORDER — VERAPAMIL HCL 2.5 MG/ML IV SOLN
INTRAVENOUS | Status: AC
  Filled 2014-02-01: qty 2

## 2014-02-01 NOTE — Discharge Instructions (Signed)

## 2014-02-01 NOTE — Interval H&P Note (Signed)
Cath Lab Visit (complete for each Cath Lab visit)  Clinical Evaluation Leading to the Procedure:   ACS: No.  Non-ACS:    Anginal Classification: CCS III  Anti-ischemic medical therapy: Maximal Therapy (2 or more classes of medications)  Non-Invasive Test Results: High-risk stress test findings: cardiac mortality >3%/year  Prior CABG: Previous CABG      History and Physical Interval Note:  02/01/2014 7:43 AM  [name]  has presented today for surgery, with the diagnosis of c/p  The various methods of treatment have been discussed with the patient and family. After consideration of risks, benefits and other options for treatment, the patient has consented to  Procedure(s): LEFT AND RIGHT HEART CATHETERIZATION WITH CORONARY/GRAFT ANGIOGRAM (N/A) as a surgical intervention .  The patient's history has been reviewed, patient examined, no change in status, stable for surgery.  I have reviewed the patient's chart and labs.  Questions were answered to the patient's satisfaction.     Sinclair Grooms

## 2014-02-01 NOTE — H&P (View-Only) (Signed)
Quick Note:  Please report to patient. The recent labs are stable. Continue same medication and careful diet. Blood tests are stable for cardiac catheterization. ______

## 2014-02-01 NOTE — Progress Notes (Signed)
Site area: RFA/RFV sheaths Site Prior to Removal:  Level 0 Pressure Applied For:15 min Manual:    Patient Status During Pull:  stable Post Pull Site:  Level Post Pull Instructions Given:   Post Pull Pulses Present: palpable Dressing Applied:  clear Bedrest begins @ 7639 Comments:no complications

## 2014-02-01 NOTE — CV Procedure (Signed)
Left Heart Catheterization with Coronary Angiography , Bypass Angio, and Abdominal Angiogram Report  [name]  80 y.o.  male Aug 29, 1933  Procedure Date: 02/01/2014 Referring Physician: Darlin Coco, M.D. Primary Cardiologist: Darlin Coco, M.D.  INDICATIONS: Progressive angina and severe aortic stenosis  PROCEDURE: 1. Left heart catheterization; 2. Left ventriculography; 3. Coronary angiography; 4. Bypass graft angiography; 5. Left internal mammary graft angiography; 6. Abdominal aortography  CONSENT:  The risks, benefits, and details of the procedure were explained in detail to the patient. Risks including death, stroke, heart attack, kidney injury, allergy, limb ischemia, bleeding and radiation injury were discussed.  The patient verbalized understanding and wanted to proceed.  Informed written consent was obtained.  PROCEDURE TECHNIQUE:  After Xylocaine anesthesia a 5 French sheath was placed in the right femoral artery using the modified Seldinger technique. A 7 French sheath was placed in the right femoral vein using the Seldinger technique. 2% Xylocaine was used for local anesthesia. Coronary angiography was done using a 5 F a to MP, JR4, JL 3.5 cm, and Internal Mammary diagnostic catheters.  Left ventriculography was done using the JR4 catheter and hand injection. Abdominal aortography was performed using an angled pigtail catheter and power injection at 18 cc/s for a total of 40 cc of contrast.  Digital images were reviewed. Hemodynamics were reviewed. The case was terminated. Manual compression was used for hemostasis.   CONTRAST:  Total of 139 cc.  COMPLICATIONS:  None   HEMODYNAMICS:  Aortic pressure 112/54 mmHg; LV pressure 143 over 9 mmHg; LVEDP 16 mmHg; RA 5 mmHg; RV 24 over 8 mmHg; PA 26 over 13 mmHg; PCWP(mean) 10 mmHg; Cardiac Output Fick 4.97 L/m; thermodilution 3.62 L/m; AV gradient 31 mmHg peak to peak and 26 mmHg mean gradients; aortic valve area  using Fick cardiac output 1.06 cm; aortic valve area using the thermodilution cardiac output 0.77 cm.  ANGIOGRAPHIC DATA:   The left main coronary artery is heavily calcified and totally occluded.  The left anterior descending artery is totally occluded and supplied only by the left internal mammary.  The left circumflex artery is totally occluded.  The right coronary artery is totally occluded at the ostium.  BYPASS GRAFT ANGIOGRAPHY: Saphenous vein graft to the obtuse marginal: Widely patent with luminal irregularities. A proximal/ostial stent and a mid body stent is widely patent.  Saphenous vein graft to the right coronary: contains luminal irregularities but no significant obstruction is noted  Internal mammary graft to the LAD: Widely patent. The native left anterior descending is severely and diffusely diseased. There is 90% stenosis antegrade and also 90% stenosis retrograde. The apical LAD is also severely diseased. Collaterals to the right ventricular branch of the RCA arise from the LAD.  ABDOMINAL AORTOGRAPHY: Demonstrated 70% right renal artery stenosis. 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 There is 40-50% ostial left renal artery stenosis. The left internal iliac contains 90% stenosis. The right internal iliac is also 50% narrowing.   LEFT VENTRICULOGRAM:  Left ventricular angiogram was done in the 30 RAO projection and revealed a normal left ventricular cavity size with an estimated ejection fraction of 35-40%. No obvious mitral regurgitation was noted.   IMPRESSIONS:  1. Patent bypass grafts, very similar to the last heart catheterization in 2011. The grafts include a saphenous vein graft to the right coronary, saphenous vein graft to the obtuse marginal. The left internal mammary graft to the LAD is widely patent. 2. The left anterior descending is severely and  diffusely diseased, both antegrade and retrograde from the graft insertion site. This is likely the  source of the patient's anginal complaints. No focal sites for intervention exist. 3. Severe aortic stenosis, calcific 4. Left ventricular systolic dysfunction with ejection fraction 35-40%. No obvious mitral regurgitation is noted.   RECOMMENDATION:  Preferred a heart valve clinic for consideration of TAVR.Marland Kitchen

## 2014-02-01 NOTE — Progress Notes (Signed)
Up and walked and tolerated well; right groin stable no bleeding or hematoma 

## 2014-02-05 ENCOUNTER — Other Ambulatory Visit: Payer: Self-pay | Admitting: Cardiology

## 2014-02-08 ENCOUNTER — Encounter: Payer: Self-pay | Admitting: Cardiology

## 2014-02-08 ENCOUNTER — Ambulatory Visit (INDEPENDENT_AMBULATORY_CARE_PROVIDER_SITE_OTHER): Payer: Medicare Other | Admitting: Cardiology

## 2014-02-08 VITALS — BP 120/64 | HR 72 | Ht 66.0 in | Wt 156.0 lb

## 2014-02-08 DIAGNOSIS — Z951 Presence of aortocoronary bypass graft: Secondary | ICD-10-CM | POA: Diagnosis not present

## 2014-02-08 DIAGNOSIS — I255 Ischemic cardiomyopathy: Secondary | ICD-10-CM

## 2014-02-08 DIAGNOSIS — I350 Nonrheumatic aortic (valve) stenosis: Secondary | ICD-10-CM | POA: Diagnosis not present

## 2014-02-08 DIAGNOSIS — I259 Chronic ischemic heart disease, unspecified: Secondary | ICD-10-CM | POA: Diagnosis not present

## 2014-02-08 DIAGNOSIS — I119 Hypertensive heart disease without heart failure: Secondary | ICD-10-CM

## 2014-02-08 NOTE — Assessment & Plan Note (Signed)
Blood pressure is remaining stable on current medication.  Patient is not having any dizzy spells or syncope.  No symptoms of CHF.

## 2014-02-08 NOTE — Assessment & Plan Note (Signed)
The patient has moderate to severe aortic stenosis.  His gradient may be underestimated because of his decreased left ventricular systolic function.  He will have a consultation with Dr. Cyndia Bent later this week regarding aortic valve replacement/TAVR.

## 2014-02-08 NOTE — Progress Notes (Signed)
[name] Date of Birth:  04-23-1933 [facility] [address] [place], [place]  [address] [phone]        Fax   [phone]   History of Present Illness: This pleasant 80 year old gentleman is seen for a posthospital office visit. He has a past history of ischemic heart disease. He had coronary artery bypass graft surgery in 1982. He had a redo coronary artery bypass graft surgery in 1994. He has had multiple PCI procedures by Dr. Peter Martinique. He had an out of hospital cardiac arrest while playing tennis on 08/28/09 and he was resuscitated and has had a good functional recovery. His last cardiac catheterization on 09/06/09 showed no lesions amenable to catheter-based intervention. He does have a Medtronic ICD. He has been maintaining normal sinus rhythm since successful cardioversion from previous atrial fib. He has a history of aortic stenosis. His most recent echocardiogram on 01/12/13 showed normal left ventricular systolic function with ejection fraction 55-60%. There is grade 1 diastolic dysfunction. There is moderate aortic stenosis with peak gradient 61 and mean gradient 35.  The patient has a history of erectile dysfunction.  He is not a candidate for Cialis because he is on daily nitrates. 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 The patient had a treadmill Myoview stress test on 01/28/14 which is markedly abnormal.  The stress test revealed High risk stress nuclear study with a large sized, severe in intensity reversible defect involving the entire anterior wall, apex and mid an basal anteroseptum and anterolateral wall. There is also evidence of ischemic dilatation.   LV Ejection Fraction: 32%. LV Wall Motion: Moderately to severely reduced LVF with septal dyskinesis secondary to RBBB and diffuse hypokinesis. As noted, the patient has  moderate to severe aortic stenosis as well as significant ischemic heart disease. The patient underwent right and left heart cardiac catheterization on 02/01/14 with the following results:  IMPRESSIONS: 1. Patent bypass grafts, very similar to the last heart catheterization in 2011. The grafts include a saphenous vein graft to the right coronary, saphenous vein graft to the obtuse marginal. The left internal mammary graft to the LAD is widely patent. 2. The left anterior descending is severely and diffusely diseased, both antegrade and retrograde from the graft insertion site. This is likely the source of the patient's anginal complaints. No focal sites for intervention exist. 3. Severe aortic stenosis, calcific 4. Left ventricular systolic dysfunction with ejection fraction 35-40%. No obvious mitral regurgitation is noted.  Post cardiac catheterization the patient has done well.  He has not had any acceleration of his stable exertional throat pain.  He has an appointment to see Dr. Cyndia Bent on November 4 regarding aortic valve replacement and possible TAVR candidate.    Current Outpatient Prescriptions  Medication Sig Dispense Refill  . amLODipine (NORVASC) 5 MG tablet Take 1 tablet (5 mg total) by mouth every evening. 90 tablet 2  . aspirin 81 MG tablet Take 81 mg by mouth daily.      . clopidogrel (PLAVIX) 75 MG tablet Take 75 mg by mouth daily.    . Coenzyme Q10 (CO Q 10 PO) Take 1 tablet by mouth daily.     . diazepam (VALIUM) 5 MG tablet Take 5 mg by mouth daily as needed for anxiety.    Marland Kitchen FLUZONE HIGH-DOSE 0.5 ML SUSY   0  . furosemide (LASIX) 40 MG tablet  Take 40 mg by mouth daily.    . isosorbide mononitrate (IMDUR) 60 MG 24 hr tablet Take 60 mg by mouth every morning.    Marland Kitchen KRILL OIL PO Take 1 tablet by mouth daily.    Marland Kitchen latanoprost (XALATAN) 0.005 % ophthalmic solution Place 1 drop into both eyes at bedtime.     Marland Kitchen lisinopril (PRINIVIL,ZESTRIL) 20 MG tablet Take 20 mg by mouth 2  (two) times daily.    . Magnesium Oxide (MAG-OXIDE PO) Take 1 tablet by mouth daily.    . metoprolol (LOPRESSOR) 100 MG tablet Take 50 mg by mouth every morning.    . metoprolol (LOPRESSOR) 50 MG tablet Take 25 mg tablet in the evening    . Multiple Vitamin (MULTIVITAMIN) tablet Take 1 tablet by mouth daily.      . niacin (SLO-NIACIN) 500 MG tablet Take 500 mg by mouth at bedtime.      . nitroGLYCERIN (NITROSTAT) 0.4 MG SL tablet Place 1 tablet (0.4 mg total) under the tongue every 5 (five) minutes as needed. 25 tablet 1  . potassium chloride (K-DUR,KLOR-CON) 10 MEQ tablet Take 10 mEq by mouth 2 (two) times daily.      Marland Kitchen PREVNAR 13 SUSP injection   0  . rosuvastatin (CRESTOR) 20 MG tablet Take 20 mg by mouth daily.    . Thiamine HCl (VITAMIN B-1) 100 MG tablet Take 100 mg by mouth daily.       No current facility-administered medications for this visit.    Allergies  Allergen Reactions  . Cyclobenzaprine Hcl     Unspecified reaction  . Tetracycline     Rash     Patient Active Problem List   Diagnosis Date Noted  . Back pain 07/10/2010    Priority: High  . Hypercholesteremia 07/10/2010    Priority: Medium  . Atrial fibrillation 11/24/202011    Priority: Medium  . Abnormal nuclear stress test 01/29/2014  . Aortic stenosis, moderate 07/07/2012  . Ischemic cardiomyopathy 07-Jan-2012  . Sudden cardiac death-aborted 2010-10-31  . Benign hypertensive heart disease without heart failure 07/10/2010  . S/P CABG (coronary artery bypass graft) 07/10/2010  . ICD-Medtronic 03/30/2010    History  Smoking status  . Former Smoker  . Quit date: 10/25/1952  Smokeless tobacco  . Not on file    History  Alcohol Use No    Family History  Problem Relation Age of Onset  . Coronary artery disease Other     family history is dignificant for early CAD in several members    Review of Systems: Constitutional: no fever chills diaphoresis or fatigue or change in weight.  Head and neck: no  hearing loss, no epistaxis, no photophobia or visual disturbance. Respiratory: No cough, shortness of breath or wheezing. Cardiovascular: No chest pain peripheral edema, palpitations.  The patient has had a history of exertional throat pain relieved by rest.  Recently the throat pain has been coming on with less exertion.  Throat pain has always been his anginal equivalent Gastrointestinal: No abdominal distention, no abdominal pain, no change in bowel habits hematochezia or melena. Genitourinary: No dysuria, no frequency, no urgency, no nocturia. Musculoskeletal:No arthralgias, no back pain, no gait disturbance or myalgias. Neurological: No dizziness, no headaches, no numbness, no seizures, no syncope, no weakness, no tremors. Hematologic: No lymphadenopathy, no easy bruising. Psychiatric: No confusion, no hallucinations, no sleep disturbance.    Physical Exam: Filed Vitals:   02/08/14 1143  BP: 120/64  Pulse: 72   the general  appearance reveals a well-developed well-nourished gentleman in no distress.The head and neck exam reveals pupils equal and reactive.  Extraocular movements are full.  There is no scleral icterus.  The mouth and pharynx are normal.  The neck is supple.  The carotids reveal no bruits.  The jugular venous pressure is normal.  The  thyroid is not enlarged.  There is no lymphadenopathy.  The chest is clear to percussion and auscultation.  There are no rales or rhonchi.  Expansion of the chest is symmetrical.  Defibrillator in left upper chest. The precordium is quiet.  The first heart sound is normal.  The second heart sound is physiologically split.  There is grade 2/6 systolic ejection murmur at the aortic area.  There is no abnormal lift or heave.  The abdomen is soft and nontender.  The bowel sounds are normal.  The liver and spleen are not enlarged.  There are no abdominal masses.  There are no abdominal bruits.  Extremities reveal good pedal pulses.  There is no phlebitis  or edema.  There is no cyanosis or clubbing.  Strength is normal and symmetrical in all extremities.  There is no lateralizing weakness.  There are no sensory deficits.  The skin is warm and dry.  There is no rash.  EKG shows atrial paced rhythm with right bundle branch block pattern.  Occasional PVCs.   Assessment / Plan: 1.  Ischemic heart disease status post CABG in 1982, status post redo CABG in 1994.  Status post multiple PCI procedures.  Recent increase in exertional throat pain consistent with angina pectoris. 2. history of out of hospital cardiac arrest while playing tennis on 08/28/09 with successful resuscitation.  He has a Medtronic ICD. 3.  Moderate aortic stenosis with peak gradient of 61 and mean gradient 35 by echo 01/12/13 4. left ventricular ejection fraction 55-60% with grade 1 diastolic dysfunction by echo 01/12/13. Ejection fraction 35-40% at cardiac catheterization with no obvious mitral regurgitation noted 5. erectile dysfunction, not a candidate for Cialis because he is on daily nitrates. 6.  Hypercholesterolemia on rosuvastatin 7. strongly positive treadmill Myoview stress test on 01/28/14 showing a large reversible anterior wall defect and showing an ejection fraction of 32%. Cardiac catheterization on 02/01/14 by Dr. Daneen Schick shows patent bypass grafts very similar to the last heart catheterization in 2012.  The left internal mammary graft to the LAD is widely patent.  The LAD itself is severely and diffusely diseased both antegrade and retrograde from the graft insertion site and this is felt likely the source of the patient's exertional angina.  No focal sites for intervention exist.  Disposition: Proceed with evaluation for TAVR.patient has appointment to see Dr. Cyndia Bent on 02/10/14  Continue current medication and be rechecked here in 3 months for office visit and EKG, or sooner when necessary

## 2014-02-08 NOTE — Assessment & Plan Note (Signed)
The patient has predictable exertional throat pain relieved by rest.  He had an episode while climbing stairs in a theater or stadium recently.  It subsided when he sat down and rested.  He has not taken any sublingual nitroglycerin because the discomfort always disappears predictably with rest

## 2014-02-08 NOTE — Patient Instructions (Signed)
Your physician recommends that you continue on your current medications as directed. Please refer to the Current Medication list given to you today.  Your physician recommends that you schedule a follow-up appointment in: 3 month ov/ekg 

## 2014-02-10 ENCOUNTER — Institutional Professional Consult (permissible substitution) (INDEPENDENT_AMBULATORY_CARE_PROVIDER_SITE_OTHER): Payer: Medicare Other | Admitting: Surgery

## 2014-02-10 ENCOUNTER — Encounter: Payer: Self-pay | Admitting: Surgery

## 2014-02-10 VITALS — BP 136/80 | HR 80 | Ht 66.0 in | Wt 151.0 lb

## 2014-02-10 DIAGNOSIS — I350 Nonrheumatic aortic (valve) stenosis: Secondary | ICD-10-CM

## 2014-02-11 ENCOUNTER — Encounter: Payer: Self-pay | Admitting: Surgery

## 2014-02-11 ENCOUNTER — Other Ambulatory Visit: Payer: Self-pay | Admitting: *Deleted

## 2014-02-11 DIAGNOSIS — I350 Nonrheumatic aortic (valve) stenosis: Secondary | ICD-10-CM

## 2014-02-11 NOTE — Progress Notes (Signed)
Patient ID: [name], male   DOB: 1933-09-02, 80 y.o.   MRN: [id number]   [facility] REPORT  Referring Provider is Sinclair Grooms, MD PCP is Darlin Coco, MD  Chief Complaint  Patient presents with  . NEW CARDIAC    NEW TAVR EVAL    HPI:  The patient is a 80 year old gentleman with a history of coronary disease who underwent CABG in 1982 in [place] and redo CABG in 1994 by Dr. Redmond Pulling. He has had multiple PCI's by Dr. Martinique. He had a cardiac arrest on 08/28/09 while playing tennis and was successfully resuscitated with cooling and had a good functional recovery. He had a cath on 09/06/2009 that showed no lesions amenable to PCI and had an ICD placed ( Medtronic). He had recurrent PMVT in February 2015 while playing golf and says he was shocked twice but did not report it and this was just recently found in his ICD record. He has a known history of aortic stenosis and his last echo was on 01/12/2013 showing moderate aortic stenosis with all three leaflets moderately calcified and restricted leaflet mobility. The mean gradient was 35 mm Hg and the peak was 61 mm Hg. The AVA was 1.57 cm2 by Vmax and 1.51 by VTI. LVEF was 55-60%. He was seen last month by Dr. Mare Ferrari complaining of exertional tightness and pain in his throat which has always been his anginal symptom. He says this has been present for a long time and occuring with less exertion. He had a high risk Myoview on 01/28/2014 with the same symptom developing during the test with a large, reversible defect involving the entire anterior wall, apex, mid and basal anteroseptum and anterolateral wall with evidence of ischemic dilation. The EF was 32% with diffuse hypokinesis and septal dyskinesis. He underwent cardiac cath on 02/01/2014 which showed that his left main, LAD, LCX, and RCA are all occluded with patent vein grafts and a patent  LIMA to the LAD. The LAD is severely and diffusely diseased both proximal and distal to the graft insertion. The is nothing to intervene on. The EF is 35-40%. The peak to peak aortic valve gradient was 31 mm Hg and the mean was 26 mm Hg with an AVA by Fick of 1.06 cm2 and by thermodilution 0.77 cm2.  He says that he gets the throat tightness with significant exertion like going up stairs or hills, walking the golf course, lifting heavy thinks. He has been avoiding doing things that bring on the tightness but would like to be more active.   Past Medical History  Diagnosis Date  . Cardiac arrest     aborted, atrial fibrillation/flutter  . Presence of automatic cardioverter/defibrillator (AICD)     Medtronic, remote-yes.   . Coronary artery disease   . Aortic stenosis   . Peripheral vascular disease   . Essential hypertension   . Hyperlipidemia   . Angina pectoris      Recurrent angina pectoris  . Claudication     Past Surgical History  Procedure Laterality Date  . Cabg      in 1982 and '84 with subsequent PTCI  . Appendectomy    . Tonsillectomy    . Adenoidectomy    . Cataract surgery       Bilateral cataract surgery  . Left elbow tendon surgery    . Cardiac catheterization   11/16/2009  Family History  Problem Relation Age of Onset  . Coronary artery disease Other     family history is dignificant for early CAD in several members    History   Social History  . Marital Status: Married    Spouse Name: N/A    Number of Children: N/A  . Years of Education: N/A   Occupational History  . Not on file.   Social History Main Topics  . Smoking status: Former Smoker    Quit date: 10/25/1952  . Smokeless tobacco: Not on file  . Alcohol Use: No  . Drug Use: No  . Sexual Activity: Not on file   Other Topics Concern  . Not on file   Social History Narrative    Current Outpatient Prescriptions  Medication Sig Dispense Refill  . amLODipine (NORVASC) 5 MG tablet  TAKE 1 TABLET BY MOUTH EVERY EVENING. 90 tablet 2  . aspirin 81 MG tablet Take 81 mg by mouth daily.      . clopidogrel (PLAVIX) 75 MG tablet Take 75 mg by mouth daily.    . Coenzyme Q10 (CO Q 10 PO) Take 1 tablet by mouth daily.     Marland Kitchen FLUZONE HIGH-DOSE 0.5 ML SUSY   0  . furosemide (LASIX) 40 MG tablet Take 40 mg by mouth daily.    . isosorbide mononitrate (IMDUR) 60 MG 24 hr tablet Take 60 mg by mouth every morning.    Marland Kitchen KRILL OIL PO Take 1 tablet by mouth daily.    Marland Kitchen latanoprost (XALATAN) 0.005 % ophthalmic solution Place 1 drop into both eyes at bedtime.     Marland Kitchen lisinopril (PRINIVIL,ZESTRIL) 20 MG tablet Take 20 mg by mouth 2 (two) times daily.    . Magnesium Oxide (MAG-OXIDE PO) Take 1 tablet by mouth daily.    . metoprolol (LOPRESSOR) 100 MG tablet Take 50 mg by mouth every morning.    . metoprolol (LOPRESSOR) 50 MG tablet Take 25 mg tablet in the evening    . Multiple Vitamin (MULTIVITAMIN) tablet Take 1 tablet by mouth daily.      . niacin (SLO-NIACIN) 500 MG tablet Take 500 mg by mouth at bedtime.      . potassium chloride (K-DUR,KLOR-CON) 10 MEQ tablet Take 10 mEq by mouth 2 (two) times daily.      Marland Kitchen PREVNAR 13 SUSP injection   0  . rosuvastatin (CRESTOR) 20 MG tablet Take 20 mg by mouth daily.    . Thiamine HCl (VITAMIN B-1) 100 MG tablet Take 100 mg by mouth daily.      . diazepam (VALIUM) 5 MG tablet Take 5 mg by mouth daily as needed for anxiety.    . nitroGLYCERIN (NITROSTAT) 0.4 MG SL tablet Place 1 tablet (0.4 mg total) under the tongue every 5 (five) minutes as needed. 25 tablet 1   No current facility-administered medications for this visit.    Allergies  Allergen Reactions  . Cyclobenzaprine Hcl     Unspecified reaction  . Tetracycline     Rash       Review of Systems:   General:  normal appetite, decreased energy, no weight gain, no weight loss, no fever  Cardiac:   has throat tightness with exertion, no chest pain at rest, noSOB with  exertion, no resting  SOB, no PND, no orthopnea, no palpitations, no arrhythmia, history of atrial fibrillation and flutter, no LE edema, no dizzy spells, no syncope  Respiratory:  no shortness of breath, no home oxygen, no  productive cough, no dry cough, no bronchitis, no wheezing, no hemoptysis, no asthma, no pain with inspiration or cough, no sleep apnea, no CPAP at night  GI:   no difficulty swallowing, no reflux, no frequent heartburn, no hiatal hernia, no abdominal pain, no constipation, no diarrhea, no hematochezia, no hematemesis, no melena  GU:   no dysuria,  has frequency, no urinary tract infection, no hematuria, no enlarged prostate, no kidney stones, no kidney disease  Vascular:  no pain suggestive of claudication, no pain in feet, no leg cramps, no varicose veins, no DVT, no non-healing foot ulcer  Neuro:   no stroke, no TIA's, no seizures, no headaches, notemporary blindness one eye,  no slurred speech, no peripheral neuropathy, no chronic pain, no instability of gait, no memory/cognitive dysfunction  Musculoskeletal: has arthritis, no joint swelling, no myalgias, no difficulty walking, no mobility   Skin:   no rash, no itching, no skin infections, no pressure sores or ulcerations  Psych:   no anxiety, no depression, no nervousness, no unusual recent stress  Eyes:   Left eye blurry vision, no floaters, no recent vision changes,  wears glasses  ENT:   Left ear hearing loss, no loose or painful teeth, no dentures, last saw dentist a few months ago  Hematologic:  has easy bruising, no abnormal bleeding, no clotting disorder, no frequent epistaxis  Endocrine:  no diabetes, does not check CBG's at home           Physical Exam:   BP 136/80 mmHg  Pulse 80  Ht 5\' 6"  (1.676 m)  Wt 151 lb (68.493 kg)  BMI 24.38 kg/m2  SpO2 97%  General:             well-appearing gentleman in no distress  HEENT:  Unremarkable, NCAT, PERLA, EOMI, throat clear.  Neck:   no JVD, bruit or transmitted murmur to left neck, no  adenopathy or thyromegaly.  Chest:   clear to auscultation, symmetrical breath sounds, no wheezes, no rhonchi   CV:   RRR, grade III/VI crescendo/decrescendo murmur heard best at RSB,  no diastolic murmur  Abdomen:  soft, non-tender, no masses or organomegaly  Extremities:  warm, well-perfused, pulses palpable bilat, no LE edema, scars both legs from vein harvests  Rectal/GU  Deferred  Neuro:   Grossly non-focal and symmetrical throughout  Skin:   Clean and dry, no rashes, no breakdown   Diagnostic Tests:  [facility]          [address], [place] [address]            [phone]  ------------------------------------------------------------ Transthoracic Echocardiography  Patient:  Elba, Dendinger MR #:    [id number] Study Date: 01/12/2013 Gender:   M Age:    39 Height:   167.6cm Weight:   68kg BSA:    1.23m^2 Pt. Status: Room:  ATTENDING  [place], [place], [facility] SONOGRAPHER Victorio Palm, RDCS ORDERING   Virl Axe Md REFERRING  Virl Axe Md cc:  ------------------------------------------------------------ LV EF: 55% -  60%  ------------------------------------------------------------ Indications:   Aortic stenosis /insufficiency 424.1. Cardiomyopathy - ischemic 414.8.  ------------------------------------------------------------ History:  PMH: PVD. Acquired from the patient and from the patient's chart. 3/6 Systolic murmur at left sternal border. Atrial fibrillation. Ischemic cardiomyopathy, with an ejection fraction of 60%by echocardiography. The dysfunction is primarily systolic. Borderline significant aortic stenosis. PMH:  Sudden death/cardiac arrest episode. Risk factors:  Hypertension.  ------------------------------------------------------------ Study Conclusions  - Left ventricle: The cavity size was normal. Wall  thickness was normal. Systolic function was normal. The estimated ejection fraction was in the range of 55% to 60%. Wall motion was normal; there were no regional wall motion abnormalities. Doppler parameters are consistent with abnormal left ventricular relaxation (grade 1 diastolic dysfunction). - Aortic valve: Valve mobility was restricted. There was moderate stenosis. Trivial regurgitation. Mean gradient: 106mm Hg (S). Peak gradient: 54mm Hg (S). - Mitral valve: Calcified annulus. - Left atrium: The atrium was moderately dilated. - Right ventricle: The cavity size was mildly dilated. - Right atrium: The atrium was mildly dilated. - Pulmonary arteries: Systolic pressure was mildly increased. PA peak pressure: 75mm Hg (S).  ------------------------------------------------------------ Labs, prior tests, procedures, and surgery: Echocardiography (October 2013).  The aortic valve showed moderate to severe stenosis. EF was 60%. Aortic valve: peak gradient of 64mm Hg and mean gradient of 71mm Hg.  ICD system implantation.  Currently implanted device: implantable cardioverter defibrillator. Coronary artery bypass grafting. Coronary artery bypass grafting. Transthoracic echocardiography. M-mode, complete 2D, spectral Doppler, and color Doppler. Height: Height: 167.6cm. Height: 66in. Weight: Weight: 68kg. Weight: 149.7lb. Body mass index: BMI: 24.2kg/m^2. Body surface area:  BSA: 1.67m^2. Blood pressure:   103/61. Patient status: Outpatient. Location: [facility]  ------------------------------------------------------------  ------------------------------------------------------------ Left ventricle: The cavity size was normal. Wall thickness was normal. Systolic function was normal. The estimated ejection fraction was in the range of 55% to 60%. Wall motion was normal; there were no regional wall motion abnormalities. Doppler  parameters are consistent with abnormal left ventricular relaxation (grade 1 diastolic dysfunction).  ------------------------------------------------------------ Aortic valve:  Trileaflet; moderately calcified leaflets. Valve mobility was restricted. Doppler:  There was moderate stenosis.  Trivial regurgitation.  VTI ratio of LVOT to aortic valve: 0.28. Valve area: 1.51cm^2(VTI). Indexed valve area: 0.85cm^2/m^2 (VTI). Peak velocity ratio of LVOT to aortic valve: 0.29. Valve area: 1.57cm^2 (Vmax). Indexed valve area: 0.88cm^2/m^2 (Vmax).  Mean gradient: 8mm Hg (S). Peak gradient: 72mm Hg (S).  ------------------------------------------------------------ Aorta: Aortic root: The aortic root was normal in size.  ------------------------------------------------------------ Mitral valve:  Calcified annulus. Mobility was not restricted. Doppler: Transvalvular velocity was within the normal range. There was no evidence for stenosis. Trivial regurgitation.  Peak gradient: 17mm Hg (D).  ------------------------------------------------------------ Left atrium: The atrium was moderately dilated.  ------------------------------------------------------------ Right ventricle: The cavity size was mildly dilated. Pacer wire or catheter noted in right ventricle. Systolic function was normal.  ------------------------------------------------------------ Pulmonic valve:  Doppler: Transvalvular velocity was within the normal range. There was no evidence for stenosis.  ------------------------------------------------------------ Tricuspid valve:  Structurally normal valve.  Doppler: Transvalvular velocity was within the normal range. Trivial regurgitation.  ------------------------------------------------------------ Pulmonary artery:  Systolic pressure was mildly increased.  ------------------------------------------------------------ Right atrium: The atrium was  mildly dilated. Pacer wire or catheter noted in right atrium.  ------------------------------------------------------------ Pericardium: There was no pericardial effusion.  ------------------------------------------------------------ Systemic veins: Inferior vena cava: The vessel was normal in size.  ------------------------------------------------------------  2D measurements    Normal Doppler measurements  Normal Left ventricle         Main pulmonary LVID ED,  52.3 mm   43-52  artery chord,             Pressure,  32 mm Hg =30 PLAX              S LVID ES,  40.3 mm   23-38  Left ventricle chord,  Ea, lat  8.44 cm/s  ------ PLAX              ann, tiss FS, chord,  23 %   >29   DP PLAX              E/Ea, lat 9.64    ------ LVPW, ED  10.5 mm   ------ ann, tiss IVS/LVPW  1.01    <1.3  DP ratio, ED           Ea, med   5.7 cm/s  ------ Ventricular septum       ann, tiss IVS, ED  10.6 mm   ------ DP LVOT              E/Ea, med 14.2    ------ Diam, S   26 mm   ------ ann, tiss   8 Area    5.31 cm^2  ------ DP Diam     26 mm   ------ LVOT Aorta             Peak vel,  115 cm/s  ------ Root diam,  31 mm   ------ S ED               VTI, S   28.4 cm   ------ AAo AP    34 mm   ------ Peak     5 mm Hg ------ diam, S            gradient, Left atrium          S AP dim    48 mm   ------ Stroke vol 150. ml   ------ AP dim   2.71 cm/m^2 <2.2         8 index             Stroke   85.2 ml/m^2 ------                index                Aortic valve                Peak vel,  390 cm/s  ------                S                Mean  vel,  277 cm/s  ------                S                VTI, S   100 cm   ------                Mean     35 mm Hg ------                gradient,                S                Peak     61 mm Hg ------                gradient,                S                VTI ratio 0.28    ------  LVOT/AV                Area, VTI 1.51 cm^2  ------                Area index 0.85 cm^2/m ------                (VTI)      ^2                Peak vel  0.29    ------                ratio,                LVOT/AV                Area, Vmax 1.57 cm^2  ------                Area index 0.88 cm^2/m ------                (Vmax)     ^2                Mitral valve                Peak E vel 81.4 cm/s  ------                Peak A vel 81.9 cm/s  ------                Decelerati 183 ms   150-23                on time        0                Peak     3 mm Hg ------                gradient,                D                Peak E/A   1    ------                ratio                Tricuspid valve                Regurg   259 cm/s  ------                peak vel                Peak RV-RA  27 mm Hg ------                gradient,                S                Systemic veins                Estimated   5 mm Hg ------                 CVP                Right ventricle                Pressure,  32 mm Hg <30  S                Sa vel,  12.1 cm/s  ------                lat ann,                tiss DP  ------------------------------------------------------------ Prepared and Electronically Authenticated by  Kirk Ruths 2014-10-06T12:48:12.800    Left Heart Catheterization with Coronary Angiography , Bypass Angio, and Abdominal Angiogram Report  AXL RODINO  80 y.o.  male [birth year]  Procedure Date: 02/01/2014 Referring Physician: Darlin Coco, M.D. Primary Cardiologist: Darlin Coco, M.D.  INDICATIONS: Progressive angina and severe aortic stenosis  PROCEDURE: 1. Left heart catheterization; 2. Left ventriculography; 3. Coronary angiography; 4. Bypass graft angiography; 5. Left internal mammary graft angiography; 6. Abdominal aortography  CONSENT:  The risks, benefits, and details of the procedure were explained in detail to the patient. Risks including death, stroke, heart attack, kidney injury, allergy, limb ischemia, bleeding and radiation injury were discussed. The patient verbalized understanding and wanted to proceed. Informed written consent was obtained.  PROCEDURE TECHNIQUE: After Xylocaine anesthesia a 5 French sheath was placed in the right femoral artery using the modified Seldinger technique. A 7 French sheath was placed in the right femoral vein using the Seldinger technique. 2% Xylocaine was used for local anesthesia. Coronary angiography was done using a 5 F a to MP, JR4, JL 3.5 cm, and Internal Mammary diagnostic catheters. Left ventriculography was done using the JR4 catheter and hand injection. Abdominal aortography was performed using an angled pigtail catheter and power injection at 18 cc/s for a total of 40 cc of contrast.  Digital images were reviewed.  Hemodynamics were reviewed. The case was terminated. Manual compression was used for hemostasis.  CONTRAST: Total of 139 cc.  COMPLICATIONS: None   HEMODYNAMICS: Aortic pressure 112/54 mmHg; LV pressure 143 over 9 mmHg; LVEDP 16 mmHg; RA 5 mmHg; RV 24 over 8 mmHg; PA 26 over 13 mmHg; PCWP(mean) 10 mmHg; Cardiac Output Fick 4.97 L/m; thermodilution 3.62 L/m; AV gradient 31 mmHg peak to peak and 26 mmHg mean gradients; aortic valve area using Fick cardiac output 1.06 cm; aortic valve area using the thermodilution cardiac output 0.77 cm.  ANGIOGRAPHIC DATA: The left main coronary artery is heavily calcified and totally occluded.  The left anterior descending artery is totally occluded and supplied only by the left internal mammary.  The left circumflex artery is totally occluded.  The right coronary artery is totally occluded at the ostium.  BYPASS GRAFT ANGIOGRAPHY: Saphenous vein graft to the obtuse marginal: Widely patent with luminal irregularities. A proximal/ostial stent and a mid body stent is widely patent.  Saphenous vein graft to the right coronary: contains luminal irregularities but no significant obstruction is noted  Internal mammary graft to the LAD: Widely patent. The native left anterior descending is severely and diffusely diseased. There is 90% stenosis antegrade and also 90% stenosis retrograde. The apical LAD is also severely diseased. Collaterals to the right ventricular branch of the RCA arise from the LAD.  ABDOMINAL AORTOGRAPHY: Demonstrated 70% right renal artery stenosis. 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 There is 40-50% ostial left renal artery stenosis. The left internal iliac contains 90% stenosis. The right internal iliac is also 50% narrowing.   LEFT VENTRICULOGRAM: Left ventricular angiogram was done in the 30 RAO projection and revealed a normal left ventricular cavity size with an estimated ejection fraction of 35-40%. No obvious  mitral regurgitation was noted.   IMPRESSIONS: 1. Patent bypass grafts, very similar to the last heart catheterization in 2011. The grafts include a saphenous vein graft to the right coronary, saphenous vein graft to the obtuse marginal. The left internal mammary graft to the LAD is widely patent. 2. The left anterior descending is severely and diffusely diseased, both antegrade and retrograde from the graft insertion site. This is likely the source of the patient's anginal complaints. No focal sites for intervention exist. 3. Severe aortic stenosis, calcific 4. Left ventricular systolic dysfunction with ejection fraction 35-40%. No obvious mitral regurgitation is noted.   RECOMMENDATION: Preferred a heart valve clinic for consideration of TAVR..           Impression:  He has a history of moderate aortic stenosis by echo 1 year ago and the transvalvular gradients measured at his recent cath also suggest moderate aortic stenosis. I reviewed his echo from last year and there is significant calcification and restricted leaflet motion that looks like severe AS although the pressure gradients are only in the moderate range. He does have a reduced ejection fraction and could have low output/low gradient severe AS although his cardiac output at cath was normal. His symptom is only angina and he denies shortness of breath and dizziness. He may get angina before he exerts himself enough to have any other symptoms. He does have exertional fatigue. He has a high risk Myoview showing extensive LAD territory ischemia and the LAD is diffusely diseased with a patent LIMA grafted to it. There is nothing else to do for this. His aortic stenosis could be exacerbating his ischemia and it may still be worth replacing the aortic valve. I think we need to repeat his echo to reevaluate this since the last one was a year ago and the recent cath gradients are only moderate. We need to be sure that he really has  severe AS to recommend TAVR.  I reviewed all of this with him and his wife.  Plan:  He will have a 2D echo and return to see me to discuss the results.     Gaye Pollack, MD 02/10/2014

## 2014-02-12 ENCOUNTER — Ambulatory Visit (HOSPITAL_COMMUNITY)
Admission: RE | Admit: 2014-02-12 | Discharge: 2014-02-12 | Disposition: A | Discharge status: Home / Self Care | Payer: Medicare Other | Source: Ambulatory Visit | Attending: Surgery | Admitting: Surgery

## 2014-02-12 DIAGNOSIS — I350 Nonrheumatic aortic (valve) stenosis: Secondary | ICD-10-CM | POA: Insufficient documentation

## 2014-02-12 DIAGNOSIS — I4891 Unspecified atrial fibrillation: Secondary | ICD-10-CM | POA: Insufficient documentation

## 2014-02-12 DIAGNOSIS — I359 Nonrheumatic aortic valve disorder, unspecified: Secondary | ICD-10-CM | POA: Diagnosis not present

## 2014-02-12 NOTE — Progress Notes (Signed)
  Echocardiogram 2D Echocardiogram has been performed.  Darlina Sicilian M 02/12/2014, 3:26 PM

## 2014-02-17 ENCOUNTER — Ambulatory Visit (INDEPENDENT_AMBULATORY_CARE_PROVIDER_SITE_OTHER): Payer: Medicare Other | Admitting: Surgery

## 2014-02-17 ENCOUNTER — Encounter: Payer: Self-pay | Admitting: Surgery

## 2014-02-17 VITALS — BP 116/73 | HR 82 | Resp 20 | Ht 66.0 in | Wt 151.0 lb

## 2014-02-17 DIAGNOSIS — I350 Nonrheumatic aortic (valve) stenosis: Secondary | ICD-10-CM | POA: Diagnosis not present

## 2014-02-17 DIAGNOSIS — I259 Chronic ischemic heart disease, unspecified: Secondary | ICD-10-CM | POA: Diagnosis not present

## 2014-02-19 ENCOUNTER — Other Ambulatory Visit: Payer: Self-pay | Admitting: Cardiology

## 2014-02-19 ENCOUNTER — Encounter: Payer: Self-pay | Admitting: Surgery

## 2014-02-19 NOTE — Progress Notes (Signed)
HPI:  The patient returns today for follow up after a 2D echocardiogram to reevaluate his aortic stenosis. This was personally reviewed by me. The aortic valve is calcified and has restricted mobility but the mean gradient is only 29 mm Hg, which is similar to his echo last year and similar to the cath gradients ( peak to peak of 31 mm Hg and mean of 26 mm Hg). His EF is 55-60%. The dimensionless index is 0.23. The valve area was estimated at 0.71 cm2. His primary symptom remain tightness and burning in his throat with any significant exertion. He also has some exertional fatigue and has to rest a lot according to his wife. He has not had any significant shortness of breath or peripheral edema.  Current Outpatient Prescriptions  Medication Sig Dispense Refill  . amLODipine (NORVASC) 5 MG tablet TAKE 1 TABLET BY MOUTH EVERY EVENING. 90 tablet 2  . aspirin 81 MG tablet Take 81 mg by mouth daily.      . clopidogrel (PLAVIX) 75 MG tablet Take 75 mg by mouth daily.    . Coenzyme Q10 (CO Q 10 PO) Take 1 tablet by mouth daily.     . diazepam (VALIUM) 5 MG tablet Take 5 mg by mouth daily as needed for anxiety.    . furosemide (LASIX) 40 MG tablet Take 40 mg by mouth daily.    . isosorbide mononitrate (IMDUR) 60 MG 24 hr tablet Take 60 mg by mouth every morning.    Marland Kitchen KRILL OIL PO Take 1 tablet by mouth daily.    Marland Kitchen latanoprost (XALATAN) 0.005 % ophthalmic solution Place 1 drop into both eyes at bedtime.     Marland Kitchen lisinopril (PRINIVIL,ZESTRIL) 20 MG tablet Take 20 mg by mouth 2 (two) times daily.    . Magnesium Oxide (MAG-OXIDE PO) Take 1 tablet by mouth daily.    . metoprolol (LOPRESSOR) 100 MG tablet Take 50 mg by mouth every morning.    . metoprolol (LOPRESSOR) 50 MG tablet Take 25 mg tablet in the evening    . Multiple Vitamin (MULTIVITAMIN) tablet Take 1 tablet by mouth daily.      . niacin (SLO-NIACIN) 500 MG tablet Take 500 mg by mouth at bedtime.      . nitroGLYCERIN (NITROSTAT) 0.4 MG SL  tablet Place 1 tablet (0.4 mg total) under the tongue every 5 (five) minutes as needed. 25 tablet 1  . potassium chloride (K-DUR,KLOR-CON) 10 MEQ tablet Take 10 mEq by mouth 2 (two) times daily.      . rosuvastatin (CRESTOR) 20 MG tablet Take 20 mg by mouth daily.    . Thiamine HCl (VITAMIN B-1) 100 MG tablet Take 100 mg by mouth daily.      . furosemide (LASIX) 40 MG tablet TAKE 1 TABLET BY MOUTH DAILY. 90 tablet 0   No current facility-administered medications for this visit.     Physical Exam: BP 116/73 mmHg  Pulse 82  Resp 20  Ht 5\' 6"  (1.676 m)  Wt 151 lb (68.493 kg)  BMI 24.38 kg/m2  SpO2 99% He looks well Lungs are clear Cardiac exam shows a regular rate and rhythm with a 3/6 systolic murmur at the RSB There is no peripheral edema  Diagnostic Tests:               *[facility]*            [address], [place] [address]              [phone]  ------------------------------------------------------------------- Transthoracic Echocardiography  Patient:  [name], [name] MR #:    [id number] Study Date: 02/12/2014 Gender:   M Age:    80 Height:   167.6 cm Weight:   70.8 kg BSA:    1.83 m^2 Pt. Status: Room:  ATTENDING  Gilford Raid, MD [place], MD [place], MD PERFORMING  [facility], Outpatient SONOGRAPHER Darlina Sicilian, RDCS  cc:  ------------------------------------------------------------------- LV EF: 55% -  60%  ------------------------------------------------------------------- Indications:   Aortic Stenosis (I35.0).  ------------------------------------------------------------------- History:  PMH: Ischemic Cardiomyopathy. Atrial fibrillation. Aortic valve disease.  ------------------------------------------------------------------- Study Conclusions  - Left  ventricle: E/e&quot;>16.9 suggestive of elevated LV filling pressures. The cavity size was mildly dilated. Wall thickness was normal. Systolic function was normal. The estimated ejection fraction was in the range of 55% to 60%. Wall motion was normal; there were no regional wall motion abnormalities. There was an increased relative contribution of atrial contraction to ventricular filling. Doppler parameters are consistent with abnormal left ventricular relaxation (grade 1 diastolic dysfunction). - Aortic valve: Severely calcified annulus. Trileaflet. Severe thickening and calcification. Cusp separation was reduced. There was moderate stenosis. There was mild regurgitation. - Left atrium: The atrium was mildly dilated. - Right ventricle: The cavity size was mildly dilated. Wall thickness was normal.  ------------------------------------------------------------------- Labs, prior tests, procedures, and surgery: Coronary artery bypass grafting.  Transthoracic echocardiography. M-mode, complete 2D, spectral Doppler, and color Doppler. Birthdate: Patient birthdate: [birth year]. Age: Patient is 80 yr old. Sex: Gender: male. BMI: 25.2 kg/m^2. Blood pressure:   120/64 Patient status: Inpatient. Study date: Study date: 02/12/2014. Study time: 02:10 PM. Location: Echo laboratory.  -------------------------------------------------------------------  ------------------------------------------------------------------- Left ventricle: E/e&quot;>16.9 suggestive of elevated LV filling pressures. The cavity size was mildly dilated. Wall thickness was normal. Systolic function was normal. The estimated ejection fraction was in the range of 55% to 60%. Wall motion was normal; there were no regional wall motion abnormalities. There was an increased relative contribution of atrial contraction to ventricular filling. Doppler parameters are consistent with abnormal  left ventricular relaxation (grade 1 diastolic dysfunction).  ------------------------------------------------------------------- Aortic valve: AV is thickened, calcified with moderately restricted motion Peak and mean gradients through the vlave are 50 and 30 mm Hg respectively consistent with moderate AS. Severely calcified annulus. Trileaflet. Severe thickening and calcification. Cusp separation was reduced. Doppler:  There was moderate stenosis.  There was mild regurgitation.  VTI ratio of LVOT to aortic valve: 0.28. Valve area (VTI): 0.87 cm^2. Indexed valve area (VTI): 0.48 cm^2/m^2. Valve area (Vmax): 0.93 cm^2. Indexed valve area (Vmax): 0.51 cm^2/m^2. Mean velocity ratio of LVOT to aortic valve: 0.23. Valve area (Vmean): 0.71 cm^2. Indexed valve area (Vmean): 0.39 cm^2/m^2.  Mean gradient (S): 29 mm Hg. Peak gradient (S): 50 mm Hg.  ------------------------------------------------------------------- Mitral valve:  Calcified annulus. Mildly thickened leaflets . Doppler: There was trivial regurgitation.  Peak gradient (D): 3 mm Hg.  ------------------------------------------------------------------- Left atrium: The atrium was mildly dilated.  ------------------------------------------------------------------- Right ventricle: The cavity size was mildly dilated. Wall thickness was normal. Pacer wire or catheter noted in right ventricle. Systolic function was normal.  ------------------------------------------------------------------- Pulmonic valve:  Structurally normal valve.  Cusp separation was normal. Doppler: Transvalvular velocity was within the normal range. There was trivial regurgitation.  ------------------------------------------------------------------- Tricuspid valve:  Structurally normal valve.  Leaflet  separation was normal. Doppler: Transvalvular velocity was within the normal range. There was mild  regurgitation.  ------------------------------------------------------------------- Right atrium: The atrium was normal in size. Pacer wire or catheter noted in right atrium.  ------------------------------------------------------------------- Pericardium: A trivial pericardial effusion was identified posterior to the heart.  ------------------------------------------------------------------- Measurements  Left ventricle              Value     Reference LV ID, ED, PLAX chordal      (H)   52.6 mm    43 - 52 LV ID, ES, PLAX chordal          36.4 mm    23 - 38 LV fx shortening, PLAX chordal      31  %    >=29 LV PW thickness, ED            8.81 mm    --------- IVS/LV PW ratio, ED            0.93      <=1.3 Stroke volume, 2D             72  ml    --------- Stroke volume/bsa, 2D           39  ml/m^2  --------- LV e&', lateral              5   cm/s   --------- LV E/e&', lateral             16.88     --------- LV e&', medial               5.55 cm/s   --------- LV E/e&', medial              15.21     --------- LV e&', average              5.28 cm/s   --------- LV E/e&', average             16       ---------  Ventricular septum            Value     Reference IVS thickness, ED             8.19 mm    ---------  LVOT                   Value     Reference LVOT ID, S                20  mm    --------- LVOT area                 3.14 cm^2   --------- LVOT peak velocity, S           105  cm/s   --------- LVOT mean velocity, S           58.2 cm/s   --------- LVOT VTI, S                23  cm    --------- LVOT  peak gradient, S           4   mm Hg  ---------  Aortic valve               Value     Reference Aortic valve mean velocity, S       256  cm/s   --------- Aortic valve VTI, S  82.6 cm    --------- Aortic mean gradient, S          29  mm Hg  --------- Aortic peak gradient, S          50  mm Hg  --------- VTI ratio, LVOT/AV            0.28      --------- Aortic valve area, VTI          0.87 cm^2   --------- Aortic valve area/bsa, VTI        0.48 cm^2/m^2 --------- Aortic valve area, peak velocity     0.93 cm^2   --------- Aortic valve area/bsa, peak        0.51 cm^2/m^2 --------- velocity Velocity ratio, mean, LVOT/AV       0.23      --------- Aortic valve area, mean velocity     0.71 cm^2   --------- Aortic valve area/bsa, mean        0.39 cm^2/m^2 --------- velocity  Aorta                   Value     Reference Aortic root ID, ED            29  mm    ---------  Left atrium                Value     Reference LA ID, A-P, ES              47  mm    --------- LA ID/bsa, A-P          (H)   2.57 cm/m^2  <=2.2 LA volume, S               63.2 ml    --------- LA volume/bsa, S             34.6 ml/m^2  --------- LA volume, ES, 1-p A4C          62  ml    --------- LA volume/bsa, ES, 1-p A4C        34  ml/m^2  --------- LA volume, ES, 1-p A2C          56.7 ml    --------- LA volume/bsa, ES, 1-p A2C        31  ml/m^2  ---------  Mitral valve               Value     Reference Mitral E-wave peak velocity        84.4 cm/s   --------- Mitral A-wave peak velocity        97.8 cm/s    --------- Mitral deceleration time         201  ms    150 - 230 Mitral peak gradient, D          3   mm Hg  --------- Mitral E/A ratio, peak          0.9      ---------  Pulmonary arteries            Value     Reference PA pressure, S, DP            19  mm Hg  <=30  Tricuspid valve              Value     Reference Tricuspid regurg peak velocity      198  cm/s   --------- Tricuspid peak  RV-RA gradient       16  mm Hg  ---------  Systemic veins              Value     Reference Estimated CVP               3   mm Hg  ---------  Right ventricle              Value     Reference RV pressure, S, DP            19  mm Hg  <=30 RV s&', lateral, S             4.38 cm/s   ---------  Legend: (L) and (H) mark values outside specified reference range.  ------------------------------------------------------------------- Prepared and Electronically Authenticated by  Fransico Him, MD 2015-11-06T23:35:16  Impression:  He has moderate aortic stenosis by echo and cath gradients but the dimensionless index and estimated valve area are in the severe range. His valve has the appearance of severe stenosis visually on echo. His primary symptom is angina manifested as throat tightness and burning which has been his prior anginal symptom and he does have a markedly abnormal Myoview showing a large area of anterior ischemia and a diffusely diseased LAD with a patent LIMA graft that is not amenable to any intervention. He does have some mild symptoms of exertional fatigue but it is hard to know if that is due to ischemia or his aortic valve. I think it is probably best to continue close observation of him for now but I will discuss him with the heart valve team.   Plan:  I will see him back in 6  months with a repeat echo unless he has worsening symptoms in which case he will let me know sooner. He will also follow up with Dr. Mare Ferrari.

## 2014-02-24 ENCOUNTER — Encounter: Payer: Self-pay | Admitting: Surgery

## 2014-02-24 ENCOUNTER — Ambulatory Visit (INDEPENDENT_AMBULATORY_CARE_PROVIDER_SITE_OTHER): Payer: Medicare Other | Admitting: Surgery

## 2014-02-24 VITALS — BP 116/72 | HR 80 | Resp 20 | Ht 66.0 in | Wt 155.0 lb

## 2014-02-24 DIAGNOSIS — I259 Chronic ischemic heart disease, unspecified: Secondary | ICD-10-CM | POA: Diagnosis not present

## 2014-02-24 DIAGNOSIS — I350 Nonrheumatic aortic (valve) stenosis: Secondary | ICD-10-CM | POA: Diagnosis not present

## 2014-02-25 ENCOUNTER — Encounter: Payer: Self-pay | Admitting: Surgery

## 2014-02-25 ENCOUNTER — Other Ambulatory Visit: Payer: Self-pay | Admitting: *Deleted

## 2014-02-25 DIAGNOSIS — I350 Nonrheumatic aortic (valve) stenosis: Secondary | ICD-10-CM

## 2014-02-25 NOTE — Progress Notes (Signed)
HPI:  He returns today to discuss possible TAVR surgery. He recently had a 2D echo to reevaluate his aortic stenosis. This was personally reviewed by me. The aortic valve is calcified and has restricted mobility but the mean gradient is only 29 mm Hg, which is similar to his echo last year and similar to the cath gradients ( peak to peak of 31 mm Hg and mean of 26 mm Hg). His EF is 55-60%. The dimensionless index is 0.23. The valve area was estimated at 0.71 cm2. His primary symptom remain tightness and burning in his throat with any significant exertion. He also has some exertional fatigue and has to rest a lot according to his wife. He has not had any significant shortness of breath or peripheral edema. I reviewed his case with the heart valve team and his valve clearly is very calcified with restricted leaflet mobility and looks like severe aortic stenosis. He has moderate aortic stenosis by echo and cath gradients but the dimensionless index and estimated valve area are in the severe range.  Current Outpatient Prescriptions  Medication Sig Dispense Refill  . amLODipine (NORVASC) 5 MG tablet TAKE 1 TABLET BY MOUTH EVERY EVENING. 90 tablet 2  . aspirin 81 MG tablet Take 81 mg by mouth daily.      . clopidogrel (PLAVIX) 75 MG tablet Take 75 mg by mouth daily.    . Coenzyme Q10 (CO Q 10 PO) Take 1 tablet by mouth daily.     . diazepam (VALIUM) 5 MG tablet Take 5 mg by mouth daily as needed for anxiety.    . furosemide (LASIX) 40 MG tablet TAKE 1 TABLET BY MOUTH DAILY. 90 tablet 0  . isosorbide mononitrate (IMDUR) 60 MG 24 hr tablet Take 60 mg by mouth every morning.    Marland Kitchen KRILL OIL PO Take 1 tablet by mouth daily.    Marland Kitchen latanoprost (XALATAN) 0.005 % ophthalmic solution Place 1 drop into both eyes at bedtime.     Marland Kitchen lisinopril (PRINIVIL,ZESTRIL) 20 MG tablet Take 20 mg by mouth 2 (two) times daily.    . Magnesium Oxide (MAG-OXIDE PO) Take 1 tablet by mouth daily.    . metoprolol (LOPRESSOR) 100  MG tablet Take 50 mg by mouth every morning.    . metoprolol (LOPRESSOR) 50 MG tablet Take 25 mg tablet in the evening    . Multiple Vitamin (MULTIVITAMIN) tablet Take 1 tablet by mouth daily.      . niacin (SLO-NIACIN) 500 MG tablet Take 500 mg by mouth at bedtime.      . nitroGLYCERIN (NITROSTAT) 0.4 MG SL tablet Place 1 tablet (0.4 mg total) under the tongue every 5 (five) minutes as needed. 25 tablet 1  . potassium chloride (K-DUR,KLOR-CON) 10 MEQ tablet Take 10 mEq by mouth 2 (two) times daily.      . rosuvastatin (CRESTOR) 20 MG tablet Take 20 mg by mouth daily.    . Thiamine HCl (VITAMIN B-1) 100 MG tablet Take 100 mg by mouth daily.       No current facility-administered medications for this visit.     Physical Exam: BP 116/72 mmHg  Pulse 80  Resp 20  Ht 5\' 6"  (1.676 m)  Wt 155 lb (70.308 kg)  BMI 25.03 kg/m2  SpO2 96% He looks well Lungs are clear Cardiac exam shows a regular rate and rhythm with a 3/6 systolic murmur at the RSB There is no peripheral edema   STS Risk Calculator  Risk of Mortality: 4.337% Morbidity or Mortality: 26.307% Long Length of Stay: 9.502% Short Length of Stay: 24.111% Permanent Stroke: 3.037% Prolonged Ventilation: 15.459% DSW Infection: 0.377% Renal Failure: 5.6% Reoperation: 11.804%   Impression:  He has moderate aortic stenosis by echo and cath gradients but the dimensionless index and estimated valve area are in the severe range. His valve has the appearance of severe stenosis visually on echo. His primary symptom is angina manifested as throat tightness and burning which has been his prior anginal symptom and he does have a markedly abnormal Myoview showing a large area of anterior ischemia and a diffusely diseased LAD with a patent LIMA graft that is not amenable to any intervention. He does have some mild symptoms of exertional fatigue but it is hard to know if that is due to ischemia or his aortic valve. He seems most limited in his  activity by the development of throat tightness. After review of his echo and cath we feel that he probably does have severe stenosis and would benefit from valve replacement. He has had 2 prior CABG surgeries and a patent LIMA to the LAD as well as patent SVG's with stents. Coronary revascularization is not an option for this patient. I think his risk of mortality or major morbidity for open surgical AVR would at least 15%, much higher than estimated by the STS risk calculator. I think TAVR would be a much better option for him. I discussed the procedure and the further workup required and he and his wife would like to proceed with that.  Plan:  He will be scheduled for cardiac CT, CTA of the chest, abdomen and pelvis, PFT's, PT consult and will return to see Dr. Roxy Manns and Dr. Angelena Form for consultation.

## 2014-03-08 ENCOUNTER — Encounter: Payer: Medicare Other | Admitting: Thoracic Surgery (Cardiothoracic Vascular Surgery)

## 2014-03-09 ENCOUNTER — Ambulatory Visit (HOSPITAL_COMMUNITY): Payer: Medicare Other

## 2014-03-09 ENCOUNTER — Ambulatory Visit (HOSPITAL_COMMUNITY): Admission: RE | Admit: 2014-03-09 | Payer: Medicare Other | Source: Ambulatory Visit

## 2014-03-09 ENCOUNTER — Ambulatory Visit (HOSPITAL_COMMUNITY)
Admission: RE | Admit: 2014-03-09 | Discharge: 2014-03-09 | Disposition: A | Discharge status: Home / Self Care | Payer: Medicare Other | Source: Ambulatory Visit | Attending: Surgery | Admitting: Surgery

## 2014-03-09 DIAGNOSIS — Z01818 Encounter for other preprocedural examination: Secondary | ICD-10-CM | POA: Insufficient documentation

## 2014-03-09 DIAGNOSIS — I350 Nonrheumatic aortic (valve) stenosis: Secondary | ICD-10-CM

## 2014-03-09 DIAGNOSIS — R0609 Other forms of dyspnea: Secondary | ICD-10-CM | POA: Diagnosis not present

## 2014-03-09 LAB — PULMONARY FUNCTION TEST
DL/VA % pred: 103 %
DL/VA: 4.18 ml/min/mmHg/L
DLCO UNC % PRED: 76 %
DLCO UNC: 17.64 ml/min/mmHg
DLCO cor % pred: 76 %
DLCO cor: 17.64 ml/min/mmHg
FEF 25-75 POST: 3.5 L/s
FEF 25-75 Pre: 3.19 L/sec
FEF2575-%Change-Post: 9 %
FEF2575-%Pred-Post: 241 %
FEF2575-%Pred-Pre: 220 %
FEV1-%Change-Post: 1 %
FEV1-%PRED-POST: 112 %
FEV1-%Pred-Pre: 111 %
FEV1-Post: 2.37 L
FEV1-Pre: 2.33 L
FEV1FVC-%Change-Post: 0 %
FEV1FVC-%Pred-Pre: 119 %
FEV6-%Change-Post: 1 %
FEV6-%Pred-Post: 100 %
FEV6-%Pred-Pre: 98 %
FEV6-Post: 2.75 L
FEV6-Pre: 2.71 L
FEV6FVC-%Change-Post: 0 %
FEV6FVC-%PRED-POST: 108 %
FEV6FVC-%Pred-Pre: 108 %
FVC-%Change-Post: 1 %
FVC-%PRED-POST: 92 %
FVC-%Pred-Pre: 90 %
FVC-PRE: 2.72 L
FVC-Post: 2.75 L
PRE FEV6/FVC RATIO: 100 %
Post FEV1/FVC ratio: 86 %
Post FEV6/FVC ratio: 100 %
Pre FEV1/FVC ratio: 86 %
RV % PRED: 99 %
RV: 2.23 L
TLC % pred: 93 %
TLC: 5.29 L

## 2014-03-09 MED ORDER — ALBUTEROL SULFATE (2.5 MG/3ML) 0.083% IN NEBU
2.5000 mg | INHALATION_SOLUTION | Freq: Once | RESPIRATORY_TRACT | Status: AC
  Administered 2014-03-09: 2.5 mg via RESPIRATORY_TRACT

## 2014-03-10 ENCOUNTER — Telehealth: Payer: Self-pay | Admitting: Cardiology

## 2014-03-10 NOTE — Telephone Encounter (Signed)
New Msg   Spoke with pt, he received flu shot at [facility] on October 9th 2015.

## 2014-03-11 ENCOUNTER — Ambulatory Visit (HOSPITAL_COMMUNITY)
Admission: RE | Admit: 2014-03-11 | Discharge: 2014-03-11 | Disposition: A | Discharge status: Home / Self Care | Payer: Medicare Other | Source: Ambulatory Visit | Attending: Cardiovascular Disease | Admitting: Cardiovascular Disease

## 2014-03-11 ENCOUNTER — Other Ambulatory Visit: Payer: Self-pay | Admitting: Cardiovascular Disease

## 2014-03-11 ENCOUNTER — Ambulatory Visit (HOSPITAL_COMMUNITY)
Admission: RE | Admit: 2014-03-11 | Discharge: 2014-03-11 | Disposition: A | Discharge status: Home / Self Care | Payer: Medicare Other | Source: Ambulatory Visit | Attending: Surgery | Admitting: Surgery

## 2014-03-11 DIAGNOSIS — Z452 Encounter for adjustment and management of vascular access device: Secondary | ICD-10-CM | POA: Diagnosis not present

## 2014-03-11 DIAGNOSIS — Z01818 Encounter for other preprocedural examination: Secondary | ICD-10-CM | POA: Insufficient documentation

## 2014-03-11 DIAGNOSIS — I350 Nonrheumatic aortic (valve) stenosis: Secondary | ICD-10-CM

## 2014-03-11 DIAGNOSIS — I708 Atherosclerosis of other arteries: Secondary | ICD-10-CM | POA: Diagnosis not present

## 2014-03-11 MED ORDER — IOHEXOL 350 MG/ML SOLN
80.0000 mL | Freq: Once | INTRAVENOUS | Status: AC | PRN
  Administered 2014-03-11: 80 mL via INTRAVENOUS

## 2014-03-11 NOTE — Progress Notes (Signed)
This patient has received 80 ml's of IV Omnipaque 350 (type of contrast) contrast extravasation intoRIGHT arm (part of body) during aCT Heart exam.  The exam was performed on 03/11/2014)  Site / affected area assessed by Dr Daryll Brod

## 2014-03-11 NOTE — Procedures (Signed)
Successful placement of a 4 fr vascular sheath for temporary venous access via the left basilic vein.  The IV is ready for immediate use.

## 2014-03-12 ENCOUNTER — Ambulatory Visit: Payer: Medicare Other | Attending: Thoracic Surgery (Cardiothoracic Vascular Surgery) | Admitting: Physical Therapy

## 2014-03-12 ENCOUNTER — Encounter: Payer: Self-pay | Admitting: Physical Therapy

## 2014-03-12 DIAGNOSIS — R262 Difficulty in walking, not elsewhere classified: Secondary | ICD-10-CM | POA: Diagnosis not present

## 2014-03-12 DIAGNOSIS — I350 Nonrheumatic aortic (valve) stenosis: Secondary | ICD-10-CM | POA: Diagnosis not present

## 2014-03-12 NOTE — Therapy (Signed)
Outpatient Rehabilitation [facility] [address] [place], [place], [address] Phone: [phone]   Fax:  [phone]  Physical Therapy Evaluation  Patient Details  Name: [name] MRN: [id number] Date of Birth: 18-May-1933  Encounter Date: Apr 02, 2014      PT End of Session - Apr 02, 2014 1012    Visit Number 1   PT Start Time 1010   PT Stop Time 1051   PT Time Calculation (min) 41 min      Past Medical History  Diagnosis Date  . Cardiac arrest     aborted, atrial fibrillation/flutter  . Presence of automatic cardioverter/defibrillator (AICD)     Medtronic, remote-yes.   . Coronary artery disease   . Aortic stenosis   . Peripheral vascular disease   . Essential hypertension   . Hyperlipidemia   . Angina pectoris      Recurrent angina pectoris  . Claudication     Past Surgical History  Procedure Laterality Date  . Cabg      in 1982 and '84 with subsequent PTCI  . Appendectomy    . Tonsillectomy    . Adenoidectomy    . Cataract surgery       Bilateral cataract surgery  . Left elbow tendon surgery    . Cardiac catheterization   11/16/2009     There were no vitals taken for this visit.  Visit Diagnosis:  Difficulty walking - Plan: PT PLAN OF CARE CERT/RE-CERT      Subjective Assessment - 2014-04-02 1015    Symptoms Pt presents to OP PT with cc: tightness in his throat and incr. fatigue that has worsened over the last few months. Pt is currently undergoing work up for possible TAVR surgery.   Patient Stated Goals improve mobility, return to golf   Currently in Pain? No/denies          [facility] PT Assessment - 2014-04-02 0001    Assessment   Medical Diagnosis severe aortic stenosis   Onset Date 10/10/13   Precautions   Precautions None   Restrictions   Weight Bearing Restrictions No   Balance Screen   Has the patient fallen in the past 6 months No   [place] Private residence   Living Arrangements  Spouse/significant other;Children   Prior Function   Level of [place] with gait   AROM   Overall AROM  Within functional limits for tasks performed   Strength   Overall Strength Within functional limits for tasks performed   Grip (lbs) L 65   Grip (lbs) R 68            PT Education - 04/02/2014 1050    Education provided No              Plan - Apr 02, 2014 1051    Clinical Impression Statement Pt's overall mobility appears to be limited due to SOB and a burning/tightness in his throat that occurs with extended activity. Pt was at increased fall risk with one test while all other were WNL.    PT Frequency One time visit   Consulted and Agree with Plan of Care Patient          G-Codes - 04-02-14 1056    Functional Assessment Tool Used 6 minute walk test   Functional Limitation Mobility: Walking and moving around   Mobility: Walking and Moving Around Current Status (W4315) At least 1 percent but less than 20 percent impaired, limited or restricted   Mobility: Walking  and Moving Around Goal Status [phone]) At least 1 percent but less than 20 percent impaired, limited or restricted   Mobility: Walking and Moving Around Discharge Status [phone]) At least 1 percent but less than 20 percent impaired, limited or restricted                  [facility] Pre-Surgical Assessment - 03/12/14 0001    5 Meter Walk Test- trial 1 5 sec   5 Meter Walk Test- trial 2 5 sec.    5 Meter Walk Test- trial 3 4 sec.  <6 second WNL   5 meter walk test average 4.67 sec   Timed Up & Go Test trial  9 sec.   Comments <12 second WNL   4 Stage Balance Test tolerated for:  5 sec.   4 Stage Balance Test Position 3   comment <10 sec in position 3 inidicates increased fall risk   Sit To Stand Test- trial 1 15 sec.   Comment (12.6 sec age Normal)   ADL/IADL Independent with: Bathing;Dressing;Meal prep;Finances;Yard work   ADL/IADL Therapist, sports Index Vulnerable   6 Minute Walk- Baseline  yes   BP (mmHg) 120/70 mmHg   HR (bpm) 60   02 Sat (%RA) 95 %   Modified Borg Scale for Dyspnea 0- Nothing at all   Perceived Rate of Exertion (Borg) 6-   6 Minute Walk Post Test yes   BP (mmHg) 140/70 mmHg   HR (bpm) 100   02 Sat (%RA) 97 %   Modified Borg Scale for Dyspnea 3- Moderate shortness of breath or breathing difficulty   Perceived Rate of Exertion (Borg) 13- Somewhat hard   Aerobic Endurance Distance Walked 900   Endurance additional comments Pt report initial mild burning in throat at 3:00 and took a standing rest at 4:00 as throat tightness began. Symptoms resolved by 5:30 and he requested to resume the test. After 2-3 minutes of sitting after the test was complete, pt reported full resolution of symptoms.                   Problem List Patient Active Problem List   Diagnosis Date Noted  . Abnormal nuclear stress test 01/29/2014  . Aortic stenosis, moderate 07/07/2012  . Ischemic cardiomyopathy 01/07/12  . Sudden cardiac death-aborted 10/31/2010  . Back pain 07/10/2010  . Hypercholesteremia 07/10/2010  . Benign hypertensive heart disease without heart failure 07/10/2010  . S/P CABG (coronary artery bypass graft) 07/10/2010  . ICD-Medtronic 03/30/2010  . Atrial fibrillation 07/25/2009    Romualdo Bolk, PT, DPT 03/12/2014 11:00 AM Phone: [phone] Fax: [phone]

## 2014-03-15 ENCOUNTER — Encounter: Payer: Self-pay | Admitting: Thoracic Surgery (Cardiothoracic Vascular Surgery)

## 2014-03-15 ENCOUNTER — Other Ambulatory Visit: Payer: Self-pay | Admitting: *Deleted

## 2014-03-15 ENCOUNTER — Institutional Professional Consult (permissible substitution) (INDEPENDENT_AMBULATORY_CARE_PROVIDER_SITE_OTHER): Payer: Medicare Other | Admitting: Thoracic Surgery (Cardiothoracic Vascular Surgery)

## 2014-03-15 VITALS — BP 135/84 | HR 80 | Resp 20 | Ht 66.0 in | Wt 156.0 lb

## 2014-03-15 DIAGNOSIS — I350 Nonrheumatic aortic (valve) stenosis: Secondary | ICD-10-CM | POA: Diagnosis not present

## 2014-03-15 DIAGNOSIS — I255 Ischemic cardiomyopathy: Secondary | ICD-10-CM

## 2014-03-15 DIAGNOSIS — Z951 Presence of aortocoronary bypass graft: Secondary | ICD-10-CM

## 2014-03-15 DIAGNOSIS — I259 Chronic ischemic heart disease, unspecified: Secondary | ICD-10-CM | POA: Diagnosis not present

## 2014-03-15 NOTE — Progress Notes (Signed)
[facility] AND [name]    CARDIOTHORACIC SURGERY CONSULTATION REPORT  Referring Provider is Sinclair Grooms, MD PCP is Darlin Coco, MD  Chief Complaint  Patient presents with  . Aortic Stenosis    further discuss TAVR procedure    HPI:  Patient is [name] with long-standing history of coronary artery disease with ischemic cardiomyopathy, previous coronary artery bypass grafting In 1982 and redo coronary artery bypass grafting in 1994, multiple PCI procedures in the past, history of out-of-hospital cardiac arrest in 2011 with successful resuscitation and subsequent placement of Medtronic ICD, and progressive aortic stenosis for which the patient has recently been evaluated by Dr. Cyndia Bent for possible transcatheter aortic valve replacement. The patient has been referred for second surgical opinion to discuss treatment options. Despite the patient's long and complicated cardiac history he has remained reasonably active and functionally independent. However, over the past year the patient has experienced progressive decline in his exercise tolerance with increasing symptoms of exertional tightness in his upper chest and throat consistent with typical angina pectoris. The patient also has experienced progressive fatigue, decreased exercise tolerance, and mild exertional shortness of breath. He has been followed carefully for many years by Dr. Mare Ferrari. Serial transthoracic echocardiograms have demonstrated slow progression of severity of aortic stenosis with mean transvalvular gradient estimated 35 mmHg and valve area calculated < 1 cm. Left ventricular ejection fraction is mild to moderately reduced. The patient underwent exercise treadmill myoview stress test on 01/28/2014 that was highly abnormal with large reversible anterior wall defect consistent with reversible ischemia. Resting ejection fraction was 32% on that  exam. The patient subsequently underwent left and right heart catheterization 02/01/2014 by Dr. Tamala Julian which revealed that the patient had patent bypass graft from previous coronary artery bypass surgery including patent left internal mammary artery to the left anterior descending coronary artery, saphenous vein graft to the right coronary artery, and saphenous vein graft to the obtuse marginal branch of left circumflex coronary artery.  There was notably severe diffuse disease throughout the left anterior descending coronary artery both prior to and distal to the graft insertion site, although there were no focal sites amenable to PCI.  Peak to peak and mean transvalvular gradient across the aortic valve measured 31 and 26 mmHg respectively by catheterization, with calculated aortic valve area 0.77 cm. The patient was referred to Dr. Cyndia Bent for surgical consultation. The patient was felt to be very high risk for conventional surgical aortic valve replacement and transcatheter aortic valve replacement has been contemplated as a less risky treatment alternative. The patient's most recent transthoracic echocardiogram and diagnostic cardiac catheterization have been reviewed by a multidisciplinary heart valve team, who all agree that the patient has stage D severe symptomatic aortic stenosis despite the relatively low transvalvular gradients.  The patient has subsequently undergone CT angiography to further characterize the anatomical feasibility of transcatheter aortic valve replacement and subsequently referred for a second surgical opinion.  The patient is married and lives with his wife locally in [place]. 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 He has remained physically active and functionally independent for all of his life.  He quit playing tennis after his cardiac arrest in 2011. He has  continued to play golf fairly regularly, although over the past year the patient's exercise tolerance has diminished substantially. He is limited primarily by intermittent tightness  in his upper chest and throat which is brought on with physical exertion and relieved by rest. The patient also admits to some exertional shortness of breath and progressive fatigue. He denies any resting shortness of breath, PND, orthopnea, or lower extremity edema. He has not had dizzy spells, palpitations, nor syncope. His defibrillator has not fired since February of this year.    Past Medical History  Diagnosis Date  . Cardiac arrest     aborted, atrial fibrillation/flutter  . Presence of automatic cardioverter/defibrillator (AICD)     Medtronic, remote-yes.   . Coronary artery disease   . Aortic stenosis   . Peripheral vascular disease   . Essential hypertension   . Hyperlipidemia   . Angina pectoris      Recurrent angina pectoris  . Claudication     Past Surgical History  Procedure Laterality Date  . Cabg      in 1982 and '84 with subsequent PTCI  . Appendectomy    . Tonsillectomy    . Adenoidectomy    . Cataract surgery       Bilateral cataract surgery  . Left elbow tendon surgery    . Cardiac catheterization   11/16/2009     Family History  Problem Relation Age of Onset  . Coronary artery disease Other     family history is dignificant for early CAD in several members    History   Social History  . Marital Status: Married    Spouse Name: N/A    Number of Children: N/A  . Years of Education: N/A   Occupational History  . Not on file.   Social History Main Topics  . Smoking status: Former Smoker    Quit date: 10/25/1952  . Smokeless tobacco: Not on file  . Alcohol Use: No  . Drug Use: No  . Sexual Activity: Not on file   Other Topics Concern  . Not on file   Social History Narrative    Current Outpatient Prescriptions  Medication Sig Dispense Refill  . amLODipine  (NORVASC) 5 MG tablet TAKE 1 TABLET BY MOUTH EVERY EVENING. 90 tablet 2  . aspirin 81 MG tablet Take 81 mg by mouth daily.      . clopidogrel (PLAVIX) 75 MG tablet Take 75 mg by mouth daily.    . Coenzyme Q10 (CO Q 10 PO) Take 1 tablet by mouth daily.     . diazepam (VALIUM) 5 MG tablet Take 5 mg by mouth daily as needed for anxiety.    . furosemide (LASIX) 40 MG tablet TAKE 1 TABLET BY MOUTH DAILY. 90 tablet 0  . isosorbide mononitrate (IMDUR) 60 MG 24 hr tablet Take 60 mg by mouth every morning.    Marland Kitchen KRILL OIL PO Take 1 tablet by mouth daily.    Marland Kitchen latanoprost (XALATAN) 0.005 % ophthalmic solution Place 1 drop into both eyes at bedtime.     Marland Kitchen lisinopril (PRINIVIL,ZESTRIL) 20 MG tablet Take 20 mg by mouth 2 (two) times daily.    . Magnesium Oxide (MAG-OXIDE PO) Take 1 tablet by mouth daily.    . metoprolol (LOPRESSOR) 100 MG tablet Take 50 mg by mouth every morning.    . metoprolol (LOPRESSOR) 50 MG tablet Take 25 mg tablet in the evening    . Multiple Vitamin (MULTIVITAMIN) tablet Take 1 tablet by mouth daily.      . niacin (SLO-NIACIN) 500 MG tablet Take 500 mg by mouth at bedtime.      . nitroGLYCERIN (  NITROSTAT) 0.4 MG SL tablet Place 1 tablet (0.4 mg total) under the tongue every 5 (five) minutes as needed. 25 tablet 1  . potassium chloride (K-DUR,KLOR-CON) 10 MEQ tablet Take 10 mEq by mouth 2 (two) times daily.      . rosuvastatin (CRESTOR) 20 MG tablet Take 20 mg by mouth daily.    . Thiamine HCl (VITAMIN B-1) 100 MG tablet Take 100 mg by mouth daily.       No current facility-administered medications for this visit.    Allergies  Allergen Reactions  . Cyclobenzaprine Hcl     Unspecified reaction  . Tetracycline     Rash       Review of Systems:   General:  normal appetite, decreased energy, slight weight gain, no weight loss, no fever  Cardiac:  + chest pain with exertion, no chest pain at rest, + SOB with exertion, no resting SOB, no PND, no orthopnea, no palpitations,  no arrhythmia, no atrial fibrillation, no LE edema, no dizzy spells, no syncope  Respiratory:  no shortness of breath, no home oxygen, no productive cough, no dry cough, no bronchitis, no wheezing, no hemoptysis, no asthma, no pain with inspiration or cough, no sleep apnea, no CPAP at night  GI:   no difficulty swallowing, no reflux, no frequent heartburn, no hiatal hernia, no abdominal pain, no constipation, no diarrhea, no hematochezia, no hematemesis, no melena  GU:   no dysuria,  no frequency, no urinary tract infection, no hematuria, no enlarged prostate, no kidney stones, no kidney disease  Vascular:  no pain suggestive of claudication, no pain in feet, no leg cramps, no varicose veins, no DVT, no non-healing foot ulcer  Neuro:   no stroke, no TIA's, no seizures, no headaches, no temporary blindness one eye,  no slurred speech, no peripheral neuropathy, no chronic pain, no instability of gait, no memory/cognitive dysfunction  Musculoskeletal: mild arthritis, no joint swelling, no myalgias, no difficulty walking, normal mobility   Skin:   no rash, no itching, no skin infections, no pressure sores or ulcerations  Psych:   no anxiety, no depression, no nervousness, no unusual recent stress  Eyes:   no blurry vision, no floaters, no recent vision changes, + wears glasses or contacts  ENT:   no hearing loss, no loose or painful teeth, no dentures, last saw dentist within the past year  Hematologic:  no easy bruising, no abnormal bleeding, no clotting disorder, no frequent epistaxis  Endocrine:  no diabetes, does not check CBG's at home           Physical Exam:   BP 135/84 mmHg  Pulse 80  Resp 20  Ht 5\' 6"  (1.676 m)  Wt 156 lb (70.761 kg)  BMI 25.19 kg/m2  SpO2 97%  General:    well-appearing  HEENT:  Unremarkable   Neck:   no JVD, no bruits, no adenopathy   Chest:   clear to auscultation, symmetrical breath sounds, no wheezes, no rhonchi   CV:   RRR, grade III/VI  crescendo/decrescendo murmur heard best at RUSB,  no diastolic murmur  Abdomen:  soft, non-tender, no masses   Extremities:  warm, well-perfused, pulses diminished, no LE edema  Rectal/GU  Deferred  Neuro:   Grossly non-focal and symmetrical throughout  Skin:   Clean and dry, no rashes, no breakdown   Diagnostic Tests:  Transthoracic Echocardiography  Patient:  [name], [name] MR #:    [id number] Study Date: 02/12/2014 Gender:   M Age:  79 Height:   167.6 cm Weight:   70.8 kg BSA:    1.83 m^2 Pt. Status: Room:  ATTENDING  Gilford Raid, MD [place], MD Sunny Slopes, MD PERFORMING  [facility], Outpatient SONOGRAPHER Darlina Sicilian, RDCS  cc:  ------------------------------------------------------------------- LV EF: 55% -  60%  ------------------------------------------------------------------- Indications:   Aortic Stenosis (I35.0).  ------------------------------------------------------------------- History:  PMH: Ischemic Cardiomyopathy. Atrial fibrillation. Aortic valve disease.  ------------------------------------------------------------------- Study Conclusions  - Left ventricle: E/e&quot;>16.9 suggestive of elevated LV filling pressures. The cavity size was mildly dilated. Wall thickness was normal. Systolic function was normal. The estimated ejection fraction was in the range of 55% to 60%. Wall motion was normal; there were no regional wall motion abnormalities. There was an increased relative contribution of atrial contraction to ventricular filling. Doppler parameters are consistent with abnormal left ventricular relaxation (grade 1 diastolic dysfunction). - Aortic valve: Severely calcified annulus. Trileaflet. Severe thickening and calcification. Cusp separation was reduced. There was moderate stenosis. There was mild regurgitation. - Left atrium: The atrium was mildly  dilated. - Right ventricle: The cavity size was mildly dilated. Wall thickness was normal.  ------------------------------------------------------------------- Labs, prior tests, procedures, and surgery: Coronary artery bypass grafting.  Transthoracic echocardiography. M-mode, complete 2D, spectral Doppler, and color Doppler. Birthdate: Patient birthdate: [birth year]. Age: Patient is 80 yr old. Sex: Gender: male. BMI: 25.2 kg/m^2. Blood pressure:   120/64 Patient status: Inpatient. Study date: Study date: 02/12/2014. Study time: 02:10 PM. Location: Echo laboratory.  -------------------------------------------------------------------  ------------------------------------------------------------------- Left ventricle: E/e&quot;>16.9 suggestive of elevated LV filling pressures. The cavity size was mildly dilated. Wall thickness was normal. Systolic function was normal. The estimated ejection fraction was in the range of 55% to 60%. Wall motion was normal; there were no regional wall motion abnormalities. There was an increased relative contribution of atrial contraction to ventricular filling. Doppler parameters are consistent with abnormal left ventricular relaxation (grade 1 diastolic dysfunction).  ------------------------------------------------------------------- Aortic valve: AV is thickened, calcified with moderately restricted motion Peak and mean gradients through the vlave are 50 and 30 mm Hg respectively consistent with moderate AS. Severely calcified annulus. Trileaflet. Severe thickening and calcification. Cusp separation was reduced. Doppler:  There was moderate stenosis.  There was mild regurgitation.  VTI ratio of LVOT to aortic valve: 0.28. Valve area (VTI): 0.87 cm^2. Indexed valve area (VTI): 0.48 cm^2/m^2. Valve area (Vmax): 0.93 cm^2. Indexed valve area (Vmax): 0.51 cm^2/m^2. Mean velocity ratio of LVOT to aortic valve: 0.23. Valve area  (Vmean): 0.71 cm^2. Indexed valve area (Vmean): 0.39 cm^2/m^2.  Mean gradient (S): 29 mm Hg. Peak gradient (S): 50 mm Hg.  ------------------------------------------------------------------- Mitral valve:  Calcified annulus. Mildly thickened leaflets . Doppler: There was trivial regurgitation.  Peak gradient (D): 3 mm Hg.  ------------------------------------------------------------------- Left atrium: The atrium was mildly dilated.  ------------------------------------------------------------------- Right ventricle: The cavity size was mildly dilated. Wall thickness was normal. Pacer wire or catheter noted in right ventricle. Systolic function was normal.  ------------------------------------------------------------------- Pulmonic valve:  Structurally normal valve.  Cusp separation was normal. Doppler: Transvalvular velocity was within the normal range. There was trivial regurgitation.  ------------------------------------------------------------------- Tricuspid valve:  Structurally normal valve.  Leaflet separation was normal. Doppler: Transvalvular velocity was within the normal range. There was mild regurgitation.  ------------------------------------------------------------------- Right atrium: The atrium was normal in size. Pacer wire or catheter noted in right atrium.  ------------------------------------------------------------------- Pericardium: A trivial pericardial effusion was identified posterior to the heart.  ------------------------------------------------------------------- Measurements  Left ventricle  Value     Reference LV ID, ED, PLAX chordal      (H)   52.6 mm    43 - 52 LV ID, ES, PLAX chordal          36.4 mm    23 - 38 LV fx shortening, PLAX chordal      31  %    >=29 LV PW thickness, ED            8.81 mm    --------- IVS/LV PW ratio, ED             0.93      <=1.3 Stroke volume, 2D             72  ml    --------- Stroke volume/bsa, 2D           39  ml/m^2  --------- LV e&', lateral              5   cm/s   --------- LV E/e&', lateral             16.88     --------- LV e&', medial               5.55 cm/s   --------- LV E/e&', medial              15.21     --------- LV e&', average              5.28 cm/s   --------- LV E/e&', average             16       ---------  Ventricular septum            Value     Reference IVS thickness, ED             8.19 mm    ---------  LVOT                   Value     Reference LVOT ID, S                20  mm    --------- LVOT area                 3.14 cm^2   --------- LVOT peak velocity, S           105  cm/s   --------- LVOT mean velocity, S           58.2 cm/s   --------- LVOT VTI, S                23  cm    --------- LVOT peak gradient, S           4   mm Hg  ---------  Aortic valve               Value     Reference Aortic valve mean velocity, S       256  cm/s   --------- Aortic valve VTI, S            82.6 cm    --------- Aortic mean gradient, S          29  mm Hg  --------- Aortic peak gradient, S          50  mm Hg  --------- VTI ratio, LVOT/AV            0.28      ---------  Aortic valve area, VTI          0.87 cm^2   --------- Aortic valve area/bsa, VTI        0.48 cm^2/m^2 --------- Aortic valve area, peak velocity     0.93 cm^2   --------- Aortic valve area/bsa, peak        0.51 cm^2/m^2 --------- velocity Velocity ratio,  mean, LVOT/AV       0.23      --------- Aortic valve area, mean velocity     0.71 cm^2   --------- Aortic valve area/bsa, mean        0.39 cm^2/m^2 --------- velocity  Aorta                   Value     Reference Aortic root ID, ED            29  mm    ---------  Left atrium                Value     Reference LA ID, A-P, ES              47  mm    --------- LA ID/bsa, A-P          (H)   2.57 cm/m^2  <=2.2 LA volume, S               63.2 ml    --------- LA volume/bsa, S             34.6 ml/m^2  --------- LA volume, ES, 1-p A4C          62  ml    --------- LA volume/bsa, ES, 1-p A4C        34  ml/m^2  --------- LA volume, ES, 1-p A2C          56.7 ml    --------- LA volume/bsa, ES, 1-p A2C        31  ml/m^2  ---------  Mitral valve               Value     Reference Mitral E-wave peak velocity        84.4 cm/s   --------- Mitral A-wave peak velocity        97.8 cm/s   --------- Mitral deceleration time         201  ms    150 - 230 Mitral peak gradient, D          3   mm Hg  --------- Mitral E/A ratio, peak          0.9      ---------  Pulmonary arteries            Value     Reference PA pressure, S, DP            19  mm Hg  <=30  Tricuspid valve              Value     Reference Tricuspid regurg peak velocity      198  cm/s   --------- Tricuspid peak RV-RA gradient       16  mm Hg  ---------  Systemic veins              Value     Reference Estimated CVP               3   mm Hg  ---------  Right ventricle  Value     Reference RV  pressure, S, DP            19  mm Hg  <=30 RV s&', lateral, S             4.38 cm/s   ---------  Legend: (L) and (H) mark values outside specified reference range.  ------------------------------------------------------------------- Prepared and Electronically Authenticated by  Fransico Him, MD 2015-11-06T23:35:16       Left Heart Catheterization with Coronary Angiography , Bypass Angio, and Abdominal Angiogram Report  KHYLAN SAWYER  80 y.o.  male 1934-03-19  Procedure Date: 02/01/2014 Referring Physician: Darlin Coco, M.D. Primary Cardiologist: Darlin Coco, M.D.  INDICATIONS: Progressive angina and severe aortic stenosis  PROCEDURE: 1. Left heart catheterization; 2. Left ventriculography; 3. Coronary angiography; 4. Bypass graft angiography; 5. Left internal mammary graft angiography; 6. Abdominal aortography  CONSENT:  The risks, benefits, and details of the procedure were explained in detail to the patient. Risks including death, stroke, heart attack, kidney injury, allergy, limb ischemia, bleeding and radiation injury were discussed. The patient verbalized understanding and wanted to proceed. Informed written consent was obtained.  PROCEDURE TECHNIQUE: After Xylocaine anesthesia a 5 French sheath was placed in the right femoral artery using the modified Seldinger technique. A 7 French sheath was placed in the right femoral vein using the Seldinger technique. 2% Xylocaine was used for local anesthesia. Coronary angiography was done using a 5 F a to MP, JR4, JL 3.5 cm, and Internal Mammary diagnostic catheters. Left ventriculography was done using the JR4 catheter and hand injection. Abdominal aortography was performed using an angled pigtail catheter and power injection at 18 cc/s for a total of 40 cc of contrast.  Digital images were reviewed. Hemodynamics were reviewed. The case was terminated. Manual compression was used  for hemostasis.  CONTRAST: Total of 139 cc.  COMPLICATIONS: None   HEMODYNAMICS: Aortic pressure 112/54 mmHg; LV pressure 143 over 9 mmHg; LVEDP 16 mmHg; RA 5 mmHg; RV 24 over 8 mmHg; PA 26 over 13 mmHg; PCWP(mean) 10 mmHg; Cardiac Output Fick 4.97 L/m; thermodilution 3.62 L/m; AV gradient 31 mmHg peak to peak and 26 mmHg mean gradients; aortic valve area using Fick cardiac output 1.06 cm; aortic valve area using the thermodilution cardiac output 0.77 cm.  ANGIOGRAPHIC DATA: The left main coronary artery is heavily calcified and totally occluded.  The left anterior descending artery is totally occluded and supplied only by the left internal mammary.  The left circumflex artery is totally occluded.  The right coronary artery is totally occluded at the ostium.  BYPASS GRAFT ANGIOGRAPHY: Saphenous vein graft to the obtuse marginal: Widely patent with luminal irregularities. A proximal/ostial stent and a mid body stent is widely patent.  Saphenous vein graft to the right coronary: contains luminal irregularities but no significant obstruction is noted  Internal mammary graft to the LAD: Widely patent. The native left anterior descending is severely and diffusely diseased. There is 90% stenosis antegrade and also 90% stenosis retrograde. The apical LAD is also severely diseased. Collaterals to the right ventricular branch of the RCA arise from the LAD.  ABDOMINAL AORTOGRAPHY: Demonstrated 70% right renal artery stenosis. 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 There is 40-50% ostial left renal artery stenosis. The left internal iliac contains 90% stenosis. The right internal iliac is also 50% narrowing.   LEFT VENTRICULOGRAM: Left ventricular angiogram was done in the 30 RAO projection and revealed a normal left ventricular cavity size with an estimated  ejection fraction of 35-40%. No obvious mitral regurgitation was noted.   IMPRESSIONS: 1. Patent bypass grafts, very  similar to the last heart catheterization in 2011. The grafts include a saphenous vein graft to the right coronary, saphenous vein graft to the obtuse marginal. The left internal mammary graft to the LAD is widely patent. 2. The left anterior descending is severely and diffusely diseased, both antegrade and retrograde from the graft insertion site. This is likely the source of the patient's anginal complaints. No focal sites for intervention exist. 3. Severe aortic stenosis, calcific 4. Left ventricular systolic dysfunction with ejection fraction 35-40%. No obvious mitral regurgitation is noted.   RECOMMENDATION: Preferred a heart valve clinic for consideration of TAVR.Marland Kitchen            Cardiac TAVR CT  TECHNIQUE: The patient was scanned on a Philips 256 scanner. A 120 kV retrospective scan was triggered in the descending thoracic aorta at 111 HU's. Gantry rotation speed was 270 msecs and collimation was .9 mm. No beta blockade or nitro were given. The 3D data set was reconstructed in 5% intervals of the R-R cycle. Systolic and diastolic phases were analyzed on a dedicated work station using MPR, MIP and VRT modes. The patient received 80 cc of contrast.  Note IR Radiology needed to start appropriate IV for case  FINDINGS: Aortic Valve: Trileaflet and heavily calcified.  Sinotubular Junction: 2.5 cm with heavy calcification  Ascending Thoracic Aorta: 3.1 cm  Aortic Arch: 2.7 cm  Descending Thoracic Aorta: 2.6 cm  Sinus of Valsalva Measurements:  Non-coronary: 29.5 mm  Right -coronary: 29 mm  Left -coronary: 30.8 mm  Coronary Artery Height above Annulus:  Left Main: 13.6 mm  Right Coronary: 14.8 mm  Virtual Basal Annulus Measurements:  Maximum/Minimum Diameter: 29.5 x 21 mm  Perimeter: 80 mm  Area: 466 mm2  Coronary Arteries: The native coronary arteries are occluded The ostium are heavily calcified The SVG to RCA is patent.  The SVG to OM is patent and appears to be stented. The LIMA to the LAD is patent  Optimum Fluoroscopic Angle for Delivery: LAO 3 degrees Cranial 1 degree  IMPRESSION: 1) Heavily calcified trileaflet aortic valve suitable for 26 mm Sapien XT valve  2) Unfavorable characteristics for TAVR include large area of calcification of the sinotubular junction. Heavy annular calcification between the non and left coronary cusps and dense extensive  Subvalvular calcification extending into the intervalvular fibrosa and base of anterior mitral leaflet  3) Coronary heights sufficient for deployment but circulation fed by grafts anyway  4) Moderate calcification of aortic Kartch with no large atheroma and normal origin of great vessels  5) Optimum angiographic angle for delivery LAO 3 degrees and Cranial 1 degree  6) Occluded native arteries with patient SVG RCA and OM and patent LIMA to LAD  Jenkins Rouge   Electronically Signed  By: Jenkins Rouge M.D.  On: 03/12/2014 14:58      Study Result     EXAM: OVER-READ INTERPRETATION CT CHEST  The following report is an over-read performed by radiologist Dr. Rebekah [facility] on 03/11/2014. This over-read does not include interpretation of cardiac or coronary anatomy or pathology. The coronary calcium score/coronary CTA interpretation by the cardiologist is attached.  COMPARISON: Chest CT 04/24/2010.  FINDINGS: Within the visualized portions of the thorax there are no suspicious appearing pulmonary nodules or masses, there is no acute consolidative airspace disease, no pneumothorax and no pleural effusion. No pathologically enlarged mediastinal or  hilar lymph nodes are noted. Small hiatal hernia. Visualized portions of the upper abdomen are unremarkable. There are no aggressive appearing lytic or blastic lesions noted in the visualized portions of the skeleton. Post vertebroplasty  changes are noted in the T8 vertebral body.  IMPRESSION: 1. No significant incidental noncardiac findings noted. 2. Incidental findings, as above.  Electronically Signed: By: Vinnie Langton M.D. On: 03/11/2014 16:23     CTA ABDOMEN AND PELVIS WITH CONTRAST  TECHNIQUE: Multidetector CT imaging of the abdomen and pelvis was performed using the standard protocol during bolus administration of intravenous contrast. Multiplanar reconstructed images and MIPs were obtained and reviewed to evaluate the vascular anatomy.  CONTRAST: 59mL OMNIPAQUE IOHEXOL 350 MG/ML SOLN  COMPARISON: None.  FINDINGS: CTA ABDOMEN AND PELVIS FINDINGS  Lower chest: Pacemaker lead in the right ventricular apex. Atherosclerotic calcifications in the right coronary artery. Median sternotomy wires. Small hiatal hernia.  Hepatobiliary: Well-defined 2.1 x 2.2 cm low-attenuation lesion in segment 4B of the liver adjacent to the gallbladder fossa, compatible with a simple cyst. Remaining portions of the visualized liver (dome of the right lobe of the liver was incompletely visualized) are normal in appearance. No intra or extrahepatic biliary ductal dilatation. Gallbladder is normal in appearance.  Pancreas: Unremarkable.  Spleen: Unremarkable.  Adrenals/Urinary Tract: Bilateral adrenal glands are normal in appearance. There are multiple subcentimeter low attenuation lesions in the kidneys bilaterally which are too small to definitively characterize (statistically favored to represent small cysts). In addition, there are multiple larger well-defined low-attenuation nonenhancing lesions, compatible with simple cysts, the largest of which is exophytic measuring 5.2 cm in the anterior aspect of the upper pole of the left kidney. No hydroureteronephrosis. Urinary bladder is unremarkable in appearance.  Stomach/Bowel: The appearance of the stomach is normal. Large diverticulum from the  second portion of the duodenum incidentally noted. No pathologic dilatation of small bowel or colon. Numerous colonic diverticulae are, particularly in the sigmoid colon, without surrounding inflammatory changes to suggest an acute diverticulitis at this time.  Vascular/Lymphatic: Vascular findings and measurements pertinent to potential TAVR procedure, as detailed below. In addition, there is a stent in the proximal left superficial femoral artery which is grossly patent (but incompletely visualized). No definite lymphadenopathy in the abdomen or pelvis.  Reproductive: Prostate gland and seminal vesicles are unremarkable in appearance.  Other: Small amount of abnormal soft tissue in the left lower quadrant immediately adjacent to the left inguinal ring, with some soft tissue thickening tracking into the left inguinal canal, strongly favored to represent a postoperative change related to prior left inguinal herniorrhaphy. No significant volume of ascites. No pneumoperitoneum.  Musculoskeletal: There are no aggressive appearing lytic or blastic lesions noted in the visualized portions of the skeleton.  VASCULAR MEASUREMENTS PERTINENT TO TAVR:  AORTA:  Minimal Aortic Diameter - 14 x 12 mm  Severity of Aortic Calcification - Severe and circumferential  RIGHT PELVIS:  Right Common Iliac Artery -  Minimal Diameter - 4.7 x 2.2 mm  Tortuosity - moderate  Calcification - severe  Right External Iliac Artery -  Minimal Diameter - 6.6 x 7.3 mm  Tortuosity - mild  Calcification - mild  Right Common Femoral Artery -  Minimal Diameter - 6.8 x 6.8 mm  Tortuosity - mild  Calcification - moderate  LEFT PELVIS:  Left Common Iliac Artery -  Minimal Diameter - 8.6 x 4.7 mm  Tortuosity - moderate  Calcification - severe  Left External Iliac Artery -  Minimal Diameter - 7.6  x 7.0 mm  Tortuosity - mild  Calcification - mild  Left  Common Femoral Artery -  Minimal Diameter - 5.8 x 5.6 mm  Tortuosity - mild  Calcification - moderate  Review of the MIP images confirms the above findings.  IMPRESSION: 1. Vascular findings and measurements pertinent to potential TAVR procedure, as detailed above. This patient does not have suitable pelvic arterial access on the right side. On the left side, depending on the location of cannulation of the left femoral artery, vascular access may be reasonable (the superior aspect of the left common femoral artery is greater than 6 mm average diameter, but the mean diameter in the distal left common femoral artery is only 5.7 mm). However, there is extensive atherosclerosis in the left common iliac artery with nearly circumferential calcification and mean diameter of only 6.6 mm. Clinical correlation based on planned size of transcatheter aortic valve implant is recommended. 2. Additional incidental findings, as above.   Electronically Signed  By: Vinnie Langton M.D.  On: 03/11/2014 17:09    Pulmonary Function Tests:  Results for MILIK, GILREATH (MRN [id number]) as of 03/15/2014 13:13  Ref. Range 03/09/2014 11:18  FVC-Pre No range found 2.72  FVC-%Pred-Pre No range found 90  FEV1-Pre No range found 2.33  FEV1-%Pred-Pre No range found 111  Pre FEV1/FVC ratio No range found 86  FEV1FVC-%Pred-Pre No range found 119  FEF 25-75 Pre No range found 3.19  FEF2575-%Pred-Pre No range found 220  FEV6-Pre No range found 2.71  FEV6-%Pred-Pre No range found 98  Pre FEV6/FVC Ratio No range found 100  FEV6FVC-%Pred-Pre No range found 108  FVC-Post No range found 2.75  FVC-%Pred-Post No range found 92  FVC-%Change-Post No range found 1  FEV1-Post No range found 2.37  FEV1-%Pred-Post No range found 112  FEV1-%Change-Post No range found 1  Post FEV1/FVC ratio No range found 86  FEV1FVC-%Change-Post No range found 0  FEF 25-75 Post No range found 3.50  FEF2575-%Pred-Post  No range found 241  FEF2575-%Change-Post No range found 9  FEV6-Post No range found 2.75  FEV6-%Pred-Post No range found 100  FEV6-%Change-Post No range found 1  Post FEV6/FVC ratio No range found 100  FEV6FVC-%Pred-Post No range found 108  FEV6FVC-%Change-Post No range found 0  TLC No range found 5.29  TLC % pred No range found 93  RV No range found 2.23  RV % pred No range found 99  DLCO cor No range found 17.64  DLCO cor % pred No range found 76  DLCO unc No range found 17.64  DLCO unc % pred No range found 76  DL/VA No range found 4.18  DL/VA % pred No range found 103     STS Risk Calculator  Procedure    3rd time redo CABG + AVR  Risk of Mortality   7.8% Morbidity or Mortality  36.7% Prolonged LOS   15.9% Short LOS    18.3% Permanent Stroke   3.3% Prolonged Vent Support  24.5% DSW Infection    0.4% Renal Failure    11.3% Reoperation    14.4%   Impression:  The patient has stage D severe symptomatic aortic stenosis with underlying ischemic cardiomyopathy and long-standing severe three-vessel coronary artery disease.  He presents with progressive symptoms of exertional tightness in his upper chest and throat consistent with stable angina pectoris. He also admits some exertional shortness of breath and progressive exertional fatigue consistent with chronic diastolic congestive heart failure, [place] Heart Association functional class II.  I have personally reviewed the patient's most recent transthoracic echocardiogram and diagnostic cardiac catheterization.  Transthoracic echocardiogram demonstrates severe thickening and calcification with severely restricted leaflet motion involving all 3 leaflets of the aortic valve. Peak velocity across the aortic valve has been measured slightly less than 4 m/sec and mean transvalvular gradient has been estimated between 30 and 35 mmHg, but visually the valve appears to be severely stenotic and the dimensionless ratio of LVOT/aortic  valve peak velocity is only 0.23.  I agree that these findings are consistent with low flow/low gradient severe aortic stenosis.  Recent diagnostic cardiac catheterization demonstrates stable coronary anatomy with patent bypass grafts to all 3 major coronary vascular distributions.  The patient does have diffuse coronary artery disease involving the left anterior descending coronary artery both before and after the distal insertion of the patent left internal mammary artery graft.  However, there are no focal lesions that could be treated percutaneously, and repeat surgical revascularization is not an option.  Risks associated with conventional surgical aortic valve replacement would be extremely high, primarily because of the fact the patient has had 2 previous coronary artery bypass operations. In addition, the patient has extensive calcification within the aortic root which would further increase risks associated with conventional surgery.  As a result, I would not consider this patient a candidate for conventional surgical aortic valve replacement with or without 3rd time redo coronary artery bypass grafting.  I agree that transcatheter aortic valve replacement may prove to be much less risky treatment alternative.  After review of the patient's recent CT angiograms, he appears to be an acceptable candidate for transcatheter aortic valve replacement.  Complicating anatomical features include extensive calcification within the aortic root and a relatively narrow sinotubular junction.  The patient does have moderate to severe atherosclerotic disease involving the infrarenal aorta and iliac vessels with borderline radiographic features for transfemoral access.      Plan:  The patient and his wife were counseled at length regarding treatment alternatives for management of severe symptomatic aortic stenosis. Alternative approaches such as conventional aortic valve replacement, transcatheter aortic valve  replacement, and palliative medical therapy were compared and contrasted at length.  The risks associated with conventional surgical aortic valve replacement were been discussed in detail, as were expectations for post-operative convalescence. Long-term prognosis with medical therapy was discussed. This discussion was placed in the context of the patient's own specific clinical presentation and past medical history.  Following the decision to proceed with transcatheter aortic valve replacement, a discussion has been held regarding what types of management strategies would be attempted intraoperatively in the event of life-threatening complications, including whether or not the patient would be considered a candidate for the use of cardiopulmonary bypass and/or conversion to open sternotomy for attempted surgical intervention.  The patient has been advised of a variety of complications that might develop including but not limited to risks of death, stroke, paravalvular leak, aortic dissection or other major vascular complications, aortic annulus rupture, device embolization, cardiac rupture or perforation, mitral regurgitation, acute myocardial infarction, arrhythmia, heart block or bradycardia requiring permanent pacemaker placement, congestive heart failure, respiratory failure, renal failure, pneumonia, infection, other late complications related to structural valve deterioration or migration, or other complications that might ultimately cause a temporary or permanent loss of functional independence or other long term morbidity.  The patient provides full informed consent for the procedure as described and all questions were answered.  The patient is scheduled to be seen in consultation by Dr.  McAlhany later this week.  He will tentatively be scheduled for transcatheter aortic valve replacement on Tuesday, 03/23/2014 via transfemoral approach, with possible transapical approach if necessary.   The patient has been  instructed to stop taking Plavix after he takes his dose tomorrow in anticipation of surgery.    Valentina Gu. Roxy Manns, MD 03/15/2014 1:11 PM

## 2014-03-15 NOTE — Patient Instructions (Signed)
  Patient has been instructed to stop taking Plavix after taking his dose tomorrow morning  Patient should continue taking all other medications without change through the day before surgery.  Patient should have nothing to eat or drink after midnight the night before surgery.  On the morning of surgery patient should take only metoprolol, isosorbide mononitrate and amlodipine with a sip of water.

## 2014-03-16 DIAGNOSIS — H401232 Low-tension glaucoma, bilateral, moderate stage: Secondary | ICD-10-CM | POA: Diagnosis not present

## 2014-03-18 ENCOUNTER — Ambulatory Visit (INDEPENDENT_AMBULATORY_CARE_PROVIDER_SITE_OTHER): Payer: Medicare Other | Admitting: Cardiovascular Disease

## 2014-03-18 ENCOUNTER — Encounter (HOSPITAL_COMMUNITY): Payer: Self-pay | Admitting: Interventional Cardiology

## 2014-03-18 DIAGNOSIS — I259 Chronic ischemic heart disease, unspecified: Secondary | ICD-10-CM

## 2014-03-18 DIAGNOSIS — I350 Nonrheumatic aortic (valve) stenosis: Secondary | ICD-10-CM | POA: Diagnosis not present

## 2014-03-18 NOTE — Progress Notes (Signed)
History of Present Illness: 80 yo male with history of CAD s/p CABG (1982 with redo 1994), ischemic cardiomyopathy, out-of-hospital cardiac arrest in 2011 with successful resuscitation and subsequent placement of Medtronic ICD, and progressive aortic stenosis who is here today for assessment of his aortic valve stenosis. His aortic stenosis is now severe. He has been evaluated by Dr. Roxy Manns and Dr. Cyndia Bent in the valve clinic. He is felt to be a good candidate for transcatheter aortic valve replacement. He describes progressive decline in his exercise tolerance with increasing symptoms of exertional tightness in his upper chest and throat consistent with typical angina pectoris. The patient also has experienced progressive fatigue, decreased exercise tolerance, and mild exertional shortness of breath. He has been followed for many years by Dr. Mare Ferrari. Serial transthoracic echocardiograms have demonstrated slow progression of severity of aortic stenosis with mean transvalvular gradient estimated 35 mmHg and valve area calculated < 1 cm. Left ventricular ejection fraction is moderately reduced (LVEF=32%). The patient underwent exercise treadmill myoview stress test on 01/28/2014 that was highly abnormal with large reversible anterior wall defect consistent with reversible ischemia. Resting ejection fraction was 32% on that exam. The patient subsequently underwent left and right heart catheterization 02/01/2014 by Dr. Tamala Julian which revealed that the patient had patent bypass graft from previous coronary artery bypass surgery including patent LIMA to LAD, SVG to RCA and SVG to OM. There was severe diffuse disease throughout the LAD both proximal to and distal to the graft insertion site, but no focal sites for PCI. Peak to peak and mean transvalvular gradient across the aortic valve measured 31 and 26 mmHg respectively by catheterization, with calculated aortic valve area 0.77 cm. His symptoms are felt to be  worsened by his aortic valve stenosis. He has been felt to be very high risk for conventional surgical aortic valve replacement. He is felt to be a good candidate for transcatheter aortic valve replacement. Dr. Roxy Manns and Dr. Cyndia Bent have independently reviewed this and have both agreed that TAVR is the best option. The patient has undergone CT angiography and is felt to have suitable access for transfemoral approach to TAVR.   He tells me today that he has been very active his entire life. He lives in [place] with his wife. He is retired. He is an avid golfer but he has been limited by his exercise intolerance, exertional chest pain and exertional dyspnea. He denies any resting shortness of breath, PND, orthopnea, or lower extremity edema. He denies dizziness, near syncope or syncope.   Primary Care Physician/Primary cardiologist: [place]  Last Lipid Profile:Lipid Panel     Component Value Date/Time   CHOL 147 11/25/2013 1035   TRIG 77.0 11/25/2013 1035   HDL 46.30 11/25/2013 1035   CHOLHDL 3 11/25/2013 1035   VLDL 15.4 11/25/2013 1035   [place] 85 11/25/2013 1035     Past Medical History  Diagnosis Date  . Cardiac arrest     aborted, atrial fibrillation/flutter  . Presence of automatic cardioverter/defibrillator (AICD)     Medtronic, remote-yes.   . Coronary artery disease   . Aortic stenosis   . Peripheral vascular disease   . Essential hypertension   . Hyperlipidemia   . Angina pectoris      Recurrent angina pectoris  . Claudication     Past Surgical History  Procedure Laterality Date  . Cabg      in 1982 and '84 with subsequent PTCI  . Appendectomy    . Tonsillectomy    .  Adenoidectomy    . Cataract surgery       Bilateral cataract surgery  . Left elbow tendon surgery    . Cardiac catheterization   11/16/2009   . Left and right heart catheterization with coronary/graft angiogram N/A 02/01/2014    Procedure: LEFT AND RIGHT HEART CATHETERIZATION WITH Beatrix Fetters;  Surgeon: Sinclair Grooms, MD;  Location: [facility] CATH LAB;  Service: Cardiovascular;  Laterality: N/A;    Current Outpatient Prescriptions  Medication Sig Dispense Refill  . amLODipine (NORVASC) 5 MG tablet TAKE 1 TABLET BY MOUTH EVERY EVENING. 90 tablet 2  . aspirin 81 MG tablet Take 81 mg by mouth daily.      . clopidogrel (PLAVIX) 75 MG tablet Take 75 mg by mouth daily.    . Coenzyme Q10 (CO Q 10 PO) Take 1 tablet by mouth daily.     . diazepam (VALIUM) 5 MG tablet Take 5 mg by mouth daily as needed for anxiety.    . furosemide (LASIX) 40 MG tablet TAKE 1 TABLET BY MOUTH DAILY. 90 tablet 0  . isosorbide mononitrate (IMDUR) 60 MG 24 hr tablet Take 60 mg by mouth every morning.    Marland Kitchen KRILL OIL PO Take 1 tablet by mouth daily.    Marland Kitchen latanoprost (XALATAN) 0.005 % ophthalmic solution Place 1 drop into both eyes at bedtime.     Marland Kitchen lisinopril (PRINIVIL,ZESTRIL) 20 MG tablet Take 20 mg by mouth 2 (two) times daily.    . Magnesium Oxide (MAG-OXIDE PO) Take 1 tablet by mouth daily.    . metoprolol (LOPRESSOR) 100 MG tablet Take 50 mg by mouth every morning.    . metoprolol (LOPRESSOR) 50 MG tablet Take 25 mg tablet in the evening    . Multiple Vitamin (MULTIVITAMIN) tablet Take 1 tablet by mouth daily.      . niacin (SLO-NIACIN) 500 MG tablet Take 500 mg by mouth at bedtime.      . nitroGLYCERIN (NITROSTAT) 0.4 MG SL tablet Place 1 tablet (0.4 mg total) under the tongue every 5 (five) minutes as needed. (Patient taking differently: Place 0.4 mg under the tongue every 5 (five) minutes as needed for chest pain (MAX 3 TABLETS). ) 25 tablet 1  . potassium chloride (K-DUR,KLOR-CON) 10 MEQ tablet Take 10 mEq by mouth 2 (two) times daily.      . rosuvastatin (CRESTOR) 20 MG tablet Take 20 mg by mouth daily.    . Thiamine HCl (VITAMIN B-1) 100 MG tablet Take 100 mg by mouth daily.       No current facility-administered medications for this visit.    Allergies  Allergen Reactions  .  Cyclobenzaprine Hcl     Unspecified reaction  . Tetracycline     Rash     History   Social History  . Marital Status: Married    Spouse Name: N/A    Number of Children: 3  . Years of Education: N/A   Occupational History  . Retired    Social History Main Topics  . Smoking status: Former Smoker    Quit date: 10/25/1952  . Smokeless tobacco: Not on file  . Alcohol Use: No  . Drug Use: No  . Sexual Activity: Not on file   Other Topics Concern  . Not on file   Social History Narrative    Family History  Problem Relation Age of Onset  . Coronary artery disease Other     family history is dignificant for early CAD in  several members  . Heart attack Father 19    Review of Systems:  As stated in the HPI and otherwise negative.   BP 114/66 mmHg  Pulse 71  Ht 5' 6.75" (1.695 m)  Wt 157 lb (71.215 kg)  BMI 24.79 kg/m2  Physical Examination: General: Well developed, well nourished, NAD HEENT: OP clear, mucus membranes moist SKIN: warm, dry. No rashes. Neuro: No focal deficits Musculoskeletal: Muscle strength 5/5 all ext Psychiatric: Mood and affect normal Neck: No JVD, no carotid bruits, no thyromegaly, no lymphadenopathy. Lungs:Clear bilaterally, no wheezes, rhonci, crackles Cardiovascular: Regular rate and rhythm. No murmurs, gallops or rubs. Abdomen:Soft. Bowel sounds present. Non-tender.  Extremities: No lower extremity edema. Pulses are 2 + in the bilateral DP/PT.   Transthoracic Echocardiography  Patient:  Benard, [place]urn MR #:    [id number] Study Date: 02/12/2014 Gender:   M Age:    89 Height:   167.6 cm Weight:   70.8 kg BSA:    1.83 m^2 Pt. Status: Room:  ATTENDING  Gilford Raid, MD [place], MD [place], MD PERFORMING  [facility], Outpatient SONOGRAPHER Darlina Sicilian, RDCS  cc:  ------------------------------------------------------------------- LV EF: 55% -   60%  ------------------------------------------------------------------- Indications:   Aortic Stenosis (I35.0).  ------------------------------------------------------------------- History:  PMH: Ischemic Cardiomyopathy. Atrial fibrillation. Aortic valve disease.  ------------------------------------------------------------------- Study Conclusions  - Left ventricle: E/e&quot;>16.9 suggestive of elevated LV filling pressures. The cavity size was mildly dilated. Wall thickness was normal. Systolic function was normal. The estimated ejection fraction was in the range of 55% to 60%. Wall motion was normal; there were no regional wall motion abnormalities. There was an increased relative contribution of atrial contraction to ventricular filling. Doppler parameters are consistent with abnormal left ventricular relaxation (grade 1 diastolic dysfunction). - Aortic valve: Severely calcified annulus. Trileaflet. Severe thickening and calcification. Cusp separation was reduced. There was moderate stenosis. There was mild regurgitation. - Left atrium: The atrium was mildly dilated. - Right ventricle: The cavity size was mildly dilated. Wall thickness was normal.  ------------------------------------------------------------------- Labs, prior tests, procedures, and surgery: Coronary artery bypass grafting.  Transthoracic echocardiography. M-mode, complete 2D, spectral Doppler, and color Doppler. Birthdate: Patient birthdate: 1933-04-13. Age: Patient is 80 yr old. Sex: Gender: male. BMI: 25.2 kg/m^2. Blood pressure:   120/64 Patient status: Inpatient. Study date: Study date: 02/12/2014. Study time: 02:10 PM. Location: Echo laboratory.  -------------------------------------------------------------------  ------------------------------------------------------------------- Left ventricle: E/e&quot;>16.9 suggestive of elevated LV  filling pressures. The cavity size was mildly dilated. Wall thickness was normal. Systolic function was normal. The estimated ejection fraction was in the range of 55% to 60%. Wall motion was normal; there were no regional wall motion abnormalities. There was an increased relative contribution of atrial contraction to ventricular filling. Doppler parameters are consistent with abnormal left ventricular relaxation (grade 1 diastolic dysfunction).  ------------------------------------------------------------------- Aortic valve: AV is thickened, calcified with moderately restricted motion Peak and mean gradients through the vlave are 50 and 30 mm Hg respectively consistent with moderate AS. Severely calcified annulus. Trileaflet. Severe thickening and calcification. Cusp separation was reduced. Doppler:  There was moderate stenosis.  There was mild regurgitation.  VTI ratio of LVOT to aortic valve: 0.28. Valve area (VTI): 0.87 cm^2. Indexed valve area (VTI): 0.48 cm^2/m^2. Valve area (Vmax): 0.93 cm^2. Indexed valve area (Vmax): 0.51 cm^2/m^2. Mean velocity ratio of LVOT to aortic valve: 0.23. Valve area (Vmean): 0.71 cm^2. Indexed valve area (Vmean): 0.39 cm^2/m^2.  Mean gradient (S): 29 mm Hg. Peak gradient (S):  50 mm Hg.  ------------------------------------------------------------------- Mitral valve:  Calcified annulus. Mildly thickened leaflets . Doppler: There was trivial regurgitation.  Peak gradient (D): 3 mm Hg.  ------------------------------------------------------------------- Left atrium: The atrium was mildly dilated.  ------------------------------------------------------------------- Right ventricle: The cavity size was mildly dilated. Wall thickness was normal. Pacer wire or catheter noted in right ventricle. Systolic function was normal.  ------------------------------------------------------------------- Pulmonic valve:  Structurally normal  valve.  Cusp separation was normal. Doppler: Transvalvular velocity was within the normal range. There was trivial regurgitation.  ------------------------------------------------------------------- Tricuspid valve:  Structurally normal valve.  Leaflet separation was normal. Doppler: Transvalvular velocity was within the normal range. There was mild regurgitation.  ------------------------------------------------------------------- Right atrium: The atrium was normal in size. Pacer wire or catheter noted in right atrium.  ------------------------------------------------------------------- Pericardium: A trivial pericardial effusion was identified posterior to the heart.  ------------------------------------------------------------------- Measurements  Left ventricle              Value     Reference LV ID, ED, PLAX chordal      (H)   52.6 mm    43 - 52 LV ID, ES, PLAX chordal          36.4 mm    23 - 38 LV fx shortening, PLAX chordal      31  %    >=29 LV PW thickness, ED            8.81 mm    --------- IVS/LV PW ratio, ED            0.93      <=1.3 Stroke volume, 2D             72  ml    --------- Stroke volume/bsa, 2D           39  ml/m^2  --------- LV e&', lateral              5   cm/s   --------- LV E/e&', lateral             16.88     --------- LV e&', medial               5.55 cm/s   --------- LV E/e&', medial              15.21     --------- LV e&', average              5.28 cm/s   --------- LV E/e&', average             16       ---------  Ventricular septum            Value     Reference IVS thickness, ED             8.19 mm    ---------  LVOT                   Value      Reference LVOT ID, S                20  mm    --------- LVOT area                 3.14 cm^2   --------- LVOT peak velocity, S           105  cm/s   --------- LVOT mean velocity, S           58.2 cm/s   --------- LVOT VTI,  S                23  cm    --------- LVOT peak gradient, S           4   mm Hg  ---------  Aortic valve               Value     Reference Aortic valve mean velocity, S       256  cm/s   --------- Aortic valve VTI, S            82.6 cm    --------- Aortic mean gradient, S          29  mm Hg  --------- Aortic peak gradient, S          50  mm Hg  --------- VTI ratio, LVOT/AV            0.28      --------- Aortic valve area, VTI          0.87 cm^2   --------- Aortic valve area/bsa, VTI        0.48 cm^2/m^2 --------- Aortic valve area, peak velocity     0.93 cm^2   --------- Aortic valve area/bsa, peak        0.51 cm^2/m^2 --------- velocity Velocity ratio, mean, LVOT/AV       0.23      --------- Aortic valve area, mean velocity     0.71 cm^2   --------- Aortic valve area/bsa, mean        0.39 cm^2/m^2 --------- velocity  Aorta                   Value     Reference Aortic root ID, ED            29  mm    ---------  Left atrium                Value     Reference LA ID, A-P, ES              47  mm    --------- LA ID/bsa, A-P          (H)   2.57 cm/m^2  <=2.2 LA volume, S               63.2 ml    --------- LA volume/bsa, S             34.6 ml/m^2  --------- LA volume, ES, 1-p A4C          62  ml    --------- LA volume/bsa, ES, 1-p A4C        34   ml/m^2  --------- LA volume, ES, 1-p A2C          56.7 ml    --------- LA volume/bsa, ES, 1-p A2C        31  ml/m^2  ---------  Mitral valve               Value     Reference Mitral E-wave peak velocity        84.4 cm/s   --------- Mitral A-wave peak velocity        97.8 cm/s   --------- Mitral deceleration time         201  ms    150 - 230 Mitral peak gradient, D          3   mm Hg  --------- Mitral  E/A ratio, peak          0.9      ---------  Pulmonary arteries            Value     Reference PA pressure, S, DP            19  mm Hg  <=30  Tricuspid valve              Value     Reference Tricuspid regurg peak velocity      198  cm/s   --------- Tricuspid peak RV-RA gradient       16  mm Hg  ---------  Systemic veins              Value     Reference Estimated CVP               3   mm Hg  ---------  Right ventricle              Value     Reference RV pressure, S, DP            19  mm Hg  <=30 RV s&', lateral, S             4.38 cm/s   ---------  Legend: (L) and (H) mark values outside specified reference range.  ------------------------------------------------------------------- Prepared and Electronically Authenticated by  Fransico Him, MD 2015-11-06T23:35:16       Left Heart Catheterization with Coronary Angiography , Bypass Angio, and Abdominal Angiogram Report  ESVIN HNAT  80 y.o.  male March 25, 1934  Procedure Date: 02/01/2014 Referring Physician: Darlin Coco, M.D. Primary Cardiologist: Darlin Coco, M.D.  INDICATIONS: Progressive angina and severe aortic stenosis  PROCEDURE: 1. Left heart catheterization; 2. Left ventriculography; 3. Coronary angiography; 4.  Bypass graft angiography; 5. Left internal mammary graft angiography; 6. Abdominal aortography  CONSENT:  The risks, benefits, and details of the procedure were explained in detail to the patient. Risks including death, stroke, heart attack, kidney injury, allergy, limb ischemia, bleeding and radiation injury were discussed. The patient verbalized understanding and wanted to proceed. Informed written consent was obtained.  PROCEDURE TECHNIQUE: After Xylocaine anesthesia a 5 French sheath was placed in the right femoral artery using the modified Seldinger technique. A 7 French sheath was placed in the right femoral vein using the Seldinger technique. 2% Xylocaine was used for local anesthesia. Coronary angiography was done using a 5 F a to MP, JR4, JL 3.5 cm, and Internal Mammary diagnostic catheters. Left ventriculography was done using the JR4 catheter and hand injection. Abdominal aortography was performed using an angled pigtail catheter and power injection at 18 cc/s for a total of 40 cc of contrast.  Digital images were reviewed. Hemodynamics were reviewed. The case was terminated. Manual compression was used for hemostasis.  CONTRAST: Total of 139 cc.  COMPLICATIONS: None   HEMODYNAMICS: Aortic pressure 112/54 mmHg; LV pressure 143 over 9 mmHg; LVEDP 16 mmHg; RA 5 mmHg; RV 24 over 8 mmHg; PA 26 over 13 mmHg; PCWP(mean) 10 mmHg; Cardiac Output Fick 4.97 L/m; thermodilution 3.62 L/m; AV gradient 31 mmHg peak to peak and 26 mmHg mean gradients; aortic valve area using Fick cardiac output 1.06 cm; aortic valve area using the thermodilution cardiac output 0.77 cm.  ANGIOGRAPHIC DATA: The left main coronary artery is heavily calcified and totally occluded.  The left anterior descending artery is totally occluded and supplied only by the left internal mammary.  The left  circumflex artery is totally occluded.  The right coronary artery is totally occluded at the ostium.  BYPASS  GRAFT ANGIOGRAPHY: Saphenous vein graft to the obtuse marginal: Widely patent with luminal irregularities. A proximal/ostial stent and a mid body stent is widely patent.  Saphenous vein graft to the right coronary: contains luminal irregularities but no significant obstruction is noted  Internal mammary graft to the LAD: Widely patent. The native left anterior descending is severely and diffusely diseased. There is 90% stenosis antegrade and also 90% stenosis retrograde. The apical LAD is also severely diseased. Collaterals to the right ventricular branch of the RCA arise from the LAD.  ABDOMINAL AORTOGRAPHY: Demonstrated 70% right renal artery stenosis. 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 There is 40-50% ostial left renal artery stenosis. The left internal iliac contains 90% stenosis. The right internal iliac is also 50% narrowing.   LEFT VENTRICULOGRAM: Left ventricular angiogram was done in the 30 RAO projection and revealed a normal left ventricular cavity size with an estimated ejection fraction of 35-40%. No obvious mitral regurgitation was noted.   IMPRESSIONS: 1. Patent bypass grafts, very similar to the last heart catheterization in 2011. The grafts include a saphenous vein graft to the right coronary, saphenous vein graft to the obtuse marginal. The left internal mammary graft to the LAD is widely patent. 2. The left anterior descending is severely and diffusely diseased, both antegrade and retrograde from the graft insertion site. This is likely the source of the patient's anginal complaints. No focal sites for intervention exist. 3. Severe aortic stenosis, calcific 4. Left ventricular systolic dysfunction with ejection fraction 35-40%. No obvious mitral regurgitation is noted.   RECOMMENDATION: Preferred a heart valve clinic for consideration of TAVR.Marland Kitchen            Cardiac TAVR CT  TECHNIQUE: The patient was scanned on a Philips 256 scanner. A 120  kV retrospective scan was triggered in the descending thoracic aorta at 111 HU's. Gantry rotation speed was 270 msecs and collimation was .9 mm. No beta blockade or nitro were given. The 3D data set was reconstructed in 5% intervals of the R-R cycle. Systolic and diastolic phases were analyzed on a dedicated work station using MPR, MIP and VRT modes. The patient received 80 cc of contrast.  Note IR Radiology needed to start appropriate IV for case  FINDINGS: Aortic Valve: Trileaflet and heavily calcified.  Sinotubular Junction: 2.5 cm with heavy calcification  Ascending Thoracic Aorta: 3.1 cm  Aortic Arch: 2.7 cm  Descending Thoracic Aorta: 2.6 cm  Sinus of Valsalva Measurements:  Non-coronary: 29.5 mm  Right -coronary: 29 mm  Left -coronary: 30.8 mm  Coronary Artery Height above Annulus:  Left Main: 13.6 mm  Right Coronary: 14.8 mm  Virtual Basal Annulus Measurements:  Maximum/Minimum Diameter: 29.5 x 21 mm  Perimeter: 80 mm  Area: 466 mm2  Coronary Arteries: The native coronary arteries are occluded The ostium are heavily calcified The SVG to RCA is patent. The SVG to OM is patent and appears to be stented. The LIMA to the LAD is patent  Optimum Fluoroscopic Angle for Delivery: LAO 3 degrees Cranial 1 degree  IMPRESSION: 1) Heavily calcified trileaflet aortic valve suitable for 26 mm Sapien XT valve  2) Unfavorable characteristics for TAVR include large area of calcification of the sinotubular junction. Heavy annular calcification between the non and left coronary cusps and dense extensive  Subvalvular calcification extending into the intervalvular fibrosa and base of anterior mitral leaflet  3)  Coronary heights sufficient for deployment but circulation fed by grafts anyway  4) Moderate calcification of aortic Kartch with no large atheroma and normal origin of great vessels  5) Optimum angiographic angle for  delivery LAO 3 degrees and Cranial 1 degree  6) Occluded native arteries with patient SVG RCA and OM and patent LIMA to LAD  Jenkins Rouge   Electronically Signed  By: Jenkins Rouge M.D.  On: 03/12/2014 14:58      Study Result     EXAM: OVER-READ INTERPRETATION CT CHEST  The following report is an over-read performed by radiologist Dr. Rebekah [facility] on 03/11/2014. This over-read does not include interpretation of cardiac or coronary anatomy or pathology. The coronary calcium score/coronary CTA interpretation by the cardiologist is attached.  COMPARISON: Chest CT 04/24/2010.  FINDINGS: Within the visualized portions of the thorax there are no suspicious appearing pulmonary nodules or masses, there is no acute consolidative airspace disease, no pneumothorax and no pleural effusion. No pathologically enlarged mediastinal or hilar lymph nodes are noted. Small hiatal hernia. Visualized portions of the upper abdomen are unremarkable. There are no aggressive appearing lytic or blastic lesions noted in the visualized portions of the skeleton. Post vertebroplasty changes are noted in the T8 vertebral body.  IMPRESSION: 1. No significant incidental noncardiac findings noted. 2. Incidental findings, as above.  Electronically Signed: By: Vinnie Langton M.D. On: 03/11/2014 16:23     CTA ABDOMEN AND PELVIS WITH CONTRAST  TECHNIQUE: Multidetector CT imaging of the abdomen and pelvis was performed using the standard protocol during bolus administration of intravenous contrast. Multiplanar reconstructed images and MIPs were obtained and reviewed to evaluate the vascular anatomy.  CONTRAST: 52mL OMNIPAQUE IOHEXOL 350 MG/ML SOLN  COMPARISON: None.  FINDINGS: CTA ABDOMEN AND PELVIS FINDINGS  Lower chest: Pacemaker lead in the right ventricular apex. Atherosclerotic calcifications in the right coronary artery.  Median sternotomy wires. Small hiatal hernia.  Hepatobiliary: Well-defined 2.1 x 2.2 cm low-attenuation lesion in segment 4B of the liver adjacent to the gallbladder fossa, compatible with a simple cyst. Remaining portions of the visualized liver (dome of the right lobe of the liver was incompletely visualized) are normal in appearance. No intra or extrahepatic biliary ductal dilatation. Gallbladder is normal in appearance.  Pancreas: Unremarkable.  Spleen: Unremarkable.  Adrenals/Urinary Tract: Bilateral adrenal glands are normal in appearance. There are multiple subcentimeter low attenuation lesions in the kidneys bilaterally which are too small to definitively characterize (statistically favored to represent small cysts). In addition, there are multiple larger well-defined low-attenuation nonenhancing lesions, compatible with simple cysts, the largest of which is exophytic measuring 5.2 cm in the anterior aspect of the upper pole of the left kidney. No hydroureteronephrosis. Urinary bladder is unremarkable in appearance.  Stomach/Bowel: The appearance of the stomach is normal. Large diverticulum from the second portion of the duodenum incidentally noted. No pathologic dilatation of small bowel or colon. Numerous colonic diverticulae are, particularly in the sigmoid colon, without surrounding inflammatory changes to suggest an acute diverticulitis at this time.  Vascular/Lymphatic: Vascular findings and measurements pertinent to potential TAVR procedure, as detailed below. In addition, there is a stent in the proximal left superficial femoral artery which is grossly patent (but incompletely visualized). No definite lymphadenopathy in the abdomen or pelvis.  Reproductive: Prostate gland and seminal vesicles are unremarkable in appearance.  Other: Small amount of abnormal soft tissue in the left lower quadrant immediately adjacent to the left inguinal ring, with  some soft tissue thickening tracking into  the left inguinal canal, strongly favored to represent a postoperative change related to prior left inguinal herniorrhaphy. No significant volume of ascites. No pneumoperitoneum.  Musculoskeletal: There are no aggressive appearing lytic or blastic lesions noted in the visualized portions of the skeleton.  VASCULAR MEASUREMENTS PERTINENT TO TAVR:  AORTA:  Minimal Aortic Diameter - 14 x 12 mm  Severity of Aortic Calcification - Severe and circumferential  RIGHT PELVIS:  Right Common Iliac Artery -  Minimal Diameter - 4.7 x 2.2 mm  Tortuosity - moderate  Calcification - severe  Right External Iliac Artery -  Minimal Diameter - 6.6 x 7.3 mm  Tortuosity - mild  Calcification - mild  Right Common Femoral Artery -  Minimal Diameter - 6.8 x 6.8 mm  Tortuosity - mild  Calcification - moderate  LEFT PELVIS:  Left Common Iliac Artery -  Minimal Diameter - 8.6 x 4.7 mm  Tortuosity - moderate  Calcification - severe  Left External Iliac Artery -  Minimal Diameter - 7.6 x 7.0 mm  Tortuosity - mild  Calcification - mild  Left Common Femoral Artery -  Minimal Diameter - 5.8 x 5.6 mm  Tortuosity - mild  Calcification - moderate  Review of the MIP images confirms the above findings.  IMPRESSION: 1. Vascular findings and measurements pertinent to potential TAVR procedure, as detailed above. This patient does not have suitable pelvic arterial access on the right side. On the left side, depending on the location of cannulation of the left femoral artery, vascular access may be reasonable (the superior aspect of the left common femoral artery is greater than 6 mm average diameter, but the mean diameter in the distal left common femoral artery is only 5.7 mm). However, there is extensive atherosclerosis in the left common iliac artery with nearly circumferential calcification and  mean diameter of only 6.6 mm. Clinical correlation based on planned size of transcatheter aortic valve implant is recommended. 2. Additional incidental findings, as above.   Electronically Signed  By: Vinnie Langton M.D.  On: 03/11/2014 17:09    Pulmonary Function Tests:  Results for KAHEEM, HALLECK (MRN [id number]) as of 03/15/2014 13:13  Ref. Range 03/09/2014 11:18  FVC-Pre No range found 2.72  FVC-%Pred-Pre No range found 90  FEV1-Pre No range found 2.33  FEV1-%Pred-Pre No range found 111  Pre FEV1/FVC ratio No range found 86  FEV1FVC-%Pred-Pre No range found 119  FEF 25-75 Pre No range found 3.19  FEF2575-%Pred-Pre No range found 220  FEV6-Pre No range found 2.71  FEV6-%Pred-Pre No range found 98  Pre FEV6/FVC Ratio No range found 100  FEV6FVC-%Pred-Pre No range found 108  FVC-Post No range found 2.75  FVC-%Pred-Post No range found 92  FVC-%Change-Post No range found 1  FEV1-Post No range found 2.37  FEV1-%Pred-Post No range found 112  FEV1-%Change-Post No range found 1  Post FEV1/FVC ratio No range found 86  FEV1FVC-%Change-Post No range found 0  FEF 25-75 Post No range found 3.50  FEF2575-%Pred-Post No range found 241  FEF2575-%Change-Post No range found 9  FEV6-Post No range found 2.75  FEV6-%Pred-Post No range found 100  FEV6-%Change-Post No range found 1  Post FEV6/FVC ratio No range found 100  FEV6FVC-%Pred-Post No range found 108  FEV6FVC-%Change-Post No range found 0  TLC No range found 5.29  TLC % pred No range found 93  RV No range found 2.23  RV % pred No range found 99  DLCO cor No range found 17.64  DLCO cor % pred  No range found 76  DLCO unc No range found 17.64  DLCO unc % pred No range found 76  DL/VA No range found 4.18  DL/VA % pred No range found 103     STS Risk  Calculator  Procedure3rd time redo CABG + AVR  Risk of Mortality7.8% Morbidity or Mortality36.7% Prolonged LOS15.9% Short LOS18.3% Permanent Stroke3.3% Prolonged Vent Support24.5% DSW Infection0.4% Renal Failure11.3% Reoperation14.4%   Assessment and Plan:   1. Severe Aortic valve stenosis: He has severe symptomatic aortic valve stenosis. He is felt to be high risk for third open chest procedure for AVR. He has been seen by Dr. Roxy Manns and Dr. Cyndia Bent with CT surgery. I agree that the TAVR approach to his AVR is most appropriate. He agrees to proceed. Will plan left femoral approach. Will use the Sapien 3 system and likely a 26 mm Sapien 3 valve. All testing as above has been reviewed personally by me. I have reviewed his echo images, cath films, CTA and cardiac CT. His TAVR is planned for 03/23/14 at [facility]. He is now holding Plavix. His pre-op testing is tomorrow. All questions answered today.

## 2014-03-19 ENCOUNTER — Ambulatory Visit (HOSPITAL_COMMUNITY)
Admission: RE | Admit: 2014-03-19 | Discharge: 2014-03-19 | Disposition: A | Discharge status: Home / Self Care | Payer: Medicare Other | Source: Ambulatory Visit | Attending: Cardiovascular Disease | Admitting: Cardiovascular Disease

## 2014-03-19 ENCOUNTER — Encounter (HOSPITAL_COMMUNITY): Payer: Self-pay

## 2014-03-19 ENCOUNTER — Encounter (HOSPITAL_COMMUNITY)
Admission: RE | Admit: 2014-03-19 | Discharge: 2014-03-19 | Disposition: A | Discharge status: Home / Self Care | Payer: Medicare Other | Source: Ambulatory Visit | Attending: Cardiovascular Disease | Admitting: Cardiovascular Disease

## 2014-03-19 VITALS — BP 103/51 | HR 70 | Temp 97.9°F | Resp 20 | Ht 66.75 in | Wt 158.3 lb

## 2014-03-19 DIAGNOSIS — I350 Nonrheumatic aortic (valve) stenosis: Secondary | ICD-10-CM

## 2014-03-19 DIAGNOSIS — Z01818 Encounter for other preprocedural examination: Secondary | ICD-10-CM | POA: Insufficient documentation

## 2014-03-19 HISTORY — DX: Calculus of kidney: N20.0

## 2014-03-19 HISTORY — DX: Headache, unspecified: R51.9

## 2014-03-19 HISTORY — DX: Unspecified osteoarthritis, unspecified site: M19.90

## 2014-03-19 HISTORY — DX: Headache: R51

## 2014-03-19 HISTORY — DX: Personal history of other diseases of the digestive system: Z87.19

## 2014-03-19 LAB — BLOOD GAS, ARTERIAL
Acid-Base Excess: 1.4 mmol/L (ref 0.0–2.0)
Bicarbonate: 25.5 mEq/L — ABNORMAL HIGH (ref 20.0–24.0)
Drawn by: 206361
FIO2: 0.21 %
O2 Saturation: 95.7 %
Patient temperature: 98.6
TCO2: 26.7 mmol/L (ref 0–100)
pCO2 arterial: 40 mmHg (ref 35.0–45.0)
pH, Arterial: 7.42 (ref 7.350–7.450)
pO2, Arterial: 79.9 mmHg — ABNORMAL LOW (ref 80.0–100.0)

## 2014-03-19 LAB — URINALYSIS, ROUTINE W REFLEX MICROSCOPIC
Bilirubin Urine: NEGATIVE
Glucose, UA: NEGATIVE mg/dL
Hgb urine dipstick: NEGATIVE
Ketones, ur: NEGATIVE mg/dL
Leukocytes, UA: NEGATIVE
Nitrite: NEGATIVE
Protein, ur: NEGATIVE mg/dL
Specific Gravity, Urine: 1.016 (ref 1.005–1.030)
Urobilinogen, UA: 0.2 mg/dL (ref 0.0–1.0)
pH: 6 (ref 5.0–8.0)

## 2014-03-19 LAB — COMPREHENSIVE METABOLIC PANEL
ALT: 13 U/L (ref 0–53)
AST: 18 U/L (ref 0–37)
Albumin: 3.7 g/dL (ref 3.5–5.2)
Alkaline Phosphatase: 48 U/L (ref 39–117)
Anion gap: 14 (ref 5–15)
BUN: 25 mg/dL — ABNORMAL HIGH (ref 6–23)
CO2: 21 mEq/L (ref 19–32)
Calcium: 10.3 mg/dL (ref 8.4–10.5)
Chloride: 104 mEq/L (ref 96–112)
Creatinine, Ser: 0.94 mg/dL (ref 0.50–1.35)
GFR calc Af Amer: >90 mL/min (ref >90)
GFR calc non Af Amer: 78 mL/min — ABNORMAL LOW (ref >90)
Glucose, Bld: 110 mg/dL — ABNORMAL HIGH (ref 70–99)
Potassium: 4.8 mEq/L (ref 3.7–5.3)
Sodium: 139 mEq/L (ref 137–147)
Total Bilirubin: 0.4 mg/dL (ref 0.3–1.2)
Total Protein: 6.9 g/dL (ref 6.0–8.3)

## 2014-03-19 LAB — CBC
HEMATOCRIT: 38.9 % — AB (ref 39.0–52.0)
Hemoglobin: 13.3 g/dL (ref 13.0–17.0)
MCH: 31.4 pg (ref 26.0–34.0)
MCHC: 34.2 g/dL (ref 30.0–36.0)
MCV: 92 fL (ref 78.0–100.0)
Platelets: 124 10*3/uL — ABNORMAL LOW (ref 150–400)
RBC: 4.23 MIL/uL (ref 4.22–5.81)
RDW: 13.2 % (ref 11.5–15.5)
WBC: 5.7 10*3/uL (ref 4.0–10.5)

## 2014-03-19 LAB — APTT: aPTT: 31 seconds (ref 24–37)

## 2014-03-19 LAB — SURGICAL PCR SCREEN
MRSA, PCR: NEGATIVE
Staphylococcus aureus: POSITIVE — AB

## 2014-03-19 LAB — PROTIME-INR
INR: 1.09 (ref 0.00–1.49)
Prothrombin Time: 14.2 seconds (ref 11.6–15.2)

## 2014-03-19 NOTE — Progress Notes (Signed)
   03/19/14 1401  OBSTRUCTIVE SLEEP APNEA  Have you ever been diagnosed with sleep apnea through a sleep study? No  Do you snore loudly (loud enough to be heard through closed doors)?  0  Do you often feel tired, fatigued, or sleepy during the daytime? 0  Has anyone observed you stop breathing during your sleep? 1  Do you have, or are you being treated for high blood pressure? 1  BMI more than 35 kg/m2? 0  Age over 78 years old? 1  Neck circumference greater than 40 cm/16 inches? 0  Gender: 1  Obstructive Sleep Apnea Score 4  Score 4 or greater  Results sent to PCP

## 2014-03-19 NOTE — Pre-Procedure Instructions (Addendum)
[name]  03/19/2014   Your procedure is scheduled on:  Dec. 15 at 1114  Report to [facility] Admitting at 0900 AM.  Call this number if you have problems the morning of surgery: [phone]   Remember:   Do not eat food or drink liquids after midnight.   Take these medicines the morning of surgery with A SIP OF WATER: amlodipine(Norvasc), Isosorbide mononitrate(Imdur), Toprol (Metoprolol) Take Plavix as directed by your Dr.  Stop taking Herbal medications,  Fish Oil, BC's, Goody's, Ibuprofen, Aleve, Aspirin.   Do not wear jewelry, make-up or nail polish.  Do not wear lotions, powders, or perfumes. You may wear deodorant.  Do not shave 48 hours prior to surgery. Men may shave face and neck.  Do not bring valuables to the hospital.  [facility] is not responsible  for any belongings or valuables.               Contacts, dentures or bridgework may not be worn into surgery.  Leave suitcase in the car. After surgery it may be brought to your room.  For patients admitted to the hospital, discharge time is determined by your                treatment team.               Patients discharged the day of surgery will not be allowed to drive  home.    Special Instructions: [facility] - Preparing for Surgery  Before surgery, you can play an important role.  Because skin is not sterile, your skin needs to be as free of germs as possible.  You can reduce the number of germs on you skin by washing with CHG (chlorahexidine gluconate) soap before surgery.  CHG is an antiseptic cleaner which kills germs and bonds with the skin to continue killing germs even after washing.  Please DO NOT use if you have an allergy to CHG or antibacterial soaps.  If your skin becomes reddened/irritated stop using the CHG and inform your nurse when you arrive at Short Stay.  Do not shave (including legs and underarms) for at least 48 hours prior to the first CHG shower.  You may shave your  face.  Please follow these instructions carefully:   1.  Shower with CHG Soap the night before surgery and the                                morning of Surgery.  2.  If you choose to wash your hair, wash your hair first as usual with your       normal shampoo.  3.  After you shampoo, rinse your hair and body thoroughly to remove the                      Shampoo.  4.  Use CHG as you would any other liquid soap.  You can apply chg directly       to the skin and wash gently with scrungie or a clean washcloth.  5.  Apply the CHG Soap to your body ONLY FROM THE NECK DOWN.        Do not use on open wounds or open sores.  Avoid contact with your eyes,       ears, mouth and genitals (private parts).  Wash genitals (private parts)  with your normal soap.  6.  Wash thoroughly, paying special attention to the area where your surgery        will be performed.  7.  Thoroughly rinse your body with warm water from the neck down.  8.  DO NOT shower/wash with your normal soap after using and rinsing off       the CHG Soap.  9.  Pat yourself dry with a clean towel.            10.  Wear clean pajamas.            11.  Place clean sheets on your bed the night of your first shower and do not        sleep with pets.  Day of Surgery  Do not apply any lotions/deoderants the morning of surgery.  Please wear clean clothes to the hospital/surgery center.      Please read over the following fact sheets that you were given: Pain Booklet, Coughing and Deep Breathing, Blood Transfusion Information, MRSA Information and Surgical Site Infection Prevention

## 2014-03-19 NOTE — Progress Notes (Signed)
Mupirocin ointment Rx called into [facility] and spoke with Legrand Como. Positive PCR. Called and spoke with pt's daughter and gave her message. She states she will give her father the message.

## 2014-03-19 NOTE — Progress Notes (Signed)
PCP and cardiologist is Dr  Darlin Coco Dr  Virl Axe takes care of his ICD. Echo, stress test and card cath noted in epic.

## 2014-03-20 LAB — HEMOGLOBIN A1C
Hgb A1c MFr Bld: 5.9 % — ABNORMAL HIGH (ref <5.7)
Mean Plasma Glucose: 123 mg/dL — ABNORMAL HIGH (ref <117)

## 2014-03-22 MED ORDER — DEXTROSE 5 % IV SOLN
1.5000 g | INTRAVENOUS | Status: AC
  Administered 2014-03-23: 1.5 g via INTRAVENOUS
  Filled 2014-03-22 (×2): qty 1.5

## 2014-03-22 MED ORDER — DEXMEDETOMIDINE HCL IN NACL 400 MCG/100ML IV SOLN
0.1000 ug/kg/h | INTRAVENOUS | Status: DC
  Filled 2014-03-22: qty 100

## 2014-03-22 MED ORDER — PHENYLEPHRINE HCL 10 MG/ML IJ SOLN
30.0000 ug/min | INTRAVENOUS | Status: AC
  Administered 2014-03-23: 20 ug/min via INTRAVENOUS
  Filled 2014-03-22: qty 2

## 2014-03-22 MED ORDER — DOPAMINE-DEXTROSE 3.2-5 MG/ML-% IV SOLN
0.0000 ug/kg/min | INTRAVENOUS | Status: DC
  Filled 2014-03-22: qty 250

## 2014-03-22 MED ORDER — CHLORHEXIDINE GLUCONATE 4 % EX LIQD
60.0000 mL | Freq: Once | CUTANEOUS | Status: DC
  Filled 2014-03-22: qty 60

## 2014-03-22 MED ORDER — MAGNESIUM SULFATE 50 % IJ SOLN
40.0000 meq | INTRAMUSCULAR | Status: DC
  Filled 2014-03-22: qty 10

## 2014-03-22 MED ORDER — SODIUM CHLORIDE 0.9 % IV SOLN
INTRAVENOUS | Status: DC
  Filled 2014-03-22: qty 2.5

## 2014-03-22 MED ORDER — SODIUM CHLORIDE 0.9 % IV SOLN: INTRAVENOUS | Status: DC

## 2014-03-22 MED ORDER — SODIUM CHLORIDE 0.9 % IV SOLN
INTRAVENOUS | Status: DC
  Filled 2014-03-22: qty 30

## 2014-03-22 MED ORDER — EPINEPHRINE HCL 1 MG/ML IJ SOLN
0.0000 ug/min | INTRAVENOUS | Status: DC
  Filled 2014-03-22: qty 4

## 2014-03-22 MED ORDER — METOPROLOL TARTRATE 12.5 MG HALF TABLET: 12.5000 mg | ORAL_TABLET | Freq: Once | ORAL | Status: DC

## 2014-03-22 MED ORDER — PLASMA-LYTE 148 IV SOLN
INTRAVENOUS | Status: DC
  Filled 2014-03-22: qty 2.5

## 2014-03-22 MED ORDER — CHLORHEXIDINE GLUCONATE 4 % EX LIQD
30.0000 mL | CUTANEOUS | Status: DC
  Filled 2014-03-22: qty 30

## 2014-03-22 MED ORDER — VANCOMYCIN HCL 10 G IV SOLR
1250.0000 mg | INTRAVENOUS | Status: AC
  Administered 2014-03-23: 1250 mg via INTRAVENOUS
  Filled 2014-03-22 (×2): qty 1250

## 2014-03-22 MED ORDER — NITROGLYCERIN IN D5W 200-5 MCG/ML-% IV SOLN
2.0000 ug/min | INTRAVENOUS | Status: DC
  Filled 2014-03-22: qty 250

## 2014-03-22 MED ORDER — DEXTROSE 5 % IV SOLN
750.0000 mg | INTRAVENOUS | Status: DC
  Filled 2014-03-22: qty 750

## 2014-03-22 MED ORDER — SODIUM CHLORIDE 0.9 % IV SOLN
INTRAVENOUS | Status: DC
  Filled 2014-03-22: qty 40

## 2014-03-22 MED ORDER — POTASSIUM CHLORIDE 2 MEQ/ML IV SOLN
80.0000 meq | INTRAVENOUS | Status: DC
  Filled 2014-03-22: qty 40

## 2014-03-22 NOTE — Progress Notes (Signed)
Spoke with [name] with [employer] she is aware of procedure and will be here to program device as needed.

## 2014-03-23 ENCOUNTER — Inpatient Hospital Stay (HOSPITAL_COMMUNITY): Payer: Medicare Other | Admitting: Certified Registered"

## 2014-03-23 ENCOUNTER — Inpatient Hospital Stay (HOSPITAL_COMMUNITY): Payer: Medicare Other

## 2014-03-23 ENCOUNTER — Encounter (HOSPITAL_COMMUNITY): Payer: Self-pay | Admitting: *Deleted

## 2014-03-23 ENCOUNTER — Encounter (HOSPITAL_COMMUNITY)
Admission: RE | Disposition: A | Discharge status: Home / Self Care | Payer: Medicare Other | Source: Ambulatory Visit | Attending: Cardiovascular Disease

## 2014-03-23 ENCOUNTER — Inpatient Hospital Stay (HOSPITAL_COMMUNITY)
Admission: RE | Admit: 2014-03-23 | Discharge: 2014-03-26 | DRG: 267 | Disposition: A | Discharge status: Home / Self Care | Payer: Medicare Other | Source: Ambulatory Visit | Attending: Cardiovascular Disease | Admitting: Cardiovascular Disease

## 2014-03-23 DIAGNOSIS — Z953 Presence of xenogenic heart valve: Secondary | ICD-10-CM

## 2014-03-23 DIAGNOSIS — Z87891 Personal history of nicotine dependence: Secondary | ICD-10-CM | POA: Diagnosis not present

## 2014-03-23 DIAGNOSIS — I10 Essential (primary) hypertension: Secondary | ICD-10-CM | POA: Diagnosis not present

## 2014-03-23 DIAGNOSIS — Z794 Long term (current) use of insulin: Secondary | ICD-10-CM | POA: Diagnosis not present

## 2014-03-23 DIAGNOSIS — Z952 Presence of prosthetic heart valve: Secondary | ICD-10-CM | POA: Diagnosis not present

## 2014-03-23 DIAGNOSIS — Z951 Presence of aortocoronary bypass graft: Secondary | ICD-10-CM

## 2014-03-23 DIAGNOSIS — I739 Peripheral vascular disease, unspecified: Secondary | ICD-10-CM | POA: Diagnosis not present

## 2014-03-23 DIAGNOSIS — E119 Type 2 diabetes mellitus without complications: Secondary | ICD-10-CM | POA: Diagnosis not present

## 2014-03-23 DIAGNOSIS — I369 Nonrheumatic tricuspid valve disorder, unspecified: Secondary | ICD-10-CM | POA: Diagnosis not present

## 2014-03-23 DIAGNOSIS — Z7902 Long term (current) use of antithrombotics/antiplatelets: Secondary | ICD-10-CM | POA: Diagnosis not present

## 2014-03-23 DIAGNOSIS — Z888 Allergy status to other drugs, medicaments and biological substances status: Secondary | ICD-10-CM

## 2014-03-23 DIAGNOSIS — I25119 Atherosclerotic heart disease of native coronary artery with unspecified angina pectoris: Secondary | ICD-10-CM | POA: Diagnosis present

## 2014-03-23 DIAGNOSIS — I2510 Atherosclerotic heart disease of native coronary artery without angina pectoris: Secondary | ICD-10-CM | POA: Diagnosis not present

## 2014-03-23 DIAGNOSIS — I959 Hypotension, unspecified: Secondary | ICD-10-CM | POA: Diagnosis not present

## 2014-03-23 DIAGNOSIS — Z95810 Presence of automatic (implantable) cardiac defibrillator: Secondary | ICD-10-CM | POA: Diagnosis not present

## 2014-03-23 DIAGNOSIS — Z006 Encounter for examination for normal comparison and control in clinical research program: Secondary | ICD-10-CM | POA: Diagnosis not present

## 2014-03-23 DIAGNOSIS — I252 Old myocardial infarction: Secondary | ICD-10-CM | POA: Diagnosis not present

## 2014-03-23 DIAGNOSIS — Z7982 Long term (current) use of aspirin: Secondary | ICD-10-CM | POA: Diagnosis not present

## 2014-03-23 DIAGNOSIS — I255 Ischemic cardiomyopathy: Secondary | ICD-10-CM | POA: Diagnosis present

## 2014-03-23 DIAGNOSIS — I350 Nonrheumatic aortic (valve) stenosis: Secondary | ICD-10-CM | POA: Diagnosis not present

## 2014-03-23 DIAGNOSIS — J9811 Atelectasis: Secondary | ICD-10-CM | POA: Diagnosis not present

## 2014-03-23 HISTORY — PX: TEE WITHOUT CARDIOVERSION: SHX5443

## 2014-03-23 HISTORY — DX: Nonrheumatic aortic (valve) stenosis: I35.0

## 2014-03-23 HISTORY — PX: TRANSCATHETER AORTIC VALVE REPLACEMENT, TRANSFEMORAL: SHX6400

## 2014-03-23 LAB — POCT I-STAT, CHEM 8
BUN: 23 mg/dL (ref 6–23)
BUN: 24 mg/dL — AB (ref 6–23)
BUN: 21 mg/dL (ref 6–23)
CALCIUM ION: 1.39 mmol/L — AB (ref 1.13–1.30)
CALCIUM ION: 1.48 mmol/L — AB (ref 1.13–1.30)
CHLORIDE: 109 meq/L (ref 96–112)
CREATININE: 0.7 mg/dL (ref 0.50–1.35)
CREATININE: 0.9 mg/dL (ref 0.50–1.35)
Calcium, Ion: 1.35 mmol/L — ABNORMAL HIGH (ref 1.13–1.30)
Chloride: 105 mEq/L (ref 96–112)
Chloride: 108 mEq/L (ref 96–112)
Creatinine, Ser: 0.8 mg/dL (ref 0.50–1.35)
GLUCOSE: 103 mg/dL — AB (ref 70–99)
Glucose, Bld: 121 mg/dL — ABNORMAL HIGH (ref 70–99)
Glucose, Bld: 117 mg/dL — ABNORMAL HIGH (ref 70–99)
HCT: 33 % — ABNORMAL LOW (ref 39.0–52.0)
HCT: 33 % — ABNORMAL LOW (ref 39.0–52.0)
HEMATOCRIT: 34 % — AB (ref 39.0–52.0)
HEMOGLOBIN: 11.2 g/dL — AB (ref 13.0–17.0)
Hemoglobin: 11.2 g/dL — ABNORMAL LOW (ref 13.0–17.0)
Hemoglobin: 11.6 g/dL — ABNORMAL LOW (ref 13.0–17.0)
POTASSIUM: 4.1 meq/L (ref 3.7–5.3)
POTASSIUM: 3.9 meq/L (ref 3.7–5.3)
Potassium: 3.9 mEq/L (ref 3.7–5.3)
SODIUM: 142 meq/L (ref 137–147)
Sodium: 143 mEq/L (ref 137–147)
Sodium: 143 mEq/L (ref 137–147)
TCO2: 23 mmol/L (ref 0–100)
TCO2: 23 mmol/L (ref 0–100)
TCO2: 21 mmol/L (ref 0–100)

## 2014-03-23 LAB — POCT I-STAT 3, ART BLOOD GAS (G3+)
Acid-base deficit: 3 mmol/L — ABNORMAL HIGH (ref 0.0–2.0)
BICARBONATE: 22.4 meq/L (ref 20.0–24.0)
O2 Saturation: 96 %
PH ART: 7.346 — AB (ref 7.350–7.450)
PO2 ART: 85 mmHg (ref 80.0–100.0)
TCO2: 24 mmol/L (ref 0–100)
pCO2 arterial: 40.6 mmHg (ref 35.0–45.0)

## 2014-03-23 LAB — CBC
HCT: 32.8 % — ABNORMAL LOW (ref 39.0–52.0)
Hemoglobin: 11 g/dL — ABNORMAL LOW (ref 13.0–17.0)
MCH: 31.2 pg (ref 26.0–34.0)
MCHC: 33.5 g/dL (ref 30.0–36.0)
MCV: 92.9 fL (ref 78.0–100.0)
Platelets: 95 10*3/uL — ABNORMAL LOW (ref 150–400)
RBC: 3.53 MIL/uL — AB (ref 4.22–5.81)
RDW: 13.4 % (ref 11.5–15.5)
WBC: 5.6 10*3/uL (ref 4.0–10.5)

## 2014-03-23 LAB — GLUCOSE, CAPILLARY
GLUCOSE-CAPILLARY: 131 mg/dL — AB (ref 70–99)
Glucose-Capillary: 116 mg/dL — ABNORMAL HIGH (ref 70–99)

## 2014-03-23 LAB — PREPARE RBC (CROSSMATCH)

## 2014-03-23 LAB — PROTIME-INR
INR: 1.29 (ref 0.00–1.49)
PROTHROMBIN TIME: 16.3 s — AB (ref 11.6–15.2)

## 2014-03-23 LAB — POCT I-STAT 4, (NA,K, GLUC, HGB,HCT)
Glucose, Bld: 94 mg/dL (ref 70–99)
HCT: 34 % — ABNORMAL LOW (ref 39.0–52.0)
Hemoglobin: 11.6 g/dL — ABNORMAL LOW (ref 13.0–17.0)
Potassium: 3.9 mEq/L (ref 3.7–5.3)
Sodium: 143 mEq/L (ref 137–147)

## 2014-03-23 LAB — APTT: aPTT: 39 seconds — ABNORMAL HIGH (ref 24–37)

## 2014-03-23 SURGERY — IMPLANTATION, AORTIC VALVE, TRANSCATHETER, FEMORAL APPROACH
Anesthesia: General | Site: Chest

## 2014-03-23 MED ORDER — MORPHINE SULFATE 2 MG/ML IJ SOLN: 2.0000 mg | INTRAMUSCULAR | Status: DC | PRN

## 2014-03-23 MED ORDER — FENTANYL CITRATE 0.05 MG/ML IJ SOLN
INTRAMUSCULAR | Status: AC
  Administered 2014-03-23: 50 ug via INTRAVENOUS
  Filled 2014-03-23: qty 2

## 2014-03-23 MED ORDER — STERILE WATER FOR INJECTION IJ SOLN
INTRAMUSCULAR | Status: AC
  Filled 2014-03-23: qty 10

## 2014-03-23 MED ORDER — FENTANYL CITRATE 0.05 MG/ML IJ SOLN
INTRAMUSCULAR | Status: AC
  Filled 2014-03-23: qty 5

## 2014-03-23 MED ORDER — GLYCOPYRROLATE 0.2 MG/ML IJ SOLN
INTRAMUSCULAR | Status: AC
  Filled 2014-03-23: qty 3

## 2014-03-23 MED ORDER — ACETAMINOPHEN 650 MG RE SUPP: 650.0000 mg | Freq: Once | RECTAL | Status: DC

## 2014-03-23 MED ORDER — SUCCINYLCHOLINE CHLORIDE 20 MG/ML IJ SOLN
INTRAMUSCULAR | Status: AC
  Filled 2014-03-23: qty 1

## 2014-03-23 MED ORDER — CLOPIDOGREL BISULFATE 75 MG PO TABS
75.0000 mg | ORAL_TABLET | Freq: Every day | ORAL | Status: DC
  Administered 2014-03-24 – 2014-03-26 (×3): 75 mg via ORAL
  Filled 2014-03-23 (×3): qty 1

## 2014-03-23 MED ORDER — TRAMADOL HCL 50 MG PO TABS
50.0000 mg | ORAL_TABLET | ORAL | Status: DC | PRN
  Administered 2014-03-23: 100 mg via ORAL
  Filled 2014-03-23: qty 2

## 2014-03-23 MED ORDER — VECURONIUM BROMIDE 10 MG IV SOLR
INTRAVENOUS | Status: AC
  Filled 2014-03-23: qty 10

## 2014-03-23 MED ORDER — ACETAMINOPHEN 160 MG/5ML PO SOLN
650.0000 mg | Freq: Once | ORAL | Status: AC
  Administered 2014-03-23: 650 mg via ORAL
  Filled 2014-03-23: qty 20.3

## 2014-03-23 MED ORDER — METOPROLOL TARTRATE 25 MG/10 ML ORAL SUSPENSION
12.5000 mg | Freq: Two times a day (BID) | ORAL | Status: DC
  Filled 2014-03-23 (×3): qty 5

## 2014-03-23 MED ORDER — PHENYLEPHRINE 40 MCG/ML (10ML) SYRINGE FOR IV PUSH (FOR BLOOD PRESSURE SUPPORT)
PREFILLED_SYRINGE | INTRAVENOUS | Status: AC
  Filled 2014-03-23: qty 10

## 2014-03-23 MED ORDER — ALBUMIN HUMAN 5 % IV SOLN
250.0000 mL | INTRAVENOUS | Status: AC | PRN
  Filled 2014-03-23: qty 250

## 2014-03-23 MED ORDER — VANCOMYCIN HCL IN DEXTROSE 1-5 GM/200ML-% IV SOLN
1000.0000 mg | Freq: Once | INTRAVENOUS | Status: AC
  Administered 2014-03-23: 1000 mg via INTRAVENOUS
  Filled 2014-03-23: qty 200

## 2014-03-23 MED ORDER — LIDOCAINE HCL (CARDIAC) 20 MG/ML IV SOLN
INTRAVENOUS | Status: AC
  Filled 2014-03-23: qty 5

## 2014-03-23 MED ORDER — FAMOTIDINE IN NACL 20-0.9 MG/50ML-% IV SOLN
20.0000 mg | Freq: Two times a day (BID) | INTRAVENOUS | Status: AC
  Administered 2014-03-23: 20 mg via INTRAVENOUS

## 2014-03-23 MED ORDER — ASPIRIN EC 81 MG PO TBEC
81.0000 mg | DELAYED_RELEASE_TABLET | Freq: Every day | ORAL | Status: DC
  Administered 2014-03-24 – 2014-03-26 (×2): 81 mg via ORAL
  Filled 2014-03-23 (×3): qty 1

## 2014-03-23 MED ORDER — NOREPINEPHRINE BITARTRATE 1 MG/ML IV SOLN
0.0000 ug/min | INTRAVENOUS | Status: DC
  Filled 2014-03-23: qty 4

## 2014-03-23 MED ORDER — FENTANYL CITRATE 0.05 MG/ML IJ SOLN
INTRAMUSCULAR | Status: DC | PRN
  Administered 2014-03-23: 100 ug via INTRAVENOUS

## 2014-03-23 MED ORDER — LACTATED RINGERS IV SOLN: 500.0000 mL | Freq: Once | INTRAVENOUS | Status: AC | PRN

## 2014-03-23 MED ORDER — DEXMEDETOMIDINE HCL IN NACL 200 MCG/50ML IV SOLN: 0.1000 ug/kg/h | INTRAVENOUS | Status: DC

## 2014-03-23 MED ORDER — OXYCODONE HCL 5 MG PO TABS: 5.0000 mg | ORAL_TABLET | ORAL | Status: DC | PRN

## 2014-03-23 MED ORDER — SODIUM CHLORIDE 0.9 % IV SOLN
INTRAVENOUS | Status: AC
  Administered 2014-03-23: 16:00:00 via INTRAVENOUS

## 2014-03-23 MED ORDER — GLYCOPYRROLATE 0.2 MG/ML IJ SOLN
INTRAMUSCULAR | Status: AC
  Filled 2014-03-23: qty 4

## 2014-03-23 MED ORDER — IODIXANOL 320 MG/ML IV SOLN
INTRAVENOUS | Status: DC | PRN
  Administered 2014-03-23: 150 mL via INTRAVENOUS
  Administered 2014-03-23: 60.4 mL via INTRAVENOUS

## 2014-03-23 MED ORDER — ROCURONIUM BROMIDE 100 MG/10ML IV SOLN
INTRAVENOUS | Status: DC | PRN
  Administered 2014-03-23: 10 mg via INTRAVENOUS
  Administered 2014-03-23: 40 mg via INTRAVENOUS

## 2014-03-23 MED ORDER — INSULIN REGULAR BOLUS VIA INFUSION
0.0000 [IU] | Freq: Three times a day (TID) | INTRAVENOUS | Status: DC
  Filled 2014-03-23: qty 10

## 2014-03-23 MED ORDER — HEPARIN SODIUM (PORCINE) 1000 UNIT/ML IJ SOLN
INTRAMUSCULAR | Status: DC | PRN
  Administered 2014-03-23: 15000 [IU] via INTRAVENOUS

## 2014-03-23 MED ORDER — FUROSEMIDE 40 MG PO TABS
40.0000 mg | ORAL_TABLET | Freq: Every day | ORAL | Status: DC
  Administered 2014-03-23 – 2014-03-26 (×4): 40 mg via ORAL
  Filled 2014-03-23 (×4): qty 1

## 2014-03-23 MED ORDER — PROTAMINE SULFATE 10 MG/ML IV SOLN
INTRAVENOUS | Status: DC | PRN
  Administered 2014-03-23 (×5): 20 mg via INTRAVENOUS

## 2014-03-23 MED ORDER — DEXTROSE 5 % IV SOLN
1.5000 g | Freq: Two times a day (BID) | INTRAVENOUS | Status: AC
  Administered 2014-03-23 – 2014-03-25 (×4): 1.5 g via INTRAVENOUS
  Filled 2014-03-23 (×6): qty 1.5

## 2014-03-23 MED ORDER — SODIUM CHLORIDE 0.9 % IJ SOLN
INTRAMUSCULAR | Status: AC
  Filled 2014-03-23: qty 10

## 2014-03-23 MED ORDER — METOPROLOL TARTRATE 12.5 MG HALF TABLET
12.5000 mg | ORAL_TABLET | Freq: Two times a day (BID) | ORAL | Status: DC
  Filled 2014-03-23 (×3): qty 1

## 2014-03-23 MED ORDER — MORPHINE SULFATE 2 MG/ML IJ SOLN: 1.0000 mg | INTRAMUSCULAR | Status: AC | PRN

## 2014-03-23 MED ORDER — ACETAMINOPHEN 650 MG RE SUPP: 650.0000 mg | Freq: Once | RECTAL | Status: AC

## 2014-03-23 MED ORDER — NITROGLYCERIN IN D5W 200-5 MCG/ML-% IV SOLN: 0.0000 ug/min | INTRAVENOUS | Status: DC

## 2014-03-23 MED ORDER — ROCURONIUM BROMIDE 50 MG/5ML IV SOLN
INTRAVENOUS | Status: AC
  Filled 2014-03-23: qty 1

## 2014-03-23 MED ORDER — HEPARIN SODIUM (PORCINE) 5000 UNIT/ML IJ SOLN
INTRAMUSCULAR | Status: DC | PRN
  Administered 2014-03-23: 1500 mL

## 2014-03-23 MED ORDER — SODIUM CHLORIDE 0.9 % IV SOLN
INTRAVENOUS | Status: DC
  Filled 2014-03-23: qty 2.5

## 2014-03-23 MED ORDER — EPHEDRINE SULFATE 50 MG/ML IJ SOLN
INTRAMUSCULAR | Status: AC
  Filled 2014-03-23: qty 1

## 2014-03-23 MED ORDER — ONDANSETRON HCL 4 MG/2ML IJ SOLN
INTRAMUSCULAR | Status: DC | PRN
Start: 2014-03-23 — End: 2014-03-23
  Administered 2014-03-23: 4 mg via INTRAVENOUS

## 2014-03-23 MED ORDER — MIDAZOLAM HCL 2 MG/2ML IJ SOLN
INTRAMUSCULAR | Status: AC
  Administered 2014-03-23: 1 mg via INTRAVENOUS
  Filled 2014-03-23: qty 2

## 2014-03-23 MED ORDER — MIDAZOLAM HCL 2 MG/2ML IJ SOLN
INTRAMUSCULAR | Status: AC
  Filled 2014-03-23: qty 2

## 2014-03-23 MED ORDER — LATANOPROST 0.005 % OP SOLN
1.0000 [drp] | Freq: Every day | OPHTHALMIC | Status: DC
  Administered 2014-03-23 – 2014-03-25 (×3): 1 [drp] via OPHTHALMIC
  Filled 2014-03-23: qty 2.5

## 2014-03-23 MED ORDER — CHLORHEXIDINE GLUCONATE CLOTH 2 % EX PADS
6.0000 | MEDICATED_PAD | Freq: Every day | CUTANEOUS | Status: DC
  Administered 2014-03-24: 6 via TOPICAL

## 2014-03-23 MED ORDER — ASPIRIN 81 MG PO CHEW
324.0000 mg | CHEWABLE_TABLET | Freq: Every day | ORAL | Status: DC
  Administered 2014-03-25: 81 mg
  Filled 2014-03-23 (×2): qty 4

## 2014-03-23 MED ORDER — ARTIFICIAL TEARS OP OINT
TOPICAL_OINTMENT | OPHTHALMIC | Status: DC | PRN
  Administered 2014-03-23: 1 via OPHTHALMIC

## 2014-03-23 MED ORDER — ARTIFICIAL TEARS OP OINT
TOPICAL_OINTMENT | OPHTHALMIC | Status: AC
  Filled 2014-03-23: qty 3.5

## 2014-03-23 MED ORDER — NEOSTIGMINE METHYLSULFATE 10 MG/10ML IV SOLN
INTRAVENOUS | Status: AC
  Filled 2014-03-23: qty 1

## 2014-03-23 MED ORDER — ACETAMINOPHEN 500 MG PO TABS
1000.0000 mg | ORAL_TABLET | Freq: Four times a day (QID) | ORAL | Status: DC
  Administered 2014-03-23 – 2014-03-26 (×6): 1000 mg via ORAL
  Filled 2014-03-23 (×16): qty 2

## 2014-03-23 MED ORDER — MIDAZOLAM HCL 2 MG/2ML IJ SOLN
1.0000 mg | INTRAMUSCULAR | Status: DC | PRN
  Administered 2014-03-23: 1 mg via INTRAVENOUS

## 2014-03-23 MED ORDER — INSULIN ASPART 100 UNIT/ML ~~LOC~~ SOLN
0.0000 [IU] | SUBCUTANEOUS | Status: DC
Start: 2014-03-23 — End: 2014-03-26
  Administered 2014-03-23 – 2014-03-24 (×3): 2 [IU] via SUBCUTANEOUS
  Administered 2014-03-25: 4 [IU] via SUBCUTANEOUS
  Administered 2014-03-25: 2 [IU] via SUBCUTANEOUS

## 2014-03-23 MED ORDER — PHENYLEPHRINE HCL 10 MG/ML IJ SOLN
0.0000 ug/min | INTRAVENOUS | Status: DC
  Filled 2014-03-23: qty 2

## 2014-03-23 MED ORDER — PROPOFOL 10 MG/ML IV BOLUS
INTRAVENOUS | Status: DC | PRN
  Administered 2014-03-23: 40 mg via INTRAVENOUS
  Administered 2014-03-23: 90 mg via INTRAVENOUS

## 2014-03-23 MED ORDER — MIDAZOLAM HCL 2 MG/2ML IJ SOLN: 2.0000 mg | INTRAMUSCULAR | Status: DC | PRN

## 2014-03-23 MED ORDER — FENTANYL CITRATE 0.05 MG/ML IJ SOLN
50.0000 ug | INTRAMUSCULAR | Status: DC | PRN
  Administered 2014-03-23: 50 ug via INTRAVENOUS

## 2014-03-23 MED ORDER — PANTOPRAZOLE SODIUM 40 MG PO TBEC
40.0000 mg | DELAYED_RELEASE_TABLET | Freq: Every day | ORAL | Status: DC
  Administered 2014-03-24 – 2014-03-26 (×3): 40 mg via ORAL
  Filled 2014-03-23 (×3): qty 1

## 2014-03-23 MED ORDER — NEOSTIGMINE METHYLSULFATE 10 MG/10ML IV SOLN
INTRAVENOUS | Status: DC | PRN
  Administered 2014-03-23: 4 mg via INTRAVENOUS

## 2014-03-23 MED ORDER — METOPROLOL TARTRATE 1 MG/ML IV SOLN: 2.5000 mg | INTRAVENOUS | Status: DC | PRN

## 2014-03-23 MED ORDER — ROSUVASTATIN CALCIUM 20 MG PO TABS
20.0000 mg | ORAL_TABLET | Freq: Every day | ORAL | Status: DC
  Administered 2014-03-23 – 2014-03-24 (×2): 20 mg via ORAL
  Filled 2014-03-23 (×5): qty 1

## 2014-03-23 MED ORDER — GLYCOPYRROLATE 0.2 MG/ML IJ SOLN
INTRAMUSCULAR | Status: DC | PRN
  Administered 2014-03-23: 0.6 mg via INTRAVENOUS

## 2014-03-23 MED ORDER — PROPOFOL 10 MG/ML IV BOLUS
INTRAVENOUS | Status: AC
  Filled 2014-03-23: qty 20

## 2014-03-23 MED ORDER — 0.9 % SODIUM CHLORIDE (POUR BTL) OPTIME
TOPICAL | Status: DC | PRN
  Administered 2014-03-23: 5000 mL

## 2014-03-23 MED ORDER — ACETAMINOPHEN 160 MG/5ML PO SOLN: 650.0000 mg | Freq: Once | ORAL | Status: DC

## 2014-03-23 MED ORDER — ONDANSETRON HCL 4 MG/2ML IJ SOLN
INTRAMUSCULAR | Status: AC
  Filled 2014-03-23: qty 2

## 2014-03-23 MED ORDER — MUPIROCIN 2 % EX OINT
1.0000 "application " | TOPICAL_OINTMENT | Freq: Two times a day (BID) | CUTANEOUS | Status: DC
  Administered 2014-03-24 – 2014-03-26 (×4): 1 via NASAL
  Filled 2014-03-23: qty 22

## 2014-03-23 MED ORDER — LACTATED RINGERS IV SOLN
INTRAVENOUS | Status: DC
  Administered 2014-03-23 (×3): via INTRAVENOUS

## 2014-03-23 MED ORDER — ONDANSETRON HCL 4 MG/2ML IJ SOLN: 4.0000 mg | Freq: Four times a day (QID) | INTRAMUSCULAR | Status: DC | PRN

## 2014-03-23 MED ORDER — PHENYLEPHRINE HCL 10 MG/ML IJ SOLN
INTRAMUSCULAR | Status: DC | PRN
Start: 2014-03-23 — End: 2014-03-23
  Administered 2014-03-23 (×2): 120 ug via INTRAVENOUS

## 2014-03-23 MED ORDER — ACETAMINOPHEN 160 MG/5ML PO SOLN
1000.0000 mg | Freq: Four times a day (QID) | ORAL | Status: DC
  Filled 2014-03-23: qty 40

## 2014-03-23 SURGICAL SUPPLY — 146 items
ADAPTER CARDIOPLEGIA (MISCELLANEOUS) IMPLANT
ADH SKN CLS APL DERMABOND .7 (GAUZE/BANDAGES/DRESSINGS) ×2
ANTEGRADE CPLG (MISCELLANEOUS) IMPLANT
APL SKNCLS STERI-STRIP NONHPOA (GAUZE/BANDAGES/DRESSINGS) ×2
ATTRACTOMAT 16X20 MAGNETIC DRP (DRAPES) IMPLANT
BAG BANDED W/RUBBER/TAPE 36X54 (MISCELLANEOUS) ×3 IMPLANT
BAG DECANTER FOR FLEXI CONT (MISCELLANEOUS) ×1 IMPLANT
BAG EQP BAND 135X91 W/RBR TAPE (MISCELLANEOUS) ×2
BAG SNAP BAND KOVER 36X36 (MISCELLANEOUS) ×6 IMPLANT
BENZOIN TINCTURE PRP APPL 2/3 (GAUZE/BANDAGES/DRESSINGS) ×3 IMPLANT
BLADE 10 SAFETY STRL DISP (BLADE) ×3 IMPLANT
BLADE OSCILLATING /SAGITTAL (BLADE) ×3 IMPLANT
BLADE STERNUM SYSTEM 6 (BLADE) ×2 IMPLANT
BLADE SURG ROTATE 9660 (MISCELLANEOUS) ×3 IMPLANT
CABLE PACING FASLOC BIEGE (MISCELLANEOUS) ×3 IMPLANT
CABLE PACING FASLOC BLUE (MISCELLANEOUS) ×2 IMPLANT
CANISTER SUCTION 2500CC (MISCELLANEOUS) ×6 IMPLANT
CANNULA FEM VENOUS REMOTE 22FR (CANNULA) IMPLANT
CANNULA FEMORAL ART 14 SM (MISCELLANEOUS) IMPLANT
CANNULA GUNDRY RCSP 15FR (MISCELLANEOUS) IMPLANT
CANNULA OPTISITE PERFUSION 16F (CANNULA) IMPLANT
CANNULA OPTISITE PERFUSION 18F (CANNULA) IMPLANT
CANNULA SOFTFLOW AORTIC 7M21FR (CANNULA) IMPLANT
CANNULA VENOUS LOW PROF 34X46 (CANNULA) IMPLANT
CATH DIAG EXPO 6F AL2 (CATHETERS) ×3 IMPLANT
CATH DIAG EXPO 6F VENT PIG 145 (CATHETERS) ×2 IMPLANT
CATH HEART VENT LEFT (CATHETERS) IMPLANT
CATH S G BIP PACING (SET/KITS/TRAYS/PACK) ×4 IMPLANT
CATH SOFT-VU 4F 65 STRAIGHT (CATHETERS) IMPLANT
CATH SOFT-VU STRAIGHT 4F 65CM (CATHETERS) ×3
CLIP TI MEDIUM 24 (CLIP) ×3 IMPLANT
CLIP TI MEDIUM 6 (CLIP) IMPLANT
CLIP TI WIDE RED SMALL 24 (CLIP) ×3 IMPLANT
CLIP TI WIDE RED SMALL 6 (CLIP) ×3 IMPLANT
CONN ST 1/4X3/8  BEN (MISCELLANEOUS)
CONN ST 1/4X3/8 BEN (MISCELLANEOUS) IMPLANT
CONNECTOR 1/2X3/8X1/2 3 WAY (MISCELLANEOUS)
CONNECTOR 1/2X3/8X1/2 3WAY (MISCELLANEOUS) IMPLANT
CONT SPECI 4OZ STER CLIK (MISCELLANEOUS) ×4 IMPLANT
COVER BACK TABLE 24X17X13 BIG (DRAPES) ×3 IMPLANT
COVER DOME SNAP 22 D (MISCELLANEOUS) ×3 IMPLANT
COVER MAYO STAND STRL (DRAPES) ×4 IMPLANT
COVER PROBE W GEL 5X96 (DRAPES) IMPLANT
COVER SURGICAL LIGHT HANDLE (MISCELLANEOUS) ×3 IMPLANT
COVER TABLE BACK 60X90 (DRAPES) ×9 IMPLANT
CRADLE DONUT ADULT HEAD (MISCELLANEOUS) ×3 IMPLANT
DERMABOND ADVANCED (GAUZE/BANDAGES/DRESSINGS) ×1
DERMABOND ADVANCED .7 DNX12 (GAUZE/BANDAGES/DRESSINGS) ×2 IMPLANT
DRAIN CHANNEL 28F RND 3/8 FF (WOUND CARE) IMPLANT
DRAIN CHANNEL 32F RND 10.7 FF (WOUND CARE) IMPLANT
DRAPE INCISE IOBAN 66X45 STRL (DRAPES) IMPLANT
DRAPE SLUSH/WARMER DISC (DRAPES) ×3 IMPLANT
DRAPE SURG IRRIG POUCH 19X23 (DRAPES) ×3 IMPLANT
DRAPE TABLE COVER HEAVY DUTY (DRAPES) ×3 IMPLANT
DRSG TEGADERM 4X4.75 (GAUZE/BANDAGES/DRESSINGS) ×3 IMPLANT
ELECT BLADE 6.5 EXT (BLADE) IMPLANT
ELECT REM PT RETURN 9FT ADLT (ELECTROSURGICAL) ×6
ELECTRODE REM PT RTRN 9FT ADLT (ELECTROSURGICAL) ×4 IMPLANT
FELT TEFLON 6X6 (MISCELLANEOUS) ×2 IMPLANT
FEMORAL VENOUS CANN RAP (CANNULA) IMPLANT
GAUZE SPONGE 4X4 12PLY STRL (GAUZE/BANDAGES/DRESSINGS) ×3 IMPLANT
GLOVE BIOGEL M 6.5 STRL (GLOVE) IMPLANT
GLOVE BIOGEL M STER SZ 6 (GLOVE) IMPLANT
GLOVE ECLIPSE 7.5 STRL STRAW (GLOVE) ×4 IMPLANT
GLOVE ECLIPSE 8.0 STRL XLNG CF (GLOVE) ×5 IMPLANT
GLOVE EUDERMIC 7 POWDERFREE (GLOVE) ×3 IMPLANT
GLOVE ORTHO TXT STRL SZ7.5 (GLOVE) ×3 IMPLANT
GOWN STRL REUS W/ TWL LRG LVL3 (GOWN DISPOSABLE) ×6 IMPLANT
GOWN STRL REUS W/ TWL XL LVL3 (GOWN DISPOSABLE) ×14 IMPLANT
GOWN STRL REUS W/TWL LRG LVL3 (GOWN DISPOSABLE) ×15
GOWN STRL REUS W/TWL XL LVL3 (GOWN DISPOSABLE) ×9
GUIDEWIRE SAF TJ AMPL .035X180 (WIRE) ×3 IMPLANT
GUIDEWIRE SAFE TJ AMPLATZ EXST (WIRE) ×3 IMPLANT
GUIDEWIRE STRAIGHT .035 260CM (WIRE) ×1 IMPLANT
GUIDEWIRE WHOLEY .035 145 JTIP (WIRE) ×1 IMPLANT
HEMOSTAT POWDER SURGIFOAM 1G (HEMOSTASIS) IMPLANT
INSERT FOGARTY 61MM (MISCELLANEOUS) IMPLANT
INSERT FOGARTY SM (MISCELLANEOUS) ×2 IMPLANT
INSERT FOGARTY XLG (MISCELLANEOUS) IMPLANT
KIT BASIN OR (CUSTOM PROCEDURE TRAY) ×3 IMPLANT
KIT DILATOR VASC 18G NDL (KITS) IMPLANT
KIT HEART LEFT (KITS) ×1 IMPLANT
KIT ROOM TURNOVER OR (KITS) ×3 IMPLANT
KIT SUCTION CATH 14FR (SUCTIONS) ×8 IMPLANT
LEAD PACING MYOCARDI (MISCELLANEOUS) IMPLANT
LIQUID BAND (GAUZE/BANDAGES/DRESSINGS) ×3 IMPLANT
NDL PERC 18GX7CM (NEEDLE) ×2 IMPLANT
NEEDLE PERC 18GX7CM (NEEDLE) ×3 IMPLANT
NS IRRIG 1000ML POUR BTL (IV SOLUTION) ×20 IMPLANT
PACK AORTA (CUSTOM PROCEDURE TRAY) ×3 IMPLANT
PAD ARMBOARD 7.5X6 YLW CONV (MISCELLANEOUS) ×6 IMPLANT
PAD ELECT DEFIB RADIOL ZOLL (MISCELLANEOUS) ×3 IMPLANT
PATCH TACHOSII LRG 9.5X4.8 (VASCULAR PRODUCTS) IMPLANT
RETRACTOR TRL SOFT TISSUE LG (INSTRUMENTS) IMPLANT
RETRACTOR TRM SOFT TISSUE 7.5 (INSTRUMENTS) IMPLANT
SET CANNULATION TOURNIQUET (MISCELLANEOUS) IMPLANT
SHEATH PINNACLE 6F 10CM (SHEATH) ×2 IMPLANT
SPONGE LAP 4X18 X RAY DECT (DISPOSABLE) ×3 IMPLANT
STOPCOCK MORSE 400PSI 3WAY (MISCELLANEOUS) ×3 IMPLANT
SUT BONE WAX W31G (SUTURE) IMPLANT
SUT ETHIBOND 2 0 SH (SUTURE) ×6
SUT ETHIBOND 2 0 SH 36X2 (SUTURE) ×2 IMPLANT
SUT ETHIBOND X763 2 0 SH 1 (SUTURE) ×5 IMPLANT
SUT MNCRL AB 3-0 PS2 18 (SUTURE) ×4 IMPLANT
SUT PDS AB 1 CTX 36 (SUTURE) IMPLANT
SUT PROLENE 2 0 MH 48 (SUTURE) ×9 IMPLANT
SUT PROLENE 3 0 SH1 36 (SUTURE) IMPLANT
SUT PROLENE 4 0 RB 1 (SUTURE) ×3
SUT PROLENE 4-0 RB1 .5 CRCL 36 (SUTURE) ×2 IMPLANT
SUT PROLENE 5 0 C 1 36 (SUTURE) ×6 IMPLANT
SUT PROLENE 6 0 C 1 30 (SUTURE) ×8 IMPLANT
SUT SILK  1 MH (SUTURE) ×1
SUT SILK 1 MH (SUTURE) ×2 IMPLANT
SUT SILK 1 TIES 10X30 (SUTURE) ×3 IMPLANT
SUT SILK 2 0 SH CR/8 (SUTURE) ×3 IMPLANT
SUT SILK 2 0SH CR/8 30 (SUTURE) IMPLANT
SUT TEM PAC WIRE 2 0 SH (SUTURE) IMPLANT
SUT VIC AB 1 CTX 36 (SUTURE)
SUT VIC AB 1 CTX36XBRD ANBCTR (SUTURE) ×2 IMPLANT
SUT VIC AB 2-0 CT1 27 (SUTURE) ×3
SUT VIC AB 2-0 CT1 TAPERPNT 27 (SUTURE) IMPLANT
SUT VIC AB 2-0 CTX 36 (SUTURE) ×7 IMPLANT
SUT VIC AB 3-0 SH 8-18 (SUTURE) ×4 IMPLANT
SUT VIC AB 3-0 X1 27 (SUTURE) ×1 IMPLANT
SUT VICRYL 2 TP 1 (SUTURE) IMPLANT
SYR 30ML LL (SYRINGE) ×6 IMPLANT
SYR 3ML LL SCALE MARK (SYRINGE) ×3 IMPLANT
SYR 50ML LL SCALE MARK (SYRINGE) ×4 IMPLANT
SYR CONTROL 10ML LL (SYRINGE) ×1 IMPLANT
SYR MEDRAD MARK V 150ML (SYRINGE) ×3 IMPLANT
SYRINGE 3CC LL L/F (MISCELLANEOUS) ×3 IMPLANT
SYSTEM SAHARA CHEST DRAIN ATS (WOUND CARE) ×3 IMPLANT
TOWEL OR 17X24 6PK STRL BLUE (TOWEL DISPOSABLE) ×9 IMPLANT
TOWEL OR 17X26 10 PK STRL BLUE (TOWEL DISPOSABLE) ×6 IMPLANT
TRANSDUCER W/STOPCOCK (MISCELLANEOUS) ×2 IMPLANT
TRAY FOLEY IC TEMP SENS 14FR (CATHETERS) ×4 IMPLANT
TUBE SUCT INTRACARD DLP 20F (MISCELLANEOUS) ×1 IMPLANT
TUBING ART PRESS 72  MALE/FEM (TUBING) ×1
TUBING ART PRESS 72 MALE/FEM (TUBING) ×2 IMPLANT
TUBING HIGH PRESSURE 120CM (CONNECTOR) ×3 IMPLANT
UNDERPAD 30X30 INCONTINENT (UNDERPADS AND DIAPERS) ×3 IMPLANT
VALVE HEART TRANSCATH SZ3 26MM (Prosthesis & Implant Heart) ×1 IMPLANT
VENT LEFT HEART 12002 (CATHETERS)
WATER STERILE IRR 1000ML POUR (IV SOLUTION) ×6 IMPLANT
WIRE .035 3MM-J 145CM (WIRE) ×1 IMPLANT
WIRE AMPLATZ SS-J .035X180CM (WIRE) ×2 IMPLANT

## 2014-03-23 NOTE — Transfer of Care (Signed)
Immediate Anesthesia Transfer of Care Note  Patient: [name]  Procedure(s) Performed: Procedure(s): TRANSCATHETER AORTIC VALVE REPLACEMENT, TRANSFEMORAL (N/A) TRANSESOPHAGEAL ECHOCARDIOGRAM (TEE) (N/A)  Patient Location: SICU  Anesthesia Type:General  Level of Consciousness: awake, alert  and oriented  Airway & Oxygen Therapy: Patient Spontanous Breathing and Patient connected to nasal cannula oxygen  Post-op Assessment: Report given to PACU RN  Post vital signs: Reviewed and stable  Complications: No apparent anesthesia complications

## 2014-03-23 NOTE — Anesthesia Postprocedure Evaluation (Signed)
  Anesthesia Post-op Note  Patient: [name]  Procedure(s) Performed: Procedure(s): TRANSCATHETER AORTIC VALVE REPLACEMENT, TRANSFEMORAL (N/A) TRANSESOPHAGEAL ECHOCARDIOGRAM (TEE) (N/A)  Patient Location: ICU  Anesthesia Type:General  Level of Consciousness: awake and alert   Airway and Oxygen Therapy: Patient Spontanous Breathing and Patient connected to nasal cannula oxygen  Post-op Pain: none  Post-op Assessment: Post-op Vital signs reviewed, Patient's Cardiovascular Status Stable, Respiratory Function Stable, Patent Airway, No signs of Nausea or vomiting and Pain level controlled  Post-op Vital Signs: Reviewed and stable  Last Vitals:  Filed Vitals:   03/23/14 1342  BP:   Pulse: 57  Temp:   Resp:     Complications: No apparent anesthesia complications

## 2014-03-23 NOTE — CV Procedure (Signed)
[facility]  TAVR OPERATIVE NOTE   Date of Procedure:  03/23/2014  Preoperative Diagnosis: Severe Aortic Stenosis   Postoperative Diagnosis: Same   Procedure:    Transcatheter Aortic Valve Replacement - Transfemoral Approach  Edwards Sapien 3 THV (size 26 mm, model # 9600TFX, serial # [id number])   Co-Surgeons: Gaye Pollack, MD and Lauree Chandler, MD   Assistants:  Valentina Gu. Roxy Manns, MD and Sherren Mocha, MD  Anesthesiologist:  Dr. Ermalene Postin  Echocardiographer:              Loralie Champagne, MD  Pre-operative Echo Findings:  Severe aortic stenosis  Normal left ventricular systolic function  Post-operative Echo Findings:  Mild paravalvular leak  Normal left ventricular systolic function  BRIEF CLINICAL NOTE AND INDICATIONS FOR SURGERY  80 yo male with history of CAD s/p CABG (1982 with redo 1994), ischemic cardiomyopathy, out-of-hospital cardiac arrest in 2011 with successful resuscitation and subsequent placement of [employer] ICD, and progressive aortic stenosis who is here today for assessment of his aortic valve stenosis. His aortic stenosis is now severe. He has been evaluated by Dr. Roxy Manns and Dr. Cyndia Bent in the valve clinic. He is felt to be a good candidate for transcatheter aortic valve replacement. He describes progressive decline in his exercise tolerance with increasing symptoms of exertional tightness in his upper chest and throat consistent with typical angina pectoris. The patient also has experienced progressive fatigue, decreased exercise tolerance, and mild exertional shortness of breath. He has been followed for many years by Dr. Mare Ferrari. Serial transthoracic echocardiograms have demonstrated slow progression of severity of aortic stenosis with mean transvalvular gradient estimated 35 mmHg and valve area calculated < 1 cm. Left ventricular ejection fraction is moderately reduced (LVEF=32%). The patient underwent exercise treadmill myoview stress  test on 01/28/2014 that was highly abnormal with large reversible anterior wall defect consistent with reversible ischemia. Resting ejection fraction was 32% on that exam. The patient subsequently underwent left and right heart catheterization 02/01/2014 by Dr. Tamala Julian which revealed that the patient had patent bypass graft from previous coronary artery bypass surgery including patent LIMA to LAD, SVG to RCA and SVG to OM. There was severe diffuse disease throughout the LAD both proximal to and distal to the graft insertion site, but no focal sites for PCI. Peak to peak and mean transvalvular gradient across the aortic valve measured 31 and 26 mmHg respectively by catheterization, with calculated aortic valve area 0.77 cm. His symptoms are felt to be worsened by his aortic valve stenosis.   During the course of the patient's preoperative work up they have been evaluated comprehensively by a multidisciplinary team of specialists coordinated through the [facility] in the [facility].  They have been demonstrated to suffer from symptomatic severe aortic stenosis as noted above. The patient has been counseled extensively as to the relative risks and benefits of all options for the treatment of severe aortic stenosis including long term medical therapy, conventional surgery for aortic valve replacement, and transcatheter aortic valve replacement.  The patient has been independently evaluated by two cardiac surgeons including Dr Roxy Manns and Dr. Cyndia Bent, and they are felt to be at high risk for conventional surgical aortic valve replacement based upon a predicted risk of mortality using the Society of Thoracic Surgeons risk calculator of 7.8%. Both surgeons indicated the patient would be a poor candidate for conventional surgery (predicted risk of mortality >15% and/or predicted risk of permanent morbidity >50%) because of  comorbidities including prior bypass surgery with  redo in past, advanced age.   Based upon review of all of the patient's preoperative diagnostic tests they are felt to be candidate for transcatheter aortic valve replacement using the transfemoral approach as an alternative to high risk conventional surgery.    Following the decision to proceed with transcatheter aortic valve replacement, a discussion has been held regarding what types of management strategies would be attempted intraoperatively in the event of life-threatening complications, including whether or not the patient would be considered a candidate for the use of cardiopulmonary bypass and/or conversion to open sternotomy for attempted surgical intervention.  The patient has been advised of a variety of complications that might develop peculiar to this approach including but not limited to risks of death, stroke, paravalvular leak, aortic dissection or other major vascular complications, aortic annulus rupture, device embolization, cardiac rupture or perforation, acute myocardial infarction, arrhythmia, heart block or bradycardia requiring permanent pacemaker placement, congestive heart failure, respiratory failure, renal failure, pneumonia, infection, other late complications related to structural valve deterioration or migration, or other complications that might ultimately cause a temporary or permanent loss of functional independence or other long term morbidity.  The patient provides full informed consent for the procedure as described and all questions were answered preoperatively.    DETAILS OF THE OPERATIVE PROCEDURE  PREPARATION:    The patient is brought to the operating room on the above mentioned date and central monitoring was established by the anesthesia team including placement of Swan-Ganz catheter and radial arterial line. The patient is placed in the supine position on the operating table.  Intravenous antibiotics are administered. General endotracheal anesthesia is induced  uneventfully. A Foley catheter is placed.  Baseline transesophageal echocardiogram was performed. The patient's chest, abdomen, both groins, and both lower extremities are prepared and draped in a sterile manner. A time out procedure is performed.   PERIPHERAL ACCESS:    Using the modified Seldinger technique, femoral arterial and venous access was obtained with placement of 6 Fr sheaths on the right side.  A pigtail diagnostic catheter was passed through the right arterial sheath under fluoroscopic guidance into the aortic root.  A temporary transvenous pacemaker catheter was passed through the right femoral venous sheath under fluoroscopic guidance into the right ventricle.  The pacemaker was tested to ensure stable lead placement and pacemaker capture. Aortic root angiography was performed in order to determine the optimal angiographic angle for valve deployment.   TRANSFEMORAL ACCESS:   A left femoral arterial cutdown was performed by Dr Cyndia Bent. Please see his separate operative note for details. The patient was heparinized systemically and ACT verified > 250 seconds.    A 14 Fr transfemoral E-sheath was introduced into the left femoral artery after progressively dilating over an Amplatz superstiff wire. An AL2 catheter was used to direct a straight-tip exchange length wire across the native aortic valve into the left ventricle. This was exchanged out for a pigtail catheter and position was confirmed in the LV apex. Simultaneous LV and Ao pressures were recorded.  The pigtail catheter was then exchanged for an Amplatz Extra-stiff wire in the LV apex. At that point, BAV was performed using a 23 mm valvuloplasty balloon.  Once optimal position was achieved, BAV was done under rapid ventricular pacing at 180 bpm x 2. The patient recovered well hemodynamically.   TRANSCATHETER HEART VALVE DEPLOYMENT:  An Edwards Sapien 3 THV (size 26 mm) was prepared and crimped per manufacturer's guidelines, and  the proper orientation of the valve is confirmed on the [employer] delivery system. The valve was advanced through the introducer sheath using normal technique until in an appropriate position in the abdominal aorta beyond the sheath tip. The balloon was then retracted and using the fine-tuning wheel was centered on the valve. The valve was then advanced across the aortic arch using appropriate flexion of the catheter. The valve was carefully positioned across the aortic valve annulus. The Commander catheter was retracted using normal technique. Once final position of the valve has been confirmed by angiographic assessment, the valve is deployed while temporarily holding ventilation and during rapid ventricular pacing to maintain systolic blood pressure < 50 mmHg and pulse pressure < 10 mmHg. The balloon inflation is held for >3 seconds after reaching full deployment volume. Once the balloon has fully deflated the balloon is retracted into the ascending aorta and valve function is assessed using TEE. There is felt to be mild paravalvular leak and no central aortic insufficiency.  The patient's hemodynamic recovery following valve deployment is good.  The deployment balloon and guidewire are both removed. Echo demostrated acceptable post-procedural gradients, stable mitral valve function, and mild AI.   PROCEDURE COMPLETION:  The sheath was then removed and arteriotomy repaired by Dr Cyndia Bent. Please see his separate report for details. Distal abdominal aortography was performed to evaluate for any arterial injury related to the procedure. There was no evidence dissection, perforation, or other vascular injury in the abdominal aorta, iliac artery, or femoral artery.  Protamine was administered once femoral arterial repair was complete. The temporary pacemaker, pigtail catheters and femoral sheaths were removed with manual pressure used for hemostasis.   The patient tolerated the procedure well and is  transported to the surgical intensive care in stable condition. There were no immediate intraoperative complications. All sponge instrument and needle counts are verified correct at completion of the operation.   No blood products were administered during the operation.  The patient received a total of 60.4 mL of intravenous contrast during the procedure.  Saryiah Bencosme MD 03/23/2014 12:39 PM

## 2014-03-23 NOTE — Anesthesia Procedure Notes (Addendum)
Procedure Name: Intubation Date/Time: 03/23/2014 12:29 PM Performed by: Sampson Si E Pre-anesthesia Checklist: Patient identified, Suction available, Emergency Drugs available, Patient being monitored and Timeout performed Patient Re-evaluated:Patient Re-evaluated prior to inductionOxygen Delivery Method: Circle system utilized Preoxygenation: Pre-oxygenation with 100% oxygen Intubation Type: IV induction Ventilation: Mask ventilation without difficulty and Oral airway inserted - appropriate to patient size Laryngoscope Size: Mac and 4 Grade View: Grade II Tube type: Oral Tube size: 8.0 mm Number of attempts: 1 Airway Equipment and Method: Stylet Placement Confirmation: ETT inserted through vocal cords under direct vision,  positive ETCO2 and breath sounds checked- equal and bilateral Secured at: 22 cm Tube secured with: Tape Dental Injury: Teeth and Oropharynx as per pre-operative assessment

## 2014-03-23 NOTE — Op Note (Signed)
CARDIOTHORACIC SURGERY OPERATIVE NOTE  Date of Procedure:  03/23/2014  Preoperative Diagnosis: Severe Aortic Stenosis   Postoperative Diagnosis: Same   Procedure:    Transcatheter Aortic Valve Replacement - Left Transfemoral Approach  Edwards Sapien 3 Transcatheter Heart Valve (size 26 mm, model # 9600TFX, serial # [id number])   Co-Surgeons:  Gaye Pollack, MD and Lauree Chandler, MD  Assistants:   Valentina Gu. Roxy Manns, MD and Sherren Mocha, MD             DETAILS OF THE OPERATIVE PROCEDURE  The majority of the procedure is documented separately in a procedure note by Dr. Angelena Form.   TRANSFEMORAL ACCESS:   A small incision is made in the left groin immediately over the common femoral artery. The subcutaneous tissues are divided with electrocautery and the anterior surface of the common femoral artery is identified. Sharp dissection is utilized to free up the artery proximally and distally and the vessel is encircled with a vessel loop.  A pair of CV-4 Gore-tex sutures are place as diamond-shaped purse-strings on the anterior surface of the femoral artery.  The patient is heparinized systemically and ACT verified > 250 seconds.  The common femoral artery is punctured using an 18 gauge needle and a soft J-tipped guidewire is passed into the common iliac artery under fluoroscopic guidance.  A 6 Fr straight diagnostic catheter is placed over the guidewire and the guidewire is removed.  An Amplatz super stiff guidewire is passed through the sheath into the descending thoracic aorta and the introducing diagnostic catheter is removed.  Serial dilators are passed over the guidewire under continuous fluoroscopic guidance, making certain that each dilator passes easily all of the way into the distal abdominal aorta.  A 14 Fr transfemoral E- sheath is passed over the guidewire into the abdominal aorta.  The introducing dilator is removed, the sheath is flushed with heparinized saline, and the  sheath is secured to the skin.    FEMORAL SHEATH REMOVAL AND ARTERIAL CLOSURE:  After the completion of successful valve deployment as documented separately by Dr. Burt Knack, the femoral artery sheath is removed and the arteriotomy is closed using the previously placed Gore-tex purse-string sutures. Once the repair has been completed protamine was administered to reverse the anticoagulation. A digitally-subtracted arteriogram is obtained from just above the aortic bifurcation to below the arteriotomy to confirm the integrity of the vascular repair.  The incision is irrigated with saline solution and subsequently closed in multiple layers using absorbable suture.  The skin incision is closed using a subcuticular skin closure.     Gaye Pollack, MD 03/23/2014 8:49 PM

## 2014-03-23 NOTE — Anesthesia Postprocedure Evaluation (Signed)
  Anesthesia Post-op Note  Patient: [name]  Procedure(s) Performed: Procedure(s): TRANSCATHETER AORTIC VALVE REPLACEMENT, TRANSFEMORAL (N/A) TRANSESOPHAGEAL ECHOCARDIOGRAM (TEE) (N/A)  Patient Location: SICU  Anesthesia Type:General  Level of Consciousness: awake, alert  and oriented  Airway and Oxygen Therapy: Patient Spontanous Breathing and Patient connected to nasal cannula oxygen  Post-op Pain: none  Post-op Assessment: Post-op Vital signs reviewed, Patient's Cardiovascular Status Stable and Respiratory Function Stable  Post-op Vital Signs: Reviewed and stable  Last Vitals:  Filed Vitals:   03/23/14 1342  BP:   Pulse: 57  Temp:   Resp:     Complications: No apparent anesthesia complications

## 2014-03-23 NOTE — H&P (Signed)
[address]       [place], [address]             [phone]      Cardiothoracic Surgery History and Physical   Referring Provider is Sinclair Grooms, MD PCP is Darlin Coco, MD  Chief Complaint  Patient presents with  . Severe aortic stenosis        HPI:  The patient is a 81 year old gentleman with a history of coronary disease who underwent CABG in 1982 in [place] and redo CABG in 1994 by Dr. Redmond Pulling. He has had multiple PCI's by Dr. Martinique. He had a cardiac arrest on 08/28/09 while playing tennis and was successfully resuscitated with cooling and had a good functional recovery. He had a cath on 09/06/2009 that showed no lesions amenable to PCI and had an ICD placed ( Medtronic). He had recurrent PMVT in February 2015 while playing golf and says he was shocked twice but did not report it and this was just recently found in his ICD record. He has a known history of aortic stenosis and his last echo was on 01/12/2013 showing moderate aortic stenosis with all three leaflets moderately calcified and restricted leaflet mobility. The mean gradient was 35 mm Hg and the peak was 61 mm Hg. The AVA was 1.57 cm2 by Vmax and 1.51 by VTI. LVEF was 55-60%. He was seen last month by Dr. Mare Ferrari complaining of exertional tightness and pain in his throat which has always been his anginal symptom. He says this has been present for a long time and occuring with less exertion. He had a high risk Myoview on 01/28/2014 with the same symptom developing during the test with a large, reversible defect involving the entire anterior wall, apex, mid and basal anteroseptum and anterolateral wall with evidence of ischemic dilation. The EF was 32% with diffuse hypokinesis and septal dyskinesis. He underwent cardiac cath on 02/01/2014 which showed that his left main, LAD, LCX, and RCA are all occluded with patent vein grafts and a patent LIMA to the LAD. The LAD is severely and  diffusely diseased both proximal and distal to the graft insertion. The is nothing to intervene on. The EF is 35-40%. The peak to peak aortic valve gradient was 31 mm Hg and the mean was 26 mm Hg with an AVA by Fick of 1.06 cm2 and by thermodilution 0.77 cm2. His most recent echo shows that the aortic valve is calcified and has restricted mobility but the mean gradient is only 29 mm Hg, which is similar to his echo last year and similar to the cath gradients ( peak to peak of 31 mm Hg and mean of 26 mm Hg). His EF is 55-60%. The dimensionless index is 0.23. The valve area was estimated at 0.71 cm2.  He says that he gets the throat tightness with significant exertion like going up stairs or hills, walking the golf course, lifting heavy thinks. He has been avoiding doing things that bring on the tightness but would like to be more active. He also has some exertional fatigue and has to rest a lot according to his wife. He has not had any significant shortness of breath or peripheral edema.   Past Medical History  Diagnosis Date  . Cardiac arrest     aborted, atrial fibrillation/flutter  . Presence of automatic cardioverter/defibrillator (AICD)     Medtronic, remote-yes.   . Coronary artery disease   .  Aortic stenosis   . Peripheral vascular disease   . Essential hypertension   . Hyperlipidemia   . Angina pectoris     Recurrent angina pectoris  . Claudication     Past Surgical History  Procedure Laterality Date  . Cabg      in 1982 and '84 with subsequent PTCI  . Appendectomy    . Tonsillectomy    . Adenoidectomy    . Cataract surgery      Bilateral cataract surgery  . Left elbow tendon surgery    . Cardiac catheterization  11/16/2009     Family History  Problem Relation Age of Onset  . Coronary artery disease Other     family history is dignificant for early CAD in several members     History   Social History  . Marital Status: Married    Spouse Name: N/A    Number of Children: N/A  . Years of Education: N/A   Occupational History  . Not on file.   Social History Main Topics  . Smoking status: Former Smoker    Quit date: 10/25/1952  . Smokeless tobacco: Not on file  . Alcohol Use: No  . Drug Use: No  . Sexual Activity: Not on file   Other Topics Concern  . Not on file   Social History Narrative    Current Outpatient Prescriptions  Medication Sig Dispense Refill  . amLODipine (NORVASC) 5 MG tablet TAKE 1 TABLET BY MOUTH EVERY EVENING. 90 tablet 2  . aspirin 81 MG tablet Take 81 mg by mouth daily.     . clopidogrel (PLAVIX) 75 MG tablet Take 75 mg by mouth daily.    . Coenzyme Q10 (CO Q 10 PO) Take 1 tablet by mouth daily.     Marland Kitchen FLUZONE HIGH-DOSE 0.5 ML SUSY   0  . furosemide (LASIX) 40 MG tablet Take 40 mg by mouth daily.    . isosorbide mononitrate (IMDUR) 60 MG 24 hr tablet Take 60 mg by mouth every morning.    Marland Kitchen KRILL OIL PO Take 1 tablet by mouth daily.    Marland Kitchen latanoprost (XALATAN) 0.005 % ophthalmic solution Place 1 drop into both eyes at bedtime.     Marland Kitchen lisinopril (PRINIVIL,ZESTRIL) 20 MG tablet Take 20 mg by mouth 2 (two) times daily.    . Magnesium Oxide (MAG-OXIDE PO) Take 1 tablet by mouth daily.    . metoprolol (LOPRESSOR) 100 MG tablet Take 50 mg by mouth every morning.    . metoprolol (LOPRESSOR) 50 MG tablet Take 25 mg tablet in the evening    . Multiple Vitamin (MULTIVITAMIN) tablet Take 1 tablet by mouth daily.     . niacin (SLO-NIACIN) 500 MG tablet Take 500 mg by mouth at bedtime.     . potassium chloride (K-DUR,KLOR-CON) 10 MEQ tablet Take 10 mEq by mouth 2 (two) times daily.     Marland Kitchen PREVNAR 13 SUSP injection   0  . rosuvastatin (CRESTOR) 20 MG tablet Take 20 mg by mouth daily.     . Thiamine HCl (VITAMIN B-1) 100 MG tablet Take 100 mg by mouth daily.     . diazepam (VALIUM) 5 MG tablet Take 5 mg by mouth daily as needed for anxiety.    . nitroGLYCERIN (NITROSTAT) 0.4 MG SL tablet Place 1 tablet (0.4 mg total) under the tongue every 5 (five) minutes as needed. 25 tablet 1   No current facility-administered medications for this visit.    Allergies  Allergen  Reactions  . Cyclobenzaprine Hcl     Unspecified reaction  . Tetracycline     Rash       Review of Systems:  General:normal appetite, decreased energy, no weight gain, no weight loss, no fever Cardiac: has throat tightness with exertion, no chest pain at rest, noSOB with exertion, no resting SOB, no PND, no orthopnea, no palpitations, no arrhythmia, history of atrial fibrillation and flutter, no LE edema, no dizzy spells, no syncope Respiratory:no shortness of breath, no home oxygen, no productive cough, no dry cough, no bronchitis, no wheezing, no hemoptysis, no asthma, no pain with inspiration or cough, no sleep apnea, no CPAP at night GI:no difficulty swallowing, no reflux, no frequent heartburn, no hiatal hernia, no abdominal pain, no constipation, no diarrhea, no hematochezia, no hematemesis, no melena GU:no dysuria, has frequency, no urinary tract infection, no hematuria, no enlarged prostate, no kidney stones, no kidney disease Vascular:no pain suggestive of claudication, no pain in feet, no leg cramps, no varicose veins, no DVT, no non-healing foot ulcer Neuro:no stroke, no TIA's, no seizures, no headaches, notemporary blindness one eye, no slurred speech, no peripheral neuropathy, no chronic pain, no instability of  gait, no memory/cognitive dysfunction Musculoskeletal:has arthritis, no joint swelling, no myalgias, no difficulty walking, no mobility  Skin:no rash, no itching, no skin infections, no pressure sores or ulcerations Psych:no anxiety, no depression, no nervousness, no unusual recent stress Eyes:Left eye blurry vision, no floaters, no recent vision changes, wears glasses UVO:ZDGU ear hearing loss, no loose or painful teeth, no dentures, last saw dentist a few months ago Hematologic:has easy bruising, no abnormal bleeding, no clotting disorder, no frequent epistaxis Endocrine:no diabetes, does not check CBG's at home       Physical Exam:  BP 136/80 mmHg  Pulse 80  Ht 5\' 6"  (1.676 m)  Wt 151 lb (68.493 kg)  BMI 24.38 kg/m2  SpO2 97% General: well-appearing gentleman in no distress HEENT:Unremarkable, NCAT, PERLA, EOMI, throat clear. Neck:no JVD, bruit or transmitted murmur to left neck, no adenopathy or thyromegaly. Chest:clear to auscultation, symmetrical breath sounds, no wheezes, no rhonchi  CV:RRR, grade III/VI crescendo/decrescendo murmur heard best at RSB, no diastolic murmur Abdomen:soft, non-tender, no masses or organomegaly Extremities:warm, well-perfused, pulses palpable bilat, no LE edema, scars both legs from vein harvests Rectal/GUDeferred Neuro:Grossly non-focal and symmetrical  throughout Skin:Clean and dry, no rashes, no breakdown   Diagnostic Tests:  [facility]          [address], [place] [address]            [phone]  ------------------------------------------------------------ Transthoracic Echocardiography  Patient:  [name], [name] MR #:    [id number] Study Date: 01/12/2013 Gender:   M Age:    70 Height:   167.6cm Weight:   68kg BSA:    1.63m^2 Pt. Status: Room:  ATTENDING  [place], [place], [facility] SONOGRAPHER Victorio Palm, RDCS ORDERING   Virl Axe Md REFERRING  Virl Axe Md cc:  ------------------------------------------------------------ LV EF: 55% -  60%  ------------------------------------------------------------ Indications:   Aortic stenosis /insufficiency 424.1. Cardiomyopathy - ischemic 414.8.  ------------------------------------------------------------ History:  PMH: PVD. Acquired from the patient and from the patient's chart. 3/6 Systolic murmur at left sternal border. Atrial fibrillation. Ischemic cardiomyopathy, with an ejection fraction of 60%by echocardiography. The dysfunction is primarily systolic. Borderline significant aortic stenosis. PMH:  Sudden death/cardiac arrest episode. Risk factors: Hypertension.  ------------------------------------------------------------ Study  Conclusions  - Left ventricle: The cavity size was normal. Wall thickness was normal. Systolic function was normal. The estimated ejection fraction was in the range of 55% to 60%. Wall motion was normal; there were no regional wall motion abnormalities. Doppler parameters are consistent with abnormal left ventricular relaxation (grade 1 diastolic dysfunction). - Aortic valve: Valve mobility was restricted. There was moderate stenosis.  Trivial regurgitation. Mean gradient: 19mm Hg (S). Peak gradient: 18mm Hg (S). - Mitral valve: Calcified annulus. - Left atrium: The atrium was moderately dilated. - Right ventricle: The cavity size was mildly dilated. - Right atrium: The atrium was mildly dilated. - Pulmonary arteries: Systolic pressure was mildly increased. PA peak pressure: 55mm Hg (S).  ------------------------------------------------------------ Labs, prior tests, procedures, and surgery: Echocardiography (October 2013).  The aortic valve showed moderate to severe stenosis. EF was 60%. Aortic valve: peak gradient of 3mm Hg and mean gradient of 57mm Hg.  ICD system implantation.  Currently implanted device: implantable cardioverter defibrillator. Coronary artery bypass grafting. Coronary artery bypass grafting. Transthoracic echocardiography. M-mode, complete 2D, spectral Doppler, and color Doppler. Height: Height: 167.6cm. Height: 66in. Weight: Weight: 68kg. Weight: 149.7lb. Body mass index: BMI: 24.2kg/m^2. Body surface area:  BSA: 1.48m^2. Blood pressure:   103/61. Patient status: Outpatient. Location: [facility]  ------------------------------------------------------------  ------------------------------------------------------------ Left ventricle: The cavity size was normal. Wall thickness was normal. Systolic function was normal. The estimated ejection fraction was in the range of 55% to 60%. Wall motion was normal; there were no regional wall motion abnormalities. Doppler parameters are consistent with abnormal left ventricular relaxation (grade 1 diastolic dysfunction).  ------------------------------------------------------------ Aortic valve:  Trileaflet; moderately calcified leaflets. Valve mobility was restricted. Doppler:  There was moderate stenosis.  Trivial regurgitation.  VTI ratio of LVOT to aortic valve: 0.28. Valve area:  1.51cm^2(VTI). Indexed valve area: 0.85cm^2/m^2 (VTI). Peak velocity ratio of LVOT to aortic valve: 0.29. Valve area: 1.57cm^2 (Vmax). Indexed valve area: 0.88cm^2/m^2 (Vmax).  Mean gradient: 77mm Hg (S). Peak gradient: 74mm Hg (S).  ------------------------------------------------------------ Aorta: Aortic root: The aortic root was normal in size.  ------------------------------------------------------------ Mitral valve:  Calcified annulus. Mobility was not restricted. Doppler: Transvalvular velocity was within the normal range. There was no evidence for stenosis. Trivial regurgitation.  Peak gradient: 38mm Hg (D).  ------------------------------------------------------------ Left atrium: The atrium was moderately dilated.  ------------------------------------------------------------ Right ventricle: The cavity size was mildly dilated. Pacer wire or catheter noted in right ventricle. Systolic function was normal.  ------------------------------------------------------------ Pulmonic valve:  Doppler: Transvalvular velocity was within the normal range. There was no evidence for stenosis.  ------------------------------------------------------------ Tricuspid valve:  Structurally normal valve.  Doppler: Transvalvular velocity was within the normal range. Trivial regurgitation.  ------------------------------------------------------------ Pulmonary artery:  Systolic pressure was mildly increased.  ------------------------------------------------------------ Right atrium: The atrium was mildly dilated. Pacer wire or catheter noted in right atrium.  ------------------------------------------------------------ Pericardium: There was no pericardial effusion.  ------------------------------------------------------------ Systemic veins: Inferior vena cava: The vessel was normal in size.  ------------------------------------------------------------  2D  measurements    Normal Doppler measurements  Normal Left ventricle         Main pulmonary LVID ED,  52.3 mm   43-52  artery chord,             Pressure,  32 mm Hg =30 PLAX              S LVID ES,  40.3 mm   23-38  Left ventricle chord,  Ea, lat  8.44 cm/s  ------ PLAX              ann, tiss FS, chord,  23 %   >29   DP PLAX              E/Ea, lat 9.64    ------ LVPW, ED  10.5 mm   ------ ann, tiss IVS/LVPW  1.01    <1.3  DP ratio, ED           Ea, med   5.7 cm/s  ------ Ventricular septum       ann, tiss IVS, ED  10.6 mm   ------ DP LVOT              E/Ea, med 14.2    ------ Diam, S   26 mm   ------ ann, tiss   8 Area    5.31 cm^2  ------ DP Diam     26 mm   ------ LVOT Aorta             Peak vel,  115 cm/s  ------ Root diam,  31 mm   ------ S ED               VTI, S   28.4 cm   ------ AAo AP    34 mm   ------ Peak     5 mm Hg ------ diam, S            gradient, Left atrium          S AP dim    48 mm   ------ Stroke vol 150. ml   ------ AP dim   2.71 cm/m^2 <2.2         8 index             Stroke   85.2 ml/m^2 ------                index                Aortic valve                Peak vel,  390 cm/s  ------                S                Mean vel,  277 cm/s  ------                S                VTI, S   100 cm   ------                Mean     35 mm Hg ------                gradient,                S                Peak     61 mm Hg ------                gradient,                 S                VTI ratio 0.28    ------  LVOT/AV                Area, VTI 1.51 cm^2  ------                Area index 0.85 cm^2/m ------                (VTI)      ^2                Peak vel  0.29    ------                ratio,                LVOT/AV                Area, Vmax 1.57 cm^2  ------                Area index 0.88 cm^2/m ------                (Vmax)     ^2                Mitral valve                Peak E vel 81.4 cm/s  ------                Peak A vel 81.9 cm/s  ------                Decelerati 183 ms   150-23                on time        0                Peak     3 mm Hg ------                gradient,                D                Peak E/A   1    ------                ratio                Tricuspid valve                Regurg   259 cm/s  ------                peak vel                Peak RV-RA  27 mm Hg ------                gradient,                S                Systemic veins                Estimated   5 mm Hg ------                CVP                Right ventricle                Pressure,  32 mm Hg <30  S                Sa vel,  12.1 cm/s  ------                lat ann,                tiss DP  ------------------------------------------------------------ Prepared and Electronically  Authenticated by  Kirk Ruths 2014-10-06T12:48:12.800    Left Heart Catheterization with Coronary Angiography , Bypass Angio, and Abdominal Angiogram Report  [name]  80 y.o.  male 11/18/1933  Procedure Date: 02/01/2014 Referring Physician: Darlin Coco, M.D. Primary Cardiologist: Darlin Coco, M.D.  INDICATIONS: Progressive angina and severe aortic stenosis  PROCEDURE: 1. Left heart catheterization; 2. Left ventriculography; 3. Coronary angiography; 4. Bypass graft angiography; 5. Left internal mammary graft angiography; 6. Abdominal aortography  CONSENT:  The risks, benefits, and details of the procedure were explained in detail to the patient. Risks including death, stroke, heart attack, kidney injury, allergy, limb ischemia, bleeding and radiation injury were discussed. The patient verbalized understanding and wanted to proceed. Informed written consent was obtained.  PROCEDURE TECHNIQUE: After Xylocaine anesthesia a 5 French sheath was placed in the right femoral artery using the modified Seldinger technique. A 7 French sheath was placed in the right femoral vein using the Seldinger technique. 2% Xylocaine was used for local anesthesia. Coronary angiography was done using a 5 F a to MP, JR4, JL 3.5 cm, and Internal Mammary diagnostic catheters. Left ventriculography was done using the JR4 catheter and hand injection. Abdominal aortography was performed using an angled pigtail catheter and power injection at 18 cc/s for a total of 40 cc of contrast.  Digital images were reviewed. Hemodynamics were reviewed. The case was terminated. Manual compression was used for hemostasis.  CONTRAST: Total of 139 cc.  COMPLICATIONS: None   HEMODYNAMICS: Aortic pressure 112/54 mmHg; LV pressure 143 over 9 mmHg; LVEDP 16 mmHg; RA 5 mmHg; RV 24 over 8 mmHg; PA 26 over 13 mmHg; PCWP(mean) 10 mmHg; Cardiac Output Fick 4.97 L/m; thermodilution 3.62 L/m; AV gradient 31  mmHg peak to peak and 26 mmHg mean gradients; aortic valve area using Fick cardiac output 1.06 cm; aortic valve area using the thermodilution cardiac output 0.77 cm.  ANGIOGRAPHIC DATA: The left main coronary artery is heavily calcified and totally occluded.  The left anterior descending artery is totally occluded and supplied only by the left internal mammary.  The left circumflex artery is totally occluded.  The right coronary artery is totally occluded at the ostium.  BYPASS GRAFT ANGIOGRAPHY: Saphenous vein graft to the obtuse marginal: Widely patent with luminal irregularities. A proximal/ostial stent and a mid body stent is widely patent.  Saphenous vein graft to the right coronary: contains luminal irregularities but no significant obstruction is noted  Internal mammary graft to the LAD: Widely patent. The native left anterior descending is severely and diffusely diseased. There is 90% stenosis antegrade and also 90% stenosis retrograde. The apical LAD is also severely diseased. Collaterals to the right ventricular branch of the RCA arise from the LAD.  ABDOMINAL AORTOGRAPHY: Demonstrated 70% right renal artery stenosis. 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 There is 40-50% ostial left renal artery stenosis. The left internal iliac contains 90% stenosis. The right internal iliac is also 50% narrowing.   LEFT VENTRICULOGRAM: Left ventricular angiogram was done in the 30 RAO projection and revealed a normal left ventricular cavity size with an estimated ejection fraction of 35-40%. No obvious  mitral regurgitation was noted.   IMPRESSIONS: 1. Patent bypass grafts, very similar to the last heart catheterization in 2011. The grafts include a saphenous vein graft to the right coronary, saphenous vein graft to the obtuse marginal. The left internal mammary graft to the LAD is widely patent. 2. The left anterior descending is severely and diffusely diseased, both antegrade and  retrograde from the graft insertion site. This is likely the source of the patient's anginal complaints. No focal sites for intervention exist. 3. Severe aortic stenosis, calcific 4. Left ventricular systolic dysfunction with ejection fraction 35-40%. No obvious mitral regurgitation is noted.   RECOMMENDATION: Preferred a heart valve clinic for consideration of TAVR..          Transthoracic Echocardiography  Patient:  [name], [name] MR #:    [id number] Study Date: 02/12/2014 Gender:   M Age:    82 Height:   167.6 cm Weight:   70.8 kg BSA:    1.83 m^2 Pt. Status: Room:  ATTENDING  Gilford Raid, MD [place], MD [place], MD PERFORMING  [facility], Outpatient SONOGRAPHER Darlina Sicilian, RDCS  cc:  ------------------------------------------------------------------- LV EF: 55% -  60%  ------------------------------------------------------------------- Indications:   Aortic Stenosis (I35.0).  ------------------------------------------------------------------- History:  PMH: Ischemic Cardiomyopathy. Atrial fibrillation. Aortic valve disease.  ------------------------------------------------------------------- Study Conclusions  - Left ventricle: E/e&quot;>16.9 suggestive of elevated LV filling pressures. The cavity size was mildly dilated. Wall thickness was normal. Systolic function was normal. The estimated ejection fraction was in the range of 55% to 60%. Wall motion was normal; there were no regional wall motion abnormalities. There was an increased relative contribution of atrial contraction to ventricular filling. Doppler parameters are consistent with abnormal left ventricular relaxation (grade 1 diastolic dysfunction). - Aortic valve: Severely calcified annulus. Trileaflet. Severe thickening and calcification. Cusp separation was reduced. There was moderate stenosis.  There was mild regurgitation. - Left atrium: The atrium was mildly dilated. - Right ventricle: The cavity size was mildly dilated. Wall thickness was normal.  ------------------------------------------------------------------- Labs, prior tests, procedures, and surgery: Coronary artery bypass grafting.  Transthoracic echocardiography. M-mode, complete 2D, spectral Doppler, and color Doppler. Birthdate: Patient birthdate: [birth year]. Age: Patient is 80 yr old. Sex: Gender: male. BMI: 25.2 kg/m^2. Blood pressure:   120/64 Patient status: Inpatient. Study date: Study date: 02/12/2014. Study time: 02:10 PM. Location: Echo laboratory.  -------------------------------------------------------------------  ------------------------------------------------------------------- Left ventricle: E/e&quot;>16.9 suggestive of elevated LV filling pressures. The cavity size was mildly dilated. Wall thickness was normal. Systolic function was normal. The estimated ejection fraction was in the range of 55% to 60%. Wall motion was normal; there were no regional wall motion abnormalities. There was an increased relative contribution of atrial contraction to ventricular filling. Doppler parameters are consistent with abnormal left ventricular relaxation (grade 1 diastolic dysfunction).  ------------------------------------------------------------------- Aortic valve: AV is thickened, calcified with moderately restricted motion Peak and mean gradients through the vlave are 50 and 30 mm Hg respectively consistent with moderate AS. Severely calcified annulus. Trileaflet. Severe thickening and calcification. Cusp separation was reduced. Doppler:  There was moderate stenosis.  There was mild regurgitation.  VTI ratio of LVOT to aortic valve: 0.28. Valve area (VTI): 0.87 cm^2. Indexed valve area (VTI): 0.48 cm^2/m^2. Valve area (Vmax): 0.93 cm^2. Indexed valve area (Vmax): 0.51  cm^2/m^2. Mean velocity ratio of LVOT to aortic valve: 0.23. Valve area (Vmean): 0.71 cm^2. Indexed valve area (Vmean): 0.39 cm^2/m^2.  Mean gradient (S): 29 mm Hg. Peak gradient (S): 50 mm Hg.  -------------------------------------------------------------------  Mitral valve:  Calcified annulus. Mildly thickened leaflets . Doppler: There was trivial regurgitation.  Peak gradient (D): 3 mm Hg.  ------------------------------------------------------------------- Left atrium: The atrium was mildly dilated.  ------------------------------------------------------------------- Right ventricle: The cavity size was mildly dilated. Wall thickness was normal. Pacer wire or catheter noted in right ventricle. Systolic function was normal.  ------------------------------------------------------------------- Pulmonic valve:  Structurally normal valve.  Cusp separation was normal. Doppler: Transvalvular velocity was within the normal range. There was trivial regurgitation.  ------------------------------------------------------------------- Tricuspid valve:  Structurally normal valve.  Leaflet separation was normal. Doppler: Transvalvular velocity was within the normal range. There was mild regurgitation.  ------------------------------------------------------------------- Right atrium: The atrium was normal in size. Pacer wire or catheter noted in right atrium.  ------------------------------------------------------------------- Pericardium: A trivial pericardial effusion was identified posterior to the heart.  ------------------------------------------------------------------- Measurements  Left ventricle              Value     Reference LV ID, ED, PLAX chordal      (H)   52.6 mm    43 - 52 LV ID, ES, PLAX chordal          36.4 mm    23 - 38 LV fx shortening, PLAX chordal      31  %    >=29 LV PW thickness,  ED            8.81 mm    --------- IVS/LV PW ratio, ED            0.93      <=1.3 Stroke volume, 2D             72  ml    --------- Stroke volume/bsa, 2D           39  ml/m^2  --------- LV e&', lateral              5   cm/s   --------- LV E/e&', lateral             16.88     --------- LV e&', medial               5.55 cm/s   --------- LV E/e&', medial              15.21     --------- LV e&', average              5.28 cm/s   --------- LV E/e&', average             16       ---------  Ventricular septum            Value     Reference IVS thickness, ED             8.19 mm    ---------  LVOT                   Value     Reference LVOT ID, S                20  mm    --------- LVOT area                 3.14 cm^2   --------- LVOT peak velocity, S           105  cm/s   --------- LVOT mean velocity, S           58.2 cm/s   --------- LVOT VTI, S  23  cm    --------- LVOT peak gradient, S           4   mm Hg  ---------  Aortic valve               Value     Reference Aortic valve mean velocity, S       256  cm/s   --------- Aortic valve VTI, S            82.6 cm    --------- Aortic mean gradient, S          29  mm Hg  --------- Aortic peak gradient, S          50  mm Hg  --------- VTI ratio, LVOT/AV            0.28      --------- Aortic valve area, VTI          0.87 cm^2   --------- Aortic valve area/bsa, VTI        0.48 cm^2/m^2 --------- Aortic valve area, peak velocity     0.93 cm^2   --------- Aortic valve area/bsa,  peak        0.51 cm^2/m^2 --------- velocity Velocity ratio, mean, LVOT/AV       0.23      --------- Aortic valve area, mean velocity     0.71 cm^2   --------- Aortic valve area/bsa, mean        0.39 cm^2/m^2 --------- velocity  Aorta                   Value     Reference Aortic root ID, ED            29  mm    ---------  Left atrium                Value     Reference LA ID, A-P, ES              47  mm    --------- LA ID/bsa, A-P          (H)   2.57 cm/m^2  <=2.2 LA volume, S               63.2 ml    --------- LA volume/bsa, S             34.6 ml/m^2  --------- LA volume, ES, 1-p A4C          62  ml    --------- LA volume/bsa, ES, 1-p A4C        34  ml/m^2  --------- LA volume, ES, 1-p A2C          56.7 ml    --------- LA volume/bsa, ES, 1-p A2C        31  ml/m^2  ---------  Mitral valve               Value     Reference Mitral E-wave peak velocity        84.4 cm/s   --------- Mitral A-wave peak velocity        97.8 cm/s   --------- Mitral deceleration time         201  ms    150 - 230 Mitral peak gradient, D          3   mm Hg  --------- Mitral E/A ratio, peak          0.9      ---------  Pulmonary arteries            Value     Reference PA pressure, S, DP            19  mm Hg  <=30  Tricuspid valve              Value     Reference Tricuspid regurg peak velocity      198  cm/s   --------- Tricuspid peak RV-RA gradient       16  mm Hg  ---------  Systemic veins              Value     Reference Estimated CVP               3   mm Hg  ---------  Right  ventricle              Value     Reference RV pressure, S, DP            19  mm Hg  <=30 RV s&', lateral, S             4.38 cm/s   ---------  Legend: (L) and (H) mark values outside specified reference range.  ------------------------------------------------------------------- Prepared and Electronically Authenticated by  Fransico Him, MD 2015-11-06T23:35:16   ADDENDUM REPORT: 03/12/2014 14:58  CLINICAL DATA: Aortic Stenosis Pre TAVR  EXAM: Cardiac TAVR CT  TECHNIQUE: The patient was scanned on a Philips 256 scanner. A 120 kV retrospective scan was triggered in the descending thoracic aorta at 111 HU's. Gantry rotation speed was 270 msecs and collimation was .9 mm. No beta blockade or nitro were given. The 3D data set was reconstructed in 5% intervals of the R-R cycle. Systolic and diastolic phases were analyzed on a dedicated work station using MPR, MIP and VRT modes. The patient received 80 cc of contrast.  Note IR Radiology needed to start appropriate IV for case  FINDINGS: Aortic Valve: Trileaflet and heavily calcified.  Sinotubular Junction: 2.5 cm with heavy calcification  Ascending Thoracic Aorta: 3.1 cm  Aortic Arch: 2.7 cm  Descending Thoracic Aorta: 2.6 cm  Sinus of Valsalva Measurements:  Non-coronary: 29.5 mm  Right -coronary: 29 mm  Left -coronary: 30.8 mm  Coronary Artery Height above Annulus:  Left Main: 13.6 mm  Right Coronary: 14.8 mm  Virtual Basal Annulus Measurements:  Maximum/Minimum Diameter: 29.5 x 21 mm  Perimeter: 80 mm  Area: 466 mm2  Coronary Arteries: The native coronary arteries are occluded The ostium are heavily calcified The SVG to RCA is patent. The SVG to OM is patent and appears to be stented. The LIMA to the LAD is patent  Optimum Fluoroscopic Angle for Delivery: LAO 3 degrees Cranial 1 degree  IMPRESSION: 1) Heavily  calcified trileaflet aortic valve suitable for 26 mm Sapien XT valve  2) Unfavorable characteristics for TAVR include large area of calcification of the sinotubular junction. Heavy annular calcification between the non and left coronary cusps and dense extensive  Subvalvular calcification extending into the intervalvular fibrosa and base of anterior mitral leaflet  3) Coronary heights sufficient for deployment but circulation fed by grafts anyway  4) Moderate calcification of aortic Kartch with no large atheroma and normal origin of great vessels  5) Optimum angiographic angle for delivery LAO 3 degrees and Cranial 1 degree  6) Occluded native arteries with patient SVG RCA and OM and patent LIMA to LAD  Jenkins Rouge   Electronically Signed  By: Jenkins Rouge M.D.  On: 03/12/2014 14:58      Study Result     EXAM: OVER-READ INTERPRETATION CT CHEST  The following report is an over-read performed by radiologist Dr. Rebekah [facility] on 03/11/2014. This over-read does not include interpretation of cardiac or coronary anatomy or pathology. The coronary calcium score/coronary CTA interpretation by the cardiologist is attached.  COMPARISON: Chest CT 04/24/2010.  FINDINGS: Within the visualized portions of the thorax there are no suspicious appearing pulmonary nodules or masses, there is no acute consolidative airspace disease, no pneumothorax and no pleural effusion. No pathologically enlarged mediastinal or hilar lymph nodes are noted. Small hiatal hernia. Visualized portions of the upper abdomen are unremarkable. There are no aggressive appearing lytic or blastic lesions noted in the visualized portions of the skeleton. Post vertebroplasty changes are noted in the T8 vertebral body.  IMPRESSION: 1. No significant incidental noncardiac findings noted. 2. Incidental findings, as above.  Electronically Signed: By:  Vinnie Langton M.D. On: 03/11/2014 16:23      CLINICAL DATA: 80 year old male with severe aortic stenosis. Preprocedural study prior to potential transcatheter aortic valve replacement (TAVR) procedure.  EXAM: CTA ABDOMEN AND PELVIS WITH CONTRAST  TECHNIQUE: Multidetector CT imaging of the abdomen and pelvis was performed using the standard protocol during bolus administration of intravenous contrast. Multiplanar reconstructed images and MIPs were obtained and reviewed to evaluate the vascular anatomy.  CONTRAST: 77mL OMNIPAQUE IOHEXOL 350 MG/ML SOLN  COMPARISON: None.  FINDINGS: CTA ABDOMEN AND PELVIS FINDINGS  Lower chest: Pacemaker lead in the right ventricular apex. Atherosclerotic calcifications in the right coronary artery. Median sternotomy wires. Small hiatal hernia.  Hepatobiliary: Well-defined 2.1 x 2.2 cm low-attenuation lesion in segment 4B of the liver adjacent to the gallbladder fossa, compatible with a simple cyst. Remaining portions of the visualized liver (dome of the right lobe of the liver was incompletely visualized) are normal in appearance. No intra or extrahepatic biliary ductal dilatation. Gallbladder is normal in appearance.  Pancreas: Unremarkable.  Spleen: Unremarkable.  Adrenals/Urinary Tract: Bilateral adrenal glands are normal in appearance. There are multiple subcentimeter low attenuation lesions in the kidneys bilaterally which are too small to definitively characterize (statistically favored to represent small cysts). In addition, there are multiple larger well-defined low-attenuation nonenhancing lesions, compatible with simple cysts, the largest of which is exophytic measuring 5.2 cm in the anterior aspect of the upper pole of the left kidney. No hydroureteronephrosis. Urinary bladder is unremarkable in appearance.  Stomach/Bowel: The appearance of the stomach is normal. Large diverticulum from the second portion  of the duodenum incidentally noted. No pathologic dilatation of small bowel or colon. Numerous colonic diverticulae are, particularly in the sigmoid colon, without surrounding inflammatory changes to suggest an acute diverticulitis at this time.  Vascular/Lymphatic: Vascular findings and measurements pertinent to potential TAVR procedure, as detailed below. In addition, there is a stent in the proximal left superficial femoral artery which is grossly patent (but incompletely visualized). No definite lymphadenopathy in the abdomen or pelvis.  Reproductive: Prostate gland and seminal vesicles are unremarkable in appearance.  Other: Small amount of abnormal soft tissue in the left lower quadrant immediately adjacent to the left inguinal ring, with some soft tissue thickening tracking into the left inguinal canal, strongly favored to represent a postoperative change related to prior left inguinal herniorrhaphy. No significant volume of ascites. No pneumoperitoneum.  Musculoskeletal: There are no aggressive appearing lytic or blastic lesions noted in the visualized portions of the skeleton.  VASCULAR  MEASUREMENTS PERTINENT TO TAVR:  AORTA:  Minimal Aortic Diameter - 14 x 12 mm  Severity of Aortic Calcification - Severe and circumferential  RIGHT PELVIS:  Right Common Iliac Artery -  Minimal Diameter - 4.7 x 2.2 mm  Tortuosity - moderate  Calcification - severe  Right External Iliac Artery -  Minimal Diameter - 6.6 x 7.3 mm  Tortuosity - mild  Calcification - mild  Right Common Femoral Artery -  Minimal Diameter - 6.8 x 6.8 mm  Tortuosity - mild  Calcification - moderate  LEFT PELVIS:  Left Common Iliac Artery -  Minimal Diameter - 8.6 x 4.7 mm  Tortuosity - moderate  Calcification - severe  Left External Iliac Artery -  Minimal Diameter - 7.6 x 7.0 mm  Tortuosity - mild  Calcification - mild  Left Common Femoral  Artery -  Minimal Diameter - 5.8 x 5.6 mm  Tortuosity - mild  Calcification - moderate  Review of the MIP images confirms the above findings.  IMPRESSION: 1. Vascular findings and measurements pertinent to potential TAVR procedure, as detailed above. This patient does not have suitable pelvic arterial access on the right side. On the left side, depending on the location of cannulation of the left femoral artery, vascular access may be reasonable (the superior aspect of the left common femoral artery is greater than 6 mm average diameter, but the mean diameter in the distal left common femoral artery is only 5.7 mm). However, there is extensive atherosclerosis in the left common iliac artery with nearly circumferential calcification and mean diameter of only 6.6 mm. Clinical correlation based on planned size of transcatheter aortic valve implant is recommended. 2. Additional incidental findings, as above.    Electronically Signed  By: Vinnie Langton M.D.  On: 03/11/2014 17:09       Impression:   Severe symptomatic aortic stenosis with underlying ischemic cardiomyopathy and long-standing severe three-vessel coronary artery disease. He presents with progressive symptoms of exertional tightness in his upper chest and throat consistent with stable angina pectoris. He also admits some exertional shortness of breath and progressive exertional fatigue consistent with chronic diastolic congestive heart failure, [place] Heart Association functional class II.TAVR is felt to be the best option for him given his two prior bypass graft surgeries and calcified aortic root.  Plan:   TAVR with a 26 mm Edwards Sapien 3 valve via a left transfemoral route with a backup of transapical route if there is any difficulty passing the sheath. The patient has been advised of a variety of complications that might develop including but not limited to risks of death, stroke, paravalvular  leak, aortic dissection or other major vascular complications, aortic annulus rupture, device embolization, cardiac rupture or perforation, mitral regurgitation, acute myocardial infarction, arrhythmia, heart block or bradycardia requiring permanent pacemaker placement, congestive heart failure, respiratory failure, renal failure, pneumonia, infection, other late complications related to structural valve deterioration or migration, or other complications that might ultimately cause a temporary or permanent loss of functional independence or other long term morbidity. The patient provides full informed consent for the procedure as described and all questions were answered.

## 2014-03-23 NOTE — Interval H&P Note (Signed)
History and Physical Interval Note:  03/23/2014 10:55 AM  [name]  has presented today for surgery, with the diagnosis of SEVERE AS  The various methods of treatment have been discussed with the patient and family. After consideration of risks, benefits and other options for treatment, the patient has consented to  Procedure(s): TRANSCATHETER AORTIC VALVE REPLACEMENT, TRANSFEMORAL (N/A) TRANSCATHETER AORTIC VALVE REPLACEMENT, TRANSAPICAL (N/A) TRANSESOPHAGEAL ECHOCARDIOGRAM (TEE) (N/A) as a surgical intervention .  The patient's history has been reviewed, patient examined, no change in status, stable for surgery.  I have reviewed the patient's chart and labs.  Questions were answered to the patient's satisfaction.     Gaye Pollack

## 2014-03-23 NOTE — Progress Notes (Signed)
  Echocardiogram Echocardiogram Transesophageal has been performed.  Darlina Sicilian M 03/23/2014, 2:21 PM

## 2014-03-23 NOTE — Anesthesia Preprocedure Evaluation (Addendum)
Anesthesia Evaluation  Patient identified by MRN, date of birth, ID band Patient awake    Reviewed: Allergy & Precautions, H&P , NPO status , Patient's Chart, lab work & pertinent test results, reviewed documented beta blocker date and time   History of Anesthesia Complications Negative for: history of anesthetic complications  Airway Mallampati: III  TM Distance: <3 FB Neck ROM: Full    Dental  (+) Teeth Intact, Chipped,    Pulmonary neg shortness of breath, neg sleep apnea, neg COPDneg recent URI, former smoker,  breath sounds clear to auscultation        Cardiovascular hypertension, Pt. on home beta blockers and Pt. on medications + angina + CAD, + Past MI and + Peripheral Vascular Disease + Cardiac Defibrillator + Valvular Problems/Murmurs AS Rhythm:Regular     Neuro/Psych  Headaches, negative psych ROS   GI/Hepatic Neg liver ROS, hiatal hernia,   Endo/Other  negative endocrine ROS  Renal/GU negative Renal ROS     Musculoskeletal  (+) Arthritis -, Osteoarthritis,    Abdominal   Peds  Hematology negative hematology ROS (+)   Anesthesia Other Findings   Reproductive/Obstetrics                            Anesthesia Physical Anesthesia Plan  ASA: III  Anesthesia Plan: General   Post-op Pain Management:    Induction: Intravenous  Airway Management Planned: Oral ETT  Additional Equipment: Arterial line, TEE, CVP, Ultrasound Guidance Line Placement and PA Cath  Intra-op Plan:   Post-operative Plan: Extubation in OR and Possible Post-op intubation/ventilation  Informed Consent: I have reviewed the patients History and Physical, chart, labs and discussed the procedure including the risks, benefits and alternatives for the proposed anesthesia with the patient or authorized representative who has indicated his/her understanding and acceptance.   Dental advisory given  Plan Discussed  with: CRNA and Surgeon  Anesthesia Plan Comments:         Anesthesia Quick Evaluation

## 2014-03-24 ENCOUNTER — Inpatient Hospital Stay (HOSPITAL_COMMUNITY): Payer: Medicare Other

## 2014-03-24 ENCOUNTER — Encounter (HOSPITAL_COMMUNITY): Payer: Self-pay | Admitting: Cardiovascular Disease

## 2014-03-24 DIAGNOSIS — I350 Nonrheumatic aortic (valve) stenosis: Principal | ICD-10-CM

## 2014-03-24 LAB — GLUCOSE, CAPILLARY
GLUCOSE-CAPILLARY: 115 mg/dL — AB (ref 70–99)
GLUCOSE-CAPILLARY: 99 mg/dL (ref 70–99)
GLUCOSE-CAPILLARY: 109 mg/dL — AB (ref 70–99)
Glucose-Capillary: 120 mg/dL — ABNORMAL HIGH (ref 70–99)
Glucose-Capillary: 129 mg/dL — ABNORMAL HIGH (ref 70–99)
Glucose-Capillary: 131 mg/dL — ABNORMAL HIGH (ref 70–99)

## 2014-03-24 LAB — BASIC METABOLIC PANEL
ANION GAP: 10 (ref 5–15)
BUN: 20 mg/dL (ref 6–23)
CALCIUM: 9.1 mg/dL (ref 8.4–10.5)
CO2: 23 mEq/L (ref 19–32)
Chloride: 108 mEq/L (ref 96–112)
Creatinine, Ser: 0.9 mg/dL (ref 0.50–1.35)
GFR, EST NON AFRICAN AMERICAN: 79 mL/min — AB (ref >90)
GLUCOSE: 122 mg/dL — AB (ref 70–99)
POTASSIUM: 4.1 meq/L (ref 3.7–5.3)
Sodium: 141 mEq/L (ref 137–147)

## 2014-03-24 LAB — CBC
HCT: 33.2 % — ABNORMAL LOW (ref 39.0–52.0)
Hemoglobin: 11.3 g/dL — ABNORMAL LOW (ref 13.0–17.0)
MCH: 32.4 pg (ref 26.0–34.0)
MCHC: 34 g/dL (ref 30.0–36.0)
MCV: 95.1 fL (ref 78.0–100.0)
PLATELETS: 91 10*3/uL — AB (ref 150–400)
RBC: 3.49 MIL/uL — ABNORMAL LOW (ref 4.22–5.81)
RDW: 13.4 % (ref 11.5–15.5)
WBC: 5.1 10*3/uL (ref 4.0–10.5)

## 2014-03-24 LAB — MAGNESIUM: MAGNESIUM: 2 mg/dL (ref 1.5–2.5)

## 2014-03-24 LAB — TYPE AND SCREEN
ABO/RH(D): O NEG
Antibody Screen: NEGATIVE
UNIT DIVISION: 0
Unit division: 0

## 2014-03-24 MED FILL — Potassium Chloride Inj 2 mEq/ML: INTRAVENOUS | Qty: 40 | Status: AC

## 2014-03-24 MED FILL — Magnesium Sulfate Inj 50%: INTRAMUSCULAR | Qty: 10 | Status: AC

## 2014-03-24 MED FILL — Heparin Sodium (Porcine) Inj 1000 Unit/ML: INTRAMUSCULAR | Qty: 30 | Status: AC

## 2014-03-24 NOTE — Progress Notes (Signed)
UR Completed.  [phone]  

## 2014-03-24 NOTE — Progress Notes (Signed)
1 Day Post-Op Procedure(s) (LRB): TRANSCATHETER AORTIC VALVE REPLACEMENT, TRANSFEMORAL (N/A) TRANSESOPHAGEAL ECHOCARDIOGRAM (TEE) (N/A) Subjective:  No complaints  Objective: Vital signs in last 24 hours: Temp:  [96.8 F (36 C)-98.6 F (37 C)] 98.1 F (36.7 C) (12/16 0800) Pulse Rate:  [54-69] 59 (12/16 0800) Cardiac Rhythm:  [-] A-V Sequential paced (12/16 0800) Resp:  [8-22] 18 (12/16 0800) BP: (82-125)/(36-66) 111/44 mmHg (12/16 0800) SpO2:  [90 %-100 %] 96 % (12/16 0800) Arterial Line BP: (98-136)/(40-54) 119/44 mmHg (12/16 0800) Weight:  [71.668 kg (158 lb)-73.8 kg (162 lb 11.2 oz)] 73.8 kg (162 lb 11.2 oz) (12/16 0600)  Hemodynamic parameters for last 24 hours: PAP: (32-50)/(15-27) 37/23 mmHg CO:  [3.5 L/min-4.3 L/min] 3.5 L/min CI:  [1.9 L/min/m2-2.4 L/min/m2] 1.9 L/min/m2  Intake/Output from previous day: 12/15 0701 - 12/16 0700 In: 2473.2 [P.O.:240; I.V.:2133.2; IV Piggyback:100] Out: 1480 [Urine:1480] Intake/Output this shift:    General appearance: alert and cooperative Neurologic: intact Heart: regular rate and rhythm, S1, S2 normal, no murmur, click, rub or gallop Lungs: clear to auscultation bilaterally Extremities: extremities normal, atraumatic, no cyanosis or edema Wound: left groin incision looks good.  Lab Results:  Recent Labs  03/23/14 1540 03/24/14 0400  WBC 5.6 5.1  HGB 11.0*  11.6* 11.3*  HCT 32.8*  34.0* 33.2*  PLT 95* 91*   BMET:  Recent Labs  03/23/14 1427 03/23/14 1540 03/24/14 0400  NA 142 143 141  K 3.9 3.9 4.1  CL 108  --  108  CO2  --   --  23  GLUCOSE 117* 94 122*  BUN 21  --  20  CREATININE 0.90  --  0.90  CALCIUM  --   --  9.1    PT/INR:  Recent Labs  03/23/14 1540  LABPROT 16.3*  INR 1.29   ABG    Component Value Date/Time   PHART 7.346* 03/23/2014 1545   HCO3 22.4 03/23/2014 1545   TCO2 24 03/23/2014 1545   ACIDBASEDEF 3.0* 03/23/2014 1545   O2SAT 96.0 03/23/2014 1545   CBG (last 3)   Recent  Labs  03/23/14 1920 03/23/14 2257 03/24/14 0750  GLUCAP 131* 116* 99   CXR ok  ECG: A-paced 60 with RBBB and LAFB.  Assessment/Plan: S/P Procedure(s) (LRB): TRANSCATHETER AORTIC VALVE REPLACEMENT, TRANSFEMORAL (N/A) TRANSESOPHAGEAL ECHOCARDIOGRAM (TEE) (N/A)  He is doing well following TAVR yesterday. ICD is active.  Remove lines and foley and transfer to 2W  ASA and Plavix  2D echo today.   LOS: 1 day    BARTLE,BRYAN K 03/24/2014

## 2014-03-24 NOTE — Progress Notes (Signed)
Called lab for status on CBC that was sent at 0400 as still not resulted.  Kim in lab advised she would check on status.  Will continue to monitor for results.

## 2014-03-24 NOTE — Progress Notes (Signed)
     SUBJECTIVE: Feels great this am. No chest pain or SOB.   BP 91/41 mmHg  Pulse 59  Temp(Src) 97.5 F (36.4 C) (Core (Comment))  Resp 14  Ht 5' 6.75" (1.695 m)  Wt 162 lb 11.2 oz (73.8 kg)  BMI 25.69 kg/m2  SpO2 96%  Intake/Output Summary (Last 24 hours) at 03/24/14 0816 Last data filed at 03/24/14 0600  Gross per 24 hour  Intake 2473.22 ml  Output   1305 ml  Net 1168.22 ml    PHYSICAL EXAM General: Well developed, well nourished, in no acute distress. Alert and oriented x 3.  Psych:  Good affect, responds appropriately Neck: No JVD. No masses noted.  Lungs: Clear bilaterally with no wheezes or rhonci noted.  Heart: RRR with no murmurs noted. Abdomen: Bowel sounds are present. Soft, non-tender.  Extremities: No lower extremity edema.   LABS: Basic Metabolic Panel:  Recent Labs  03/23/14 1427 03/23/14 1540 03/24/14 0400  NA 142 143 141  K 3.9 3.9 4.1  CL 108  --  108  CO2  --   --  23  GLUCOSE 117* 94 122*  BUN 21  --  20  CREATININE 0.90  --  0.90  CALCIUM  --   --  9.1  MG  --   --  2.0   CBC:  Recent Labs  03/23/14 1540 03/24/14 0400  WBC 5.6 5.1  HGB 11.0*  11.6* 11.3*  HCT 32.8*  34.0* 33.2*  MCV 92.9 95.1  PLT 95* 91*   Current Meds: . acetaminophen  1,000 mg Oral 4 times per day   Or  . acetaminophen (TYLENOL) oral liquid 160 mg/5 mL  1,000 mg Per Tube 4 times per day  . aspirin EC  81 mg Oral Daily   Or  . aspirin  324 mg Per Tube Daily  . cefUROXime (ZINACEF)  IV  1.5 g Intravenous Q12H  . Chlorhexidine Gluconate Cloth  6 each Topical Daily  . clopidogrel  75 mg Oral Daily  . famotidine (PEPCID) IV  20 mg Intravenous Q12H  . furosemide  40 mg Oral Daily  . insulin aspart  0-24 Units Subcutaneous 6 times per day  . insulin regular  0-10 Units Intravenous TID WC  . latanoprost  1 drop Both Eyes QHS  . metoprolol tartrate  12.5 mg Oral BID   Or  . metoprolol tartrate  12.5 mg Per Tube BID  . mupirocin ointment  1 application  Nasal BID  . pantoprazole  40 mg Oral Daily  . rosuvastatin  20 mg Oral q1800    ASSESSMENT AND PLAN:  1. Severe aortic valve stenosis: POD # 1 s/p TAVR. Doing well this am.  Hypotensive through the night with HR 50s but BP and HR stable this am. Will hold metoprolol today.  Continue ASA and Plavix. D/C PA catheter and arterial line. Echo later today. Transfer to telemetry unit if stable this afternoon.   MCALHANY,CHRISTOPHER  12/16/20158:16 AM

## 2014-03-25 DIAGNOSIS — I369 Nonrheumatic tricuspid valve disorder, unspecified: Secondary | ICD-10-CM

## 2014-03-25 LAB — CBC
HCT: 35 % — ABNORMAL LOW (ref 39.0–52.0)
Hemoglobin: 11.5 g/dL — ABNORMAL LOW (ref 13.0–17.0)
MCH: 30.8 pg (ref 26.0–34.0)
MCHC: 32.9 g/dL (ref 30.0–36.0)
MCV: 93.8 fL (ref 78.0–100.0)
Platelets: 87 10*3/uL — ABNORMAL LOW (ref 150–400)
RBC: 3.73 MIL/uL — AB (ref 4.22–5.81)
RDW: 13.3 % (ref 11.5–15.5)
WBC: 5.8 10*3/uL (ref 4.0–10.5)

## 2014-03-25 LAB — BASIC METABOLIC PANEL
Anion gap: 13 (ref 5–15)
BUN: 21 mg/dL (ref 6–23)
CO2: 24 meq/L (ref 19–32)
Calcium: 9.8 mg/dL (ref 8.4–10.5)
Chloride: 107 mEq/L (ref 96–112)
Creatinine, Ser: 0.97 mg/dL (ref 0.50–1.35)
GFR calc Af Amer: 89 mL/min — ABNORMAL LOW (ref >90)
GFR calc non Af Amer: 77 mL/min — ABNORMAL LOW (ref >90)
Glucose, Bld: 101 mg/dL — ABNORMAL HIGH (ref 70–99)
Potassium: 4.4 mEq/L (ref 3.7–5.3)
Sodium: 144 mEq/L (ref 137–147)

## 2014-03-25 LAB — GLUCOSE, CAPILLARY
GLUCOSE-CAPILLARY: 100 mg/dL — AB (ref 70–99)
GLUCOSE-CAPILLARY: 106 mg/dL — AB (ref 70–99)
GLUCOSE-CAPILLARY: 181 mg/dL — AB (ref 70–99)
Glucose-Capillary: 78 mg/dL (ref 70–99)
Glucose-Capillary: 96 mg/dL (ref 70–99)

## 2014-03-25 MED ORDER — POLYETHYLENE GLYCOL 3350 17 G PO PACK
17.0000 g | PACK | Freq: Every day | ORAL | Status: DC
  Administered 2014-03-25 – 2014-03-26 (×2): 17 g via ORAL
  Filled 2014-03-25 (×2): qty 1

## 2014-03-25 NOTE — Progress Notes (Signed)
  Echocardiogram 2D Echocardiogram has been performed.  [name] M 03/25/2014, 11:48 AM

## 2014-03-25 NOTE — Progress Notes (Signed)
    Subjective:   the patient feels well. He denies chest pain or shortness of breath. He walked the halls without symptoms today.  Objective:  Vital Signs in the last 24 hours: Temp:  [98.2 F (36.8 C)-99 F (37.2 C)] 98.2 F (36.8 C) (12/17 1310) Pulse Rate:  [59-68] 68 (12/17 1310) Resp:  [18] 18 (12/17 1310) BP: (116-124)/(45-47) 124/45 mmHg (12/17 1310) SpO2:  [91 %-96 %] 96 % (12/17 1310) Weight:  [160 lb 11.2 oz (72.893 kg)] 160 lb 11.2 oz (72.893 kg) (12/17 0339)  Intake/Output from previous day: 12/16 0701 - 12/17 0700 In: 300 [P.O.:250; IV Piggyback:50] Out: 225 [Urine:225]  Physical Exam: Pt is alert and oriented,  Pleasant elderly male inNAD HEENT: normal Neck: JVP - normal Lungs: CTA bilaterally CV: RRR with soft systolic ejection murmur at the left sternal border Abd: soft, NT, Positive BS, no hepatomegaly Ext: no C/C/E, distal pulses intact and equal,  Bilateral groin sites are clear. Skin: warm/dry no rash   Lab Results:  Recent Labs  03/24/14 0400 03/25/14 0323  WBC 5.1 5.8  HGB 11.3* 11.5*  PLT 91* 87*    Recent Labs  03/24/14 0400 03/25/14 0323  NA 141 144  K 4.1 4.4  CL 108 107  CO2 23 24  GLUCOSE 122* 101*  BUN 20 21  CREATININE 0.90 0.97   No results for input(s): TROPONINI in the last 72 hours.  Invalid input(s): CK, MB  Cardiac Studies:  2-D echocardiogram: Study Conclusions  - Left ventricle: The cavity size was normal. Wall thickness was normal. Systolic function was normal. The estimated ejection fraction was in the range of 60% to 65%. Wall motion was normal; there were no regional wall motion abnormalities. Doppler parameters are consistent with abnormal left ventricular relaxation (grade 1 diastolic dysfunction). The E/e&' ratio is between 8-15, suggesting indeterminate LV filling pressure. - Aortic valve: Bioprosthetic TAVR. No perivalvular leak, well-seated. Mean gradient (S): 8 mm Hg. Peak gradient  (S): 25 mm Hg. Valve area (VTI): 1.68 cm^2. Valve area (Vmax): 1.4 cm^2. Valve area (Vmean): 1.54 cm^2. - Mitral valve: Calcified annulus. - Left atrium: The atrium is moderately dilated at 46 ml/m2.  Impressions:  - Compared to the prior echo, there is now a functioning TAVR valve in place. Gradients are acceptable, however, the peak gradient is elevated. No perivalvular leak noted.  Tele:  personally reviewed, normal sinus rhythm.  Assessment/Plan:  Severe aortic stenosis, postoperative day #2 status post TAVR. The patient looks great. I think he will be ready for discharge home tomorrow morning. He will continue on dual antiplatelet therapy with aspirin and Plavix. He has an expected mild drop in his platelet count.   CAD status post CABG. No anginal symptoms at present. Will continue medical therapy.   Disposition: Anticipate discharge home tomorrow morning. Reviewed plan with the patient and his wife at the bedside this evening.  Sherren Mocha, M.D. 03/25/2014, 6:06 PM

## 2014-03-26 ENCOUNTER — Encounter (HOSPITAL_COMMUNITY): Payer: Self-pay | Admitting: Physician Assistant

## 2014-03-26 ENCOUNTER — Other Ambulatory Visit: Payer: Medicare Other

## 2014-03-26 DIAGNOSIS — I350 Nonrheumatic aortic (valve) stenosis: Secondary | ICD-10-CM | POA: Insufficient documentation

## 2014-03-26 LAB — GLUCOSE, CAPILLARY
GLUCOSE-CAPILLARY: 99 mg/dL (ref 70–99)
Glucose-Capillary: 130 mg/dL — ABNORMAL HIGH (ref 70–99)

## 2014-03-26 MED ORDER — FUROSEMIDE 40 MG PO TABS: 40.0000 mg | ORAL_TABLET | Freq: Every day | ORAL | Status: DC

## 2014-03-26 MED ORDER — LISINOPRIL 20 MG PO TABS: 20.0000 mg | ORAL_TABLET | Freq: Every day | ORAL | Status: DC

## 2014-03-26 MED ORDER — PANTOPRAZOLE SODIUM 40 MG PO TBEC: 40.0000 mg | DELAYED_RELEASE_TABLET | Freq: Every day | ORAL | Status: DC

## 2014-03-26 MED ORDER — ISOSORBIDE MONONITRATE ER 60 MG PO TB24: 30.0000 mg | ORAL_TABLET | Freq: Every morning | ORAL | Status: DC

## 2014-03-26 MED ORDER — POLYETHYLENE GLYCOL 3350 17 G PO PACK: 17.0000 g | PACK | Freq: Every day | ORAL | Status: DC

## 2014-03-26 NOTE — Progress Notes (Signed)
3 Days Post-Op Procedure(s) (LRB): TRANSCATHETER AORTIC VALVE REPLACEMENT, TRANSFEMORAL (N/A) TRANSESOPHAGEAL ECHOCARDIOGRAM (TEE) (N/A) Subjective: No complaints  Objective: Vital signs in last 24 hours: Temp:  [98.2 F (36.8 C)-98.6 F (37 C)] 98.6 F (37 C) (12/18 0650) Pulse Rate:  [67-75] 67 (12/18 0650) Cardiac Rhythm:  [-] Atrial paced (12/17 1945) Resp:  [18] 18 (12/18 0650) BP: (124-154)/(45-56) 154/56 mmHg (12/18 0650) SpO2:  [94 %-97 %] 97 % (12/18 0650) Weight:  [71.442 kg (157 lb 8 oz)] 71.442 kg (157 lb 8 oz) (12/18 2947)  Hemodynamic parameters for last 24 hours:    Intake/Output from previous day: 12/17 0701 - 12/18 0700 In: 740 [P.O.:740] Out: -  Intake/Output this shift:    General appearance: alert and cooperative Neurologic: intact Heart: regular rate and rhythm, S1, S2 normal, no murmur, click, rub or gallop Lungs: clear to auscultation bilaterally Wound: left groin incision ok  Lab Results:  Recent Labs  03/24/14 0400 03/25/14 0323  WBC 5.1 5.8  HGB 11.3* 11.5*  HCT 33.2* 35.0*  PLT 91* 87*   BMET:  Recent Labs  03/24/14 0400 03/25/14 0323  NA 141 144  K 4.1 4.4  CL 108 107  CO2 23 24  GLUCOSE 122* 101*  BUN 20 21  CREATININE 0.90 0.97  CALCIUM 9.1 9.8    PT/INR:  Recent Labs  03/23/14 1540  LABPROT 16.3*  INR 1.29   ABG    Component Value Date/Time   PHART 7.346* 03/23/2014 1545   HCO3 22.4 03/23/2014 1545   TCO2 24 03/23/2014 1545   ACIDBASEDEF 3.0* 03/23/2014 1545   O2SAT 96.0 03/23/2014 1545   CBG (last 3)   Recent Labs  03/25/14 1622 03/25/14 2141 03/26/14 0648  GLUCAP 78 96 99    Assessment/Plan: S/P Procedure(s) (LRB): TRANSCATHETER AORTIC VALVE REPLACEMENT, TRANSFEMORAL (N/A) TRANSESOPHAGEAL ECHOCARDIOGRAM (TEE) (N/A)  Doing well Ready for discharge today I will see in 2 weeks to check incision   LOS: 3 days    [name] K 03/26/2014

## 2014-03-26 NOTE — Progress Notes (Signed)
     SUBJECTIVE: No chest pain or SOB  BP 154/56 mmHg  Pulse 67  Temp(Src) 98.6 F (37 C) (Oral)  Resp 18  Ht 5\' 6"  (1.676 m)  Wt 157 lb 8 oz (71.442 kg)  BMI 25.43 kg/m2  SpO2 97%  Intake/Output Summary (Last 24 hours) at 03/26/14 0730 Last data filed at 03/25/14 1630  Gross per 24 hour  Intake    740 ml  Output      0 ml  Net    740 ml    PHYSICAL EXAM General: Well developed, well nourished, in no acute distress. Alert and oriented x 3.  Psych:  Good affect, responds appropriately Neck: No JVD. No masses noted.  Lungs: Clear bilaterally with no wheezes or rhonci noted.  Heart: RRR with no murmurs noted. Abdomen: Bowel sounds are present. Soft, non-tender.  Extremities: No lower extremity edema.   LABS: Basic Metabolic Panel:  Recent Labs  03/24/14 0400 03/25/14 0323  NA 141 144  K 4.1 4.4  CL 108 107  CO2 23 24  GLUCOSE 122* 101*  BUN 20 21  CREATININE 0.90 0.97  CALCIUM 9.1 9.8  MG 2.0  --    CBC:  Recent Labs  03/24/14 0400 03/25/14 0323  WBC 5.1 5.8  HGB 11.3* 11.5*  HCT 33.2* 35.0*  MCV 95.1 93.8  PLT 91* 87*   Current Meds: . acetaminophen  1,000 mg Oral 4 times per day   Or  . acetaminophen (TYLENOL) oral liquid 160 mg/5 mL  1,000 mg Per Tube 4 times per day  . aspirin EC  81 mg Oral Daily   Or  . aspirin  324 mg Per Tube Daily  . Chlorhexidine Gluconate Cloth  6 each Topical Daily  . clopidogrel  75 mg Oral Daily  . furosemide  40 mg Oral Daily  . insulin aspart  0-24 Units Subcutaneous 6 times per day  . insulin regular  0-10 Units Intravenous TID WC  . latanoprost  1 drop Both Eyes QHS  . mupirocin ointment  1 application Nasal BID  . pantoprazole  40 mg Oral Daily  . polyethylene glycol  17 g Oral Daily  . rosuvastatin  20 mg Oral q1800   Echo 03/25/14: Left ventricle: The cavity size was normal. Wall thickness was normal. Systolic function was normal. The estimated ejection fraction was in the range of 60% to 65%. Wall  motion was normal; there were no regional wall motion abnormalities. Doppler parameters are consistent with abnormal left ventricular relaxation (grade 1 diastolic dysfunction). The E/e&' ratio is between 8-15, suggesting indeterminate LV filling pressure. - Aortic valve: Bioprosthetic TAVR. No perivalvular leak, well-seated. Mean gradient (S): 8 mm Hg. Peak gradient (S): 25 mm Hg. Valve area (VTI): 1.68 cm^2. Valve area (Vmax): 1.4 cm^2. Valve area (Vmean): 1.54 cm^2. - Mitral valve: Calcified annulus. - Left atrium: The atrium is moderately dilated at 46 ml/m2. Impressions: - Compared to the prior echo, there is now a functioning TAVR valve in place. Gradients are acceptable, however, the peak gradient is elevated. No perivalvular leak noted.  ASSESSMENT AND PLAN:  1. Severe aortic valve stenosis: POD # 3 s/p TAVR. Doing well this am. Continue ASA and Plavix.   2. CAD: stable  Discharge home today. Follow up with Dr. Cyndia Bent on 04/07/14. He can see me on January 13th at 4:30pm.    MCALHANY,CHRISTOPHER  12/18/20157:30 AM

## 2014-03-26 NOTE — Discharge Summary (Signed)
CARDIOLOGY DISCHARGE SUMMARY   Patient ID: [name] MRN: [id number] DOB/AGE: 09/05/1933 80 y.o.  Admit date: 03/23/2014 Discharge date: 03/26/2014  PCP: Darlin Coco, MD Primary Cardiologist: Dr. Mare Ferrari  Primary Discharge Diagnosis:   Severe aortic valve stenosis - s/p  Edwards Sapien 3 THV (size 26 mm, model # 9600TFX, serial # [id number]) 03/23/2014  Secondary Discharge Diagnosis:  Diabetes  Consults: TCTS  Procedure: Transcatheter Aortic Valve Replacement - Transfemoral Approach Edwards Sapien 3 THV (size 26 mm, model # 9600TFX, serial # [id number]) Intraoperative TEE 2 D Echocardiogram     Hospital Course: [name] is a 80 y.o. male with a history of CAD and severe AS. He was evaluated by the TAVR team and deemed a candidate for the procedure. He came to the hospital for this on 03/23/2014.  He had an Edwards Sapien 3 THV inserted by the transfemoral approach without complication. He tolerated the procedure well.    A follow-up 2-D echocardiogram was performed, results are below. The valve was in good position with no perivalvular leak. Mean gradient of 8 mmHg.  He initially required pressors but was quickly weaned off of them. However, his blood pressure remained on the low side of normal and his home blood pressure medications were held. His Lasix was restarted without difficulty. The metoprolol is being restarted but the lisinopril and Imdur are being restarted at decreased doses. The amlodipine is remaining on hold for now. It can be resumed as an outpatient.  His blood sugars were controlled with sliding scale insulin and he tolerated this well.  His activity level was gradually increased and he did well with this. He improved steadily.  On 12/18, he was seen by Dr. Cyndia Bent and by Dr. Angelena Form. He was ambulating without chest pain or shortness of breath and follow-up appointments were arranged. No further inpatient workup  was indicated and he is considered stable for discharge, to follow up as an outpatient.  Labs:   Lab Results  Component Value Date   WBC 5.8 03/25/2014   HGB 11.5* 03/25/2014   HCT 35.0* 03/25/2014   MCV 93.8 03/25/2014   PLT 87* 03/25/2014    Recent Labs Lab 03/19/14 1418  03/25/14 0323  NA 139  < > 144  K 4.8  < > 4.4  CL 104  < > 107  CO2 21  < > 24  BUN 25*  < > 21  CREATININE 0.94  < > 0.97  CALCIUM 10.3  < > 9.8  PROT 6.9  --   --   BILITOT 0.4  --   --   ALKPHOS 48  --   --   ALT 13  --   --   AST 18  --   --   GLUCOSE 110*  < > 101*  < > = values in this interval not displayed.   Recent Labs  03/23/14 1540  INR 1.29      Radiology:  Dg Chest Port 1 View 03/24/2014   CLINICAL DATA:  Status post transcatheter aortic valve replacement.  EXAM: PORTABLE CHEST - 1 VIEW  COMPARISON:  03/23/2014.  FINDINGS: Trachea is midline. Heart is mildly enlarged, stable. Right IJ Swan-Ganz catheter tip projects over the proximal right pulmonary artery. Left subclavian pacemaker and ICD lead tips project over the right atrium and right ventricle, stable. Aortic valve prosthesis is in place. Lungs are somewhat low in volume with mild bibasilar volume loss. No definite pleural fluid. Old left  rib fractures.  IMPRESSION: Low lung volumes with mild bibasilar atelectasis.   Electronically Signed   By: Lorin Picket M.D.   On: 03/24/2014 07:57   Dg Chest Port 1 View 03/23/2014   CLINICAL DATA:  Status post TAVR.  EXAM: PORTABLE CHEST - 1 VIEW  COMPARISON:  Chest radiograph 03/19/2014  FINDINGS: Valve projects in the expected location of aortic valve. Multi lead AICD device overlies the left hemi thorax, leads are stable in position. Monitoring leads overlie the patient. Interval insertion of pulmonary arterial catheter with tip projecting over the expected location of the main pulmonary artery. Status post median sternotomy. Pulmonary vascular redistribution. No large consolidative  pulmonary opacities. Minimal left basilar atelectasis.  IMPRESSION: Pulmonary arterial catheter projects over the expected location of the main pulmonary artery.  Left basilar atelectasis.   Electronically Signed   By: Lovey Newcomer M.D.   On: 03/23/2014 16:58   EKG: 03/24/2014 Atrial paced, rate 60  Echo: 03/25/2014 Study Conclusions - Left ventricle: The cavity size was normal. Wall thickness was normal. Systolic function was normal. The estimated ejection fraction was in the range of 60% to 65%. Wall motion was normal; there were no regional wall motion abnormalities. Doppler parameters are consistent with abnormal left ventricular relaxation (grade 1 diastolic dysfunction). The E/e&' ratio is between 8-15, suggesting indeterminate LV filling pressure. - Aortic valve: Bioprosthetic TAVR. No perivalvular leak, well-seated. Mean gradient (S): 8 mm Hg. Peak gradient (S): 25 mm Hg. Valve area (VTI): 1.68 cm^2. Valve area (Vmax): 1.4 cm^2. Valve area (Vmean): 1.54 cm^2. - Mitral valve: Calcified annulus. - Left atrium: The atrium is moderately dilated at 46 ml/m2. Impressions: - Compared to the prior echo, there is now a functioning TAVR valve in place. Gradients are acceptable, however, the peak gradient is elevated. No perivalvular leak noted.  FOLLOW UP PLANS AND APPOINTMENTS Allergies  Allergen Reactions  . Cyclobenzaprine Hcl     Unspecified reaction  . Tetracycline     Rash      Medication List    STOP taking these medications        amLODipine 5 MG tablet  Commonly known as:  NORVASC      TAKE these medications        aspirin 81 MG tablet  Take 81 mg by mouth daily.     clopidogrel 75 MG tablet  Commonly known as:  PLAVIX  Take 75 mg by mouth daily.     CO Q 10 PO  Take 1 tablet by mouth daily.     diazepam 5 MG tablet  Commonly known as:  VALIUM  Take 5 mg by mouth daily as needed for anxiety.     furosemide 40 MG tablet  Commonly  known as:  LASIX  Take 1 tablet (40 mg total) by mouth daily.     isosorbide mononitrate 60 MG 24 hr tablet  Commonly known as:  IMDUR  Take 0.5 tablets (30 mg total) by mouth every morning.     KRILL OIL PO  Take 1 tablet by mouth daily.     latanoprost 0.005 % ophthalmic solution  Commonly known as:  XALATAN  Place 1 drop into both eyes at bedtime.     lisinopril 20 MG tablet  Commonly known as:  PRINIVIL,ZESTRIL  Take 1 tablet (20 mg total) by mouth daily.     MAG-OXIDE PO  Take 1 tablet by mouth daily.     metoprolol 100 MG tablet  Commonly known as:  LOPRESSOR  Take 50 mg by mouth every morning.     metoprolol 50 MG tablet  Commonly known as:  LOPRESSOR  Take 25 mg tablet in the evening     multivitamin tablet  Take 1 tablet by mouth daily.     niacin 500 MG tablet  Commonly known as:  SLO-NIACIN  Take 500 mg by mouth at bedtime.     nitroGLYCERIN 0.4 MG SL tablet  Commonly known as:  NITROSTAT  Place 1 tablet (0.4 mg total) under the tongue every 5 (five) minutes as needed.     pantoprazole 40 MG tablet  Commonly known as:  PROTONIX  Take 1 tablet (40 mg total) by mouth daily.     polyethylene glycol packet  Commonly known as:  MIRALAX / GLYCOLAX  Take 17 g by mouth daily.     potassium chloride 10 MEQ tablet  Commonly known as:  K-DUR,KLOR-CON  Take 10 mEq by mouth 2 (two) times daily.     rosuvastatin 20 MG tablet  Commonly known as:  CRESTOR  Take 20 mg by mouth daily.     thiamine 100 MG tablet  Commonly known as:  VITAMIN B-1  Take 100 mg by mouth daily.        Discharge Instructions    Diet - low sodium heart healthy    Complete by:  As directed      Diet Carb Modified    Complete by:  As directed      Increase activity slowly    Complete by:  As directed      Increase activity slowly    Complete by:  As directed           Follow-up Information    Follow up with Gaye Pollack, MD On 04/07/2014.   Specialty:  Cardiothoracic  Surgery   Why:  at 4:30 pm   Contact information:   [address] [place] [address] [phone]       Follow up with Lauree Chandler, MD On 04/21/2014.   Specialty:  Cardiology   Why:  at 4:30 pm   Contact information:   [place]. [address] [place] [address] [phone]       BRING ALL MEDICATIONS WITH YOU TO FOLLOW UP APPOINTMENTS  Time spent with patient to include physician time: 45 min Signed: Rosaria Ferries, PA-C 03/26/2014, 12:10 PM Co-Sign MD

## 2014-03-26 NOTE — Progress Notes (Signed)
Discharge instructions reviewed with patient and wife. Both verbalized understanding of medications and wound care. All questions answered and patient/wife was encouraged to call RN with any further concerns or questions. PIV removed and site WNL. Alfredo Bach RN BSN 03/26/2014 12:30 PM

## 2014-03-30 ENCOUNTER — Ambulatory Visit: Payer: Medicare Other | Admitting: Cardiology

## 2014-04-07 ENCOUNTER — Ambulatory Visit (INDEPENDENT_AMBULATORY_CARE_PROVIDER_SITE_OTHER): Payer: Medicare Other | Admitting: Surgery

## 2014-04-07 ENCOUNTER — Encounter: Payer: Self-pay | Admitting: Surgery

## 2014-04-07 VITALS — BP 149/82 | HR 88 | Resp 20 | Ht 66.0 in | Wt 154.0 lb

## 2014-04-07 DIAGNOSIS — Z954 Presence of other heart-valve replacement: Secondary | ICD-10-CM

## 2014-04-07 DIAGNOSIS — Z952 Presence of prosthetic heart valve: Secondary | ICD-10-CM

## 2014-04-07 DIAGNOSIS — I259 Chronic ischemic heart disease, unspecified: Secondary | ICD-10-CM

## 2014-04-08 ENCOUNTER — Other Ambulatory Visit: Payer: Self-pay | Admitting: *Deleted

## 2014-04-08 DIAGNOSIS — I350 Nonrheumatic aortic (valve) stenosis: Secondary | ICD-10-CM

## 2014-04-11 ENCOUNTER — Encounter: Payer: Self-pay | Admitting: Surgery

## 2014-04-11 NOTE — Progress Notes (Signed)
HPI:  Patient returns for routine postoperative follow-up having undergone left transfemoral TAVR on 03/23/2014. At the conclusion of the procedure there was mild perivalvular AI located in the region adjacent to heavy calcification in the aorto-mitral curtain.  The patient's early postoperative recovery while in the hospital was notable for an uncomplicated postop course. His postop day 1 echo showed a normal functioning valve with no AI.  Since hospital discharge the patient reports that he has continued to have exertional tightness in the neck similar to the symptoms he had preop. This is relieved with rest. He denies shortness of breath.   Current Outpatient Prescriptions  Medication Sig Dispense Refill  . aspirin 81 MG tablet Take 81 mg by mouth daily.      . clopidogrel (PLAVIX) 75 MG tablet Take 75 mg by mouth daily.    . Coenzyme Q10 (CO Q 10 PO) Take 1 tablet by mouth daily.     . diazepam (VALIUM) 5 MG tablet Take 5 mg by mouth daily as needed for anxiety.    . furosemide (LASIX) 40 MG tablet Take 1 tablet (40 mg total) by mouth daily. 90 tablet 0  . isosorbide mononitrate (IMDUR) 60 MG 24 hr tablet Take 0.5 tablets (30 mg total) by mouth every morning. 30 tablet 11  . KRILL OIL PO Take 1 tablet by mouth daily.    Marland Kitchen latanoprost (XALATAN) 0.005 % ophthalmic solution Place 1 drop into both eyes at bedtime.     Marland Kitchen lisinopril (PRINIVIL,ZESTRIL) 20 MG tablet Take 1 tablet (20 mg total) by mouth daily. 30 tablet 11  . Magnesium Oxide (MAG-OXIDE PO) Take 1 tablet by mouth daily.    . metoprolol (LOPRESSOR) 100 MG tablet Take 50 mg by mouth every morning.    . metoprolol (LOPRESSOR) 50 MG tablet Take 25 mg tablet in the evening    . Multiple Vitamin (MULTIVITAMIN) tablet Take 1 tablet by mouth daily.      . niacin (SLO-NIACIN) 500 MG tablet Take 500 mg by mouth at bedtime.      . nitroGLYCERIN (NITROSTAT) 0.4 MG SL tablet Place 1 tablet (0.4 mg total) under the tongue every 5 (five)  minutes as needed. (Patient taking differently: Place 0.4 mg under the tongue every 5 (five) minutes as needed for chest pain (MAX 3 TABLETS). ) 25 tablet 1  . pantoprazole (PROTONIX) 40 MG tablet Take 1 tablet (40 mg total) by mouth daily. 30 tablet 3  . polyethylene glycol (MIRALAX / GLYCOLAX) packet Take 17 g by mouth daily. 14 each 0  . potassium chloride (K-DUR,KLOR-CON) 10 MEQ tablet Take 10 mEq by mouth 2 (two) times daily.      . rosuvastatin (CRESTOR) 20 MG tablet Take 20 mg by mouth daily.    . Thiamine HCl (VITAMIN B-1) 100 MG tablet Take 100 mg by mouth daily.       No current facility-administered medications for this visit.    Physical Exam: BP 149/82 mmHg  Pulse 88  Resp 20  Ht 5\' 6"  (1.676 m)  Wt 154 lb (69.854 kg)  BMI 24.87 kg/m2  SpO2 97% He looks well. Lung exam is clear. Cardiac exam shows a regular rate and rhythm with a 1/6 diastolic murmur along the left sternal border. The left groin incision is healing well.  Impression/Plan:  He is making a satisfactory recovery from his procedure but continues to have angina as he had preop. He has a patent LIMA to the LAD  but the LAD is diffusely diseased. There was a significant anteroapical ischemic defect on nuclear stress test preop. This will have to be managed medically. There is a diastolic murmur that sounds like AI but his echo on POD 1 showed no AI. He is going to see Dr. Angelena Form in the next couple weeks and will have a follow up echo at that time.

## 2014-04-21 ENCOUNTER — Encounter: Payer: Self-pay | Admitting: Cardiovascular Disease

## 2014-04-21 ENCOUNTER — Ambulatory Visit (INDEPENDENT_AMBULATORY_CARE_PROVIDER_SITE_OTHER): Payer: Medicare Other | Admitting: Cardiovascular Disease

## 2014-04-21 ENCOUNTER — Ambulatory Visit (HOSPITAL_COMMUNITY): Payer: Medicare Other | Attending: Cardiovascular Disease | Admitting: Radiology

## 2014-04-21 VITALS — BP 118/60 | HR 64 | Ht 66.0 in | Wt 157.8 lb

## 2014-04-21 DIAGNOSIS — I10 Essential (primary) hypertension: Secondary | ICD-10-CM | POA: Insufficient documentation

## 2014-04-21 DIAGNOSIS — Z87891 Personal history of nicotine dependence: Secondary | ICD-10-CM | POA: Insufficient documentation

## 2014-04-21 DIAGNOSIS — I350 Nonrheumatic aortic (valve) stenosis: Secondary | ICD-10-CM

## 2014-04-21 DIAGNOSIS — I359 Nonrheumatic aortic valve disorder, unspecified: Secondary | ICD-10-CM | POA: Insufficient documentation

## 2014-04-21 NOTE — Progress Notes (Signed)
History of Present Illness: 81 yo male with history of CAD s/p CABG (1982 with redo 1994), ischemic cardiomyopathy, out-of-hospital cardiac arrest in 2011 with successful resuscitation and subsequent placement of Medtronic ICD, and progressive aortic stenosis who is now s/p TAVR on 03/23/14. He is here today for one month follow up following his TAVR. His post procedure hospitalization was uneventful.   He tells me today that he is feeling much better over the last week. He has no chest pain and no dyspnea. He is now exercising every day on the stationary bike and on the treadmilll. He denies any PND, orthopnea, or lower extremity edema. He denies dizziness, near syncope or syncope.   Primary Care Physician/Primary cardiologist: [place]  Last Lipid Profile:Lipid Panel     Component Value Date/Time   CHOL 147 11/25/2013 1035   TRIG 77.0 11/25/2013 1035   HDL 46.30 11/25/2013 1035   CHOLHDL 3 11/25/2013 1035   VLDL 15.4 11/25/2013 1035   [place] 85 11/25/2013 1035     Past Medical History  Diagnosis Date  . Cardiac arrest     aborted, atrial fibrillation/flutter  . Presence of automatic cardioverter/defibrillator (AICD)     Medtronic, remote-yes.   . Coronary artery disease   . Aortic stenosis, severe     s/p TAVR with Edwards Sapien 3 THV (size 26 mm, model # 9600TFX, serial # [id number])  . Peripheral vascular disease   . Essential hypertension   . Hyperlipidemia   . Angina pectoris      Recurrent angina pectoris  . Claudication   . Myocardial infarction   . Heart murmur   . Kidney stones   . History of hiatal hernia   . Headache   . Arthritis     Past Surgical History  Procedure Laterality Date  . Cabg      in 1982 and '84 with subsequent PTCI  . Appendectomy    . Tonsillectomy    . Adenoidectomy    . Cataract surgery       Bilateral cataract surgery  . Left elbow tendon surgery    . Cardiac catheterization   11/16/2009   . Left and right heart  catheterization with coronary/graft angiogram N/A 02/01/2014    Procedure: LEFT AND RIGHT HEART CATHETERIZATION WITH Beatrix Fetters;  Surgeon: Sinclair Grooms, MD;  Location: [facility] CATH LAB;  Service: Cardiovascular;  Laterality: N/A;  . Tonsillectomy    . Hernia repair    . Cardiac defibrillator placement    . Transcatheter aortic valve replacement, transfemoral N/A 03/23/2014    Procedure: TRANSCATHETER AORTIC VALVE REPLACEMENT, TRANSFEMORAL;  Surgeon: Burnell Blanks, MD;  Location: [facility];  Service: Open Heart Surgery;  Laterality: N/A;  . Tee without cardioversion N/A 03/23/2014    Procedure: TRANSESOPHAGEAL ECHOCARDIOGRAM (TEE);  Surgeon: Burnell Blanks, MD;  Location: [facility];  Service: Open Heart Surgery;  Laterality: N/A;    Current Outpatient Prescriptions  Medication Sig Dispense Refill  . aspirin 81 MG tablet Take 81 mg by mouth daily.      . clopidogrel (PLAVIX) 75 MG tablet Take 75 mg by mouth daily.    . Coenzyme Q10 (CO Q 10 PO) Take 1 tablet by mouth daily.     . diazepam (VALIUM) 5 MG tablet Take 5 mg by mouth daily as needed for anxiety.    . furosemide (LASIX) 40 MG tablet Take 1 tablet (40 mg total) by mouth daily. 90 tablet 0  . isosorbide mononitrate (  IMDUR) 60 MG 24 hr tablet Take 0.5 tablets (30 mg total) by mouth every morning. 30 tablet 11  . KRILL OIL PO Take 1 tablet by mouth daily.    Marland Kitchen latanoprost (XALATAN) 0.005 % ophthalmic solution Place 1 drop into both eyes at bedtime.     Marland Kitchen lisinopril (PRINIVIL,ZESTRIL) 20 MG tablet Take 1 tablet (20 mg total) by mouth daily. 30 tablet 11  . Magnesium Oxide (MAG-OXIDE PO) Take 1 tablet by mouth daily.    . metoprolol (LOPRESSOR) 100 MG tablet Take 50 mg by mouth every morning.    . metoprolol (LOPRESSOR) 50 MG tablet Take 25 mg tablet in the evening    . Multiple Vitamin (MULTIVITAMIN) tablet Take 1 tablet by mouth daily.      . niacin (SLO-NIACIN) 500 MG tablet Take 500 mg by mouth at bedtime.        . nitroGLYCERIN (NITROSTAT) 0.4 MG SL tablet Place 1 tablet (0.4 mg total) under the tongue every 5 (five) minutes as needed. (Patient taking differently: Place 0.4 mg under the tongue every 5 (five) minutes as needed for chest pain (MAX 3 TABLETS). ) 25 tablet 1  . pantoprazole (PROTONIX) 40 MG tablet Take 1 tablet (40 mg total) by mouth daily. 30 tablet 3  . polyethylene glycol (MIRALAX / GLYCOLAX) packet Take 17 g by mouth daily. 14 each 0  . potassium chloride (K-DUR,KLOR-CON) 10 MEQ tablet Take 10 mEq by mouth 2 (two) times daily.      . rosuvastatin (CRESTOR) 20 MG tablet Take 20 mg by mouth daily.    . Thiamine HCl (VITAMIN B-1) 100 MG tablet Take 100 mg by mouth daily.       No current facility-administered medications for this visit.    Allergies  Allergen Reactions  . Cyclobenzaprine Hcl     Unspecified reaction  . Tetracycline     Rash     History   Social History  . Marital Status: Married    Spouse Name: N/A    Number of Children: 3  . Years of Education: N/A   Occupational History  . Retired    Social History Main Topics  . Smoking status: Former Smoker    Quit date: 10/25/1952  . Smokeless tobacco: Not on file  . Alcohol Use: 3.0 - 3.6 oz/week    5-6 Glasses of wine per week     Comment: 5-6 times a week  . Drug Use: No  . Sexual Activity: Not on file   Other Topics Concern  . Not on file   Social History Narrative    Family History  Problem Relation Age of Onset  . Coronary artery disease Other     family history is dignificant for early CAD in several members  . Heart attack Father 32    Review of Systems:  As stated in the HPI and otherwise negative.   BP 118/60 mmHg  Pulse 64  Ht 5\' 6"  (1.676 m)  Wt 157 lb 12.8 oz (71.578 kg)  BMI 25.48 kg/m2  SpO2 95%  Physical Examination: General: Well developed, well nourished, NAD HEENT: OP clear, mucus membranes moist SKIN: warm, dry. No rashes. Neuro: No focal deficits Musculoskeletal:  Muscle strength 5/5 all ext Psychiatric: Mood and affect normal Neck: No JVD, no carotid bruits, no thyromegaly, no lymphadenopathy. Lungs:Clear bilaterally, no wheezes, rhonci, crackles Cardiovascular: Regular rate and rhythm. Soft diastolic murmur. No gallops or rubs. Abdomen:Soft. Bowel sounds present. Non-tender.  Extremities: No lower extremity edema.  Pulses are 2 + in the bilateral DP/PT.  Echo 04/21/14: Left ventricle: The cavity size was normal. Wall thickness was normal. Systolic function was normal. The estimated ejection fraction was in the range of 60% to 65%. - Mitral valve: Calcified annulus. - Left atrium: The atrium was moderately dilated. - Right atrium: The atrium was moderately dilated. - Atrial septum: No defect or patent foramen ovale was identified. - Pericardium, extracardiac: A trivial pericardial effusion was identified posterior to the heart. Aortic valve: 26 mm Sapien TAVR valve in place with mild peri prosthetic regurgitation along the aortic mitral curtain. Doppler:  VTI ratio of LVOT to aortic valve: 0.5. Peak velocity ratio of LVOT to aortic valve: 0.48. Mean velocity ratio of LVOT to aortic valve: 0.55.  Mean gradient (S): 13 mm Hg. Peak gradient (S): 24 mm Hg.  Assessment and Plan:   1. Severe Aortic valve stenosis: He had severe symptomatic aortic valve stenosis. Now s/p TAVR on  03/23/14. There is mild paravalvular leak which is unchanged by echo today. LV function is normal. He is feeling better with no chest pain and no dyspnea. Will continue ASA and Plavix. He will keep his f/u appt with Dr. Mare Ferrari in 3 months. He will be seen in valve clinic in one year.

## 2014-04-21 NOTE — Progress Notes (Signed)
Echocardiogram performed.  

## 2014-04-21 NOTE — Patient Instructions (Signed)
Your physician wants you to follow-up in:   3 months with Dr. Mare Ferrari.  You will receive a reminder letter in the mail two months in advance. If you don't receive a letter, please call our office to schedule the follow-up appointment.

## 2014-04-22 ENCOUNTER — Other Ambulatory Visit (HOSPITAL_COMMUNITY): Payer: Medicare Other

## 2014-05-11 ENCOUNTER — Telehealth: Payer: Self-pay | Admitting: Cardiovascular Disease

## 2014-05-11 NOTE — Telephone Encounter (Signed)
Request for surgical clearance:  1. What type of surgery is being performed? Left Upper Lithosis Repair  2. When is this surgery scheduled? 05/21/14  3. Are there any medications that need to be held prior to surgery and how long? Blood thinners and Aspirin if prescribed  4. Name of physician performing surgery? Dr. Laqueta Carina  5. What is your office phone and fax number? Fax Number [phone]  Office calling to check on the status of surgical clearance form request sent to our office. Please call back and advise.

## 2014-05-12 ENCOUNTER — Other Ambulatory Visit: Payer: Self-pay | Admitting: *Deleted

## 2014-05-12 DIAGNOSIS — F419 Anxiety disorder, unspecified: Secondary | ICD-10-CM

## 2014-05-12 NOTE — Telephone Encounter (Signed)
Thank you :)

## 2014-05-12 NOTE — Telephone Encounter (Signed)
Information received and waiting for  Dr. Mare Ferrari to review

## 2014-05-12 NOTE — Telephone Encounter (Signed)
Left message with Amber and requested form be refaxed, have not received at this point

## 2014-05-13 ENCOUNTER — Telehealth: Payer: Self-pay | Admitting: Cardiology

## 2014-05-13 MED ORDER — DIAZEPAM 5 MG PO TABS: 5.0000 mg | ORAL_TABLET | Freq: Every day | ORAL | Status: DC | PRN

## 2014-05-13 NOTE — Telephone Encounter (Signed)
Received request from Nurse fax box:   To: [facility]  Fax number: [phone]

## 2014-05-14 DIAGNOSIS — Z01812 Encounter for preprocedural laboratory examination: Secondary | ICD-10-CM | POA: Diagnosis not present

## 2014-05-14 NOTE — Telephone Encounter (Signed)
Patient ok'd by  Dr. Mare Ferrari for surgery, hold Plavix 5 days prior but continue Aspirin

## 2014-05-17 ENCOUNTER — Encounter: Payer: Self-pay | Admitting: Cardiology

## 2014-05-18 NOTE — Telephone Encounter (Signed)
Information faxed as requested.

## 2014-05-21 DIAGNOSIS — H02402 Unspecified ptosis of left eyelid: Secondary | ICD-10-CM | POA: Diagnosis not present

## 2014-05-21 DIAGNOSIS — I10 Essential (primary) hypertension: Secondary | ICD-10-CM | POA: Diagnosis not present

## 2014-05-21 DIAGNOSIS — Z95810 Presence of automatic (implantable) cardiac defibrillator: Secondary | ICD-10-CM | POA: Diagnosis not present

## 2014-05-21 DIAGNOSIS — I509 Heart failure, unspecified: Secondary | ICD-10-CM | POA: Diagnosis not present

## 2014-05-21 DIAGNOSIS — I2510 Atherosclerotic heart disease of native coronary artery without angina pectoris: Secondary | ICD-10-CM | POA: Diagnosis not present

## 2014-05-21 DIAGNOSIS — Z955 Presence of coronary angioplasty implant and graft: Secondary | ICD-10-CM | POA: Diagnosis not present

## 2014-05-21 DIAGNOSIS — Z7982 Long term (current) use of aspirin: Secondary | ICD-10-CM | POA: Diagnosis not present

## 2014-05-21 DIAGNOSIS — Z950 Presence of cardiac pacemaker: Secondary | ICD-10-CM | POA: Diagnosis not present

## 2014-05-21 DIAGNOSIS — Z7902 Long term (current) use of antithrombotics/antiplatelets: Secondary | ICD-10-CM | POA: Diagnosis not present

## 2014-05-25 ENCOUNTER — Telehealth: Payer: Self-pay | Admitting: Cardiology

## 2014-05-25 NOTE — Telephone Encounter (Signed)
New Message  Please put in orders for lab mad appt for April 29 per pt req lab before appt./sr

## 2014-05-26 NOTE — Telephone Encounter (Signed)
Linked orders in [employer]

## 2014-06-07 ENCOUNTER — Other Ambulatory Visit: Payer: Self-pay | Admitting: Cardiology

## 2014-06-07 DIAGNOSIS — D225 Melanocytic nevi of trunk: Secondary | ICD-10-CM | POA: Diagnosis not present

## 2014-06-07 DIAGNOSIS — L570 Actinic keratosis: Secondary | ICD-10-CM | POA: Diagnosis not present

## 2014-06-07 DIAGNOSIS — L821 Other seborrheic keratosis: Secondary | ICD-10-CM | POA: Diagnosis not present

## 2014-06-21 ENCOUNTER — Other Ambulatory Visit: Payer: Self-pay | Admitting: Cardiology

## 2014-07-05 DIAGNOSIS — M7062 Trochanteric bursitis, left hip: Secondary | ICD-10-CM | POA: Diagnosis not present

## 2014-07-05 DIAGNOSIS — M545 Low back pain: Secondary | ICD-10-CM | POA: Diagnosis not present

## 2014-07-12 ENCOUNTER — Other Ambulatory Visit: Payer: Self-pay | Admitting: Cardiology

## 2014-08-06 ENCOUNTER — Other Ambulatory Visit (INDEPENDENT_AMBULATORY_CARE_PROVIDER_SITE_OTHER): Payer: Medicare Other | Admitting: *Deleted

## 2014-08-06 DIAGNOSIS — I119 Hypertensive heart disease without heart failure: Secondary | ICD-10-CM

## 2014-08-06 DIAGNOSIS — E7800 Pure hypercholesterolemia, unspecified: Secondary | ICD-10-CM

## 2014-08-06 LAB — BASIC METABOLIC PANEL
BUN: 24 mg/dL — ABNORMAL HIGH (ref 6–23)
CO2: 27 mEq/L (ref 19–32)
CREATININE: 0.91 mg/dL (ref 0.40–1.50)
Calcium: 10.5 mg/dL (ref 8.4–10.5)
Chloride: 105 mEq/L (ref 96–112)
GFR: 85.54 mL/min (ref >60.00)
GLUCOSE: 97 mg/dL (ref 70–99)
Potassium: 4.1 mEq/L (ref 3.5–5.1)
SODIUM: 139 meq/L (ref 135–145)

## 2014-08-06 LAB — LIPID PANEL
CHOL/HDL RATIO: 4
CHOLESTEROL: 171 mg/dL (ref 0–200)
HDL: 47.9 mg/dL (ref >39.00)
LDL CALC: 103 mg/dL — AB (ref 0–99)
NonHDL: 123.1
Triglycerides: 103 mg/dL (ref 0.0–149.0)
VLDL: 20.6 mg/dL (ref 0.0–40.0)

## 2014-08-06 LAB — CBC WITH DIFFERENTIAL/PLATELET
BASOS ABS: 0 10*3/uL (ref 0.0–0.1)
BASOS PCT: 0.3 % (ref 0.0–3.0)
EOS ABS: 0.2 10*3/uL (ref 0.0–0.7)
Eosinophils Relative: 2.8 % (ref 0.0–5.0)
HCT: 40.8 % (ref 39.0–52.0)
Hemoglobin: 14 g/dL (ref 13.0–17.0)
LYMPHS ABS: 1.5 10*3/uL (ref 0.7–4.0)
LYMPHS PCT: 25.2 % (ref 12.0–46.0)
MCHC: 34.3 g/dL (ref 30.0–36.0)
MCV: 92.3 fl (ref 78.0–100.0)
Monocytes Absolute: 0.5 10*3/uL (ref 0.1–1.0)
Monocytes Relative: 8.7 % (ref 3.0–12.0)
Neutro Abs: 3.8 10*3/uL (ref 1.4–7.7)
Neutrophils Relative %: 63 % (ref 43.0–77.0)
Platelets: 149 10*3/uL — ABNORMAL LOW (ref 150.0–400.0)
RBC: 4.42 Mil/uL (ref 4.22–5.81)
RDW: 14.4 % (ref 11.5–15.5)
WBC: 6 10*3/uL (ref 4.0–10.5)

## 2014-08-06 LAB — HEPATIC FUNCTION PANEL
ALT: 15 U/L (ref 0–53)
AST: 18 U/L (ref 0–37)
Albumin: 4 g/dL (ref 3.5–5.2)
Alkaline Phosphatase: 51 U/L (ref 39–117)
BILIRUBIN DIRECT: 0.1 mg/dL (ref 0.0–0.3)
BILIRUBIN TOTAL: 0.5 mg/dL (ref 0.2–1.2)
TOTAL PROTEIN: 6.8 g/dL (ref 6.0–8.3)

## 2014-08-09 NOTE — Progress Notes (Signed)
Quick Note:  Please make copy of labs for patient visit. ______ 

## 2014-08-10 ENCOUNTER — Encounter: Payer: Self-pay | Admitting: Cardiology

## 2014-08-10 ENCOUNTER — Ambulatory Visit (INDEPENDENT_AMBULATORY_CARE_PROVIDER_SITE_OTHER): Payer: Medicare Other | Admitting: Cardiology

## 2014-08-10 VITALS — BP 114/46 | HR 80 | Ht 66.0 in | Wt 157.8 lb

## 2014-08-10 DIAGNOSIS — E7800 Pure hypercholesterolemia, unspecified: Secondary | ICD-10-CM

## 2014-08-10 DIAGNOSIS — Z954 Presence of other heart-valve replacement: Secondary | ICD-10-CM

## 2014-08-10 DIAGNOSIS — Z953 Presence of xenogenic heart valve: Secondary | ICD-10-CM

## 2014-08-10 DIAGNOSIS — Z951 Presence of aortocoronary bypass graft: Secondary | ICD-10-CM | POA: Diagnosis not present

## 2014-08-10 DIAGNOSIS — I255 Ischemic cardiomyopathy: Secondary | ICD-10-CM

## 2014-08-10 NOTE — Progress Notes (Signed)
Cardiology Office Note   Date:  08/10/2014   ID:  [name], DOB 05-Jun-1933, MRN [id number]  PCP:  Warren Danes, MD  Cardiologist: Darlin Coco MD  No chief complaint on file.     History of Present Illness: [name] is a 81 y.o. male who presents for a four-month follow-up office visit  History of Present Illness: 81 yo male with history of CAD s/p CABG (1982 with redo 1994), ischemic cardiomyopathy, out-of-hospital cardiac arrest in 2011 with successful resuscitation and subsequent placement of Medtronic ICD, and progressive aortic stenosis who is now s/p TAVR on 03/23/14. He is here today for  follow up following his TAVR. His post procedure hospitalization was uneventful.   The patient has been feeling well.  He has been playing golf.  He can play 18 holes but of course does take a cart.  He will occasionally get mild angina in his throat if he is walking uphill.  He does not have to take sublingual nitroglycerin.  He merely stops and rests.  Past Medical History  Diagnosis Date  . Cardiac arrest     aborted, atrial fibrillation/flutter  . Presence of automatic cardioverter/defibrillator (AICD)     Medtronic, remote-yes.   . Coronary artery disease   . Aortic stenosis, severe     s/p TAVR with Edwards Sapien 3 THV (size 26 mm, model # 9600TFX, serial # [id number])  . Peripheral vascular disease   . Essential hypertension   . Hyperlipidemia   . Angina pectoris      Recurrent angina pectoris  . Claudication   . Myocardial infarction   . Heart murmur   . Kidney stones   . History of hiatal hernia   . Headache   . Arthritis     Past Surgical History  Procedure Laterality Date  . Cabg      in 1982 and '84 with subsequent PTCI  . Appendectomy    . Tonsillectomy    . Adenoidectomy    . Cataract surgery       Bilateral cataract surgery  . Left elbow tendon surgery    . Cardiac catheterization   11/16/2009   . Left and right heart catheterization with  coronary/graft angiogram N/A 02/01/2014    Procedure: LEFT AND RIGHT HEART CATHETERIZATION WITH Beatrix Fetters;  Surgeon: Sinclair Grooms, MD;  Location: [facility] CATH LAB;  Service: Cardiovascular;  Laterality: N/A;  . Tonsillectomy    . Hernia repair    . Cardiac defibrillator placement    . Transcatheter aortic valve replacement, transfemoral N/A 03/23/2014    Procedure: TRANSCATHETER AORTIC VALVE REPLACEMENT, TRANSFEMORAL;  Surgeon: Burnell Blanks, MD;  Location: [facility];  Service: Open Heart Surgery;  Laterality: N/A;  . Tee without cardioversion N/A 03/23/2014    Procedure: TRANSESOPHAGEAL ECHOCARDIOGRAM (TEE);  Surgeon: Burnell Blanks, MD;  Location: [facility];  Service: Open Heart Surgery;  Laterality: N/A;     Current Outpatient Prescriptions  Medication Sig Dispense Refill  . amLODipine (NORVASC) 5 MG tablet Take 1 tablet by mouth daily.    Marland Kitchen aspirin 81 MG tablet Take 81 mg by mouth daily.      . clopidogrel (PLAVIX) 75 MG tablet Take 75 mg by mouth daily.    . Coenzyme Q10 (CO Q 10 PO) Take 1 tablet by mouth daily.     . CRESTOR 20 MG tablet TAKE 1 TABLET BY MOUTH DAILY. 90 tablet 1  . diazepam (VALIUM) 5 MG tablet  Take 1 tablet (5 mg total) by mouth daily as needed for anxiety. 30 tablet 5  . furosemide (LASIX) 40 MG tablet Take 1 tablet (40 mg total) by mouth daily. 90 tablet 0  . isosorbide mononitrate (IMDUR) 60 MG 24 hr tablet Take 0.5 tablets (30 mg total) by mouth every morning. 30 tablet 11  . KRILL OIL PO Take 1 tablet by mouth daily.    Marland Kitchen latanoprost (XALATAN) 0.005 % ophthalmic solution Place 1 drop into both eyes at bedtime.     Marland Kitchen lisinopril (PRINIVIL,ZESTRIL) 20 MG tablet Take 1 tablet (20 mg total) by mouth daily. 30 tablet 11  . Magnesium Oxide (MAG-OXIDE PO) Take 1 tablet by mouth daily.    . metoprolol (LOPRESSOR) 100 MG tablet TAKE 1/2 TABLET (50 MG) BY MOUTH EVERY MORNING. 45 tablet 3  . metoprolol (LOPRESSOR) 50 MG tablet TAKE 1/2 TABLET (25  MG) BY MOUTH EVERY EVENING. 45 tablet PRN  . Multiple Vitamin (MULTIVITAMIN) tablet Take 1 tablet by mouth daily.      Marland Kitchen neomycin-polymyxin b-dexamethasone (MAXITROL) 3.5-10000-0.1 OINT Place 1 application into both eyes daily.    . niacin (SLO-NIACIN) 500 MG tablet Take 500 mg by mouth at bedtime.      . nitroGLYCERIN (NITROSTAT) 0.4 MG SL tablet Place 0.4 mg under the tongue every 5 (five) minutes as needed for chest pain (for chest pain (MAX of 3 doses)).    Marland Kitchen pantoprazole (PROTONIX) 40 MG tablet Take 1 tablet (40 mg total) by mouth daily. 30 tablet 3  . polyethylene glycol (MIRALAX / GLYCOLAX) packet Take 17 g by mouth daily. 14 each 0  . potassium chloride (K-DUR,KLOR-CON) 10 MEQ tablet Take 10 mEq by mouth 2 (two) times daily.      . rosuvastatin (CRESTOR) 20 MG tablet Take 20 mg by mouth daily.    . Thiamine HCl (VITAMIN B-1) 100 MG tablet Take 100 mg by mouth daily.       No current facility-administered medications for this visit.    Allergies:   Cyclobenzaprine hcl and Tetracycline    Social History:  The patient  reports that he quit smoking about 61 years ago. He does not have any smokeless tobacco history on file. He reports that he drinks about 3.0 - 3.6 oz of alcohol per week. He reports that he does not use illicit drugs.   Family History:  The patient's family history includes Coronary artery disease in his other; Heart attack (age of onset: 89) in his father.    ROS:  Please see the history of present illness.   Otherwise, review of systems are positive for none.   All other systems are reviewed and negative.    PHYSICAL EXAM: VS:  BP 114/46 mmHg  Pulse 80  Ht 5\' 6"  (1.676 m)  Wt 157 lb 12.8 oz (71.578 kg)  BMI 25.48 kg/m2 , BMI Body mass index is 25.48 kg/(m^2). GEN: Well nourished, well developed, in no acute distress HEENT: normal Neck: no JVD, carotid bruits, or masses Cardiac: RRR; there is a grade 2/6 systolic murmur at the base with a soft murmur of aortic  insufficiency Respiratory:  clear to auscultation bilaterally, normal work of breathing GI: soft, nontender, nondistended, + BS MS: no deformity or atrophy Skin: warm and dry, no rash Neuro:  Strength and sensation are intact Psych: euthymic mood, full affect   EKG:  EKG is not ordered today.    Recent Labs: 03/24/2014: Magnesium 2.0 08/06/2014: ALT 15; BUN 24*; Creatinine  0.91; Hemoglobin 14.0; Platelets 149.0*; Potassium 4.1; Sodium 139    Lipid Panel    Component Value Date/Time   CHOL 171 08/06/2014 0735   TRIG 103.0 08/06/2014 0735   HDL 47.90 08/06/2014 0735   CHOLHDL 4 08/06/2014 0735   VLDL 20.6 08/06/2014 0735   LDLCALC 103* 08/06/2014 0735      Wt Readings from Last 3 Encounters:  08/10/14 157 lb 12.8 oz (71.578 kg)  04/21/14 157 lb 12.8 oz (71.578 kg)  04/07/14 154 lb (69.854 kg)         ASSESSMENT AND PLAN:  1. Ischemic heart disease status post CABG in 1982, status post redo CABG in 1994. Status post multiple PCI procedures. Recent increase in exertional throat pain consistent with angina pectoris. 2. history of out of hospital cardiac arrest while playing tennis on 08/28/09 with successful resuscitation. He has a Medtronic ICD. 3. Moderate aortic stenosis with peak gradient of 61 and mean gradient 35 by echo 01/12/13.   4.Successful TAVR 03/23/14  5. erectile dysfunction, not a candidate for Cialis because he is on daily nitrates. 6. Hypercholesterolemia on rosuvastatin 7. strongly positive treadmill Myoview stress test on 01/28/14 showing a large reversible anterior wall defect and showing an ejection fraction of 32%. Cardiac catheterization on 02/01/14 by Dr. Daneen Schick shows patent bypass grafts very similar to the last heart catheterization in 2012. The left internal mammary graft to the LAD is widely patent. The LAD itself is severely and diffusely diseased both antegrade and retrograde from the graft insertion site and this is felt likely the  source of the patient's exertional angina. No focal sites for intervention exist.   Current medicines are reviewed at length with the patient today.  The patient does not have concerns regarding medicines.  The following changes have been made:  no change  Labs/ tests ordered today include:    Orders Placed This Encounter  Procedures  . Lipid panel  . Hepatic function panel  . Basic metabolic panel  . TSH    Discussion: The patient is doing well from a cardiac standpoint.  He is planning a river cruise in [place] again this summer.  They also hope to go the following year to [place] for another river cruise. He will continue current medication.  Recheck in 4 months for follow-up office visit lipid panel hepatic function panel basal metabolic panel and TSH  Signed, Darlin Coco MD 08/10/2014 7:10 PM    [facility] [place], [place], [place]  [address] Phone: [phone]; Fax: [phone]

## 2014-08-10 NOTE — Patient Instructions (Signed)
Medication Instructions:  Your physician recommends that you continue on your current medications as directed. Please refer to the Current Medication list given to you today.  Labwork: none  Testing/Procedures: none  Follow-Up: Your physician recommends that you schedule a follow-up appointment in: 4 months with fasting labs (lp/bmet/hfp/tsh)

## 2014-08-23 ENCOUNTER — Other Ambulatory Visit: Payer: Self-pay | Admitting: Cardiology

## 2014-08-23 DIAGNOSIS — H02831 Dermatochalasis of right upper eyelid: Secondary | ICD-10-CM | POA: Diagnosis not present

## 2014-08-25 ENCOUNTER — Ambulatory Visit (INDEPENDENT_AMBULATORY_CARE_PROVIDER_SITE_OTHER): Payer: Medicare Other | Admitting: Family Medicine

## 2014-08-25 VITALS — BP 122/60 | HR 76 | Temp 98.3°F | Resp 16 | Ht 66.0 in | Wt 157.0 lb

## 2014-08-25 DIAGNOSIS — R3915 Urgency of urination: Secondary | ICD-10-CM | POA: Diagnosis not present

## 2014-08-25 DIAGNOSIS — I255 Ischemic cardiomyopathy: Secondary | ICD-10-CM | POA: Diagnosis not present

## 2014-08-25 DIAGNOSIS — Z789 Other specified health status: Secondary | ICD-10-CM

## 2014-08-25 DIAGNOSIS — N410 Acute prostatitis: Secondary | ICD-10-CM

## 2014-08-25 DIAGNOSIS — R5382 Chronic fatigue, unspecified: Secondary | ICD-10-CM | POA: Diagnosis not present

## 2014-08-25 DIAGNOSIS — R6889 Other general symptoms and signs: Secondary | ICD-10-CM

## 2014-08-25 LAB — POCT CBC
GRANULOCYTE PERCENT: 79.3 % (ref 37–80)
HCT, POC: 42.5 % — AB (ref 43.5–53.7)
Hemoglobin: 13.5 g/dL — AB (ref 14.1–18.1)
Lymph, poc: 1.7 (ref 0.6–3.4)
MCH, POC: 30.4 pg (ref 27–31.2)
MCHC: 31.7 g/dL — AB (ref 31.8–35.4)
MCV: 95.8 fL (ref 80–97)
MID (CBC): 0.6 (ref 0–0.9)
MPV: 8.3 fL (ref 0–99.8)
PLATELET COUNT, POC: 134 10*3/uL — AB (ref 142–424)
POC Granulocyte: 8.6 — AB (ref 2–6.9)
POC LYMPH %: 15.6 % (ref 10–50)
POC MID %: 5.1 % (ref 0–12)
RBC: 4.44 M/uL — AB (ref 4.69–6.13)
RDW, POC: 16.4 %
WBC: 10.8 10*3/uL — AB (ref 4.6–10.2)

## 2014-08-25 LAB — POCT UA - MICROSCOPIC ONLY
CASTS, UR, LPF, POC: NEGATIVE
Crystals, Ur, HPF, POC: NEGATIVE
Mucus, UA: NEGATIVE
Yeast, UA: NEGATIVE

## 2014-08-25 LAB — POCT URINALYSIS DIPSTICK
Bilirubin, UA: NEGATIVE
Blood, UA: NEGATIVE
Glucose, UA: NEGATIVE
Ketones, UA: NEGATIVE
NITRITE UA: NEGATIVE
PROTEIN UA: NEGATIVE
Spec Grav, UA: 1.02
UROBILINOGEN UA: 1
pH, UA: 6

## 2014-08-25 MED ORDER — CIPROFLOXACIN HCL 500 MG PO TABS: 500.0000 mg | ORAL_TABLET | Freq: Two times a day (BID) | ORAL | Status: AC

## 2014-08-25 NOTE — Patient Instructions (Signed)
Seen by Dr. Ollen Barges  Diagnosis Prostatitis   Treatment: cipro 500mg  twice daily for 14 days  Drink plenty of water  Prostatitis The prostate gland is about the size and shape of a walnut. It is located just below your bladder. It produces one of the components of semen, which is made up of sperm and the fluids that help nourish and transport it out from the testicles. Prostatitis is inflammation of the prostate gland.  There are four types of prostatitis:  Acute bacterial prostatitis. This is the least common type of prostatitis. It starts quickly and usually is associated with a bladder infection, high fever, and shaking chills. It can occur at any age.  Chronic bacterial prostatitis. This is a persistent bacterial infection in the prostate. It usually develops from repeated acute bacterial prostatitis or acute bacterial prostatitis that was not properly treated. It can occur in men of any age but is most common in middle-aged men whose prostate has begun to enlarge. The symptoms are not as severe as those in acute bacterial prostatitis. Discomfort in the part of your body that is in front of your rectum and below your scrotum (perineum), lower abdomen, or in the head of your penis (glans) may represent your primary discomfort.  Chronic prostatitis (nonbacterial). This is the most common type of prostatitis. It is inflammation of the prostate gland that is not caused by a bacterial infection. The cause is unknown and may be associated with a viral infection or autoimmune disorder.  Prostatodynia (pelvic floor disorder). This is associated with increased muscular tone in the pelvis surrounding the prostate. CAUSES The causes of bacterial prostatitis are bacterial infection. The causes of the other types of prostatitis are unknown.  SYMPTOMS  Symptoms can vary depending upon the type of prostatitis that exists. There can also be overlap in symptoms. Possible symptoms for each type of  prostatitis are listed below. Acute Bacterial Prostatitis  Painful urination.  Fever or chills.  Muscle or joint pains.  Low back pain.  Low abdominal pain.  Inability to empty bladder completely. Chronic Bacterial Prostatitis, Chronic Nonbacterial Prostatitis, and Prostatodynia  Sudden urge to urinate.  Frequent urination.  Difficulty starting urine stream.  Weak urine stream.  Discharge from the urethra.  Dribbling after urination.  Rectal pain.  Pain in the testicles, penis, or tip of the penis.  Pain in the perineum.  Problems with sexual function.  Painful ejaculation.  Bloody semen. DIAGNOSIS  In order to diagnose prostatitis, your health care provider will ask about your symptoms. One or more urine samples will be taken and tested (urinalysis). If the urinalysis result is negative for bacteria, your health care provider may use a finger to feel your prostate (digital rectal exam). This exam helps your health care provider determine if your prostate is swollen and tender. It will also produce a specimen of semen that can be analyzed. TREATMENT  Treatment for prostatitis depends on the cause. If a bacterial infection is the cause, it can be treated with antibiotic medicine. In cases of chronic bacterial prostatitis, the use of antibiotics for up to 1 month or 6 weeks may be necessary. Your health care provider may instruct you to take sitz baths to help relieve pain. A sitz bath is a bath of hot water in which your hips and buttocks are under water. This relaxes the pelvic floor muscles and often helps to relieve the pressure on your prostate. HOME CARE INSTRUCTIONS   Take all medicines as directed by  your health care provider.  Take sitz baths as directed by your health care provider. SEEK MEDICAL CARE IF:   Your symptoms get worse, not better.  You have a fever. SEEK IMMEDIATE MEDICAL CARE IF:   You have chills.  You feel nauseous or vomit.  You feel  lightheaded or faint.  You are unable to urinate.  You have blood or blood clots in your urine. MAKE SURE YOU:  Understand these instructions.  Will watch your condition.  Will get help right away if you are not doing well or get worse. Document Released: 03/23/2000 Document Revised: 03/31/2013 Document Reviewed: 10/13/2012 [facility] Patient Information 2015 [place], [place]. This information is not intended to replace advice given to you by your health care provider. Make sure you discuss any questions you have with your health care provider.

## 2014-08-25 NOTE — Progress Notes (Signed)
  [name] - 81 y.o. male MRN [id number]  Date of birth: 04/17/1933  SUBJECTIVE:  Including CC & ROS.  Pleasant 81 year old male past medical history significant for multiple cardiac pathology including CAD status post CABG, ICD, aortic valve stenosis s/p TAVR, hypertension, hyperlipidemia, GERD.   Patient presents with new onset 2 weeks up urinary urgency, urinary incontinence, denies dysuria, denies hematuria. He also complains of associated symptoms of fatigue, body aches, chills, and mild fever of 100.1. These flulike malaise symptoms have been going on for 3 days. Denies any previous history of the symptoms in the past. Denies any pelvic pain or discomfort. Denies any upper respiratory symptoms, shortness of breath, chest pain, decreased balance, upper or lower extremity weakness, no dizziness, no night sweats. Reports a normal appetite steady weight over the past 3 months.   ROS:  Constitutional:  Low grade fever, yes chills, significant fatigue.  Respiratory:  No shortness of breath, cough, or wheezing Cardiovascular:  No palpitations, chest pain or syncope Gastrointestinal:  No nausea, no abdominal pain Urinary symptoms discussed above Review of systems otherwise negative except for what is stated in HPI  HISTORY: Past Medical, Surgical, Social, and Family History Reviewed & Updated per EMR. Pertinent Historical Findings include: multiple cardiac pathology including CAD status post CABG, ICD, aortic valve stenosis s/p TAVR, atrial fibrillation, ischemic cardiomyopathy, history of previous cardiac arrest-aborted, hypertension, hyperlipidemia, GERD.   PHYSICAL EXAM:  VS: BP:122/60 mmHg  HR:76bpm  TEMP:98.3 F (36.8 C)( )  RESP:98 %  HT:5\' 6"  (167.6 cm)   WT:157 lb (71.215 kg)  BMI:25.4 PHYSICAL EXAM: General:  Alert and oriented, No acute distress.   HENT:  Normocephalic, Oral mucosa is moist.   Respiratory:  Lungs are clear to auscultation, Respirations are non-labored,  Symmetrical chest wall expansion.   Cardiovascular:  Normal rate, Regular rhythm, No murmur, Good pulses equal in all extremities, No edema.   Gastrointestinal:  Soft, Non-tender, Non-distended, Normal bowel sounds, No organomegaly.   Integumentary:  Warm, Dry, No rash.   Neurologic:  Alert, Oriented, No focal defects Psychiatric:  Cooperative, Appropriate mood & affect.    ASSESSMENT & PLAN:  Impression: Lower urinary tract infection with associated prostatitis  Recommendations: -Discuss with patient that urine analysis showed trace leukoesterase along with microscopic exam that revealed bacteria and moderate WBCs. Patient also has a low-grade leukocytosis of 10,000. Given this finding suspect that acute prostatitis is source of patient's urinary symptoms and malaise. -We'll start him on treatment for prostatitis with ciprofloxacin 500 mg twice a day for 14 days. -Patient follow-up in the office within 14 days or with urology. Patient reports he has appointment with urology but it is over a month from now. If he is able to get in sooner he'll follow-up with them in the next 2 weeks. -Lab work including BMP to monitor kidney function and appropriate GFR for medication as well as a PSA are pending. Urine culture was also obtained and pending. -Will follow-up with patient in 2 weeks and call him with lab results.

## 2014-08-26 LAB — BASIC METABOLIC PANEL
BUN: 23 mg/dL (ref 6–23)
CO2: 28 mEq/L (ref 19–32)
CREATININE: 0.86 mg/dL (ref 0.50–1.35)
Calcium: 10 mg/dL (ref 8.4–10.5)
Chloride: 105 mEq/L (ref 96–112)
Glucose, Bld: 98 mg/dL (ref 70–99)
POTASSIUM: 4.4 meq/L (ref 3.5–5.3)
Sodium: 141 mEq/L (ref 135–145)

## 2014-08-26 LAB — TSH: TSH: 1.45 u[IU]/mL (ref 0.350–4.500)

## 2014-08-27 LAB — PSA: PSA: 26.66 ng/mL — ABNORMAL HIGH (ref <=4.00)

## 2014-08-27 LAB — URINE CULTURE
COLONY COUNT: NO GROWTH
Organism ID, Bacteria: NO GROWTH

## 2014-09-08 ENCOUNTER — Ambulatory Visit (INDEPENDENT_AMBULATORY_CARE_PROVIDER_SITE_OTHER): Payer: Medicare Other | Admitting: Family Medicine

## 2014-09-08 VITALS — BP 136/62 | HR 72 | Temp 98.5°F | Resp 20 | Ht 66.0 in | Wt 155.5 lb

## 2014-09-08 DIAGNOSIS — N410 Acute prostatitis: Secondary | ICD-10-CM

## 2014-09-08 DIAGNOSIS — I255 Ischemic cardiomyopathy: Secondary | ICD-10-CM | POA: Diagnosis not present

## 2014-09-08 DIAGNOSIS — N138 Other obstructive and reflux uropathy: Secondary | ICD-10-CM

## 2014-09-08 DIAGNOSIS — N401 Enlarged prostate with lower urinary tract symptoms: Secondary | ICD-10-CM | POA: Diagnosis not present

## 2014-09-08 LAB — POCT URINALYSIS DIPSTICK
Bilirubin, UA: NEGATIVE
Glucose, UA: NEGATIVE
Leukocytes, UA: NEGATIVE
Nitrite, UA: NEGATIVE
Protein, UA: NEGATIVE
RBC UA: NEGATIVE
Spec Grav, UA: 1.02
Urobilinogen, UA: 0.2
pH, UA: 5.5

## 2014-09-08 LAB — POCT UA - MICROSCOPIC ONLY
CRYSTALS, UR, HPF, POC: NEGATIVE
Casts, Ur, LPF, POC: NEGATIVE
YEAST UA: NEGATIVE

## 2014-09-08 NOTE — Progress Notes (Addendum)
Patient discussed with Dr. Ollen Barges. Agree with assessment and plan of care per her note - resolution of symptoms of acute prostatitis. Repeat PSA pending, with urology follow up planned. If any recurrence of sx's off antibiotic - may need to restart Cipro for longer course.

## 2014-09-08 NOTE — Progress Notes (Signed)
  [name] - 81 y.o. male MRN [id number]  Date of birth: Sep 18, 1933  SUBJECTIVE:  Including CC & ROS.  Pleasant 81 year old male past medical history significant for multiple cardiac pathology including CAD status post CABG, ICD, aortic valve stenosis s/p TAVR, hypertension, hyperlipidemia, GERD.   Patient is presenting for 2 week follow-up after diagnosis of acute prostatitis after he presented with 10-14 days of urinary incontinence., Urinary, and flulike symptoms. Patient's leukocytosis. Lab work revealed bacteria on urinalysis. Urine culture no organisms. His PSA was elevated. Patient presents today for follow-up of reports he has complete resolution of his symptoms. Feels back to baseline. Denies any pelvic discomfort. Urinary function has returned to normal. Patient reports he has appointment with urology in 2 weeks.   ROS:  Constitutional: No fever, body aches, chills, or fatigue Respiratory:  No shortness of breath, cough, or wheezing Cardiovascular:  No palpitations, chest pain or syncope Gastrointestinal:  No nausea, no abdominal pain Urinary symptoms discussed above Review of systems otherwise negative except for what is stated in HPI  HISTORY: Past Medical, Surgical, Social, and Family History Reviewed & Updated per EMR. Pertinent Historical Findings include: multiple cardiac pathology including CAD status post CABG, ICD, aortic valve stenosis s/p TAVR, atrial fibrillation, ischemic cardiomyopathy, history of previous cardiac arrest-aborted, hypertension, hyperlipidemia, GERD.   PHYSICAL EXAM:  VS: BP:136/62 mmHg  HR:72bpm  TEMP:98.5 F (36.9 C)(Oral)  RESP:98 %  HT:5\' 6"  (167.6 cm)   WT:155 lb 8 oz (70.534 kg)  BMI:25.2 PHYSICAL EXAM: General:  Alert and oriented, No acute distress.   HENT:  Normocephalic, Oral mucosa is moist.   Respiratory:  Lungs are clear to auscultation, Respirations are non-labored, Symmetrical chest wall expansion.   Cardiovascular:  Normal  rate, Regular rhythm, No murmur, Good pulses equal in all extremities, No edema.   Gastrointestinal:  Soft, Non-tender, Non-distended, Normal bowel sounds, No organomegaly.   Integumentary:  Warm, Dry, No rash.   Neurologic:  Alert, Oriented, No focal defects Psychiatric:  Cooperative, Appropriate mood & affect.    ASSESSMENT & PLAN:  Impression: Resolution of acute prostatitis  Recommendations: -Discuss with patient that repeat urinalysis today reveals resolution of his leukocytosis. Previous urine culture was negative. -The patient completed a 14 day course of ciprofloxacin 5 previously. Advised patient if symptoms return he might want to repeat sports for another week to 2 weeks. -We'll also repeat PSA today to make sure this is trending down. -Advised patient we'll not check prostate today given that the left have completely resolution of his symptoms before this is done. This can likely be completed urology

## 2014-09-10 ENCOUNTER — Encounter: Payer: Self-pay | Admitting: Sports Medicine

## 2014-09-10 ENCOUNTER — Encounter: Payer: Self-pay | Admitting: Family Medicine

## 2014-09-10 LAB — PSA: PSA: 7.96 ng/mL — ABNORMAL HIGH (ref <=4.00)

## 2014-09-13 ENCOUNTER — Other Ambulatory Visit: Payer: Self-pay | Admitting: Physician Assistant

## 2014-09-20 DIAGNOSIS — N410 Acute prostatitis: Secondary | ICD-10-CM | POA: Diagnosis not present

## 2014-09-20 DIAGNOSIS — R972 Elevated prostate specific antigen [PSA]: Secondary | ICD-10-CM | POA: Diagnosis not present

## 2014-09-20 DIAGNOSIS — N3941 Urge incontinence: Secondary | ICD-10-CM | POA: Diagnosis not present

## 2014-09-20 DIAGNOSIS — N5201 Erectile dysfunction due to arterial insufficiency: Secondary | ICD-10-CM | POA: Diagnosis not present

## 2014-10-04 ENCOUNTER — Other Ambulatory Visit: Payer: Self-pay

## 2014-10-12 DIAGNOSIS — R972 Elevated prostate specific antigen [PSA]: Secondary | ICD-10-CM | POA: Diagnosis not present

## 2014-11-01 DIAGNOSIS — N410 Acute prostatitis: Secondary | ICD-10-CM | POA: Diagnosis not present

## 2014-11-01 DIAGNOSIS — R972 Elevated prostate specific antigen [PSA]: Secondary | ICD-10-CM | POA: Diagnosis not present

## 2014-11-01 DIAGNOSIS — Z87438 Personal history of other diseases of male genital organs: Secondary | ICD-10-CM | POA: Diagnosis not present

## 2014-11-01 DIAGNOSIS — N5201 Erectile dysfunction due to arterial insufficiency: Secondary | ICD-10-CM | POA: Diagnosis not present

## 2014-11-01 DIAGNOSIS — N3941 Urge incontinence: Secondary | ICD-10-CM | POA: Diagnosis not present

## 2014-11-05 ENCOUNTER — Other Ambulatory Visit: Payer: Self-pay | Admitting: *Deleted

## 2014-11-05 DIAGNOSIS — I350 Nonrheumatic aortic (valve) stenosis: Secondary | ICD-10-CM

## 2014-11-09 DIAGNOSIS — Z9849 Cataract extraction status, unspecified eye: Secondary | ICD-10-CM | POA: Diagnosis not present

## 2014-11-09 DIAGNOSIS — H5212 Myopia, left eye: Secondary | ICD-10-CM | POA: Diagnosis not present

## 2014-11-09 DIAGNOSIS — H5201 Hypermetropia, right eye: Secondary | ICD-10-CM | POA: Diagnosis not present

## 2014-11-09 DIAGNOSIS — Z961 Presence of intraocular lens: Secondary | ICD-10-CM | POA: Diagnosis not present

## 2014-11-19 ENCOUNTER — Other Ambulatory Visit: Payer: Self-pay | Admitting: *Deleted

## 2014-11-19 DIAGNOSIS — F419 Anxiety disorder, unspecified: Secondary | ICD-10-CM

## 2014-11-19 MED ORDER — DIAZEPAM 5 MG PO TABS: 5.0000 mg | ORAL_TABLET | Freq: Every day | ORAL | Status: DC | PRN

## 2014-11-22 ENCOUNTER — Other Ambulatory Visit: Payer: Self-pay | Admitting: Cardiology

## 2014-12-06 ENCOUNTER — Other Ambulatory Visit: Payer: Self-pay | Admitting: Cardiology

## 2014-12-06 DIAGNOSIS — L814 Other melanin hyperpigmentation: Secondary | ICD-10-CM | POA: Diagnosis not present

## 2014-12-06 DIAGNOSIS — L821 Other seborrheic keratosis: Secondary | ICD-10-CM | POA: Diagnosis not present

## 2014-12-06 DIAGNOSIS — L570 Actinic keratosis: Secondary | ICD-10-CM | POA: Diagnosis not present

## 2014-12-06 DIAGNOSIS — D692 Other nonthrombocytopenic purpura: Secondary | ICD-10-CM | POA: Diagnosis not present

## 2014-12-06 DIAGNOSIS — C44319 Basal cell carcinoma of skin of other parts of face: Secondary | ICD-10-CM | POA: Diagnosis not present

## 2014-12-14 ENCOUNTER — Other Ambulatory Visit: Payer: Self-pay | Admitting: Cardiology

## 2014-12-14 ENCOUNTER — Other Ambulatory Visit (INDEPENDENT_AMBULATORY_CARE_PROVIDER_SITE_OTHER): Payer: Medicare Other | Admitting: *Deleted

## 2014-12-14 ENCOUNTER — Other Ambulatory Visit: Payer: Self-pay | Admitting: *Deleted

## 2014-12-14 DIAGNOSIS — I119 Hypertensive heart disease without heart failure: Secondary | ICD-10-CM | POA: Diagnosis not present

## 2014-12-14 DIAGNOSIS — I4891 Unspecified atrial fibrillation: Secondary | ICD-10-CM | POA: Diagnosis not present

## 2014-12-14 DIAGNOSIS — I469 Cardiac arrest, cause unspecified: Secondary | ICD-10-CM

## 2014-12-14 DIAGNOSIS — E7800 Pure hypercholesterolemia, unspecified: Secondary | ICD-10-CM

## 2014-12-14 DIAGNOSIS — I255 Ischemic cardiomyopathy: Secondary | ICD-10-CM

## 2014-12-14 DIAGNOSIS — Z951 Presence of aortocoronary bypass graft: Secondary | ICD-10-CM

## 2014-12-14 LAB — BASIC METABOLIC PANEL
BUN: 32 mg/dL — AB (ref 6–23)
CALCIUM: 10.6 mg/dL — AB (ref 8.4–10.5)
CHLORIDE: 110 meq/L (ref 96–112)
CO2: 24 mEq/L (ref 19–32)
CREATININE: 1.05 mg/dL (ref 0.40–1.50)
GFR: 72.45 mL/min (ref >60.00)
Glucose, Bld: 94 mg/dL (ref 70–99)
Potassium: 4.9 mEq/L (ref 3.5–5.1)
Sodium: 143 mEq/L (ref 135–145)

## 2014-12-14 LAB — HEPATIC FUNCTION PANEL
ALK PHOS: 48 U/L (ref 39–117)
ALT: 13 U/L (ref 0–53)
AST: 21 U/L (ref 0–37)
Albumin: 4.6 g/dL (ref 3.5–5.2)
BILIRUBIN DIRECT: 0.2 mg/dL (ref 0.0–0.3)
BILIRUBIN TOTAL: 0.7 mg/dL (ref 0.2–1.2)
TOTAL PROTEIN: 7.2 g/dL (ref 6.0–8.3)

## 2014-12-14 LAB — LIPID PANEL
CHOLESTEROL: 151 mg/dL (ref 0–200)
HDL: 45.6 mg/dL (ref >39.00)
LDL CALC: 87 mg/dL (ref 0–99)
NonHDL: 105.63
TRIGLYCERIDES: 95 mg/dL (ref 0.0–149.0)
Total CHOL/HDL Ratio: 3
VLDL: 19 mg/dL (ref 0.0–40.0)

## 2014-12-14 LAB — TSH: TSH: 1.64 u[IU]/mL (ref 0.35–4.50)

## 2014-12-14 NOTE — Addendum Note (Signed)
Addended by: Eulis Foster on: 12/14/2014 08:54 AM   Modules accepted: Orders

## 2014-12-14 NOTE — Progress Notes (Signed)
Quick Note:  Please make copy of labs for patient visit. ______ 

## 2014-12-17 ENCOUNTER — Ambulatory Visit (INDEPENDENT_AMBULATORY_CARE_PROVIDER_SITE_OTHER): Payer: Medicare Other | Admitting: Cardiology

## 2014-12-17 ENCOUNTER — Encounter: Payer: Self-pay | Admitting: Cardiology

## 2014-12-17 VITALS — BP 126/64 | HR 66 | Ht 66.0 in | Wt 160.8 lb

## 2014-12-17 DIAGNOSIS — E7800 Pure hypercholesterolemia, unspecified: Secondary | ICD-10-CM

## 2014-12-17 DIAGNOSIS — I255 Ischemic cardiomyopathy: Secondary | ICD-10-CM

## 2014-12-17 DIAGNOSIS — Z951 Presence of aortocoronary bypass graft: Secondary | ICD-10-CM

## 2014-12-17 DIAGNOSIS — N521 Erectile dysfunction due to diseases classified elsewhere: Secondary | ICD-10-CM | POA: Diagnosis not present

## 2014-12-17 MED ORDER — ISOSORBIDE MONONITRATE ER 30 MG PO TB24: ORAL_TABLET | ORAL | Status: DC

## 2014-12-17 NOTE — Progress Notes (Signed)
Cardiology Office Note   Date:  12/17/2014   ID:  [name], DOB [birth year], MRN [id number]  PCP:  Carlos Danes, MD  Cardiologist: Carlos Coco MD  No chief complaint on file.     History of Present Illness: [name] is a 81 y.o. male who presents for scheduled four-month follow-up visit. 81 yo male with history of CAD s/p CABG (1982 with redo 1994), ischemic cardiomyopathy, out-of-hospital cardiac arrest in 2011 with successful resuscitation and subsequent placement of Medtronic ICD, and progressive aortic stenosis who is now s/p TAVR on 03/23/14.   The patient has been feeling well. He has been playing golf. He can play 18 holes but of course does take a cart. He will occasionally get mild angina in his throat if he is walking uphill. He does not have to take sublingual nitroglycerin. He merely stops and rests.  He is on Imdur.  His chief complaints today are urologic.  He has urinary frequency.  When he plays golf he has to wear depends because of leakage.  He also has erectile dysfunction.  He would like to try Cialis or Viagra.  He is exercising regularly.  He walks on his home treadmill for 20 minutes and he also does a rowing machine and light weights.  This summer they went to [place] and more on a river cruise which they enjoyed.  However his weight is up 5 pounds since last visit.  His lipids are still satisfactory however.   Past Medical History  Diagnosis Date  . Cardiac arrest     aborted, atrial fibrillation/flutter  . Presence of automatic cardioverter/defibrillator (AICD)     Medtronic, remote-yes.   . Coronary artery disease   . Aortic stenosis, severe     s/p TAVR with Edwards Sapien 3 THV (size 26 mm, model # 9600TFX, serial # [id number])  . Peripheral vascular disease   . Essential hypertension   . Hyperlipidemia   . Angina pectoris      Recurrent angina pectoris  . Claudication   . Myocardial infarction   . Heart murmur   . Kidney  stones   . History of hiatal hernia   . Headache   . Arthritis     Past Surgical History  Procedure Laterality Date  . Cabg      in 1982 and '84 with subsequent PTCI  . Appendectomy    . Tonsillectomy    . Adenoidectomy    . Cataract surgery       Bilateral cataract surgery  . Left elbow tendon surgery    . Cardiac catheterization   11/16/2009   . Left and right heart catheterization with coronary/graft angiogram N/A 02/01/2014    Procedure: LEFT AND RIGHT HEART CATHETERIZATION WITH Beatrix Fetters;  Surgeon: Sinclair Grooms, MD;  Location: [facility] CATH LAB;  Service: Cardiovascular;  Laterality: N/A;  . Tonsillectomy    . Hernia repair    . Cardiac defibrillator placement    . Transcatheter aortic valve replacement, transfemoral N/A 03/23/2014    Procedure: TRANSCATHETER AORTIC VALVE REPLACEMENT, TRANSFEMORAL;  Surgeon: Burnell Blanks, MD;  Location: [place];  Service: Open Heart Surgery;  Laterality: N/A;  . Tee without cardioversion N/A 03/23/2014    Procedure: TRANSESOPHAGEAL ECHOCARDIOGRAM (TEE);  Surgeon: Burnell Blanks, MD;  Location: [place];  Service: Open Heart Surgery;  Laterality: N/A;     Current Outpatient Prescriptions  Medication Sig Dispense Refill  . amLODipine (NORVASC) 5 MG tablet Take  1 tablet by mouth daily.    Marland Kitchen aspirin 81 MG tablet Take 81 mg by mouth daily.      . clopidogrel (PLAVIX) 75 MG tablet TAKE 1 TABLET BY MOUTH ONCE DAILY. 90 tablet 1  . Coenzyme Q10 (CO Q 10 PO) Take 1 tablet by mouth daily.     . CRESTOR 20 MG tablet TAKE 1 TABLET BY MOUTH DAILY. 90 tablet 1  . diazepam (VALIUM) 5 MG tablet Take 1 tablet (5 mg total) by mouth daily as needed for anxiety. 30 tablet 5  . furosemide (LASIX) 40 MG tablet TAKE 1 TABLET BY MOUTH DAILY. 90 tablet 2  . isosorbide mononitrate (IMDUR) 30 MG 24 hr tablet TAKE 1/2 TABLET BY MOUTH DAILY 15 tablet 5  . KRILL OIL PO Take 1 tablet by mouth daily.    Marland Kitchen latanoprost (XALATAN) 0.005 %  ophthalmic solution Place 1 drop into both eyes at bedtime.     Marland Kitchen lisinopril (PRINIVIL,ZESTRIL) 20 MG tablet Take 1 tablet (20 mg total) by mouth daily. 90 tablet 1  . Magnesium Oxide (MAG-OXIDE PO) Take 1 tablet by mouth daily.    . metoprolol (LOPRESSOR) 100 MG tablet TAKE 1/2 TABLET (50 MG) BY MOUTH EVERY MORNING. 45 tablet 3  . metoprolol (LOPRESSOR) 50 MG tablet TAKE 1/2 TABLET (25 MG) BY MOUTH EVERY EVENING. 45 tablet PRN  . Multiple Vitamin (MULTIVITAMIN) tablet Take 1 tablet by mouth daily.      Marland Kitchen neomycin-polymyxin b-dexamethasone (MAXITROL) 3.5-10000-0.1 OINT Place 1 application into both eyes daily.    . niacin (SLO-NIACIN) 500 MG tablet Take 500 mg by mouth at bedtime.      . nitroGLYCERIN (NITROSTAT) 0.4 MG SL tablet Place 0.4 mg under the tongue every 5 (five) minutes as needed for chest pain (for chest pain (MAX of 3 doses)).    Marland Kitchen pantoprazole (PROTONIX) 40 MG tablet Take 1 tablet (40 mg total) by mouth daily. 30 tablet 3  . polyethylene glycol (MIRALAX / GLYCOLAX) packet Take 17 g by mouth daily. 14 each 0  . potassium chloride (K-DUR,KLOR-CON) 10 MEQ tablet Take 10 mEq by mouth 2 (two) times daily.      . Thiamine HCl (VITAMIN B-1) 100 MG tablet Take 100 mg by mouth daily.       No current facility-administered medications for this visit.    Allergies:   Cyclobenzaprine hcl and Tetracycline    Social History:  The patient  reports that he quit smoking about 62 years ago. He does not have any smokeless tobacco history on file. He reports that he drinks about 3.0 - 3.6 oz of alcohol per week. He reports that he does not use illicit drugs.   Family History:  The patient's family history includes Coronary artery disease in his other; Heart attack (age of onset: 52) in his father.    ROS:  Please see the history of present illness.   Otherwise, review of systems are positive for none.   All other systems are reviewed and negative.    PHYSICAL EXAM: VS:  BP 126/64 mmHg  Pulse  66  Ht 5\' 6"  (1.676 m)  Wt 160 lb 12.8 oz (72.938 kg)  BMI 25.97 kg/m2 , BMI Body mass index is 25.97 kg/(m^2). GEN: Well nourished, well developed, in no acute distress HEENT: normal Neck: no JVD, carotid bruits, or masses Cardiac: RRR; no murmurs, rubs, or gallops,no edema  Respiratory:  clear to auscultation bilaterally, normal work of breathing GI: soft, nontender, nondistended, +  BS MS: no deformity or atrophy Skin: warm and dry, no rash Neuro:  Strength and sensation are intact Psych: euthymic mood, full affect   EKG:  EKG is not ordered today.    Recent Labs: 03/24/2014: Magnesium 2.0 08/06/2014: Platelets 149.0* 08/25/2014: Hemoglobin 13.5* 12/14/2014: ALT 13; BUN 32*; Creatinine, Ser 1.05; Potassium 4.9; Sodium 143; TSH 1.64    Lipid Panel    Component Value Date/Time   CHOL 151 12/14/2014 0854   TRIG 95.0 12/14/2014 0854   HDL 45.60 12/14/2014 0854   CHOLHDL 3 12/14/2014 0854   VLDL 19.0 12/14/2014 0854   LDLCALC 87 12/14/2014 0854      Wt Readings from Last 3 Encounters:  12/17/14 160 lb 12.8 oz (72.938 kg)  09/08/14 155 lb 8 oz (70.534 kg)  08/25/14 157 lb (71.215 kg)        ASSESSMENT AND PLAN:  1. Ischemic heart disease status post CABG in 1982, status post redo CABG in 1994. Status post multiple PCI procedures. Recent increase in exertional throat pain consistent with angina pectoris. 2. history of out of hospital cardiac arrest while playing tennis on 08/28/09 with successful resuscitation. He has a Medtronic ICD. 3. Moderate aortic stenosis with peak gradient of 61 and mean gradient 35 by echo 01/12/13.  4.Successful TAVR 03/23/14  5. erectile dysfunction, not a candidate for Cialis because he is on daily nitrates. 6. Hypercholesterolemia on rosuvastatin 7. strongly positive treadmill Myoview stress test on 01/28/14 showing a large reversible anterior wall defect and showing an ejection fraction of 32%. Cardiac catheterization on 02/01/14 by  Dr. Daneen Schick shows patent bypass grafts very similar to the last heart catheterization in 2012. The left internal mammary graft to the LAD is widely patent. The LAD itself is severely and diffusely diseased both antegrade and retrograde from the graft insertion site and this is felt likely the source of the patient's exertional angina. No focal sites for intervention exist.   Current medicines are reviewed at length with the patient today. The patient does not have concerns regarding medicines.  The following changes have been made: no change  Labs/ tests ordered today include:   Orders Placed This Encounter  Procedures            Current medicines are reviewed at length with the patient today.  The patient does not have concerns regarding medicines.  The following changes have been made:  We will start to taper his nitrates in order to make room for a subsequent trial of Cialis.  We will reduce his isosorbide mononitrate from 30 mg to just 15 mg a day  Labs/ tests ordered today include:   Orders Placed This Encounter  Procedures  . Lipid panel  . Hepatic function panel  . Basic metabolic panel    Disposition: Continue current medication except for reduced dose of isosorbide mononitrate.  He will call us in about a month to let us know if he has had any worsening angina on the lower dose.  If not we will try to stop it altogether and consider a trial of daily Cialis.  This will help both his urinary frequency and his erectile dysfunction.  Recheck in 4 months for office visit lipid panel hepatic function panel and basal metabolic panel.  Berna Spare MD 12/17/2014 10:30 AM    [address], [place], [place]  [address] Phone: [phone]; Fax: [phone]

## 2014-12-17 NOTE — Patient Instructions (Addendum)
Medication Instructions:  DECREASE YOUR ISOSORBIDE (IMDUR) TO 30 MG 1/2 TABLET DAILY  Labwork: NONE  Testing/Procedures: NONE  Follow-Up: Your physician wants you to follow-up in: 4 months with fasting labs (lp/bmet/hfp)

## 2015-01-11 DIAGNOSIS — C44219 Basal cell carcinoma of skin of left ear and external auricular canal: Secondary | ICD-10-CM | POA: Diagnosis not present

## 2015-01-11 DIAGNOSIS — Z85828 Personal history of other malignant neoplasm of skin: Secondary | ICD-10-CM | POA: Diagnosis not present

## 2015-01-26 DIAGNOSIS — Z23 Encounter for immunization: Secondary | ICD-10-CM | POA: Diagnosis not present

## 2015-01-27 DIAGNOSIS — Z23 Encounter for immunization: Secondary | ICD-10-CM | POA: Diagnosis not present

## 2015-02-25 ENCOUNTER — Ambulatory Visit (INDEPENDENT_AMBULATORY_CARE_PROVIDER_SITE_OTHER): Payer: Medicare Other | Admitting: Internal Medicine

## 2015-02-25 ENCOUNTER — Encounter: Payer: Self-pay | Admitting: Internal Medicine

## 2015-02-25 ENCOUNTER — Telehealth: Payer: Self-pay | Admitting: Internal Medicine

## 2015-02-25 VITALS — BP 134/60 | HR 72 | Ht 66.5 in | Wt 159.2 lb

## 2015-02-25 DIAGNOSIS — I255 Ischemic cardiomyopathy: Secondary | ICD-10-CM

## 2015-02-25 DIAGNOSIS — I259 Chronic ischemic heart disease, unspecified: Secondary | ICD-10-CM

## 2015-02-25 DIAGNOSIS — I4891 Unspecified atrial fibrillation: Secondary | ICD-10-CM

## 2015-02-25 LAB — CUP PACEART INCLINIC DEVICE CHECK
Battery Voltage: 2.78 V
Brady Statistic AP VP Percent: 0.98 %
Brady Statistic AS VP Percent: 10.58 %
Brady Statistic AS VS Percent: 25.87 %
HighPow Impedance: 36 Ohm
HighPow Impedance: 44 Ohm
Implantable Lead Implant Date: 20110601
Implantable Lead Location: 753859
Implantable Lead Model: 5076
Lead Channel Impedance Value: 342 Ohm
Lead Channel Pacing Threshold Pulse Width: 0.4 ms
Lead Channel Sensing Intrinsic Amplitude: 4.5 mV
Lead Channel Setting Pacing Amplitude: 2 V
Lead Channel Setting Pacing Amplitude: 2.75 V
MDC IDC LEAD IMPLANT DT: 20110601
MDC IDC LEAD LOCATION: 753860
MDC IDC MSMT LEADCHNL RA IMPEDANCE VALUE: 475 Ohm
MDC IDC MSMT LEADCHNL RA SENSING INTR AMPL: 2.125 mV
MDC IDC MSMT LEADCHNL RV PACING THRESHOLD AMPLITUDE: 1.25 V
MDC IDC SESS DTM: 20161118152729
MDC IDC SET LEADCHNL RV PACING PULSEWIDTH: 0.4 ms
MDC IDC SET LEADCHNL RV SENSING SENSITIVITY: 0.3 mV
MDC IDC STAT BRADY AP VS PERCENT: 62.57 %
MDC IDC STAT BRADY RA PERCENT PACED: 63.55 %
MDC IDC STAT BRADY RV PERCENT PACED: 11.56 %

## 2015-02-25 MED ORDER — DABIGATRAN ETEXILATE MESYLATE 150 MG PO CAPS: 150.0000 mg | ORAL_CAPSULE | Freq: Two times a day (BID) | ORAL | Status: DC

## 2015-02-25 NOTE — Patient Instructions (Signed)
Medication Instructions: - no changes today  Labwork: - none  Procedures/Testing: - none  Follow-Up: - Remote monitoring is used to monitor your Pacemaker of ICD from home. This monitoring reduces the number of office visits required to check your device to one time per year. It allows [place] to keep an eye on the functioning of your device to ensure it is working properly. You are scheduled for a device check from home on 05/30/15. You may send your transmission at any time that day. If you have a wireless device, the transmission will be sent automatically. After your physician reviews your transmission, you will receive a postcard with your next transmission date.  - Your physician wants you to follow-up in: 1 year with Chanetta Marshall, NP for Dr. Caryl Comes. You will receive a reminder letter in the mail two months in advance. If you don't receive a letter, please call our office to schedule the follow-up appointment.  Any Additional Special Instructions Will Be Listed Below (If Applicable). - Please call the office when you decide which blood thinner you would like to start [phone]- Alvis Lemmings, RN, BSN

## 2015-02-25 NOTE — Telephone Encounter (Signed)
New message     Pt has decided to start prodaxa.  He will be leaving tomorrow going out of town for the holiday.  He said you will give him samples to start this medication.  Please call because he want to pick up samples today.

## 2015-02-25 NOTE — Telephone Encounter (Signed)
OK per Dr. Caryl Comes to start Pradaxa 150 mg one capsule by mouth twice daily. No samples available- RX card will be given to the patient.

## 2015-02-25 NOTE — Telephone Encounter (Signed)
The patient is aware that the RX card is at the front desk for him to pick up with a prescription and take to the pharmacy.

## 2015-02-25 NOTE — Progress Notes (Signed)
[name] Patient Care Team: Darlin Coco, MD as PCP - General (Cardiology)   HPI  [name] is a 81 y.o. male Seen in followup for ICD implantedradiology aborted cardiac arrest in the setting of ischemic heart disease prior bypass grafting and redo bypass grafting most recently in 1994 with subsequent PCI and mild aortic stenosis.He also has a history of Atrial fibrillation with Thrombo- embolic risk factors are notable for age vascular disease and hypertension and LV dysfunction.  His CHADS VASC score is 4.   Echo in  October 2014 demonstrated normal left ventricular function. Aortic valve demonstrated moderate-severe stenosis with a mean gradient of 35 and peak 60   He underwent TAVR.  In the wake of this, anticoagulation was discontinued and he was placed on aspirin/Plavix Last catheterization in May 2011 patent LIMA and patent vein graft to the circumflex and PDA territory. There is mild aortic stenosis and mild left ventricular dysfunction. He has a history of multiple stents.   He has been feeling great since his TAVR. Energy level is much improved.   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    Metabolic profile was normal 8/15.    Past Medical History  Diagnosis Date  . Cardiac arrest [facility])     aborted, atrial fibrillation/flutter  . Presence of automatic cardioverter/defibrillator (AICD)     Medtronic, remote-yes.   . Coronary artery disease   . Aortic stenosis, severe     s/p TAVR with Edwards Sapien 3 THV (size 26 mm, model # 9600TFX, serial # [id number])  . Peripheral vascular disease ([place])   . Essential hypertension   . Hyperlipidemia   . Angina pectoris      Recurrent angina pectoris  . Claudication ([place])   . Myocardial infarction ([place])   . Heart murmur   . Kidney stones   . History of hiatal hernia   . Headache   . Arthritis     Past  Surgical History  Procedure Laterality Date  . Cabg      in 1982 and '84 with subsequent PTCI  . Appendectomy    . Tonsillectomy    . Adenoidectomy    . Cataract surgery       Bilateral cataract surgery  . Left elbow tendon surgery    . Cardiac catheterization   11/16/2009   . Left and right heart catheterization with coronary/graft angiogram N/A 02/01/2014    Procedure: LEFT AND RIGHT HEART CATHETERIZATION WITH Beatrix Fetters;  Surgeon: Sinclair Grooms, MD;  Location: [facility] CATH LAB;  Service: Cardiovascular;  Laterality: N/A;  . Tonsillectomy    . Hernia repair    . Cardiac defibrillator placement    . Transcatheter aortic valve replacement, transfemoral N/A 03/23/2014    Procedure: TRANSCATHETER AORTIC VALVE REPLACEMENT, TRANSFEMORAL;  Surgeon: Burnell Blanks, MD;  Location: [place];  Service: Open Heart Surgery;  Laterality: N/A;  . Tee without cardioversion N/A 03/23/2014    Procedure: TRANSESOPHAGEAL ECHOCARDIOGRAM (TEE);  Surgeon: Burnell Blanks, MD;  Location: [place];  Service: Open Heart Surgery;  Laterality: N/A;    Current Outpatient Prescriptions  Medication Sig Dispense Refill  . amLODipine (NORVASC) 5 MG tablet Take 1 tablet by mouth daily.    Marland Kitchen aspirin 81 MG tablet Take 81 mg by mouth daily.      . clopidogrel (PLAVIX) 75 MG tablet TAKE 1 TABLET BY MOUTH ONCE DAILY. [place]  tablet 1  . Coenzyme Q10 (CO Q 10 PO) Take 1 tablet by mouth daily.     . CRESTOR 20 MG tablet TAKE 1 TABLET BY MOUTH DAILY. 90 tablet 1  . diazepam (VALIUM) 5 MG tablet Take 1 tablet (5 mg total) by mouth daily as needed for anxiety. 30 tablet 5  . furosemide (LASIX) 40 MG tablet TAKE 1 TABLET BY MOUTH DAILY. 90 tablet 2  . isosorbide mononitrate (IMDUR) 30 MG 24 hr tablet TAKE 1/2 TABLET BY MOUTH DAILY 15 tablet 5  . KRILL OIL PO Take 1 tablet by mouth daily.    Marland Kitchen latanoprost (XALATAN) 0.005 % ophthalmic solution Place 1 drop into both eyes at bedtime.     Marland Kitchen lisinopril  (PRINIVIL,ZESTRIL) 20 MG tablet Take 1 tablet (20 mg total) by mouth daily. 90 tablet 1  . Magnesium Oxide (MAG-OXIDE PO) Take 1 tablet by mouth daily.    . metoprolol (LOPRESSOR) 100 MG tablet TAKE 1/2 TABLET (50 MG) BY MOUTH EVERY MORNING. 45 tablet 3  . metoprolol (LOPRESSOR) 50 MG tablet TAKE 1/2 TABLET (25 MG) BY MOUTH EVERY EVENING. 45 tablet PRN  . Multiple Vitamin (MULTIVITAMIN) tablet Take 1 tablet by mouth daily.      Marland Kitchen neomycin-polymyxin b-dexamethasone (MAXITROL) 3.5-10000-0.1 OINT Place 1 application into both eyes daily.    . niacin (SLO-NIACIN) 500 MG tablet Take 500 mg by mouth at bedtime.      . nitroGLYCERIN (NITROSTAT) 0.4 MG SL tablet Place 0.4 mg under the tongue every 5 (five) minutes as needed for chest pain (for chest pain (MAX of 3 doses)).    Marland Kitchen pantoprazole (PROTONIX) 40 MG tablet Take 1 tablet (40 mg total) by mouth daily. 30 tablet 3  . polyethylene glycol (MIRALAX / GLYCOLAX) packet Take 17 g by mouth daily. 14 each 0  . potassium chloride (K-DUR,KLOR-CON) 10 MEQ tablet Take 10 mEq by mouth 2 (two) times daily.      . Thiamine HCl (VITAMIN B-1) 100 MG tablet Take 100 mg by mouth daily.       No current facility-administered medications for this visit.    Allergies  Allergen Reactions  . Cyclobenzaprine Hcl Other (See Comments)    Unspecified reaction pt doesn't know what reaction  . Tetracycline     Rash     Review of Systems negative except from HPI and PMH  Physical Exam BP 134/60 mmHg  Pulse 72  Ht 5' 6.5" (1.689 m)  Wt 159 lb 3.2 oz (72.213 kg)  BMI 25.31 kg/m2 Well developed and well nourished in no acute distress HENT normal E scleral and icterus clear Neck Supple JVP flat; carotids a little delayed Clear to ausculation Regular rate and rhythm,  2/6 systolic m Soft with active bowel sounds No clubbing cyanosis none Edema Alert and oriented, grossly normal motor and sensory function Skin Warm and Dry  ECg demonstrates ventricular pacing  at 72 with underlying atrial fibrillation.  Assessment and  Plan  Sudden cardiac death-aborted -     AUTOMATIC IMPLANTABLE CARDIAC DEFIBRILLATOR SITU   .   S/P CABG (coronary artery bypass graft) -     Ischemic Cardiomyopathy  Aortic stenosis-asymptomatic-severe/moderate   Benign hypertensive heart disease without heart failure -  ATrial fibrillation  The patient has developed recurrent persisting atrial fibrillation. He is not on anticoagulation. I discussed this with Dr. Billee Cashing. We will plan to discontinue his Plavix now greater than 6 months post TAVR and begin him on a NOAC of his  choice. We discussed the controversy of NOACs bioprostheses.  Given the fact that he is started ventricular pacing in the context of atrial fibrillation, it would be my inclination, and not withstanding the paucity of symptoms, to undertake cardioversion to restore sinus rhythm. Unfortunately, given the fact that he has had paroxysms of atrial fibrillation over the last 4 months, but will likely require an adjunctive antiarrhythmic therapy. It would be my inclination probably uses amiodarone. I will ask Dr. Mare Ferrari to weigh in on this and perhaps when he sees Dr. Kendra Opitz seen in January and anticipate cardioversion

## 2015-03-25 ENCOUNTER — Telehealth: Payer: Self-pay | Admitting: Cardiology

## 2015-03-25 ENCOUNTER — Ambulatory Visit (INDEPENDENT_AMBULATORY_CARE_PROVIDER_SITE_OTHER): Payer: Medicare Other | Admitting: Physician Assistant

## 2015-03-25 VITALS — BP 112/60 | HR 84 | Temp 97.8°F | Resp 16 | Ht 65.0 in | Wt 159.0 lb

## 2015-03-25 DIAGNOSIS — J069 Acute upper respiratory infection, unspecified: Secondary | ICD-10-CM

## 2015-03-25 DIAGNOSIS — M609 Myositis, unspecified: Secondary | ICD-10-CM | POA: Diagnosis not present

## 2015-03-25 DIAGNOSIS — M791 Myalgia, unspecified site: Secondary | ICD-10-CM

## 2015-03-25 LAB — POCT CBC
Granulocyte percent: 65.8 %G (ref 37–80)
HCT, POC: 38.6 % — AB (ref 43.5–53.7)
HEMOGLOBIN: 13.2 g/dL — AB (ref 14.1–18.1)
LYMPH, POC: 1.3 (ref 0.6–3.4)
MCH: 31.9 pg — AB (ref 27–31.2)
MCHC: 34.2 g/dL (ref 31.8–35.4)
MCV: 93.2 fL (ref 80–97)
MID (CBC): 0.3 (ref 0–0.9)
MPV: 8.1 fL (ref 0–99.8)
PLATELET COUNT, POC: 99 10*3/uL — AB (ref 142–424)
POC Granulocyte: 3.2 (ref 2–6.9)
POC LYMPH PERCENT: 27.3 %L (ref 10–50)
POC MID %: 6.9 %M (ref 0–12)
RBC: 4.14 M/uL — AB (ref 4.69–6.13)
RDW, POC: 13.6 %
WBC: 4.9 10*3/uL (ref 4.6–10.2)

## 2015-03-25 LAB — POCT INFLUENZA A/B
INFLUENZA B, POC: NEGATIVE
Influenza A, POC: NEGATIVE

## 2015-03-25 NOTE — Telephone Encounter (Signed)
Spoke with patient and he started Wednesday after church with chills, temp 97.9 Patient has head/chest congestion last night had wheezing  Headache yesterday am, aching, and feels weak.  Discussed with  Dr. Mare Ferrari and he recommended going to Urgent Care for evaluation  Advised patient, verbalized understanding

## 2015-03-25 NOTE — Patient Instructions (Signed)
Take mucinex and tylenol. Get plenty of rest. Drink plenty of fluids. Call me if anything changes. If your symptoms are not getting better in 5 days, let me know.

## 2015-03-25 NOTE — Telephone Encounter (Signed)
New Message  Pt called states that he has the flu. Request to have Dr. Mare Ferrari call something in for him.

## 2015-03-25 NOTE — Progress Notes (Signed)
[facility] [address] [place] [address] [phone]  Date:  03/25/2015   Name:  [name]   DOB:  1933/04/14   MRN:  [id number]  PCP:  Warren Danes, MD    Chief Complaint: Chills; Generalized Body Aches; Nasal Congestion; and chest congestion   History of Present Illness:  This is a 81 y.o. male with PMH aortic stenosis s/p TAVR 2015, paroxysmal A fib, ischemic cardiomyopathy, HLD who is presenting with chills, body aches, and nasal congestion x 2 days. Started with chills. He has been more fatigued and slept all day yesterday. He has a mild cough. No SOB. Wife felt like he was wheezing last night while he was sleeping. Denies sore throat, otalgia, fever, n/v/d. Has tried mucinex and tylenol and feels a little better since taking. No hx asthma, allergies, tobacco use. Took his flu shot in October. Called his cardiologist who acts as his PCP who told him to go to UC to make sure this was not the flu or a bacterial illness.  Review of Systems:  Review of Systems See HPI  Patient Active Problem List   Diagnosis Date Noted  . Erectile disorder due to medical condition in male patient 12/17/2014  . Aortic stenosis, severe   . Severe aortic valve stenosis - s/p  Edwards Sapien 3 THV (size 26 mm, model # 9600TFX, serial # [id number]) 03/23/2014 03/23/2014  . Abnormal nuclear stress test 01/29/2014  . Aortic stenosis 07/07/2012  . Ischemic cardiomyopathy 2012/01/07  . Sudden cardiac death-aborted 10/31/10  . Back pain 07/10/2010  . Hypercholesteremia 07/10/2010  . Benign hypertensive heart disease without heart failure 07/10/2010  . S/P CABG (coronary artery bypass graft) 07/10/2010  . ICD-Medtronic 03/30/2010  . Atrial fibrillation ([place]) 08/06/202011    Prior to Admission medications   Medication Sig Start Date End Date Taking? Authorizing Provider  amLODipine (NORVASC) 5 MG tablet Take 1 tablet by mouth daily. 05/24/14  Yes Historical Provider, MD   aspirin 81 MG tablet Take 81 mg by mouth daily.     Yes Historical Provider, MD  Coenzyme Q10 (CO Q 10 PO) Take 1 tablet by mouth daily.    Yes Historical Provider, MD  CRESTOR 20 MG tablet TAKE 1 TABLET BY MOUTH DAILY. 12/14/14  Yes Sherren Mocha, MD  dabigatran (PRADAXA) 150 MG CAPS capsule Take 1 capsule (150 mg total) by mouth 2 (two) times daily. 02/25/15  Yes Deboraha Sprang, MD  diazepam (VALIUM) 5 MG tablet Take 1 tablet (5 mg total) by mouth daily as needed for anxiety. 11/19/14  Yes Darlin Coco, MD  furosemide (LASIX) 40 MG tablet TAKE 1 TABLET BY MOUTH DAILY. 12/06/14  Yes Darlin Coco, MD  isosorbide mononitrate (IMDUR) 30 MG 24 hr tablet TAKE 1/2 TABLET BY MOUTH DAILY 12/17/14  Yes Darlin Coco, MD  KRILL OIL PO Take 1 tablet by mouth daily.   Yes Historical Provider, MD  latanoprost (XALATAN) 0.005 % ophthalmic solution Place 1 drop into both eyes at bedtime.  05/28/11  Yes Historical Provider, MD  lisinopril (PRINIVIL,ZESTRIL) 20 MG tablet Take 1 tablet (20 mg total) by mouth daily. 08/23/14  Yes Darlin Coco, MD  Magnesium Oxide (MAG-OXIDE PO) Take 1 tablet by mouth daily.   Yes Historical Provider, MD  metoprolol (LOPRESSOR) 100 MG tablet TAKE 1/2 TABLET (50 MG) BY MOUTH EVERY MORNING. 06/21/14  Yes Darlin Coco, MD  metoprolol (LOPRESSOR) 50 MG tablet TAKE 1/2 TABLET (25 MG) BY MOUTH EVERY  EVENING. 07/13/14  Yes Darlin Coco, MD  Multiple Vitamin (MULTIVITAMIN) tablet Take 1 tablet by mouth daily.     Yes Historical Provider, MD  niacin (SLO-NIACIN) 500 MG tablet Take 500 mg by mouth at bedtime.     Yes Historical Provider, MD  nitroGLYCERIN (NITROSTAT) 0.4 MG SL tablet Place 0.4 mg under the tongue every 5 (five) minutes as needed for chest pain (for chest pain (MAX of 3 doses)).   Yes Historical Provider, MD  potassium chloride (K-DUR,KLOR-CON) 10 MEQ tablet Take 10 mEq by mouth 2 (two) times daily.     Yes Historical Provider, MD  Thiamine HCl (VITAMIN B-1) 100  MG tablet Take 100 mg by mouth daily.     Yes Historical Provider, MD    Allergies  Allergen Reactions  . Cyclobenzaprine Hcl Other (See Comments)    Unspecified reaction pt doesn't know what reaction  . Tetracycline     Rash     Past Surgical History  Procedure Laterality Date  . Cabg      in 1982 and '84 with subsequent PTCI  . Appendectomy    . Tonsillectomy    . Adenoidectomy    . Cataract surgery       Bilateral cataract surgery  . Left elbow tendon surgery    . Cardiac catheterization   11/16/2009   . Left and right heart catheterization with coronary/graft angiogram N/A 02/01/2014    Procedure: LEFT AND RIGHT HEART CATHETERIZATION WITH Beatrix Fetters;  Surgeon: Sinclair Grooms, MD;  Location: [facility] CATH LAB;  Service: Cardiovascular;  Laterality: N/A;  . Tonsillectomy    . Hernia repair    . Cardiac defibrillator placement    . Transcatheter aortic valve replacement, transfemoral N/A 03/23/2014    Procedure: TRANSCATHETER AORTIC VALVE REPLACEMENT, TRANSFEMORAL;  Surgeon: Burnell Blanks, MD;  Location: [place];  Service: Open Heart Surgery;  Laterality: N/A;  . Tee without cardioversion N/A 03/23/2014    Procedure: TRANSESOPHAGEAL ECHOCARDIOGRAM (TEE);  Surgeon: Burnell Blanks, MD;  Location: [place];  Service: Open Heart Surgery;  Laterality: N/A;    Social History  Substance Use Topics  . Smoking status: Former Smoker    Quit date: 10/25/1952  . Smokeless tobacco: Never Used  . Alcohol Use: 3.0 - 3.6 oz/week    5-6 Glasses of wine per week     Comment: 5-6 times a week    Family History  Problem Relation Age of Onset  . Coronary artery disease Other     family history is dignificant for early CAD in several members  . Heart attack Father 24    Medication list has been reviewed and updated.  Physical Examination:  Physical Exam  Constitutional: He is oriented to person, place, and time. He appears well-developed and well-nourished.  No distress.  HENT:  Head: Normocephalic and atraumatic.  Right Ear: Hearing, tympanic membrane, external ear and ear canal normal.  Left Ear: Hearing, tympanic membrane, external ear and ear canal normal.  Nose: Nose normal. Right sinus exhibits no maxillary sinus tenderness and no frontal sinus tenderness. Left sinus exhibits no maxillary sinus tenderness and no frontal sinus tenderness.  Mouth/Throat: Uvula is midline and mucous membranes are normal. Posterior oropharyngeal erythema present. No oropharyngeal exudate or posterior oropharyngeal edema.  Eyes: Conjunctivae and lids are normal. Right eye exhibits no discharge. Left eye exhibits no discharge. No scleral icterus.  Cardiovascular: Normal rate, regular rhythm and normal pulses.   Appears to be in sinus rhythm  currently  Pulmonary/Chest: Effort normal and breath sounds normal. No respiratory distress. He has no decreased breath sounds. He has no wheezes. He has no rhonchi. He has no rales.  Defibrillator in place, left side of chest.  Musculoskeletal: Normal range of motion.  Lymphadenopathy:       Head (right side): No submental, no submandibular and no tonsillar adenopathy present.       Head (left side): No submental, no submandibular and no tonsillar adenopathy present.    He has no cervical adenopathy.  Neurological: He is alert and oriented to person, place, and time.  Skin: Skin is warm, dry and intact. No lesion and no rash noted.  Psychiatric: He has a normal mood and affect. His speech is normal and behavior is normal. Thought content normal.   BP 112/60 mmHg  Pulse 84  Temp(Src) 97.8 F (36.6 C) (Oral)  Resp 16  Ht 5\' 5"  (1.651 m)  Wt 159 lb (72.122 kg)  BMI 26.46 kg/m2  SpO2 93%  Results for orders placed or performed in visit on 03/25/15  POCT CBC  Result Value Ref Range   WBC 4.9 4.6 - 10.2 K/uL   Lymph, poc 1.3 0.6 - 3.4   POC LYMPH PERCENT 27.3 10 - 50 %L   MID (cbc) 0.3 0 - 0.9   POC MID % 6.9 0 - 12  %M   POC Granulocyte 3.2 2 - 6.9   Granulocyte percent 65.8 37 - 80 %G   RBC 4.14 (A) 4.69 - 6.13 M/uL   Hemoglobin 13.2 (A) 14.1 - 18.1 g/dL   HCT, POC 38.6 (A) 43.5 - 53.7 %   MCV 93.2 80 - 97 fL   MCH, POC 31.9 (A) 27 - 31.2 pg   MCHC 34.2 31.8 - 35.4 g/dL   RDW, POC 13.6 %   Platelet Count, POC 99 (A) 142 - 424 K/uL   MPV 8.1 0 - 99.8 fL  POCT Influenza A/B  Result Value Ref Range   Influenza A, POC Negative Negative   Influenza B, POC Negative Negative    Assessment and Plan:  1. Viral URI 2. Myalgia Suspect viral etiology. Pt appears well, vitals wnl, exam benign. CBC with normal white count. Flu negative.  Counseled on supportive care. I advised if anything changes, he should call and let me know. Return if not getting better in 5 days. - POCT CBC - POCT Influenza A/B   Benjaman Pott. Drenda Freeze, MHS [facility]  03/27/2015

## 2015-03-30 ENCOUNTER — Encounter: Payer: Self-pay | Admitting: *Deleted

## 2015-04-12 ENCOUNTER — Encounter: Payer: Self-pay | Admitting: Cardiovascular Disease

## 2015-04-12 ENCOUNTER — Ambulatory Visit (INDEPENDENT_AMBULATORY_CARE_PROVIDER_SITE_OTHER): Payer: Medicare Other | Admitting: Cardiovascular Disease

## 2015-04-12 ENCOUNTER — Other Ambulatory Visit: Payer: Self-pay

## 2015-04-12 ENCOUNTER — Ambulatory Visit (HOSPITAL_COMMUNITY): Payer: Medicare Other | Attending: Cardiovascular Disease

## 2015-04-12 ENCOUNTER — Other Ambulatory Visit (INDEPENDENT_AMBULATORY_CARE_PROVIDER_SITE_OTHER): Payer: Medicare Other | Admitting: *Deleted

## 2015-04-12 VITALS — BP 132/84 | HR 84 | Ht 65.0 in | Wt 157.0 lb

## 2015-04-12 DIAGNOSIS — Z952 Presence of prosthetic heart valve: Secondary | ICD-10-CM | POA: Diagnosis not present

## 2015-04-12 DIAGNOSIS — Z953 Presence of xenogenic heart valve: Secondary | ICD-10-CM

## 2015-04-12 DIAGNOSIS — I10 Essential (primary) hypertension: Secondary | ICD-10-CM | POA: Diagnosis not present

## 2015-04-12 DIAGNOSIS — E7800 Pure hypercholesterolemia, unspecified: Secondary | ICD-10-CM

## 2015-04-12 DIAGNOSIS — Z951 Presence of aortocoronary bypass graft: Secondary | ICD-10-CM | POA: Diagnosis not present

## 2015-04-12 DIAGNOSIS — I350 Nonrheumatic aortic (valve) stenosis: Secondary | ICD-10-CM | POA: Insufficient documentation

## 2015-04-12 DIAGNOSIS — Z954 Presence of other heart-valve replacement: Secondary | ICD-10-CM | POA: Diagnosis not present

## 2015-04-12 LAB — LIPID PANEL
CHOL/HDL RATIO: 3.2 ratio (ref <=5.0)
Cholesterol: 128 mg/dL (ref 125–200)
HDL: 40 mg/dL (ref >=40)
LDL CALC: 69 mg/dL (ref <130)
Triglycerides: 96 mg/dL (ref <150)
VLDL: 19 mg/dL (ref <30)

## 2015-04-12 LAB — HEPATIC FUNCTION PANEL
ALK PHOS: 51 U/L (ref 40–115)
ALT: 13 U/L (ref 9–46)
AST: 22 U/L (ref 10–35)
Albumin: 4.1 g/dL (ref 3.6–5.1)
BILIRUBIN TOTAL: 0.7 mg/dL (ref 0.2–1.2)
Bilirubin, Direct: 0.2 mg/dL (ref <=0.2)
Indirect Bilirubin: 0.5 mg/dL (ref 0.2–1.2)
Total Protein: 6.7 g/dL (ref 6.1–8.1)

## 2015-04-12 LAB — BASIC METABOLIC PANEL
BUN: 24 mg/dL (ref 7–25)
CALCIUM: 10.1 mg/dL (ref 8.6–10.3)
CHLORIDE: 105 mmol/L (ref 98–110)
CO2: 25 mmol/L (ref 20–31)
Creat: 1.02 mg/dL (ref 0.70–1.18)
GLUCOSE: 98 mg/dL (ref 65–99)
Potassium: 4.2 mmol/L (ref 3.5–5.3)
SODIUM: 138 mmol/L (ref 135–146)

## 2015-04-12 MED ORDER — NITROGLYCERIN 0.4 MG SL SUBL: 0.4000 mg | SUBLINGUAL_TABLET | SUBLINGUAL | Status: AC | PRN

## 2015-04-12 NOTE — Progress Notes (Signed)
Quick Note:  Please make copy of labs for patient visit. ______ 

## 2015-04-12 NOTE — Progress Notes (Signed)
Chief Complaint  Patient presents with  . Follow-up    tavr      History of Present Illness: 82 yo male with history of CAD s/p CABG (1982 with redo 1994), ischemic cardiomyopathy, out-of-hospital cardiac arrest in 2011 with successful resuscitation and subsequent placement of Medtronic ICD, and progressive aortic stenosis who is now s/p TAVR on 03/23/14. He is here today for one year follow up following his TAVR procedure. His post procedure hospitalization was uneventful.   He tells me today that he is feeling well. He has no chest pain and no dyspnea. He is exercising every day on the stationary bike and on the treadmilll. He denies any PND, orthopnea, or lower extremity edema. He denies dizziness, near syncope or syncope. Echo reviewed today and shows preserved LV systolic function with normally functioning bioprosthetic aortic valve. There is trivial AI.   Primary Care Physician/Primary cardiologist: Mare Ferrari   Past Medical History  Diagnosis Date  . Cardiac arrest [facility])     aborted, atrial fibrillation/flutter  . Presence of automatic cardioverter/defibrillator (AICD)     Medtronic, remote-yes.   . Coronary artery disease   . Aortic stenosis, severe     s/p TAVR with Edwards Sapien 3 THV (size 26 mm, model # 9600TFX, serial # [id number])  . Peripheral vascular disease ([place])   . Essential hypertension   . Hyperlipidemia   . Angina pectoris      Recurrent angina pectoris  . Claudication ([place])   . Myocardial infarction ([place])   . Heart murmur   . Kidney stones   . History of hiatal hernia   . Headache   . Arthritis     Past Surgical History  Procedure Laterality Date  . Coronary artery bypass graft      in 1982 and '84 with subsequent PTCI  . Appendectomy    . Tonsillectomy    . Adenoidectomy    . Cataract surgery       Bilateral cataract surgery  . Left elbow tendon surgery    . Cardiac catheterization   11/16/2009   . Left and right heart catheterization with  coronary/graft angiogram N/A 02/01/2014    Procedure: LEFT AND RIGHT HEART CATHETERIZATION WITH Beatrix Fetters;  Surgeon: Sinclair Grooms, MD;  Location: [facility] CATH LAB;  Service: Cardiovascular;  Laterality: N/A;  . Tonsillectomy    . Hernia repair    . Cardiac defibrillator placement    . Transcatheter aortic valve replacement, transfemoral N/A 03/23/2014    Procedure: TRANSCATHETER AORTIC VALVE REPLACEMENT, TRANSFEMORAL;  Surgeon: Burnell Blanks, MD;  Location: [place];  Service: Open Heart Surgery;  Laterality: N/A;  . Tee without cardioversion N/A 03/23/2014    Procedure: TRANSESOPHAGEAL ECHOCARDIOGRAM (TEE);  Surgeon: Burnell Blanks, MD;  Location: [place];  Service: Open Heart Surgery;  Laterality: N/A;    Current Outpatient Prescriptions  Medication Sig Dispense Refill  . amLODipine (NORVASC) 5 MG tablet Take 1 tablet by mouth daily.    Marland Kitchen aspirin 81 MG tablet Take 81 mg by mouth daily.      . Coenzyme Q10 (CO Q 10 PO) Take 1 tablet by mouth daily.     . CRESTOR 20 MG tablet TAKE 1 TABLET BY MOUTH DAILY. 90 tablet 1  . dabigatran (PRADAXA) 150 MG CAPS capsule Take 1 capsule (150 mg total) by mouth 2 (two) times daily. 60 capsule 6  . diazepam (VALIUM) 5 MG tablet Take 1 tablet (5 mg total) by mouth daily as  needed for anxiety. 30 tablet 5  . furosemide (LASIX) 40 MG tablet TAKE 1 TABLET BY MOUTH DAILY. 90 tablet 2  . isosorbide mononitrate (IMDUR) 30 MG 24 hr tablet TAKE 1/2 TABLET BY MOUTH DAILY 15 tablet 5  . KRILL OIL PO Take 1 tablet by mouth daily.    Marland Kitchen latanoprost (XALATAN) 0.005 % ophthalmic solution Place 1 drop into both eyes at bedtime.     Marland Kitchen lisinopril (PRINIVIL,ZESTRIL) 20 MG tablet Take 1 tablet (20 mg total) by mouth daily. 90 tablet 1  . Magnesium Oxide (MAG-OXIDE PO) Take 1 tablet by mouth daily.    . metoprolol (LOPRESSOR) 100 MG tablet TAKE 1/2 TABLET (50 MG) BY MOUTH EVERY MORNING. 45 tablet 3  . metoprolol (LOPRESSOR) 50 MG tablet TAKE 1/2  TABLET (25 MG) BY MOUTH EVERY EVENING. 45 tablet PRN  . Multiple Vitamin (MULTIVITAMIN) tablet Take 1 tablet by mouth daily.      . niacin (SLO-NIACIN) 500 MG tablet Take 500 mg by mouth at bedtime.      . nitroGLYCERIN (NITROSTAT) 0.4 MG SL tablet Place 1 tablet (0.4 mg total) under the tongue every 5 (five) minutes as needed for chest pain (for chest pain (MAX of 3 doses)). 25 tablet 6  . potassium chloride (K-DUR,KLOR-CON) 10 MEQ tablet Take 10 mEq by mouth 2 (two) times daily.      . Thiamine HCl (VITAMIN B-1) 100 MG tablet Take 100 mg by mouth daily.       No current facility-administered medications for this visit.    Allergies  Allergen Reactions  . Cyclobenzaprine Hcl Other (See Comments)    Unspecified reaction pt doesn't know what reaction  . Tetracycline     Rash     Social History   Social History  . Marital Status: Married    Spouse Name: N/A  . Number of Children: 3  . Years of Education: N/A   Occupational History  . Retired    Social History Main Topics  . Smoking status: Former Smoker    Quit date: 10/25/1952  . Smokeless tobacco: Never Used  . Alcohol Use: 3.0 - 3.6 oz/week    5-6 Glasses of wine per week     Comment: 5-6 times a week  . Drug Use: No  . Sexual Activity: Not on file   Other Topics Concern  . Not on file   Social History Narrative    Family History  Problem Relation Age of Onset  . Coronary artery disease Other     family history is dignificant for early CAD in several members  . Heart attack Father 64    Review of Systems:  As stated in the HPI and otherwise negative.   BP 132/84 mmHg  Pulse 84  Ht 5\' 5"  (1.651 m)  Wt 157 lb (71.215 kg)  BMI 26.13 kg/m2  Physical Examination: General: Well developed, well nourished, NAD HEENT: OP clear, mucus membranes moist SKIN: warm, dry. No rashes. Neuro: No focal deficits Musculoskeletal: Muscle strength 5/5 all ext Psychiatric: Mood and affect normal Neck: No JVD, no carotid  bruits, no thyromegaly, no lymphadenopathy. Lungs:Clear bilaterally, no wheezes, rhonci, crackles Cardiovascular: Regular rate and rhythm. Soft diastolic murmur. No gallops or rubs. Abdomen:Soft. Bowel sounds present. Non-tender.  Extremities: No lower extremity edema. Pulses are 2 + in the bilateral DP/PT.  Echo 04/21/14: Left ventricle: The cavity size was normal. Wall thickness was normal. Systolic function was normal. The estimated ejection fraction was in the range of  60% to 65%. - Mitral valve: Calcified annulus. - Left atrium: The atrium was moderately dilated. - Right atrium: The atrium was moderately dilated. - Atrial septum: No defect or patent foramen ovale was identified. - Pericardium, extracardiac: A trivial pericardial effusion was identified posterior to the heart. Aortic valve: 26 mm Sapien TAVR valve in place with mild peri prosthetic regurgitation along the aortic mitral curtain. Doppler:  VTI ratio of LVOT to aortic valve: 0.5. Peak velocity ratio of LVOT to aortic valve: 0.48. Mean velocity ratio of LVOT to aortic valve: 0.55.  Mean gradient (S): 13 mm Hg. Peak gradient (S): 24 mm Hg.  EKG:  EKG is not ordered today. The ekg ordered today demonstrates   Recent Labs: 08/06/2014: Platelets 149.0* 12/14/2014: ALT 13; BUN 32*; Creatinine, Ser 1.05; Potassium 4.9; Sodium 143; TSH 1.64 03/25/2015: Hemoglobin 13.2*   Lipid Panel    Component Value Date/Time   CHOL 151 12/14/2014 0854   TRIG 95.0 12/14/2014 0854   HDL 45.60 12/14/2014 0854   CHOLHDL 3 12/14/2014 0854   VLDL 19.0 12/14/2014 0854   LDLCALC 87 12/14/2014 0854     Wt Readings from Last 3 Encounters:  04/12/15 157 lb (71.215 kg)  03/25/15 159 lb (72.122 kg)  02/25/15 159 lb 3.2 oz (72.213 kg)     Other studies Reviewed: Additional studies/ records that were reviewed today include: . Review of the above records demonstrates:    Assessment and Plan:   1. Severe Aortic valve  stenosis: He had severe symptomatic aortic valve stenosis. Now s/p TAVR on 03/23/14. Here today for one year follow up. There is triviial paravalvular leak which is unchanged by echo today. LV function is normal. He is NYHA Class 1. Will continue ASA. He is back on Pradaxa for atrial fibrillation.   Current medicines are reviewed at length with the patient today.  The patient does not have concerns regarding medicines.  The following changes have been made:  no change  Labs/ tests ordered today include:  No orders of the defined types were placed in this encounter.     Disposition:   FU with Dr. Mare Ferrari as planned.     Signed, Lauree Chandler, MD 04/12/2015 11:13 AM    [facility] [place], [place], [place]  [address] Phone: [phone]; Fax: [phone]

## 2015-04-12 NOTE — Addendum Note (Signed)
Addended by: Eulis Foster on: 04/12/2015 10:20 AM   Modules accepted: Orders

## 2015-04-12 NOTE — Patient Instructions (Signed)
Medication Instructions:  Your physician recommends that you continue on your current medications as directed. Please refer to the Current Medication list given to you today.   Labwork: None (already done today)  Testing/Procedures: none  Follow-Up: Will be arranged after you see Dr. Mare Ferrari.   Any Other Special Instructions Will Be Listed Below (If Applicable).     If you need a refill on your cardiac medications before your next appointment, please call your pharmacy.

## 2015-04-15 ENCOUNTER — Ambulatory Visit (INDEPENDENT_AMBULATORY_CARE_PROVIDER_SITE_OTHER): Payer: Medicare Other | Admitting: Cardiology

## 2015-04-15 ENCOUNTER — Encounter: Payer: Self-pay | Admitting: Cardiology

## 2015-04-15 VITALS — BP 122/70 | HR 84 | Ht 66.75 in | Wt 160.1 lb

## 2015-04-15 DIAGNOSIS — Z954 Presence of other heart-valve replacement: Secondary | ICD-10-CM

## 2015-04-15 DIAGNOSIS — Z953 Presence of xenogenic heart valve: Secondary | ICD-10-CM

## 2015-04-15 DIAGNOSIS — I255 Ischemic cardiomyopathy: Secondary | ICD-10-CM

## 2015-04-15 NOTE — Patient Instructions (Signed)
Medication Instructions:  Your physician recommends that you continue on your current medications as directed. Please refer to the Current Medication list given to you today.  Labwork: NONE  Testing/Procedures: NONE  Follow-Up: Your physician wants you to follow-up in: [place] will receive a reminder letter in the mail two months in advance. If you don't receive a letter, please call our office to schedule the follow-up appointment.   If you need a refill on your cardiac medications before your next appointment, please call your pharmacy.

## 2015-04-15 NOTE — Progress Notes (Signed)
Cardiology Office Note   Date:  04/15/2015   ID:  [name], DOB Jul 15, 1933, MRN [id number]  PCP:  Warren Danes, MD  Cardiologist: Darlin Coco MD  Chief Complaint  Patient presents with  . Follow-up    atrial fibrillation . Patient denies chest pain, shortness of breath, and le edema      History of Present Illness: [name] is a 82 y.o. male who presents for vaginal follow-up visit  [name] is a 82 y.o. male who presents for scheduled four-month follow-up visit. 82 yo male with history of CAD s/p CABG (1982 with redo 1994), ischemic cardiomyopathy, out-of-hospital cardiac arrest in 2011 with successful resuscitation and subsequent placement of Medtronic ICD, and progressive aortic stenosis who is now s/p TAVR on 03/23/14.   The patient has been feeling well. He has been playing golf. This past summer he was able to play 18 holes but of course does take a cart. He will occasionally get mild angina in his throat if he is walking uphill. He does not have to take sublingual nitroglycerin. He merely stops and rests. He is on Imdur.  He is exercising regularly. He walks on his home treadmill for 20 minutes and he also does a rowing machine and light weights.  He has not been as regular with his exercise over the holidays and his weight is up several pounds.  The patient and his wife are planning another trip to [place] in June.  They will be gone for 16 days.  Past Medical History  Diagnosis Date  . Cardiac arrest [facility])     aborted, atrial fibrillation/flutter  . Presence of automatic cardioverter/defibrillator (AICD)     Medtronic, remote-yes.   . Coronary artery disease   . Aortic stenosis, severe     s/p TAVR with Edwards Sapien 3 THV (size 26 mm, model # 9600TFX, serial # [id number])  . Peripheral vascular disease ([place])   . Essential hypertension   . Hyperlipidemia   . Angina pectoris      Recurrent angina pectoris  . Claudication ([place])   .  Myocardial infarction ([place])   . Heart murmur   . Kidney stones   . History of hiatal hernia   . Headache   . Arthritis     Past Surgical History  Procedure Laterality Date  . Coronary artery bypass graft      in 1982 and '84 with subsequent PTCI  . Appendectomy    . Tonsillectomy    . Adenoidectomy    . Cataract surgery       Bilateral cataract surgery  . Left elbow tendon surgery    . Cardiac catheterization   11/16/2009   . Left and right heart catheterization with coronary/graft angiogram N/A 02/01/2014    Procedure: LEFT AND RIGHT HEART CATHETERIZATION WITH Beatrix Fetters;  Surgeon: Sinclair Grooms, MD;  Location[facility] CATH LAB;  Service: Cardiovascular;  Laterality: N/A;  . Tonsillectomy    . Hernia repair    . Cardiac defibrillator placement    . Transcatheter aortic valve replacement, transfemoral N/A 03/23/2014    Procedure: TRANSCATHETER AORTIC VALVE REPLACEMENT, TRANSFEMORAL;  Surgeon: Burnell Blanks, MD;  Location: [place];  Service: Open Heart Surgery;  Laterality: N/A;  . Tee without cardioversion N/A 03/23/2014    Procedure: TRANSESOPHAGEAL ECHOCARDIOGRAM (TEE);  Surgeon: Burnell Blanks, MD;  Location: [place];  Service: Open Heart Surgery;  Laterality: N/A;     Current Outpatient Prescriptions  Medication Sig Dispense Refill  . amLODipine (NORVASC) 5 MG tablet Take 1 tablet by mouth daily.    Marland Kitchen aspirin 81 MG tablet Take 81 mg by mouth daily.      . Coenzyme Q10 (CO Q 10 PO) Take 1 tablet by mouth daily.     . CRESTOR 20 MG tablet TAKE 1 TABLET BY MOUTH DAILY. 90 tablet 1  . dabigatran (PRADAXA) 150 MG CAPS capsule Take 1 capsule (150 mg total) by mouth 2 (two) times daily. 60 capsule 6  . diazepam (VALIUM) 5 MG tablet Take 1 tablet (5 mg total) by mouth daily as needed for anxiety. 30 tablet 5  . furosemide (LASIX) 40 MG tablet TAKE 1 TABLET BY MOUTH DAILY. 90 tablet 2  . isosorbide mononitrate (IMDUR) 30 MG 24 hr tablet TAKE 1/2 TABLET BY  MOUTH DAILY 15 tablet 5  . KRILL OIL PO Take 1 tablet by mouth daily.    Marland Kitchen latanoprost (XALATAN) 0.005 % ophthalmic solution Place 1 drop into both eyes at bedtime.     Marland Kitchen lisinopril (PRINIVIL,ZESTRIL) 20 MG tablet Take 1 tablet (20 mg total) by mouth daily. 90 tablet 1  . Magnesium Oxide (MAG-OXIDE PO) Take 1 tablet by mouth daily.    . metoprolol (LOPRESSOR) 100 MG tablet TAKE 1/2 TABLET (50 MG) BY MOUTH EVERY MORNING. 45 tablet 3  . metoprolol (LOPRESSOR) 50 MG tablet TAKE 1/2 TABLET (25 MG) BY MOUTH EVERY EVENING. 45 tablet PRN  . Multiple Vitamin (MULTIVITAMIN) tablet Take 1 tablet by mouth daily.      . niacin (SLO-NIACIN) 500 MG tablet Take 500 mg by mouth at bedtime.      . nitroGLYCERIN (NITROSTAT) 0.4 MG SL tablet Place 1 tablet (0.4 mg total) under the tongue every 5 (five) minutes as needed for chest pain (for chest pain (MAX of 3 doses)). 25 tablet 6  . potassium chloride (K-DUR,KLOR-CON) 10 MEQ tablet Take 10 mEq by mouth 2 (two) times daily.      . Thiamine HCl (VITAMIN B-1) 100 MG tablet Take 100 mg by mouth daily.       No current facility-administered medications for this visit.    Allergies:   Cyclobenzaprine hcl and Tetracycline    Social History:  The patient  reports that he quit smoking about 62 years ago. He has never used smokeless tobacco. He reports that he drinks about 3.0 - 3.6 oz of alcohol per week. He reports that he does not use illicit drugs.   Family History:  The patient's family history includes Coronary artery disease in his other; Heart attack (age of onset: 69) in his father.    ROS:  Please see the history of present illness.   Otherwise, review of systems are positive for none.   All other systems are reviewed and negative.    PHYSICAL EXAM: VS:  BP 122/70 mmHg  Pulse 84  Ht 5' 6.75" (1.695 m)  Wt 160 lb 1.9 oz (72.63 kg)  BMI 25.28 kg/m2 , BMI Body mass index is 25.28 kg/(m^2). GEN: Well nourished, well developed, in no acute distress HEENT:  normal Neck: no JVD, carotid bruits, or masses Cardiac: RRR; no, rubs, or gallops,no edema .  There is a soft systolic ejection murmur across the prosthetic aortic valve. Respiratory:  clear to auscultation bilaterally, normal work of breathing.  ICD in left upper chest GI: soft, nontender, nondistended, + BS MS: no deformity or atrophy Skin: warm and dry, no rash Neuro:  Strength and  sensation are intact Psych: euthymic mood, full affect   EKG:  EKG is not ordered today.    Recent Labs: 08/06/2014: Platelets 149.0* 12/14/2014: TSH 1.64 03/25/2015: Hemoglobin 13.2* 04/12/2015: ALT 13; BUN 24; Creat 1.02; Potassium 4.2; Sodium 138    Lipid Panel    Component Value Date/Time   CHOL 128 04/12/2015 1020   TRIG 96 04/12/2015 1020   HDL 40 04/12/2015 1020   CHOLHDL 3.2 04/12/2015 1020   VLDL 19 04/12/2015 1020   LDLCALC 69 04/12/2015 1020      Wt Readings from Last 3 Encounters:  04/15/15 160 lb 1.9 oz (72.63 kg)  04/12/15 157 lb (71.215 kg)  03/25/15 159 lb (72.122 kg)         ASSESSMENT AND PLAN:  1. Ischemic heart disease status post CABG in 1982, status post redo CABG in 1994. Status post multiple PCI procedures. Recent increase in exertional throat pain consistent with angina pectoris. 2. history of out of hospital cardiac arrest while playing tennis on 08/28/09 with successful resuscitation. He has a Medtronic ICD. 3. Moderate aortic stenosis with peak gradient of 61 and mean gradient 35 by echo 01/12/13.  4.Successful TAVR 03/23/14  5. erectile dysfunction, not a candidate for Cialis because he is on daily nitrates. 6. Hypercholesterolemia on rosuvastatin 7. strongly positive treadmill Myoview stress test on 01/28/14 showing a large reversible anterior wall defect and showing an ejection fraction of 32%. Cardiac catheterization on 02/01/14 by Dr. Daneen Schick shows patent bypass grafts very similar to the last heart catheterization in 2012. The left internal  mammary graft to the LAD is widely patent. The LAD itself is severely and diffusely diseased both antegrade and retrograde from the graft insertion site and this is felt likely the source of the patient's exertional angina. No focal sites for intervention exist.                Current medicines are reviewed at length with the patient today.  The patient does not have concerns regarding medicines.  The following changes have been made:  no change  Labs/ tests ordered today include:  No orders of the defined types were placed in this encounter.   Disposition: The patient will continue current medication.  He will follow-up in 4 months with Dr.McAlhany. The patient will also investigate getting a primary care provider within the [place] if possible  Signed, Darlin Coco MD 04/15/2015 1:10 PM    [place], [place],   [address] Phone: [phone]; Fax: [phone]

## 2015-05-18 DIAGNOSIS — H401291 Low-tension glaucoma, unspecified eye, mild stage: Secondary | ICD-10-CM | POA: Diagnosis not present

## 2015-05-18 DIAGNOSIS — H401232 Low-tension glaucoma, bilateral, moderate stage: Secondary | ICD-10-CM | POA: Diagnosis not present

## 2015-05-18 DIAGNOSIS — H04129 Dry eye syndrome of unspecified lacrimal gland: Secondary | ICD-10-CM | POA: Diagnosis not present

## 2015-05-18 DIAGNOSIS — H02834 Dermatochalasis of left upper eyelid: Secondary | ICD-10-CM | POA: Diagnosis not present

## 2015-05-28 ENCOUNTER — Other Ambulatory Visit: Payer: Self-pay | Admitting: Cardiology

## 2015-05-30 ENCOUNTER — Ambulatory Visit: Payer: Medicare Other

## 2015-05-31 DIAGNOSIS — Z954 Presence of other heart-valve replacement: Secondary | ICD-10-CM | POA: Diagnosis not present

## 2015-05-31 DIAGNOSIS — Z951 Presence of aortocoronary bypass graft: Secondary | ICD-10-CM | POA: Diagnosis not present

## 2015-05-31 DIAGNOSIS — I10 Essential (primary) hypertension: Secondary | ICD-10-CM | POA: Diagnosis not present

## 2015-05-31 DIAGNOSIS — R05 Cough: Secondary | ICD-10-CM | POA: Diagnosis not present

## 2015-05-31 DIAGNOSIS — Z5181 Encounter for therapeutic drug level monitoring: Secondary | ICD-10-CM | POA: Diagnosis not present

## 2015-05-31 DIAGNOSIS — I2510 Atherosclerotic heart disease of native coronary artery without angina pectoris: Secondary | ICD-10-CM | POA: Diagnosis not present

## 2015-05-31 DIAGNOSIS — R509 Fever, unspecified: Secondary | ICD-10-CM | POA: Diagnosis not present

## 2015-05-31 DIAGNOSIS — Z515 Encounter for palliative care: Secondary | ICD-10-CM | POA: Diagnosis not present

## 2015-05-31 DIAGNOSIS — Z9981 Dependence on supplemental oxygen: Secondary | ICD-10-CM | POA: Diagnosis not present

## 2015-05-31 DIAGNOSIS — E785 Hyperlipidemia, unspecified: Secondary | ICD-10-CM | POA: Diagnosis not present

## 2015-05-31 DIAGNOSIS — J189 Pneumonia, unspecified organism: Secondary | ICD-10-CM | POA: Diagnosis not present

## 2015-05-31 DIAGNOSIS — F419 Anxiety disorder, unspecified: Secondary | ICD-10-CM | POA: Diagnosis present

## 2015-06-03 ENCOUNTER — Telehealth: Payer: Self-pay | Admitting: Cardiology

## 2015-06-03 NOTE — Telephone Encounter (Signed)
New message      Pt was in the hosp in [place] with pneumonia.  Pt want to talk to the nurse

## 2015-06-06 NOTE — Telephone Encounter (Signed)
Spoke with patient last week and  Dr. Mare Ferrari received records Will fax to PCP

## 2015-06-07 ENCOUNTER — Other Ambulatory Visit: Payer: Self-pay | Admitting: Physician Assistant

## 2015-06-10 ENCOUNTER — Ambulatory Visit (INDEPENDENT_AMBULATORY_CARE_PROVIDER_SITE_OTHER): Payer: Medicare Other | Admitting: *Deleted

## 2015-06-10 DIAGNOSIS — I255 Ischemic cardiomyopathy: Secondary | ICD-10-CM

## 2015-06-10 LAB — CUP PACEART REMOTE DEVICE CHECK
Brady Statistic AP VP Percent: 0.38 %
Brady Statistic AP VS Percent: 0 %
Brady Statistic AS VP Percent: 79.14 %
Brady Statistic RA Percent Paced: 0.38 %
Brady Statistic RV Percent Paced: 79.52 %
Date Time Interrogation Session: 20170303051707
HIGH POWER IMPEDANCE MEASURED VALUE: 38 Ohm
HighPow Impedance: 46 Ohm
Implantable Lead Implant Date: 20110601
Implantable Lead Location: 753860
Implantable Lead Model: 5076
Lead Channel Impedance Value: 342 Ohm
Lead Channel Pacing Threshold Amplitude: 1.375 V
Lead Channel Pacing Threshold Pulse Width: 0.4 ms
Lead Channel Sensing Intrinsic Amplitude: 1.625 mV
Lead Channel Sensing Intrinsic Amplitude: 1.625 mV
Lead Channel Sensing Intrinsic Amplitude: 1.625 mV
Lead Channel Setting Pacing Amplitude: 2 V
Lead Channel Setting Pacing Pulse Width: 0.4 ms
Lead Channel Setting Sensing Sensitivity: 0.3 mV
MDC IDC LEAD IMPLANT DT: 20110601
MDC IDC LEAD LOCATION: 753859
MDC IDC MSMT BATTERY VOLTAGE: 2.67 V
MDC IDC MSMT LEADCHNL RA IMPEDANCE VALUE: 437 Ohm
MDC IDC MSMT LEADCHNL RA PACING THRESHOLD AMPLITUDE: 0.75 V
MDC IDC MSMT LEADCHNL RA SENSING INTR AMPL: 1.625 mV
MDC IDC MSMT LEADCHNL RV PACING THRESHOLD PULSEWIDTH: 0.4 ms
MDC IDC SET LEADCHNL RV PACING AMPLITUDE: 2.75 V
MDC IDC STAT BRADY AS VS PERCENT: 20.48 %

## 2015-06-10 NOTE — Progress Notes (Signed)
Remote ICD transmission.   

## 2015-06-17 NOTE — Progress Notes (Signed)
Abnormal remote check: 100% AF since last remote interrogation.  +Pradaxa Optivol elevating and activity level decreasing  Plan to have patient come in for OV to discuss need for DCCV.  message sent to scheduler.

## 2015-06-22 ENCOUNTER — Encounter: Payer: Self-pay | Admitting: Cardiology

## 2015-06-22 NOTE — Progress Notes (Signed)
Electrophysiology Office Note Date: 06/23/2015  ID:  [name], DOB 1933-11-11, MRN [id number]  PCP: Carlos Rail, MD Primary Cardiologist: Mare Ferrari ->[name] Electrophysiologist: Caryl Comes  CC: atrial fibrillation follow up  [name] is a 82 y.o. male seen today for Dr Caryl Comes.  He presents today for follow up after recent remote interrogation demonstrated persistent atrial fibrillation.  Since last being seen in our clinic, the patient reports doing reasonably well. He was playing golf in [name] and was admitted for pneumonia recently.  He denies chest pain, palpitations, dyspnea, PND, orthopnea, nausea, vomiting, dizziness, syncope, edema, weight gain, or early satiety.  He has not had ICD shocks.   Device History: MDT dual chamber ICD implanted 2011 for secondary prevention  History of appropriate therapy: Yes History of AAD therapy: No   Past Medical History  Diagnosis Date  . Cardiac arrest [facility])     aborted, atrial fibrillation/flutter  . Presence of automatic cardioverter/defibrillator (AICD)     Medtronic, remote-yes.   . Coronary artery disease   . Aortic stenosis, severe     s/p TAVR with Edwards Sapien 3 THV (size 26 mm, model # 9600TFX, serial # [id number])  . Peripheral vascular disease ([place])   . Essential hypertension   . Hyperlipidemia   . Angina pectoris      Recurrent angina pectoris  . Claudication ([place])   . Myocardial infarction ([place])   . Heart murmur   . Kidney stones   . History of hiatal hernia   . Headache   . Arthritis    Past Surgical History  Procedure Laterality Date  . Coronary artery bypass graft      in 1982 and '84 with subsequent PTCI  . Appendectomy    . Tonsillectomy    . Adenoidectomy    . Cataract surgery       Bilateral cataract surgery  . Left elbow tendon surgery    . Cardiac catheterization   11/16/2009   . Left and right heart catheterization with coronary/graft angiogram N/A 02/01/2014    Procedure: LEFT AND  RIGHT HEART CATHETERIZATION WITH Beatrix Fetters;  Surgeon: Sinclair Grooms, MD;  Location: [facility] CATH LAB;  Service: Cardiovascular;  Laterality: N/A;  . Tonsillectomy    . Hernia repair    . Cardiac defibrillator placement    . Transcatheter aortic valve replacement, transfemoral N/A 03/23/2014    Procedure: TRANSCATHETER AORTIC VALVE REPLACEMENT, TRANSFEMORAL;  Surgeon: Burnell Blanks, MD;  Location: [place];  Service: Open Heart Surgery;  Laterality: N/A;  . Tee without cardioversion N/A 03/23/2014    Procedure: TRANSESOPHAGEAL ECHOCARDIOGRAM (TEE);  Surgeon: Burnell Blanks, MD;  Location: [place];  Service: Open Heart Surgery;  Laterality: N/A;    Current Outpatient Prescriptions  Medication Sig Dispense Refill  . amLODipine (NORVASC) 5 MG tablet Take 1 tablet by mouth daily.    Marland Kitchen aspirin 81 MG tablet Take 81 mg by mouth daily.      . Coenzyme Q10 (CO Q 10 PO) Take 1 tablet by mouth daily.     . CRESTOR 20 MG tablet TAKE 1 TABLET BY MOUTH DAILY. 90 tablet 1  . dabigatran (PRADAXA) 150 MG CAPS capsule Take 1 capsule (150 mg total) by mouth 2 (two) times daily. 60 capsule 6  . diazepam (VALIUM) 5 MG tablet Take 1 tablet (5 mg total) by mouth daily as needed for anxiety. 30 tablet 5  . furosemide (LASIX) 40 MG tablet TAKE 1 TABLET  BY MOUTH DAILY. 90 tablet 2  . isosorbide mononitrate (IMDUR) 30 MG 24 hr tablet TAKE 1/2 TABLET BY MOUTH DAILY 15 tablet 5  . KRILL OIL PO Take 1 tablet by mouth daily.    Marland Kitchen latanoprost (XALATAN) 0.005 % ophthalmic solution Place 1 drop into both eyes at bedtime.     Marland Kitchen lisinopril (PRINIVIL,ZESTRIL) 20 MG tablet Take 1 tablet (20 mg total) by mouth daily. 90 tablet 1  . Magnesium Oxide (MAG-OXIDE PO) Take 1 tablet by mouth daily.    . metoprolol (LOPRESSOR) 100 MG tablet TAKE 1/2 TABLET (50 MG) BY MOUTH EVERY MORNING. 45 tablet 3  . metoprolol (LOPRESSOR) 50 MG tablet TAKE 1/2 TABLET (25 MG) BY MOUTH EVERY EVENING. 45 tablet PRN  . Multiple  Vitamin (MULTIVITAMIN) tablet Take 1 tablet by mouth daily.      . niacin (SLO-NIACIN) 500 MG tablet Take 500 mg by mouth at bedtime.      . nitroGLYCERIN (NITROSTAT) 0.4 MG SL tablet Place 1 tablet (0.4 mg total) under the tongue every 5 (five) minutes as needed for chest pain (for chest pain (MAX of 3 doses)). 25 tablet 6  . potassium chloride (K-DUR,KLOR-CON) 10 MEQ tablet Take 10 mEq by mouth 2 (two) times daily.      . Thiamine HCl (VITAMIN B-1) 100 MG tablet Take 100 mg by mouth daily.       No current facility-administered medications for this visit.    Allergies:   Cyclobenzaprine hcl and Tetracycline   Social History: Social History   Social History  . Marital Status: Married    Spouse Name: N/A  . Number of Children: 3  . Years of Education: N/A   Occupational History  . Retired    Social History Main Topics  . Smoking status: Former Smoker    Quit date: 10/25/1952  . Smokeless tobacco: Never Used  . Alcohol Use: 3.0 - 3.6 oz/week    5-6 Glasses of wine per week     Comment: 5-6 times a week  . Drug Use: No  . Sexual Activity: Not on file   Other Topics Concern  . Not on file   Social History Narrative    Family History: Family History  Problem Relation Age of Onset  . Coronary artery disease Other     family history is dignificant for early CAD in several members  . Heart attack Father 72    Review of Systems: All other systems reviewed and are otherwise negative except as noted above.   Physical Exam: VS:  BP 110/60 mmHg  Pulse 62  Ht 5\' 7"  (1.702 m)  Wt 155 lb 9.6 oz (70.58 kg)  BMI 24.36 kg/m2 , BMI Body mass index is 24.36 kg/(m^2).  GEN- The patient is elderly appearing, alert and oriented x 3 today.   HEENT: normocephalic, atraumatic; sclera clear, conjunctiva pink; hearing intact; oropharynx clear; neck supple Lungs- Clear to ausculation bilaterally, normal work of breathing.  No wheezes, rales, rhonchi Heart- Regular rate and rhythm  (paced) GI- soft, non-tender, non-distended, bowel sounds present Extremities- no clubbing, cyanosis, or edema; DP/PT/radial pulses 2+ bilaterally MS- no significant deformity or atrophy Skin- warm and dry, no rash or lesion; ICD pocket well healed Psych- euthymic mood, full affect Neuro- strength and sensation are intact  ICD interrogation- reviewed in detail today,  See PACEART report  EKG:  EKG is ordered today. The ekg ordered today shows atrial pacing   Recent Labs: 08/06/2014: Platelets 149.0* 12/14/2014: TSH  1.64 03/25/2015: Hemoglobin 13.2* 04/12/2015: ALT 13; BUN 24; Creat 1.02; Potassium 4.2; Sodium 138   Wt Readings from Last 3 Encounters:  06/23/15 155 lb 9.6 oz (70.58 kg)  04/15/15 160 lb 1.9 oz (72.63 kg)  04/12/15 157 lb (71.215 kg)     Other studies Reviewed: Additional studies/ records that were reviewed today include: Dr Caryl Comes and Dr Sherryl Barters office notes  Assessment and Plan:  1.  Chronic systolic dysfunction euvolemic today Stable on an appropriate medical regimen Normal ICD function See Pace Art report No changes today Estimated longevity 5 months - briefly discussed with patient today   2.  Persistent atrial fibrillation Burden by device interrogation today 96% since last interrogation, but patient has reverted to SR Continue Pradaxa for CHADS2VASC of 4  3.  CAD  No recent ischemic symptoms Continue medical therapy  4.  Aortic stenosis s/p TAVR Per Dr Angelena Form   Current medicines are reviewed at length with the patient today.   The patient does not have concerns regarding his medicines.  The following changes were made today:  none  Labs/ tests ordered today include:  Orders Placed This Encounter  Procedures  . EKG 12-Lead     Disposition:   Follow up with [facility] Dr Caryl Comes 1 year     Signed, Chanetta Marshall, NP 06/23/2015 10:30 AM  [facility] [place] [id number] [phone]  (office) [phone] (fax

## 2015-06-23 ENCOUNTER — Encounter: Payer: Self-pay | Admitting: Internal Medicine

## 2015-06-23 ENCOUNTER — Encounter: Payer: Self-pay | Admitting: Nurse Practitioner

## 2015-06-23 ENCOUNTER — Ambulatory Visit (INDEPENDENT_AMBULATORY_CARE_PROVIDER_SITE_OTHER): Payer: Medicare Other | Admitting: Nurse Practitioner

## 2015-06-23 VITALS — BP 110/60 | HR 62 | Ht 67.0 in | Wt 155.6 lb

## 2015-06-23 DIAGNOSIS — I5022 Chronic systolic (congestive) heart failure: Secondary | ICD-10-CM | POA: Diagnosis not present

## 2015-06-23 DIAGNOSIS — I255 Ischemic cardiomyopathy: Secondary | ICD-10-CM

## 2015-06-23 DIAGNOSIS — I481 Persistent atrial fibrillation: Secondary | ICD-10-CM | POA: Diagnosis not present

## 2015-06-23 DIAGNOSIS — Z952 Presence of prosthetic heart valve: Secondary | ICD-10-CM

## 2015-06-23 DIAGNOSIS — Z954 Presence of other heart-valve replacement: Secondary | ICD-10-CM

## 2015-06-23 DIAGNOSIS — I4819 Other persistent atrial fibrillation: Secondary | ICD-10-CM

## 2015-06-23 LAB — CUP PACEART INCLINIC DEVICE CHECK
Implantable Lead Implant Date: 20110601
Implantable Lead Location: 753860
Implantable Lead Model: 5076
Implantable Lead Model: 6947
MDC IDC LEAD IMPLANT DT: 20110601
MDC IDC LEAD LOCATION: 753859
MDC IDC SESS DTM: 20170316130213

## 2015-06-23 NOTE — Patient Instructions (Addendum)
Medication Instructions:   Your physician recommends that you continue on your current medications as directed. Please refer to the Current Medication list given to you today.   If you need a refill on your cardiac medications before your next appointment, please call your pharmacy.  Labwork:  NONE ORDER TODAY    Testing/Procedures:  NONE ORDER TODAY    Follow-Up:  Your physician wants you to follow-up in: [place] .Marland KitchenYou will receive a reminder letter in the mail two months in advance. If you don't receive a letter, please call our office to schedule the follow-up appointment.  Remote monitoring is used to monitor your Pacemaker of ICD from home. This monitoring reduces the number of office visits required to check your device to one time per year. It allows [place] to keep an eye on the functioning of your device to ensure it is working properly. You are scheduled for a device check from home on 09/26/15..You may send your transmission at any time that day. If you have a wireless device, the transmission will be sent automatically. After your physician reviews your transmission, you will receive a postcard with your next transmission date.     Any Other Special Instructions Will Be Listed Below (If Applicable).

## 2015-07-04 ENCOUNTER — Other Ambulatory Visit: Payer: Self-pay | Admitting: Cardiovascular Disease

## 2015-07-04 ENCOUNTER — Other Ambulatory Visit: Payer: Self-pay | Admitting: Cardiology

## 2015-07-06 ENCOUNTER — Encounter: Payer: Self-pay | Admitting: Cardiology

## 2015-07-06 ENCOUNTER — Other Ambulatory Visit: Payer: Self-pay | Admitting: *Deleted

## 2015-07-06 MED ORDER — ROSUVASTATIN CALCIUM 20 MG PO TABS: 20.0000 mg | ORAL_TABLET | Freq: Every day | ORAL | Status: DC

## 2015-07-25 DIAGNOSIS — L821 Other seborrheic keratosis: Secondary | ICD-10-CM | POA: Diagnosis not present

## 2015-07-25 DIAGNOSIS — L570 Actinic keratosis: Secondary | ICD-10-CM | POA: Diagnosis not present

## 2015-07-25 DIAGNOSIS — D485 Neoplasm of uncertain behavior of skin: Secondary | ICD-10-CM | POA: Diagnosis not present

## 2015-07-25 DIAGNOSIS — Z85828 Personal history of other malignant neoplasm of skin: Secondary | ICD-10-CM | POA: Diagnosis not present

## 2015-07-25 DIAGNOSIS — D225 Melanocytic nevi of trunk: Secondary | ICD-10-CM | POA: Diagnosis not present

## 2015-07-25 DIAGNOSIS — L814 Other melanin hyperpigmentation: Secondary | ICD-10-CM | POA: Diagnosis not present

## 2015-07-25 DIAGNOSIS — D0471 Carcinoma in situ of skin of right lower limb, including hip: Secondary | ICD-10-CM | POA: Diagnosis not present

## 2015-07-26 ENCOUNTER — Other Ambulatory Visit: Payer: Self-pay

## 2015-07-26 MED ORDER — METOPROLOL TARTRATE 50 MG PO TABS: ORAL_TABLET | ORAL | Status: DC

## 2015-07-26 NOTE — Telephone Encounter (Signed)
Carlos Berthold, NP at 06/22/2015 10:13 AM  metoprolol (LOPRESSOR) 50 MG tabletTAKE 1/2 TABLET (25 MG) BY MOUTH EVERY EVENING   Current medicines are reviewed at length with the patient today.  The patient does not have concerns regarding his medicines. The following changes were made today: none  Patient Instructions     Medication Instructions:   Your physician recommends that you continue on your current medications as directed. Please refer to the Current Medication list given to you today.

## 2015-07-28 ENCOUNTER — Other Ambulatory Visit: Payer: Self-pay | Admitting: *Deleted

## 2015-07-28 MED ORDER — ISOSORBIDE MONONITRATE ER 30 MG PO TB24: ORAL_TABLET | ORAL | Status: DC

## 2015-07-29 ENCOUNTER — Telehealth: Payer: Self-pay | Admitting: Cardiovascular Disease

## 2015-07-29 ENCOUNTER — Encounter: Payer: Self-pay | Admitting: Internal Medicine

## 2015-07-29 ENCOUNTER — Ambulatory Visit (INDEPENDENT_AMBULATORY_CARE_PROVIDER_SITE_OTHER): Payer: Medicare Other | Admitting: Internal Medicine

## 2015-07-29 VITALS — BP 124/80 | HR 75 | Temp 98.1°F | Resp 16 | Ht 66.5 in | Wt 153.0 lb

## 2015-07-29 DIAGNOSIS — F419 Anxiety disorder, unspecified: Secondary | ICD-10-CM

## 2015-07-29 DIAGNOSIS — I119 Hypertensive heart disease without heart failure: Secondary | ICD-10-CM

## 2015-07-29 DIAGNOSIS — E7800 Pure hypercholesterolemia, unspecified: Secondary | ICD-10-CM

## 2015-07-29 DIAGNOSIS — I255 Ischemic cardiomyopathy: Secondary | ICD-10-CM | POA: Diagnosis not present

## 2015-07-29 DIAGNOSIS — G2581 Restless legs syndrome: Secondary | ICD-10-CM | POA: Insufficient documentation

## 2015-07-29 DIAGNOSIS — G479 Sleep disorder, unspecified: Secondary | ICD-10-CM | POA: Diagnosis not present

## 2015-07-29 DIAGNOSIS — I481 Persistent atrial fibrillation: Secondary | ICD-10-CM

## 2015-07-29 DIAGNOSIS — I4819 Other persistent atrial fibrillation: Secondary | ICD-10-CM

## 2015-07-29 MED ORDER — DIAZEPAM 5 MG PO TABS: 5.0000 mg | ORAL_TABLET | Freq: Every day | ORAL | Status: DC | PRN

## 2015-07-29 NOTE — Patient Instructions (Addendum)
  It was nice to meet you.   No immunizations administered today.   Medications reviewed and updated.  No changes recommended at this time.  Your prescription(s) have been submitted to your pharmacy. Please take as directed and contact our office if you believe you are having problem(s) with the medication(s).   Please followup in one year

## 2015-07-29 NOTE — Progress Notes (Signed)
Pre visit review using our clinic review tool, if applicable. No additional management support is needed unless otherwise documented below in the visit note. 

## 2015-07-29 NOTE — Assessment & Plan Note (Addendum)
Uses valium only as needed when he wakes up in the middle of the night and can't get back to sleep - does not use nightly Discussed concerns of medication - increased risk of falls, memory concerns, concerns that the medication may not be out of his system in the morning Refilled today

## 2015-07-29 NOTE — Telephone Encounter (Signed)
I returned call to pharmacy and they stated medication has been refilled by primary care.

## 2015-07-29 NOTE — Assessment & Plan Note (Signed)
In sinus today Rate controlled On pradaxa, asa 81mg , metoprolol Continue current meds

## 2015-07-29 NOTE — Telephone Encounter (Signed)
New message       *STAT* If patient is at the pharmacy, call can be transferred to refill team.   1. Which medications need to be refilled? (please list name of each medication and dose if known) diazepam 5mg  2. Which pharmacy/location (including street and city if local pharmacy) is medication to be sent to?[facility] 3. Do they need a 30 day or 90 day supply? 30 day

## 2015-07-29 NOTE — Assessment & Plan Note (Addendum)
BP well controlled Current regimen effective and well tolerated Continue current medications at current doses cmp done earlier this year

## 2015-07-29 NOTE — Assessment & Plan Note (Signed)
On crestor 20 mg daily Lipid well controlled in January Continue current dose of crestor

## 2015-07-29 NOTE — Progress Notes (Signed)
Subjective:    Patient ID: [name], male    DOB: Oct 26, 1933, 82 y.o.   MRN: [id number]  HPI He is here to establish with a new pcp.     CAD, CHF, AFib, Hypertension: He is taking his medication daily. He is compliant with a low sodium diet.  He denies chest pain, palpitations, edema, shortness of breath and regular headaches. He is exercising regularly - golf twice a week, rides a cart.  He does monitor his blood pressure at home on occasion.    Hyperlipidemia: He is taking his medication daily. He is compliant with a low fat/cholesterol diet. He is exercising regularly, but minimally.    Difficulty sleeping: He sometimes wakes up in the middle of the night and is unable to get back to sleep.  He takes valium in those situations and does not take it night.  He denies side effects.    Medications and allergies reviewed with patient and updated if appropriate.  Patient Active Problem List   Diagnosis Date Noted  . Erectile disorder due to medical condition in male patient 12/17/2014  . Severe aortic valve stenosis - s/p  Edwards Sapien 3 THV (size 26 mm, model # 9600TFX, serial # [id number]) 03/23/2014 03/23/2014  . Abnormal nuclear stress test 01/29/2014  . Ischemic cardiomyopathy 05-Jan-2012  . Sudden cardiac death-aborted 10-29-10  . Back pain 07/10/2010  . Hypercholesteremia 07/10/2010  . Benign hypertensive heart disease without heart failure 07/10/2010  . S/P CABG (coronary artery bypass graft) 07/10/2010  . ICD-Medtronic 03/30/2010  . Atrial fibrillation ([place]) 11-28-202011    Current Outpatient Prescriptions on File Prior to Visit  Medication Sig Dispense Refill  . amLODipine (NORVASC) 5 MG tablet Take 1 tablet by mouth daily.    Marland Kitchen aspirin 81 MG tablet Take 81 mg by mouth daily.      . Coenzyme Q10 (CO Q 10 PO) Take 1 tablet by mouth daily.     . dabigatran (PRADAXA) 150 MG CAPS capsule Take 1 capsule (150 mg total) by mouth 2 (two) times daily. 60 capsule 6  .  diazepam (VALIUM) 5 MG tablet Take 1 tablet (5 mg total) by mouth daily as needed for anxiety. 30 tablet 5  . furosemide (LASIX) 40 MG tablet TAKE 1 TABLET BY MOUTH DAILY. 90 tablet 2  . isosorbide mononitrate (IMDUR) 30 MG 24 hr tablet TAKE 1/2 TABLET BY MOUTH DAILY 15 tablet 8  . KRILL OIL PO Take 1 tablet by mouth daily.    Marland Kitchen latanoprost (XALATAN) 0.005 % ophthalmic solution Place 1 drop into both eyes at bedtime.     Marland Kitchen lisinopril (PRINIVIL,ZESTRIL) 20 MG tablet Take 1 tablet (20 mg total) by mouth daily. 90 tablet 1  . Magnesium Oxide (MAG-OXIDE PO) Take 1 tablet by mouth daily.    . metoprolol (LOPRESSOR) 100 MG tablet TAKE 1/2 TABLET BY MOUTH EVERY MORNING. 45 tablet 1  . metoprolol (LOPRESSOR) 50 MG tablet TAKE 1/2 TABLET (25 MG) BY MOUTH EVERY EVENING. 45 tablet 0  . Multiple Vitamin (MULTIVITAMIN) tablet Take 1 tablet by mouth daily.      . niacin (SLO-NIACIN) 500 MG tablet Take 500 mg by mouth at bedtime.      . nitroGLYCERIN (NITROSTAT) 0.4 MG SL tablet Place 1 tablet (0.4 mg total) under the tongue every 5 (five) minutes as needed for chest pain (for chest pain (MAX of 3 doses)). 25 tablet 6  . potassium chloride (K-DUR,KLOR-CON) 10 MEQ tablet Take 10 mEq  by mouth 2 (two) times daily.      . rosuvastatin (CRESTOR) 20 MG tablet Take 1 tablet (20 mg total) by mouth daily. 90 tablet 3  . Thiamine HCl (VITAMIN B-1) 100 MG tablet Take 100 mg by mouth daily.       No current facility-administered medications on file prior to visit.    Past Medical History  Diagnosis Date  . Cardiac arrest [facility])     aborted, atrial fibrillation/flutter  . Presence of automatic cardioverter/defibrillator (AICD)     Medtronic, remote-yes.   . Coronary artery disease   . Aortic stenosis, severe     s/p TAVR with Edwards Sapien 3 THV (size 26 mm, model # 9600TFX, serial # [id number])  . Peripheral vascular disease ([place])   . Essential hypertension   . Hyperlipidemia   . Angina pectoris      Recurrent  angina pectoris  . Claudication ([place])   . Myocardial infarction ([place])   . Heart murmur   . Kidney stones   . History of hiatal hernia   . Headache   . Arthritis     Past Surgical History  Procedure Laterality Date  . Coronary artery bypass graft      in 1982 and '84 with subsequent PTCI  . Appendectomy    . Tonsillectomy    . Adenoidectomy    . Cataract surgery       Bilateral cataract surgery  . Left elbow tendon surgery    . Cardiac catheterization   11/16/2009   . Left and right heart catheterization with coronary/graft angiogram N/A 02/01/2014    Procedure: LEFT AND RIGHT HEART CATHETERIZATION WITH Beatrix Fetters;  Surgeon: Sinclair Grooms, MD;  Location: [facility] CATH LAB;  Service: Cardiovascular;  Laterality: N/A;  . Tonsillectomy    . Hernia repair    . Cardiac defibrillator placement    . Transcatheter aortic valve replacement, transfemoral N/A 03/23/2014    Procedure: TRANSCATHETER AORTIC VALVE REPLACEMENT, TRANSFEMORAL;  Surgeon: Burnell Blanks, MD;  Location: [place];  Service: Open Heart Surgery;  Laterality: N/A;  . Tee without cardioversion N/A 03/23/2014    Procedure: TRANSESOPHAGEAL ECHOCARDIOGRAM (TEE);  Surgeon: Burnell Blanks, MD;  Location: [place];  Service: Open Heart Surgery;  Laterality: N/A;    Social History   Social History  . Marital Status: Married    Spouse Name: N/A  . Number of Children: 3  . Years of Education: N/A   Occupational History  . Retired    Social History Main Topics  . Smoking status: Former Smoker    Quit date: 10/25/1952  . Smokeless tobacco: Never Used  . Alcohol Use: 3.0 - 3.6 oz/week    5-6 Glasses of wine per week     Comment: 5-6 times a week  . Drug Use: No  . Sexual Activity: Not on file   Other Topics Concern  . Not on file   Social History Narrative    Family History  Problem Relation Age of Onset  . Coronary artery disease Other     family history is dignificant for early CAD in  several members  . Heart attack Father 50    Review of Systems  Constitutional: Negative for appetite change.  Respiratory: Negative for cough, shortness of breath and wheezing.   Cardiovascular: Negative for chest pain, palpitations and leg swelling.  Neurological: Negative for dizziness, light-headedness and headaches.       Objective:   Filed Vitals:   07/29/15  0922  BP: 124/80  Pulse: 75  Temp: 98.1 F (36.7 C)  Resp: 16   Filed Weights   07/29/15 0922  Weight: 153 lb (69.4 kg)   Body mass index is 24.33 kg/(m^2).   Physical Exam Constitutional: Appears well-developed and well-nourished. No distress.  Neck: Neck supple. No tracheal deviation present. No thyromegaly present.  No carotid bruit. No cervical adenopathy.   Cardiovascular: Normal rate, regular rhythm and normal heart sounds.   2/6 systolic murmur heard.  No edema Pulmonary/Chest: Effort normal and breath sounds normal. No respiratory distress. No wheezes.        Assessment & Plan:   See Problem List for Assessment and Plan of chronic medical problems.

## 2015-08-12 ENCOUNTER — Telehealth: Payer: Self-pay | Admitting: Internal Medicine

## 2015-08-12 NOTE — Telephone Encounter (Signed)
Pt c/o medication issue:  1. Name of Medication:Pradaxa 150mg   2. How are you currently taking this medication (dosage and times per day)?150mg  twice a day one in the morning and one in the evening   3. Are you having a reaction (difficulty breathing--STAT)? No  4. What is your medication issue? Bruising Easily

## 2015-08-12 NOTE — Telephone Encounter (Signed)
Will forward to pharm md, ? Okay to stop aspirin?

## 2015-08-12 NOTE — Telephone Encounter (Signed)
Spoke with pt and gave him information from Dr. McAlhany 

## 2015-08-12 NOTE — Telephone Encounter (Signed)
Please route to MD, this will be their decision to make.

## 2015-08-12 NOTE — Telephone Encounter (Signed)
Spoke with pt, he was changed from plavix to pradaxa. He hit his arm and developed a silver dollar size bruise that has taken some time to start going away. Explained the difference between the plavix and pradaxa. Will discuss with dr Angelena Form if would be okay to stop the aspirin.  Pt agreed with this plan.

## 2015-08-12 NOTE — Telephone Encounter (Signed)
OK to stop ASA,. cdm

## 2015-08-24 ENCOUNTER — Telehealth: Payer: Self-pay | Admitting: Internal Medicine

## 2015-08-24 NOTE — Telephone Encounter (Signed)
[facility] [place] [facility]  Patient Name: [name]  DOB: May 10, 1933    Initial Comment Caller came down with a terrible sore throat last night    Nurse Assessment  Nurse: Wayne Sever, RN, Tillie Rung Date/Time (Eastern Time): 08/24/2015 10:24:57 AM  Confirm and document reason for call. If symptomatic, describe symptoms. You must click the next button to save text entered. ---Caller states he came down with a terrible sore throat. He denies any fever. He denies any cold symptoms.  Has the patient traveled out of the country within the last 30 days? ---Not Applicable  Does the patient have any new or worsening symptoms? ---Yes  Will a triage be completed? ---Yes  Related visit to physician within the last 2 weeks? ---No  Does the PT have any chronic conditions? (i.e. diabetes, asthma, etc.) ---Yes  List chronic conditions. ---Heart Disease, HTN  Is this a behavioral health or substance abuse call? ---No     Guidelines    Guideline Title Affirmed Question Affirmed Notes  Sore Throat SEVERE (e.g., excruciating) throat pain    Final Disposition User   See Physician within 24 Hours [place], RN, Tillie Rung    Comments  Scheduled for 05/18 with Dr Jenny Reichmann at 1115   Referrals  REFERRED TO PCP OFFICE   Disagree/Comply: Comply

## 2015-08-25 ENCOUNTER — Ambulatory Visit (INDEPENDENT_AMBULATORY_CARE_PROVIDER_SITE_OTHER): Payer: Medicare Other | Admitting: Internal Medicine

## 2015-08-25 ENCOUNTER — Encounter: Payer: Self-pay | Admitting: Internal Medicine

## 2015-08-25 VITALS — BP 138/76 | HR 84 | Temp 99.1°F | Resp 20 | Wt 155.0 lb

## 2015-08-25 DIAGNOSIS — J029 Acute pharyngitis, unspecified: Secondary | ICD-10-CM | POA: Insufficient documentation

## 2015-08-25 DIAGNOSIS — I255 Ischemic cardiomyopathy: Secondary | ICD-10-CM

## 2015-08-25 MED ORDER — AZITHROMYCIN 250 MG PO TABS: ORAL_TABLET | ORAL | Status: DC

## 2015-08-25 NOTE — Patient Instructions (Signed)
Please take all new medication as prescribed - the antibiotic  Please continue all other medications as before, and refills have been done if requested.  Please have the pharmacy call with any other refills you may need.  Please keep your appointments with your specialists as you may have planned   

## 2015-08-25 NOTE — Progress Notes (Signed)
Pre visit review using our clinic review tool, if applicable. No additional management support is needed unless otherwise documented below in the visit note. 

## 2015-08-25 NOTE — Progress Notes (Signed)
Subjective:    Patient ID: [name], male    DOB: [birth year], 82 y.o.   MRN: [id number]  HPI  Here with severe onset ST with fever for 2 days, feels terrible, little to no congestion, no other sick at home or others similar, No significant cough, Pt denies chest pain, increased sob or doe, wheezing, orthopnea, PND, increased LE swelling, palpitations, dizziness or syncope.  Pt denies new neurological symptoms such as new headache, or facial or extremity weakness or numbness  Has hx of CAP feb 2017, also leaving [place] in 2 wks Past Medical History  Diagnosis Date  . Cardiac arrest [facility])     aborted, atrial fibrillation/flutter  . Presence of automatic cardioverter/defibrillator (AICD)     [employer], remote-yes.   . Coronary artery disease   . Aortic stenosis, severe     s/p TAVR with Edwards Sapien 3 THV (size 26 mm, model # 9600TFX, serial # [id number])  . Peripheral vascular disease ([place])   . Essential hypertension   . Hyperlipidemia   . Angina pectoris      Recurrent angina pectoris  . Claudication ([place])   . Myocardial infarction ([place])   . Heart murmur   . Kidney stones   . History of hiatal hernia   . Headache   . Arthritis    Past Surgical History  Procedure Laterality Date  . Coronary artery bypass graft      in 1982 and '84 with subsequent PTCI  . Appendectomy    . Tonsillectomy    . Adenoidectomy    . Cataract surgery       Bilateral cataract surgery  . Left elbow tendon surgery    . Cardiac catheterization   11/16/2009   . Left and right heart catheterization with coronary/graft angiogram N/A 02/01/2014    Procedure: LEFT AND RIGHT HEART CATHETERIZATION WITH Beatrix Fetters;  Surgeon: Sinclair Grooms, MD;  Location: [facility];  Service: Cardiovascular;  Laterality: N/A;  . Tonsillectomy    . Hernia repair    . Cardiac defibrillator placement    . Transcatheter aortic valve replacement, transfemoral N/A 03/23/2014   Procedure: TRANSCATHETER AORTIC VALVE REPLACEMENT, TRANSFEMORAL;  Surgeon: Burnell Blanks, MD;  Location: [place];  Service: Open Heart Surgery;  Laterality: N/A;  . Tee without cardioversion N/A 03/23/2014    Procedure: TRANSESOPHAGEAL ECHOCARDIOGRAM (TEE);  Surgeon: Burnell Blanks, MD;  Location: [place];  Service: Open Heart Surgery;  Laterality: N/A;    reports that he quit smoking about 62 years ago. He has never used smokeless tobacco. He reports that he drinks about 3.0 - 3.6 oz of alcohol per week. He reports that he does not use illicit drugs. family history includes Coronary artery disease in his other; Heart attack (age of onset: 92) in his father. Allergies  Allergen Reactions  . Cyclobenzaprine Hcl Other (See Comments)    Unspecified reaction pt doesn't know what reaction  . Tetracycline     Rash    Current Outpatient Prescriptions on File Prior to Visit  Medication Sig Dispense Refill  . amLODipine (NORVASC) 5 MG tablet Take 1 tablet by mouth daily.    . Coenzyme Q10 (CO Q 10 PO) Take 1 tablet by mouth daily.     . dabigatran (PRADAXA) 150 MG CAPS capsule Take 1 capsule (150 mg total) by mouth 2 (two) times daily. 60 capsule 6  . diazepam (VALIUM) 5 MG tablet Take 1 tablet (5 mg  total) by mouth daily as needed for anxiety. 30 tablet 1  . furosemide (LASIX) 40 MG tablet TAKE 1 TABLET BY MOUTH DAILY. 90 tablet 2  . isosorbide mononitrate (IMDUR) 30 MG 24 hr tablet TAKE 1/2 TABLET BY MOUTH DAILY 15 tablet 8  . KRILL OIL PO Take 1 tablet by mouth daily.    Marland Kitchen latanoprost (XALATAN) 0.005 % ophthalmic solution Place 1 drop into both eyes at bedtime.     Marland Kitchen lisinopril (PRINIVIL,ZESTRIL) 20 MG tablet Take 1 tablet (20 mg total) by mouth daily. 90 tablet 1  . Magnesium Oxide (MAG-OXIDE PO) Take 1 tablet by mouth daily.    . metoprolol (LOPRESSOR) 100 MG tablet TAKE 1/2 TABLET BY MOUTH EVERY MORNING. 45 tablet 1  . metoprolol (LOPRESSOR) 50 MG tablet TAKE 1/2 TABLET (25 MG)  BY MOUTH EVERY EVENING. 45 tablet 0  . Multiple Vitamin (MULTIVITAMIN) tablet Take 1 tablet by mouth daily.      . niacin (SLO-NIACIN) 500 MG tablet Take 500 mg by mouth at bedtime.      . nitroGLYCERIN (NITROSTAT) 0.4 MG SL tablet Place 1 tablet (0.4 mg total) under the tongue every 5 (five) minutes as needed for chest pain (for chest pain (MAX of 3 doses)). 25 tablet 6  . potassium chloride (K-DUR,KLOR-CON) 10 MEQ tablet Take 10 mEq by mouth 2 (two) times daily.      . rosuvastatin (CRESTOR) 20 MG tablet Take 1 tablet (20 mg total) by mouth daily. 90 tablet 3  . Thiamine HCl (VITAMIN B-1) 100 MG tablet Take 100 mg by mouth daily.       No current facility-administered medications on file prior to visit.   Review of Systems  Constitutional: Negative for unusual diaphoresis or night sweats HENT: Negative for ear swelling or discharge Eyes: Negative for worsening visual haziness  Respiratory: Negative for choking and stridor.   Gastrointestinal: Negative for distension or worsening eructation Genitourinary: Negative for retention or change in urine volume.  Musculoskeletal: Negative for other MSK pain or swelling Skin: Negative for color change and worsening wound Neurological: Negative for tremors and numbness other than noted  Psychiatric/Behavioral: Negative for decreased concentration or agitation other than above       Objective:   Physical Exam BP 138/76 mmHg  Pulse 84  Temp(Src) 99.1 F (37.3 C) (Oral)  Resp 20  Wt 155 lb (70.308 kg)  SpO2 94% VS noted, mild ill Constitutional: Pt appears in no apparent distress HENT: Head: NCAT.  Right Ear: External ear normal.  Left Ear: External ear normal.  Bilat tm's with mild erythema.  Max sinus areas non tender.  Pharynx with severe erythema, with small exudate Eyes: . Pupils are equal, round, and reactive to light. Conjunctivae and EOM are normal Neck: Normal range of motion. Neck supple.  Cardiovascular: Normal rate and regular  rhythm.   Pulmonary/Chest: Effort normal and breath sounds without rales or wheezing.  Neurological: Pt is alert. Not confused , motor grossly intact Skin: Skin is warm. No rash, no LE edema Psychiatric: Pt behavior is normal. No agitation.      Assessment & Plan:

## 2015-08-25 NOTE — Assessment & Plan Note (Signed)
Mild to mod, for antibx course,  to f/u any worsening symptoms or concerns 

## 2015-09-02 ENCOUNTER — Ambulatory Visit: Payer: Medicare Other | Admitting: Internal Medicine

## 2015-09-26 ENCOUNTER — Telehealth: Payer: Self-pay | Admitting: Cardiology

## 2015-09-26 ENCOUNTER — Encounter: Payer: Medicare Other | Admitting: *Deleted

## 2015-09-26 NOTE — Telephone Encounter (Signed)
Confirmed remote transmission w/ pt wife.   

## 2015-09-30 ENCOUNTER — Encounter: Payer: Self-pay | Admitting: Cardiology

## 2015-10-06 ENCOUNTER — Other Ambulatory Visit: Payer: Self-pay | Admitting: Internal Medicine

## 2015-10-07 ENCOUNTER — Ambulatory Visit (INDEPENDENT_AMBULATORY_CARE_PROVIDER_SITE_OTHER): Payer: Medicare Other | Admitting: *Deleted

## 2015-10-07 DIAGNOSIS — I255 Ischemic cardiomyopathy: Secondary | ICD-10-CM | POA: Diagnosis not present

## 2015-10-11 IMAGING — CR DG CHEST 2V
2 series · 2 of 2 positions shown · non-contrast
Comparison: CT chest 03/11/2014

CLINICAL DATA: Preop aortic stenosis surgery 03/23/2014

EXAM:
CHEST  2 VIEW

[w chest pa]
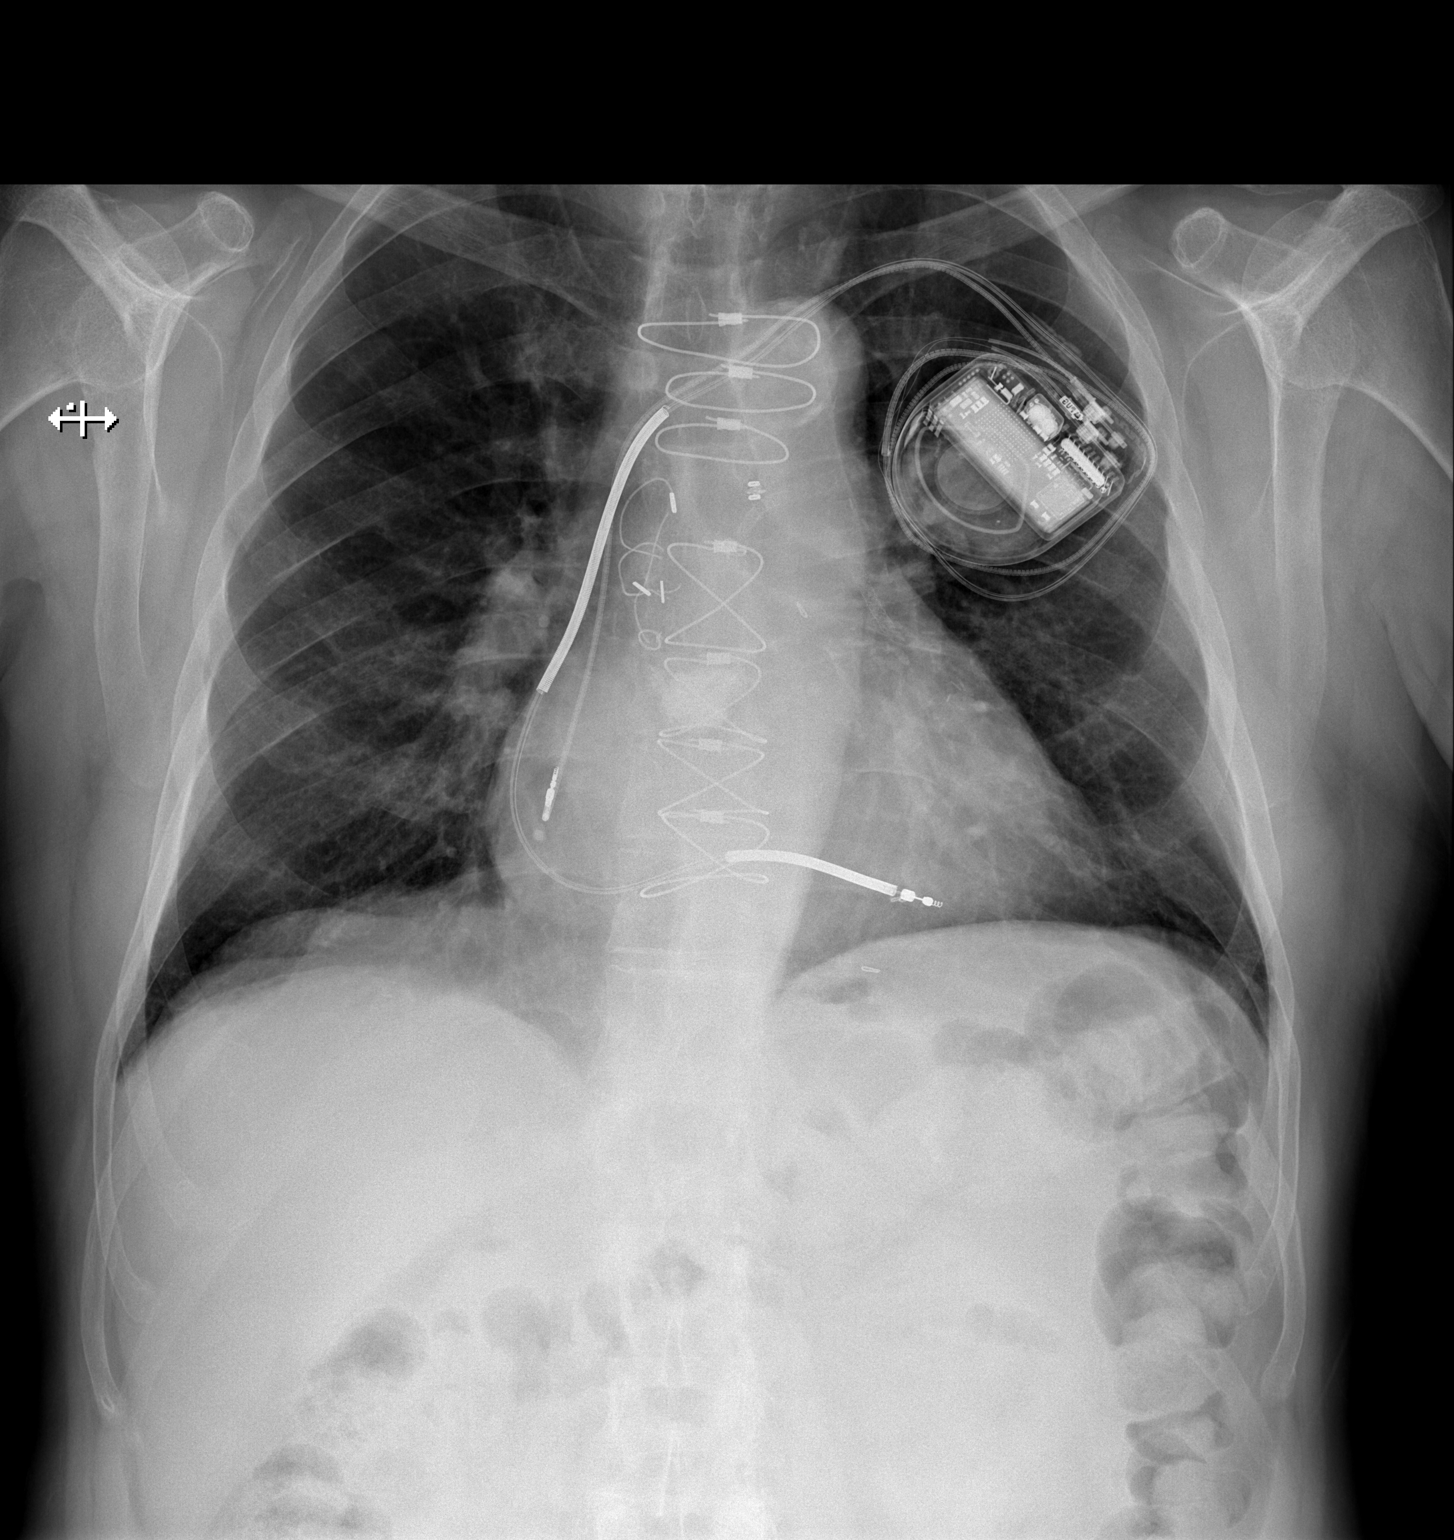

[w chest lat]
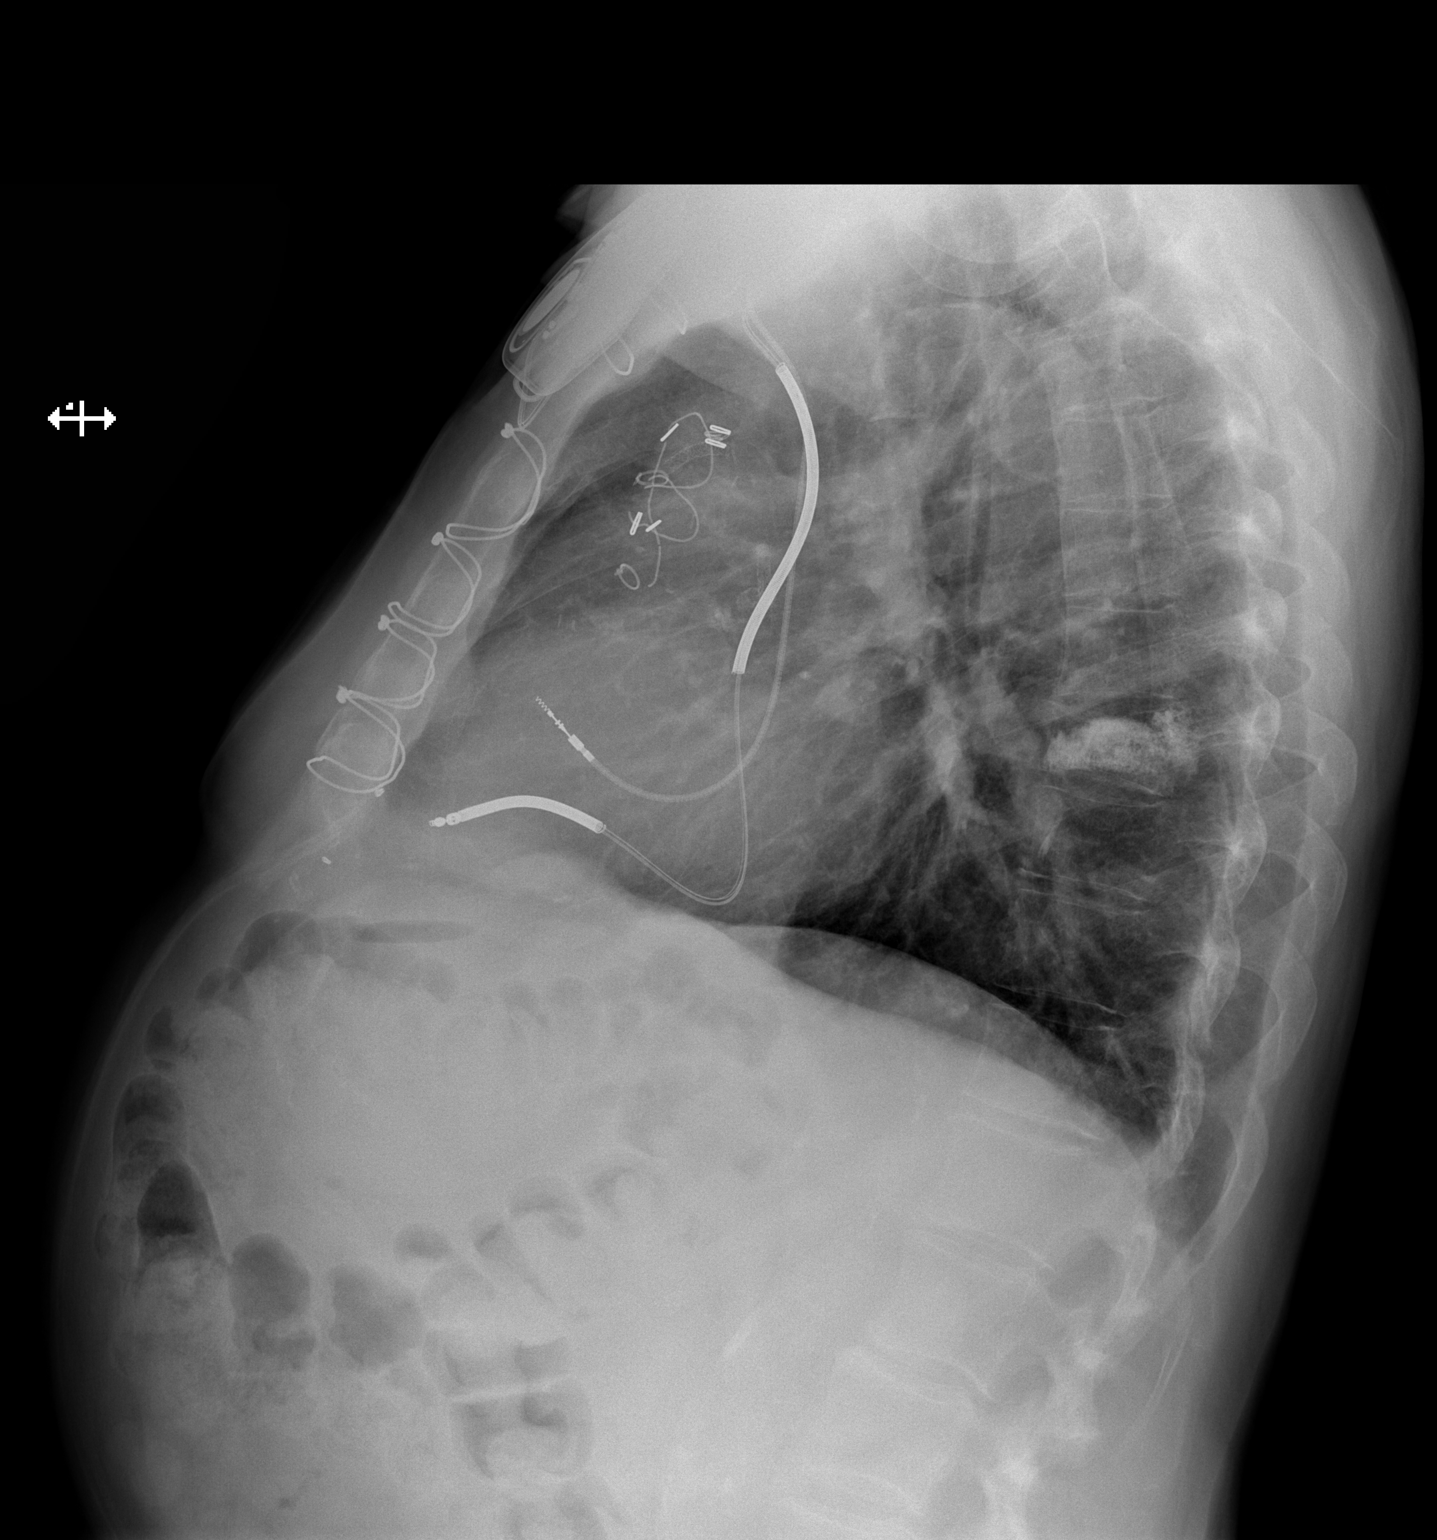

[2 of 2 positions shown; findings below may reference images not displayed]

FINDINGS: There is no focal parenchymal opacity, pleural effusion, or
pneumothorax. The heart and mediastinal contours are unremarkable.
There is a 2 lead cardiac pacer. There is evidence of prior CABG.

There is prior thoracic spine vertebral body augmentation.
IMPRESSION: No active cardiopulmonary disease.

## 2015-10-14 ENCOUNTER — Encounter: Payer: Self-pay | Admitting: Cardiology

## 2015-10-14 NOTE — Progress Notes (Signed)
Remote ICD transmission.   

## 2015-10-15 IMAGING — CR DG CHEST 1V PORT
1 series · 1 of 1 positions shown · non-contrast
Comparison: Chest radiograph 03/19/2014

CLINICAL DATA: Status post TAVR.

EXAM:
PORTABLE CHEST - 1 VIEW

[portable]
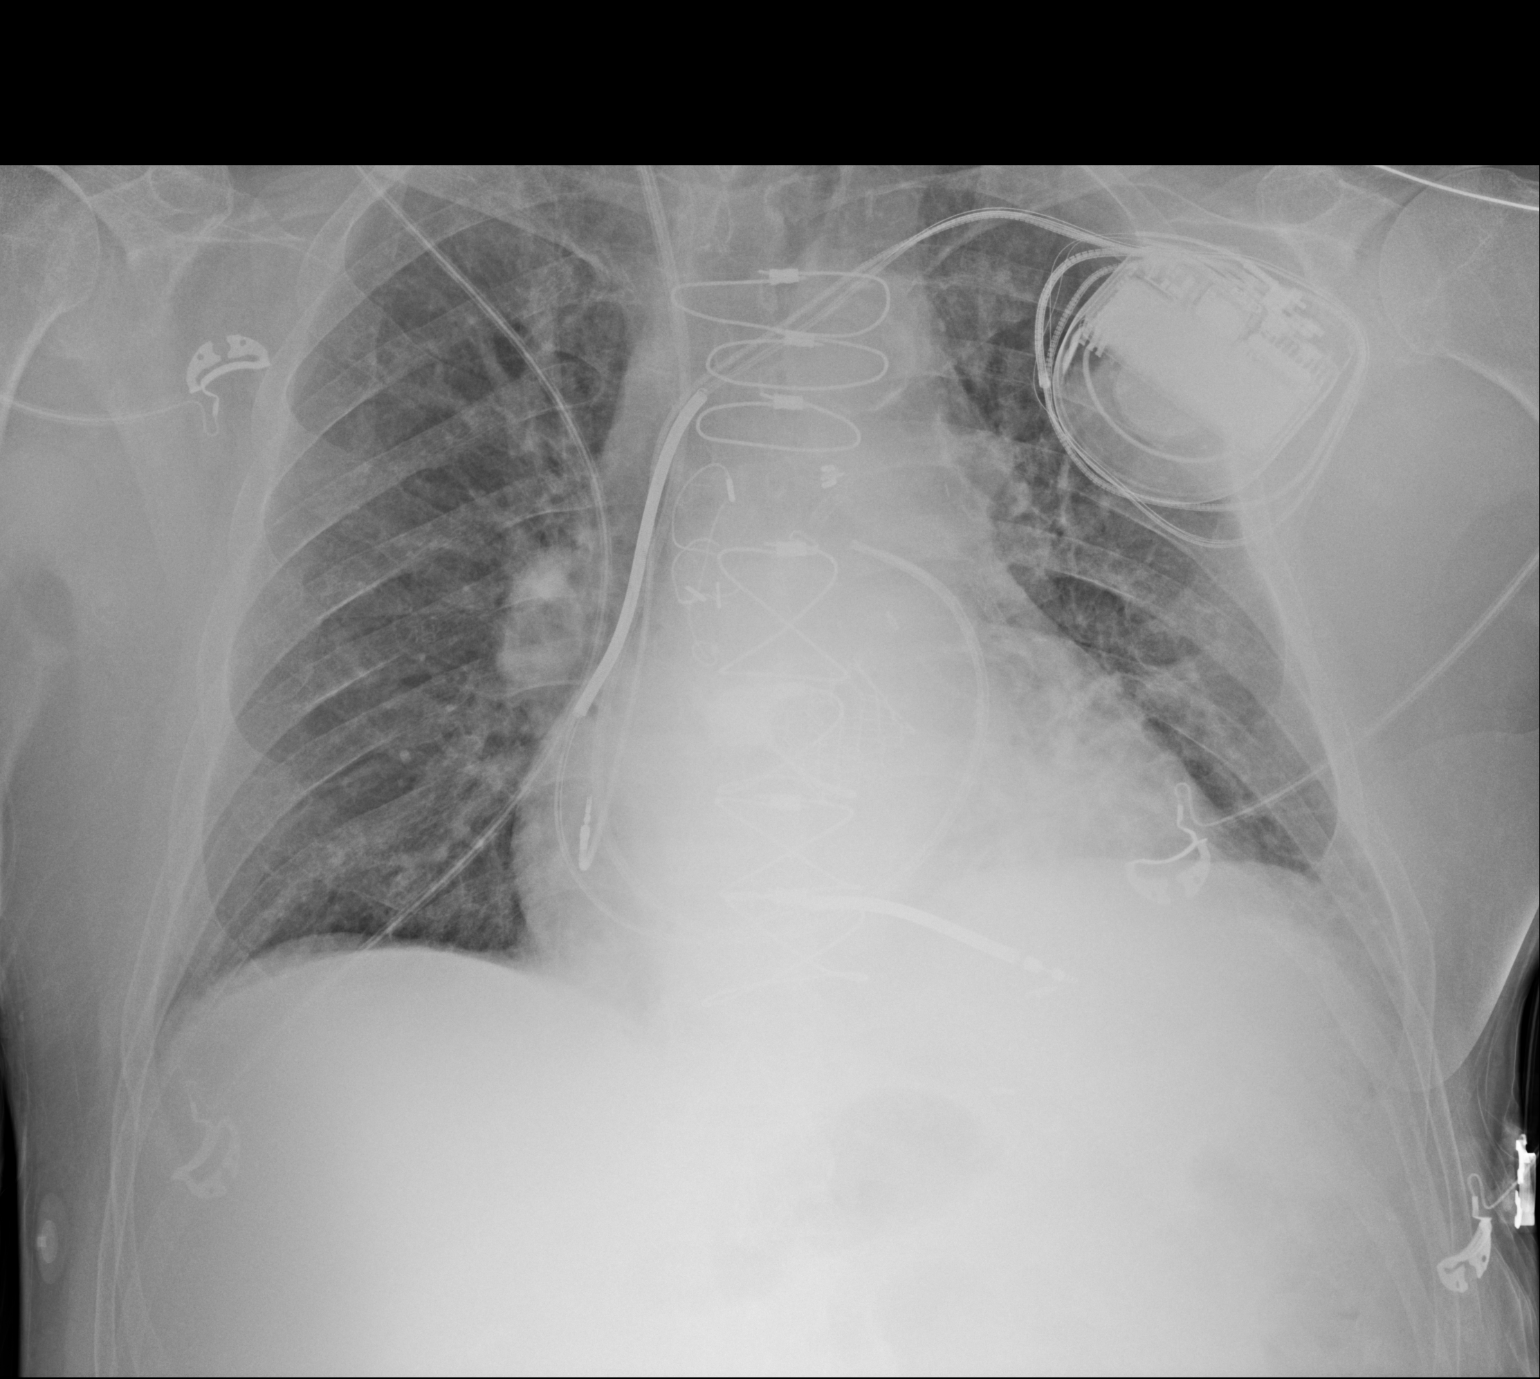

[1 of 1 positions shown; findings below may reference images not displayed]

FINDINGS: Valve projects in the expected location of aortic valve. Multi lead
AICD device overlies the left hemi thorax, leads are stable in
position. Monitoring leads overlie the patient. Interval insertion
of pulmonary arterial catheter with tip projecting over the expected
location of the main pulmonary artery. Status post median
sternotomy. Pulmonary vascular redistribution. No large
consolidative pulmonary opacities. Minimal left basilar atelectasis.
IMPRESSION: Pulmonary arterial catheter projects over the expected location of
the main pulmonary artery.

Left basilar atelectasis.

## 2015-10-18 DIAGNOSIS — H04129 Dry eye syndrome of unspecified lacrimal gland: Secondary | ICD-10-CM | POA: Diagnosis not present

## 2015-10-18 DIAGNOSIS — H401231 Low-tension glaucoma, bilateral, mild stage: Secondary | ICD-10-CM | POA: Diagnosis not present

## 2015-10-18 DIAGNOSIS — Z961 Presence of intraocular lens: Secondary | ICD-10-CM | POA: Diagnosis not present

## 2015-10-18 DIAGNOSIS — H02834 Dermatochalasis of left upper eyelid: Secondary | ICD-10-CM | POA: Diagnosis not present

## 2015-10-20 ENCOUNTER — Telehealth: Payer: Self-pay | Admitting: Cardiovascular Disease

## 2015-10-20 NOTE — Telephone Encounter (Signed)
Spoke with pt. He reports he was in [place] from 6/2-6/19.  Ate on ship and in restaurants. Ankle swelling started while on trip. Has decreased some since return home but some swelling still present. Is in both ankles. Swelling on left is slightly greater than on right.No change in swelling throughout the day.  Since returning home he has gone back to a normal diet. No shortness of breath. Weight 2 days ago was 153.1 lbs and today was 155.6.  Normal weight is around 153 lbs.  Is taking furosemide 40 mg daily.  Will forward to Dr. Angelena Form for review/recommendations.

## 2015-10-20 NOTE — Telephone Encounter (Signed)
New Message  Pt c/o swelling: STAT is pt has developed SOB within 24 hours  1. How long have you been experiencing swelling? Less than a month   2. Where is the swelling located? Both feet- left worse   3.  Are you currently taking a "fluid pill"? Does not know  4.  Are you currently SOB? no  5.  Have you traveled recently? [place] for about 18 days

## 2015-10-21 NOTE — Telephone Encounter (Signed)
Spoke with pt and gave him instructions from Dr. Angelena Form. He reports elevating his legs yesterday evening did help some but he still has some swelling today. He will call us back next week with update or sooner if problems.  He reports no pain in either leg. Swelling is in ankles.

## 2015-10-21 NOTE — Telephone Encounter (Signed)
If there is no significant difference in size of each leg, then DVT is not likely but with recent long airplane flight, I would consider this if swelling does not resolve over the next few days. I would have him take Lasix 40 mg po BID for next three days, elevate legs. Call back next week and let us know how that worked. If one leg begins to swell more than the other or becomes painful, he needs to have venous dopplers to exclude DVT. Thanks, Gerald Stabs

## 2015-10-24 ENCOUNTER — Telehealth: Payer: Self-pay | Admitting: Cardiovascular Disease

## 2015-10-24 LAB — CUP PACEART REMOTE DEVICE CHECK
Brady Statistic AP VS Percent: 41.49 %
Brady Statistic AS VP Percent: 38.96 %
Brady Statistic AS VS Percent: 19.05 %
Brady Statistic RA Percent Paced: 41.99 %
Date Time Interrogation Session: 20170630202040
HighPow Impedance: 37 Ohm
HighPow Impedance: 48 Ohm
Implantable Lead Implant Date: 20110601
Lead Channel Impedance Value: 475 Ohm
Lead Channel Pacing Threshold Amplitude: 1.375 V
Lead Channel Pacing Threshold Pulse Width: 0.4 ms
Lead Channel Sensing Intrinsic Amplitude: 2 mV
Lead Channel Sensing Intrinsic Amplitude: 3.125 mV
Lead Channel Setting Pacing Amplitude: 2 V
Lead Channel Setting Pacing Amplitude: 2.75 V
Lead Channel Setting Pacing Pulse Width: 0.4 ms
MDC IDC LEAD IMPLANT DT: 20110601
MDC IDC LEAD LOCATION: 753859
MDC IDC LEAD LOCATION: 753860
MDC IDC MSMT BATTERY VOLTAGE: 2.63 V
MDC IDC MSMT LEADCHNL RA PACING THRESHOLD AMPLITUDE: 0.875 V
MDC IDC MSMT LEADCHNL RA PACING THRESHOLD PULSEWIDTH: 0.4 ms
MDC IDC MSMT LEADCHNL RA SENSING INTR AMPL: 2 mV
MDC IDC MSMT LEADCHNL RV IMPEDANCE VALUE: 342 Ohm
MDC IDC MSMT LEADCHNL RV SENSING INTR AMPL: 3.125 mV
MDC IDC SET LEADCHNL RV SENSING SENSITIVITY: 0.3 mV
MDC IDC STAT BRADY AP VP PERCENT: 0.51 %
MDC IDC STAT BRADY RV PERCENT PACED: 39.47 %

## 2015-10-24 NOTE — Telephone Encounter (Signed)
Pt calling to update on swelling.  Took Lasix 40mg  BID Fri, Sat and Sun.  Pt states swelling is much improved but feet/ankles still not quite back to normal.  Denies any pain or discomfort.  Pt goes back to taking Lasix 40mg  QD today.  Advised pt to let us know if swelling gets worse or pain develops.  Pt verbalized understanding and was in agreement with this plan.  Will forward to Dr. Angelena Form and his nurse to make them aware.

## 2015-10-24 NOTE — Telephone Encounter (Signed)
New message    Pt c/o medication issue:  1. Name of Medication: lasix  2. How are you currently taking this medication (dosage and times per day)? 40 mg po  3. Are you having a reaction (difficulty breathing--STAT)? no  4. What is your medication issue? The pt was instructed to double the dose and call back to give results on how it work for him by Fraser Din on Friday.

## 2015-10-28 ENCOUNTER — Encounter: Payer: Self-pay | Admitting: Cardiology

## 2015-10-28 NOTE — Telephone Encounter (Signed)
Agree. Thanks

## 2015-11-02 ENCOUNTER — Other Ambulatory Visit: Payer: Medicare Other

## 2015-11-07 ENCOUNTER — Ambulatory Visit: Payer: Medicare Other | Admitting: Cardiovascular Disease

## 2015-11-08 ENCOUNTER — Telehealth: Payer: Self-pay | Admitting: Cardiology

## 2015-11-08 ENCOUNTER — Encounter: Payer: Medicare Other | Admitting: *Deleted

## 2015-11-08 NOTE — Telephone Encounter (Signed)
LMOVM reminding pt to send remote transmission.   

## 2015-11-11 ENCOUNTER — Encounter: Payer: Self-pay | Admitting: Cardiology

## 2015-11-17 ENCOUNTER — Ambulatory Visit (INDEPENDENT_AMBULATORY_CARE_PROVIDER_SITE_OTHER): Payer: Medicare Other | Admitting: Physician Assistant

## 2015-11-17 VITALS — BP 114/50 | HR 75 | Temp 98.2°F | Resp 16 | Ht 65.5 in | Wt 162.0 lb

## 2015-11-17 DIAGNOSIS — R350 Frequency of micturition: Secondary | ICD-10-CM

## 2015-11-17 DIAGNOSIS — N4281 Prostatodynia syndrome: Secondary | ICD-10-CM | POA: Diagnosis not present

## 2015-11-17 DIAGNOSIS — R300 Dysuria: Secondary | ICD-10-CM

## 2015-11-17 DIAGNOSIS — I255 Ischemic cardiomyopathy: Secondary | ICD-10-CM | POA: Diagnosis not present

## 2015-11-17 LAB — POCT CBC
GRANULOCYTE PERCENT: 77.3 % (ref 37–80)
HCT, POC: 36.7 % — AB (ref 43.5–53.7)
Hemoglobin: 13 g/dL — AB (ref 14.1–18.1)
LYMPH, POC: 1 (ref 0.6–3.4)
MCH, POC: 32.7 pg — AB (ref 27–31.2)
MCHC: 35.6 g/dL — AB (ref 31.8–35.4)
MCV: 91.8 fL (ref 80–97)
MID (cbc): 0.6 (ref 0–0.9)
MPV: 8.3 fL (ref 0–99.8)
PLATELET COUNT, POC: 129 10*3/uL — AB (ref 142–424)
POC Granulocyte: 5.2 (ref 2–6.9)
POC LYMPH %: 14.3 % (ref 10–50)
POC MID %: 8.4 %M (ref 0–12)
RBC: 4 M/uL — AB (ref 4.69–6.13)
RDW, POC: 14 %
WBC: 6.7 10*3/uL (ref 4.6–10.2)

## 2015-11-17 LAB — POCT URINALYSIS DIP (MANUAL ENTRY)
BILIRUBIN UA: NEGATIVE
BILIRUBIN UA: NEGATIVE
Glucose, UA: NEGATIVE
Nitrite, UA: NEGATIVE
Protein Ur, POC: NEGATIVE
SPEC GRAV UA: 1.02
Urobilinogen, UA: 0.2
pH, UA: 6.5

## 2015-11-17 LAB — POC MICROSCOPIC URINALYSIS (UMFC): MUCUS RE: ABSENT

## 2015-11-17 MED ORDER — CIPROFLOXACIN HCL 500 MG PO TABS: 500.0000 mg | ORAL_TABLET | Freq: Two times a day (BID) | ORAL | 0 refills | Status: DC

## 2015-11-17 NOTE — Progress Notes (Signed)
11/18/2015 9:09 AM   DOB: 08-06-1933 / MRN: [id number]  SUBJECTIVE:  [name] is a 82 y.o. male presenting for dysuria that began 4-5 days ago and is worsening.  Also complains of urinary frequency. He associates generalized fatigue and muscle aches.  He feels he is getting worse.  Has not tried anything for his symptoms.  States that he had similar symptoms about 1.5 years ago and was found to have prostatitis with elevated PSA. He was given Cipro at that time which he tolerated without complaint and this resolved his symptoms.    He is allergic to cyclobenzaprine hcl and tetracycline.   He  has a past medical history of Angina pectoris; Aortic stenosis, severe; Arthritis; Cardiac arrest ([name]); Claudication [facility]); Coronary artery disease; Essential hypertension; Headache; Heart murmur; History of hiatal hernia; Hyperlipidemia; Kidney stones; Myocardial infarction [facility]); Peripheral vascular disease ([place]); and Presence of automatic cardioverter/defibrillator (AICD).    He  reports that he quit smoking about 63 years ago. He has never used smokeless tobacco. He reports that he drinks about 3.0 - 3.6 oz of alcohol per week . He reports that he does not use drugs. He  has no sexual activity history on file. The patient  has a past surgical history that includes Coronary artery bypass graft; Appendectomy; Tonsillectomy; Adenoidectomy; cataract surgery; left elbow tendon surgery; Cardiac catheterization ( 11/16/2009 ); left and right heart catheterization with coronary/graft angiogram (N/A, 02/01/2014); Tonsillectomy; Hernia repair; Cardiac defibrillator placement; Transcatheter aortic valve replacement, transfemoral (N/A, 03/23/2014); and TEE without cardioversion (N/A, 03/23/2014).  His family history includes Coronary artery disease in his other; Heart attack (age of onset: 97) in his father.  Review of Systems  Cardiovascular: Negative for chest pain.  Gastrointestinal: Negative for abdominal  pain.  Genitourinary: Positive for dysuria and frequency. Negative for urgency.  Skin: Negative for rash.  Neurological: Negative for dizziness and headaches.    The problem list and medications were reviewed and updated by myself where necessary and exist elsewhere in the encounter.   OBJECTIVE:  BP (!) 114/50 (BP Location: Right Arm, Patient Position: Sitting, Cuff Size: Normal)   Pulse 75   Temp 98.2 F (36.8 C)   Resp 16   Ht 5' 5.5" (1.664 m)   Wt 162 lb (73.5 kg)   SpO2 95%   BMI 26.55 kg/m   Physical Exam  Constitutional: He is oriented to person, place, and time. He appears well-developed and well-nourished.  Cardiovascular: Normal rate and regular rhythm.   Pulmonary/Chest: Effort normal and breath sounds normal.  Abdominal: Soft. Bowel sounds are normal. There is no tenderness. There is no CVA tenderness.  Genitourinary: Rectum normal. Prostate is tender. Prostate is not enlarged.  Musculoskeletal: Normal range of motion.  Neurological: He is alert and oriented to person, place, and time.  Skin: Skin is warm and dry.    Results for orders placed or performed in visit on 11/17/15 (from the past 72 hour(s))  POCT urinalysis dipstick     Status: Abnormal   Collection Time: 11/17/15  5:31 PM  Result Value Ref Range   Color, UA yellow yellow   Clarity, UA clear clear   Glucose, UA negative negative   Bilirubin, UA negative negative   Ketones, POC UA negative negative   Spec Grav, UA 1.020    Blood, UA trace-intact (A) negative   pH, UA 6.5    Protein Ur, POC negative negative   Urobilinogen, UA 0.2    Nitrite, UA Negative  Negative   Leukocytes, UA Trace (A) Negative  POCT Microscopic Urinalysis ([facility])     Status: Abnormal   Collection Time: 11/17/15  5:32 PM  Result Value Ref Range   WBC,UR,HPF,POC Few (A) None WBC/hpf   RBC,UR,HPF,POC Few (A) None RBC/hpf   Bacteria None None, Too numerous to count   Mucus Absent Absent   Epithelial Cells, UR Per  Microscopy Few (A) None, Too numerous to count cells/hpf  POCT CBC     Status: Abnormal   Collection Time: 11/17/15  6:11 PM  Result Value Ref Range   WBC 6.7 4.6 - 10.2 K/uL   Lymph, poc 1.0 0.6 - 3.4   POC LYMPH PERCENT 14.3 10 - 50 %L   MID (cbc) 0.6 0 - 0.9   POC MID % 8.4 0 - 12 %M   POC Granulocyte 5.2 2 - 6.9   Granulocyte percent 77.3 37 - 80 %G   RBC 4.00 (A) 4.69 - 6.13 M/uL   Hemoglobin 13.0 (A) 14.1 - 18.1 g/dL   HCT, POC 36.7 (A) 43.5 - 53.7 %   MCV 91.8 80 - 97 fL   MCH, POC 32.7 (A) 27 - 31.2 pg   MCHC 35.6 (A) 31.8 - 35.4 g/dL   RDW, POC 14.0 %   Platelet Count, POC 129 (A) 142 - 424 K/uL   MPV 8.3 0 - 99.8 fL   Lab Results  Component Value Date   PSA 7.96 (H) 09/08/2014   PSA 26.66 (H) 08/25/2014     No results found.  ASSESSMENT AND PLAN  [name] was seen today for urinary frequency and dysuria.  Diagnoses and all orders for this visit:  Urinary frequency: He has a recent history of prostatitis and his symptoms and exam are consistent with that diagnosis today.  He has tolerated cipro in the past. I will restart that today and see him back in 4 days.  -     POCT urinalysis dipstick -     POCT Microscopic Urinalysis ([facility]) -     Urine culture  Dysuria -     POCT urinalysis dipstick -     POCT Microscopic Urinalysis ([facility]) -     POCT CBC -     PSA -     Urine culture  Tenderness of prostate -     ciprofloxacin (CIPRO) 500 MG tablet; Take 1 tablet (500 mg total) by mouth 2 (two) times daily.    The patient is advised to call or return to clinic if he does not see an improvement in symptoms, or to seek the care of the closest emergency department if he worsens with the above plan.   [name], MHS, PA-C [facility] 11/18/2015 9:09 AM

## 2015-11-17 NOTE — Patient Instructions (Signed)
     IF you received an x-ray today, you will receive an invoice from [facility]. Please contact [facility] at [phone] with questions or concerns regarding your invoice.   IF you received labwork today, you will receive an invoice from [facility]. Please contact [facility] at [phone] with questions or concerns regarding your invoice.   Our billing staff will not be able to assist you with questions regarding bills from these companies.  You will be contacted with the lab results as soon as they are available. The fastest way to get your results is to activate your My Chart account. Instructions are located on the last page of this paperwork. If you have not heard from us regarding the results in 2 weeks, please contact this office.      

## 2015-11-17 NOTE — Progress Notes (Deleted)
    MRN: [id number] DOB: Aug 18, 1933  Subjective:   [name] is a 82 y.o. male presenting for chief complaint of Urinary Frequency and Dysuria (x 2-3 days)  Reports 3 day history of dysuria, urinary frequency  TRUE has a current medication list which includes the following prescription(s): amlodipine, azithromycin, coenzyme q10, diazepam, furosemide, isosorbide mononitrate, krill oil, latanoprost, lisinopril, magnesium oxide, metoprolol, metoprolol, multivitamin, niacin, nitroglycerin, potassium chloride, pradaxa, rosuvastatin, and thiamine. Also is allergic to cyclobenzaprine hcl and tetracycline.  [name]  has a past medical history of Angina pectoris; Aortic stenosis, severe; Arthritis; Cardiac arrest ([place]); Claudication [facility]); Coronary artery disease; Essential hypertension; Headache; Heart murmur; History of hiatal hernia; Hyperlipidemia; Kidney stones; Myocardial infarction [facility]); Peripheral vascular disease ([place]); and Presence of automatic cardioverter/defibrillator (AICD). Also  has a past surgical history that includes Coronary artery bypass graft; Appendectomy; Tonsillectomy; Adenoidectomy; cataract surgery; left elbow tendon surgery; Cardiac catheterization ( 11/16/2009 ); left and right heart catheterization with coronary/graft angiogram (N/A, 02/01/2014); Tonsillectomy; Hernia repair; Cardiac defibrillator placement; Transcatheter aortic valve replacement, transfemoral (N/A, 03/23/2014); and TEE without cardioversion (N/A, 03/23/2014).  Objective:   Vitals: BP (!) 114/50 (BP Location: Right Arm, Patient Position: Sitting, Cuff Size: Normal)   Pulse 75   Temp 98.2 F (36.8 C)   Resp 16   Ht 5' 5.5" (1.664 m)   Wt 162 lb (73.5 kg)   SpO2 95%   BMI 26.55 kg/m   Physical Exam  Results for orders placed or performed in visit on 11/17/15 (from the past 24 hour(s))  POCT urinalysis dipstick     Status: Abnormal   Collection Time: 11/17/15  5:31 PM  Result Value Ref Range   Color, UA yellow yellow   Clarity, UA clear clear   Glucose, UA negative negative   Bilirubin, UA negative negative   Ketones, POC UA negative negative   Spec Grav, UA 1.020    Blood, UA trace-intact (A) negative   pH, UA 6.5    Protein Ur, POC negative negative   Urobilinogen, UA 0.2    Nitrite, UA Negative Negative   Leukocytes, UA Trace (A) Negative  POCT Microscopic Urinalysis ([facility])     Status: Abnormal   Collection Time: 11/17/15  5:32 PM  Result Value Ref Range   WBC,UR,HPF,POC Few (A) None WBC/hpf   RBC,UR,HPF,POC Few (A) None RBC/hpf   Bacteria None None, Too numerous to count   Mucus Absent Absent   Epithelial Cells, UR Per Microscopy Few (A) None, Too numerous to count cells/hpf    Assessment and Plan :     Jaynee Eagles, PA-C [facility] [phone] 11/17/2015 5:33 PM

## 2015-11-18 LAB — PSA: PSA: 9.9 ng/mL — ABNORMAL HIGH (ref <=4.0)

## 2015-11-21 ENCOUNTER — Encounter: Payer: Self-pay | Admitting: Physician Assistant

## 2015-11-21 ENCOUNTER — Ambulatory Visit (INDEPENDENT_AMBULATORY_CARE_PROVIDER_SITE_OTHER): Payer: Medicare Other | Admitting: Physician Assistant

## 2015-11-21 VITALS — BP 118/72 | HR 68 | Temp 97.9°F | Resp 18 | Ht 65.5 in | Wt 158.9 lb

## 2015-11-21 DIAGNOSIS — N410 Acute prostatitis: Secondary | ICD-10-CM

## 2015-11-21 DIAGNOSIS — I255 Ischemic cardiomyopathy: Secondary | ICD-10-CM

## 2015-11-21 LAB — URINE CULTURE

## 2015-11-21 NOTE — Progress Notes (Signed)
   11/21/2015 4:47 PM   DOB: 09/30/1933 / MRN: [id number]  SUBJECTIVE:  [name] is a 82 y.o. male presenting for recheck.  I had suspected he had prostatitis based on his symptoms about four days ago.  I started him on Cipro bid and today he reports feeling much better.  He denies feeling fatigue, myalgia, and reports his urinary symptoms are back to base line (he is chronically incontinent).  He has a urologist, Dr. Shirlyn Goltz and is amenable to seeing him back given this has happened twice now in roughly 1 year.   He is allergic to cyclobenzaprine hcl and tetracycline.   He  has a past medical history of Angina pectoris; Aortic stenosis, severe; Arthritis; Cardiac arrest ([place]); Claudication [facility]); Coronary artery disease; Essential hypertension; Headache; Heart murmur; History of hiatal hernia; Hyperlipidemia; Kidney stones; Myocardial infarction [facility]); Peripheral vascular disease ([place]); and Presence of automatic cardioverter/defibrillator (AICD).    He  reports that he quit smoking about 63 years ago. He has never used smokeless tobacco. He reports that he drinks about 3.0 - 3.6 oz of alcohol per week . He reports that he does not use drugs. He  has no sexual activity history on file. The patient  has a past surgical history that includes Coronary artery bypass graft; Appendectomy; Tonsillectomy; Adenoidectomy; cataract surgery; left elbow tendon surgery; Cardiac catheterization ( 11/16/2009 ); left and right heart catheterization with coronary/graft angiogram (N/A, 02/01/2014); Tonsillectomy; Hernia repair; Cardiac defibrillator placement; Transcatheter aortic valve replacement, transfemoral (N/A, 03/23/2014); and TEE without cardioversion (N/A, 03/23/2014).  His family history includes Coronary artery disease in his other; Heart attack (age of onset: 29) in his father.  Review of Systems  Constitutional: Negative for chills and fever.  Genitourinary: Negative for dysuria.  Musculoskeletal:  Negative for myalgias.  Skin: Negative for rash.  Neurological: Negative for dizziness and headaches.    The problem list and medications were reviewed and updated by myself where necessary and exist elsewhere in the encounter.   OBJECTIVE:  BP 118/72   Pulse 68   Temp 97.9 F (36.6 C) (Oral)   Resp 18   Ht 5' 5.5" (1.664 m)   Wt 158 lb 14.4 oz (72.1 kg)   SpO2 96%   BMI 26.04 kg/m   Pulse Readings from Last 3 Encounters:  11/21/15 68  11/17/15 75  08/25/15 84     Physical Exam  Constitutional: He is oriented to person, place, and time.  Cardiovascular: Normal rate and regular rhythm.   Pulmonary/Chest: Effort normal and breath sounds normal.  Musculoskeletal: Normal range of motion.  Neurological: He is alert and oriented to person, place, and time.    No results found for this or any previous visit (from the past 72 hour(s)).  No results found.  ASSESSMENT AND PLAN  [name] was seen today for follow-up.  Diagnoses and all orders for this visit:  Acute prostatitis: Resolving.  Will check a PSA to ensure he is heading in the right direction. Will get him in with his urologist given he has had this happen twice in the last year or so.  -     PSA    The patient is advised to call or return to clinic if he does not see an improvement in symptoms, or to seek the care of the closest emergency department if he worsens with the above plan.   [name], MHS, PA-C [facility] 11/21/2015 4:47 PM

## 2015-11-21 NOTE — Patient Instructions (Signed)
     IF you received an x-ray today, you will receive an invoice from [facility]. Please contact [facility] at [phone] with questions or concerns regarding your invoice.   IF you received labwork today, you will receive an invoice from [facility]. Please contact [facility] at [phone] with questions or concerns regarding your invoice.   Our billing staff will not be able to assist you with questions regarding bills from these companies.  You will be contacted with the lab results as soon as they are available. The fastest way to get your results is to activate your My Chart account. Instructions are located on the last page of this paperwork. If you have not heard from us regarding the results in 2 weeks, please contact this office.      

## 2015-11-22 LAB — PSA: PSA: 4.9 ng/mL — AB (ref <=4.0)

## 2015-12-01 ENCOUNTER — Encounter (HOSPITAL_COMMUNITY): Payer: Self-pay | Admitting: Emergency Medicine

## 2015-12-01 ENCOUNTER — Emergency Department (HOSPITAL_COMMUNITY)
Admission: EM | Admit: 2015-12-01 | Discharge: 2015-12-01 | Disposition: A | Discharge status: Home / Self Care | Payer: Medicare Other | Attending: Emergency Medicine | Admitting: Emergency Medicine

## 2015-12-01 DIAGNOSIS — S39012A Strain of muscle, fascia and tendon of lower back, initial encounter: Secondary | ICD-10-CM | POA: Insufficient documentation

## 2015-12-01 DIAGNOSIS — W1789XA Other fall from one level to another, initial encounter: Secondary | ICD-10-CM | POA: Diagnosis not present

## 2015-12-01 DIAGNOSIS — T148XXA Other injury of unspecified body region, initial encounter: Secondary | ICD-10-CM

## 2015-12-01 DIAGNOSIS — M545 Low back pain: Secondary | ICD-10-CM | POA: Diagnosis not present

## 2015-12-01 DIAGNOSIS — Z87891 Personal history of nicotine dependence: Secondary | ICD-10-CM | POA: Diagnosis not present

## 2015-12-01 DIAGNOSIS — I10 Essential (primary) hypertension: Secondary | ICD-10-CM | POA: Diagnosis not present

## 2015-12-01 DIAGNOSIS — S3992XA Unspecified injury of lower back, initial encounter: Secondary | ICD-10-CM | POA: Diagnosis present

## 2015-12-01 DIAGNOSIS — Z79899 Other long term (current) drug therapy: Secondary | ICD-10-CM | POA: Diagnosis not present

## 2015-12-01 DIAGNOSIS — I2510 Atherosclerotic heart disease of native coronary artery without angina pectoris: Secondary | ICD-10-CM | POA: Insufficient documentation

## 2015-12-01 DIAGNOSIS — Y939 Activity, unspecified: Secondary | ICD-10-CM | POA: Insufficient documentation

## 2015-12-01 DIAGNOSIS — I252 Old myocardial infarction: Secondary | ICD-10-CM | POA: Diagnosis not present

## 2015-12-01 DIAGNOSIS — Y999 Unspecified external cause status: Secondary | ICD-10-CM | POA: Diagnosis not present

## 2015-12-01 DIAGNOSIS — Y929 Unspecified place or not applicable: Secondary | ICD-10-CM | POA: Diagnosis not present

## 2015-12-01 DIAGNOSIS — M5489 Other dorsalgia: Secondary | ICD-10-CM

## 2015-12-01 NOTE — ED Triage Notes (Addendum)
Patient states that he was mowing weeds at ditch with slight embankment and lawn mower flipped over on top of patient. Patient c/o lower back pain. Patient denies any LOC but does takes Perdaxa.  Patient has no bruising on back.

## 2015-12-01 NOTE — ED Provider Notes (Signed)
[facility] DEPT Provider Note   CSN: [id number] Arrival date & time: 12/01/15  [id number]     History   Chief Complaint Chief Complaint  Patient presents with  . lawn mower flipped  . Back Pain    HPI [name] is a 82 y.o. male.  82 year old male presents after falling off his lawnmower. States he ran into a ditch. Denies any loss of consciousness. Complains of some bilateral lower back pain without radiation to his legs. Denies any bruising to the skin. No dark or bloody urine. Denies any chest or abdominal discomfort. Pain is characterized as dull and worse with movement. No radiation down his legs. Took Tylenol with some relief of his symptoms.      Past Medical History:  Diagnosis Date  . Angina pectoris     Recurrent angina pectoris  . Aortic stenosis, severe    s/p TAVR with Edwards Sapien 3 THV (size 26 mm, model # 9600TFX, serial # [id number])  . Arthritis   . Cardiac arrest [facility])    aborted, atrial fibrillation/flutter  . Claudication ([place])   . Coronary artery disease   . Essential hypertension   . Headache   . Heart murmur   . History of hiatal hernia   . Hyperlipidemia   . Kidney stones   . Myocardial infarction ([place])   . Peripheral vascular disease ([place])   . Presence of automatic cardioverter/defibrillator (AICD)    Medtronic, remote-yes.     Patient Active Problem List   Diagnosis Date Noted  . Acute pharyngitis 08/25/2015  . Sleep difficulties 07/29/2015  . Erectile disorder due to medical condition in male patient 12/17/2014  . Severe aortic valve stenosis - s/p  Edwards Sapien 3 THV (size 26 mm, model # 9600TFX, serial # [id number]) 03/23/2014 03/23/2014  . Abnormal nuclear stress test 01/29/2014  . Ischemic cardiomyopathy Dec 26, 2011  . 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 Back pain 07/10/2010  . Hypercholesteremia 07/10/2010  . Benign hypertensive heart disease without heart failure 07/10/2010  . S/P CABG (coronary artery bypass graft)  07/10/2010  . ICD-Medtronic 03/30/2010  . Atrial fibrillation ([place]) 09-15-2009    Past Surgical History:  Procedure Laterality Date  . ADENOIDECTOMY    . APPENDECTOMY    . CARDIAC CATHETERIZATION   11/16/2009   . CARDIAC DEFIBRILLATOR PLACEMENT    . cataract surgery      Bilateral cataract surgery  . CORONARY ARTERY BYPASS GRAFT     in 1982 and '84 with subsequent PTCI  . HERNIA REPAIR    . LEFT AND RIGHT HEART CATHETERIZATION WITH CORONARY/GRAFT ANGIOGRAM N/A 02/01/2014   Procedure: LEFT AND RIGHT HEART CATHETERIZATION WITH Beatrix Fetters;  Surgeon: Sinclair Grooms, MD;  Location: [facility] CATH LAB;  Service: Cardiovascular;  Laterality: N/A;  . left elbow tendon surgery    . TEE WITHOUT CARDIOVERSION N/A 03/23/2014   Procedure: TRANSESOPHAGEAL ECHOCARDIOGRAM (TEE);  Surgeon: Burnell Blanks, MD;  Location: [facility];  Service: Open Heart Surgery;  Laterality: N/A;  . TONSILLECTOMY    . TONSILLECTOMY    . TRANSCATHETER AORTIC VALVE REPLACEMENT, TRANSFEMORAL N/A 03/23/2014   Procedure: TRANSCATHETER AORTIC VALVE REPLACEMENT, TRANSFEMORAL;  Surgeon: Burnell Blanks, MD;  Location: [facility];  Service: Open Heart Surgery;  Laterality: N/A;       Home Medications    Prior to Admission medications   Medication Sig Start Date End Date Taking? Authorizing Provider  amLODipine (NORVASC) 5 MG tablet Take 1 tablet by mouth daily. 05/24/14  Yes Historical Provider, MD  Coenzyme Q10 (CO Q 10 PO) Take 1 tablet by mouth daily.    Yes Historical Provider, MD  diazepam (VALIUM) 5 MG tablet Take 1 tablet (5 mg total) by mouth daily as needed for anxiety. 07/29/15  Yes Binnie Rail, MD  furosemide (LASIX) 40 MG tablet TAKE 1 TABLET BY MOUTH DAILY. 12/06/14  Yes Darlin Coco, MD  isosorbide mononitrate (IMDUR) 30 MG 24 hr tablet TAKE 1/2 TABLET BY MOUTH DAILY 07/28/15  Yes Burnell Blanks, MD  KRILL OIL PO Take 1 tablet by mouth daily.   Yes Historical Provider, MD    latanoprost (XALATAN) 0.005 % ophthalmic solution Place 1 drop into both eyes at bedtime.  05/28/11  Yes Historical Provider, MD  lisinopril (PRINIVIL,ZESTRIL) 20 MG tablet Take 1 tablet (20 mg total) by mouth daily. 08/23/14  Yes Darlin Coco, MD  Magnesium Oxide (MAG-OXIDE PO) Take 1 tablet by mouth at bedtime.    Yes Historical Provider, MD  metoprolol (LOPRESSOR) 100 MG tablet TAKE 1/2 TABLET BY MOUTH EVERY MORNING. 07/06/15  Yes Darlin Coco, MD  metoprolol (LOPRESSOR) 50 MG tablet TAKE 1/2 TABLET (25 MG) BY MOUTH EVERY EVENING. 07/26/15  Yes Amber Sena Slate, NP  Multiple Vitamin (MULTIVITAMIN) tablet Take 1 tablet by mouth at bedtime.    Yes Historical Provider, MD  niacin (SLO-NIACIN) 500 MG tablet Take 500 mg by mouth at bedtime.     Yes Historical Provider, MD  potassium chloride (K-DUR,KLOR-CON) 10 MEQ tablet Take 10 mEq by mouth 2 (two) times daily.     Yes Historical Provider, MD  PRADAXA 150 MG CAPS capsule TAKE 1 CAPSULE BY MOUTH TWICE DAILY 10/06/15  Yes Deboraha Sprang, MD  rosuvastatin (CRESTOR) 20 MG tablet Take 1 tablet (20 mg total) by mouth daily. 07/06/15  Yes Burnell Blanks, MD  Thiamine HCl (VITAMIN B-1) 100 MG tablet Take 100 mg by mouth daily.     Yes Historical Provider, MD  azithromycin (ZITHROMAX Z-PAK) 250 MG tablet 2 pills on day1 , then 1 pill per day until fiinished Patient not taking: Reported on 12/01/2015 08/25/15   Biagio Borg, MD  ciprofloxacin (CIPRO) 500 MG tablet Take 1 tablet (500 mg total) by mouth 2 (two) times daily. Patient not taking: Reported on 12/01/2015 11/17/15   Tereasa Coop, PA-C  nitroGLYCERIN (NITROSTAT) 0.4 MG SL tablet Place 1 tablet (0.4 mg total) under the tongue every 5 (five) minutes as needed for chest pain (for chest pain (MAX of 3 doses)). 04/12/15   Burnell Blanks, MD    Family History Family History  Problem Relation Age of Onset  . Heart attack Father 56  . Coronary artery disease Other     family history is  dignificant for early CAD in several members    Social History Social History  Substance Use Topics  . Smoking status: Former Smoker    Quit date: 10/25/1952  . Smokeless tobacco: Never Used  . Alcohol use 3.0 - 3.6 oz/week    5 - 6 Glasses of wine per week     Comment: 5-6 times a week     Allergies   Cyclobenzaprine hcl and Tetracycline   Review of Systems Review of Systems  All other systems reviewed and are negative.    Physical Exam Updated Vital Signs BP 126/71 (BP Location: Left Arm)   Pulse 64   Temp 97.8 F (36.6 C) (Oral)   Resp 18   Ht 5' 6.5" (  1.689 m)   Wt 69.6 kg   SpO2 98%   BMI 24.39 kg/m   Physical Exam  Constitutional: He is oriented to person, place, and time. He appears well-developed and well-nourished.  Non-toxic appearance. No distress.  HENT:  Head: Normocephalic and atraumatic.  Eyes: Conjunctivae, EOM and lids are normal. Pupils are equal, round, and reactive to light.  Neck: Normal range of motion. Neck supple. No tracheal deviation present. No thyroid mass present.  Cardiovascular: Normal rate, regular rhythm and normal heart sounds.  Exam reveals no gallop.   No murmur heard. Pulmonary/Chest: Effort normal and breath sounds normal. No stridor. No respiratory distress. He has no decreased breath sounds. He has no wheezes. He has no rhonchi. He has no rales.  Abdominal: Soft. Normal appearance and bowel sounds are normal. He exhibits no distension. There is no tenderness. There is no rebound and no CVA tenderness.  Musculoskeletal: Normal range of motion. He exhibits no edema.       Lumbar back: He exhibits tenderness.       Back:  Neurological: He is alert and oriented to person, place, and time. He has normal strength. No cranial nerve deficit or sensory deficit. GCS eye subscore is 4. GCS verbal subscore is 5. GCS motor subscore is 6.  Skin: Skin is warm and dry. No abrasion and no rash noted.  Psychiatric: He has a normal mood and  affect. His speech is normal and behavior is normal.  Nursing note and vitals reviewed.    ED Treatments / Results  Labs (all labs ordered are listed, but only abnormal results are displayed) Labs Reviewed - No data to display  EKG  EKG Interpretation None       Radiology No results found.  Procedures Procedures (including critical care time)  Medications Ordered in ED Medications - No data to display   Initial Impression / Assessment and Plan / ED Course  I have reviewed the triage vital signs and the nursing notes.  Pertinent labs & imaging results that were available during my care of the patient were reviewed by me and considered in my medical decision making (see chart for details).  Clinical Course    Patient nontender along his lumbar spine. No obvious bruising to the skin. Will not perform x-rays at this time. Return precautions given  Final Clinical Impressions(s) / ED Diagnoses   Final diagnoses:  None    New Prescriptions New Prescriptions   No medications on file     Lacretia Leigh, MD 12/01/15 2122

## 2015-12-05 ENCOUNTER — Other Ambulatory Visit: Payer: Medicare Other | Admitting: *Deleted

## 2015-12-05 ENCOUNTER — Other Ambulatory Visit: Payer: Self-pay | Admitting: *Deleted

## 2015-12-05 DIAGNOSIS — I119 Hypertensive heart disease without heart failure: Secondary | ICD-10-CM

## 2015-12-05 DIAGNOSIS — E7800 Pure hypercholesterolemia, unspecified: Secondary | ICD-10-CM

## 2015-12-05 LAB — COMPREHENSIVE METABOLIC PANEL
ALBUMIN: 4 g/dL (ref 3.6–5.1)
ALT: 12 U/L (ref 9–46)
AST: 17 U/L (ref 10–35)
Alkaline Phosphatase: 59 U/L (ref 40–115)
BUN: 21 mg/dL (ref 7–25)
CALCIUM: 9.8 mg/dL (ref 8.6–10.3)
CHLORIDE: 108 mmol/L (ref 98–110)
CO2: 26 mmol/L (ref 20–31)
CREATININE: 0.99 mg/dL (ref 0.70–1.18)
Glucose, Bld: 97 mg/dL (ref 65–99)
Potassium: 4.4 mmol/L (ref 3.5–5.3)
Sodium: 141 mmol/L (ref 135–146)
Total Bilirubin: 0.7 mg/dL (ref 0.2–1.2)
Total Protein: 6.5 g/dL (ref 6.1–8.1)

## 2015-12-05 LAB — LIPID PANEL
CHOL/HDL RATIO: 3.1 ratio (ref <=5.0)
Cholesterol: 129 mg/dL (ref 125–200)
HDL: 41 mg/dL (ref >=40)
LDL CALC: 70 mg/dL (ref <130)
TRIGLYCERIDES: 92 mg/dL (ref <150)
VLDL: 18 mg/dL (ref <30)

## 2015-12-07 ENCOUNTER — Other Ambulatory Visit: Payer: Self-pay | Admitting: Cardiovascular Disease

## 2015-12-07 ENCOUNTER — Ambulatory Visit (INDEPENDENT_AMBULATORY_CARE_PROVIDER_SITE_OTHER): Payer: Medicare Other | Admitting: Cardiovascular Disease

## 2015-12-07 ENCOUNTER — Encounter: Payer: Self-pay | Admitting: Cardiovascular Disease

## 2015-12-07 VITALS — BP 108/50 | HR 80 | Ht 66.5 in | Wt 155.6 lb

## 2015-12-07 DIAGNOSIS — E7800 Pure hypercholesterolemia, unspecified: Secondary | ICD-10-CM | POA: Diagnosis not present

## 2015-12-07 DIAGNOSIS — I480 Paroxysmal atrial fibrillation: Secondary | ICD-10-CM

## 2015-12-07 DIAGNOSIS — I350 Nonrheumatic aortic (valve) stenosis: Secondary | ICD-10-CM | POA: Diagnosis not present

## 2015-12-07 DIAGNOSIS — I5022 Chronic systolic (congestive) heart failure: Secondary | ICD-10-CM | POA: Diagnosis not present

## 2015-12-07 DIAGNOSIS — Z954 Presence of other heart-valve replacement: Secondary | ICD-10-CM | POA: Diagnosis not present

## 2015-12-07 DIAGNOSIS — I255 Ischemic cardiomyopathy: Secondary | ICD-10-CM

## 2015-12-07 DIAGNOSIS — Z952 Presence of prosthetic heart valve: Secondary | ICD-10-CM

## 2015-12-07 MED ORDER — SILDENAFIL CITRATE 50 MG PO TABS
50.0000 mg | ORAL_TABLET | Freq: Every day | ORAL | 3 refills | Status: DC | PRN
Start: 2015-12-07 — End: 2015-12-23

## 2015-12-07 MED ORDER — AMLODIPINE BESYLATE 5 MG PO TABS: 5.0000 mg | ORAL_TABLET | Freq: Every day | ORAL | 3 refills | Status: DC

## 2015-12-07 NOTE — Progress Notes (Signed)
Chief Complaint  Patient presents with  . Aortic Stenosis      History of Present Illness: 82 yo male with history of CAD s/p CABG (1982 with redo 1994), ischemic cardiomyopathy, out-of-hospital cardiac arrest in 2011 with successful resuscitation and subsequent placement of Medtronic ICD, and aortic stenosis s/p TAVR who is here today for cardiac follow up. I performed his TAVR procedure on 03/23/14. His cardiac issues had been followed by Dr. Patty Sermons.  Echo January 2017 with preserved LV systolic function with normally functioning bioprosthetic aortic valve. There is trivial AI. Cardiac cath October 2015 with total occlusion of left main and RCA. Patent LIMA to LAD, patent SVG to OM, patent SVG to RCA. Of note, diffuse disease in LAD both before and after LIMA graft insertion.   He tells me today that he is feeling well. He has no chest pain and no dyspnea. He is exercising every day on the stationary bike and on the treadmilll. He denies any PND, orthopnea, or lower extremity edema. He denies dizziness, near syncope or syncope. He is having erectile dysfunction.   Primary Care Physician: Pincus Sanes, MD   Past Medical History:  Diagnosis Date  . Angina pectoris     Recurrent angina pectoris  . Aortic stenosis, severe    s/p TAVR with Edwards Sapien 3 THV (size 26 mm, model # 9600TFX, serial # [id number])  . Arthritis   . Cardiac arrest [facility] [address])    aborted, atrial fibrillation/flutter  . Claudication ([facility])   . Coronary artery disease   . Essential hypertension   . Headache   . Heart murmur   . History of hiatal hernia   . Hyperlipidemia   . Kidney stones   . Myocardial infarction ([facility])   . Peripheral vascular disease ([facility])   . Presence of automatic cardioverter/defibrillator (AICD)    Medtronic, remote-yes.     Past Surgical History:  Procedure Laterality Date  . ADENOIDECTOMY    . APPENDECTOMY    . CARDIAC CATHETERIZATION   11/16/2009   . CARDIAC DEFIBRILLATOR PLACEMENT     . cataract surgery      Bilateral cataract surgery  . CORONARY ARTERY BYPASS GRAFT     in 1982 and '84 with subsequent PTCI  . HERNIA REPAIR    . LEFT AND RIGHT HEART CATHETERIZATION WITH CORONARY/GRAFT ANGIOGRAM N/A 02/01/2014   Procedure: LEFT AND RIGHT HEART CATHETERIZATION WITH Isabel Caprice;  Surgeon: Lesleigh Noe, MD;  Location: [facility] CATH LAB;  Service: Cardiovascular;  Laterality: N/A;  . left elbow tendon surgery    . TEE WITHOUT CARDIOVERSION N/A 03/23/2014   Procedure: TRANSESOPHAGEAL ECHOCARDIOGRAM (TEE);  Surgeon: Kathleene Hazel, MD;  Location: [facility] OR;  Service: Open Heart Surgery;  Laterality: N/A;  . TONSILLECTOMY    . TONSILLECTOMY    . TRANSCATHETER AORTIC VALVE REPLACEMENT, TRANSFEMORAL N/A 03/23/2014   Procedure: TRANSCATHETER AORTIC VALVE REPLACEMENT, TRANSFEMORAL;  Surgeon: Kathleene Hazel, MD;  Location: [facility] OR;  Service: Open Heart Surgery;  Laterality: N/A;    Current Outpatient Prescriptions  Medication Sig Dispense Refill  . amLODipine (NORVASC) 5 MG tablet Take 1 tablet by mouth daily.    . Coenzyme Q10 (CO Q 10 PO) Take 1 tablet by mouth daily.     . diazepam (VALIUM) 5 MG tablet Take 1 tablet (5 mg total) by mouth daily as needed for anxiety. 30 tablet 1  . furosemide (LASIX) 40 MG tablet TAKE 1 TABLET BY MOUTH DAILY. 90 tablet 2  .  KRILL OIL PO Take 1 tablet by mouth daily.    Marland Kitchen latanoprost (XALATAN) 0.005 % ophthalmic solution Place 1 drop into both eyes at bedtime.     Marland Kitchen lisinopril (PRINIVIL,ZESTRIL) 20 MG tablet Take 1 tablet (20 mg total) by mouth daily. 90 tablet 1  . Magnesium Oxide (MAG-OXIDE PO) Take 1 tablet by mouth at bedtime.     . metoprolol (LOPRESSOR) 100 MG tablet TAKE 1/2 TABLET BY MOUTH EVERY MORNING. 45 tablet 1  . metoprolol (LOPRESSOR) 50 MG tablet TAKE 1/2 TABLET (25 MG) BY MOUTH EVERY EVENING. 45 tablet 0  . Multiple Vitamin (MULTIVITAMIN) tablet Take 1 tablet by mouth at bedtime.     . nitroGLYCERIN  (NITROSTAT) 0.4 MG SL tablet Place 1 tablet (0.4 mg total) under the tongue every 5 (five) minutes as needed for chest pain (for chest pain (MAX of 3 doses)). 25 tablet 6  . potassium chloride (K-DUR,KLOR-CON) 10 MEQ tablet Take 10 mEq by mouth 2 (two) times daily.      Marland Kitchen PRADAXA 150 MG CAPS capsule TAKE 1 CAPSULE BY MOUTH TWICE DAILY 60 capsule 7  . rosuvastatin (CRESTOR) 20 MG tablet Take 1 tablet (20 mg total) by mouth daily. 90 tablet 3  . Thiamine HCl (VITAMIN B-1) 100 MG tablet Take 100 mg by mouth daily.      . sildenafil (VIAGRA) 50 MG tablet Take 1 tablet (50 mg total) by mouth daily as needed for erectile dysfunction. 5 tablet 3   No current facility-administered medications for this visit.     Allergies  Allergen Reactions  . Cyclobenzaprine Hcl Other (See Comments)    Unspecified reaction pt doesn't know what reaction  . Tetracycline     Rash     Social History   Social History  . Marital status: Married    Spouse name: N/A  . Number of children: 3  . Years of education: N/A   Occupational History  . Retired    Social History Main Topics  . Smoking status: Former Smoker    Quit date: 10/25/1952  . Smokeless tobacco: Never Used  . Alcohol use 3.0 - 3.6 oz/week    5 - 6 Glasses of wine per week     Comment: 5-6 times a week  . Drug use: No  . Sexual activity: Not on file   Other Topics Concern  . Not on file   Social History Narrative  . No narrative on file    Family History  Problem Relation Age of Onset  . Heart attack Father 78  . Coronary artery disease Other     family history is dignificant for early CAD in several members    Review of Systems:  As stated in the HPI and otherwise negative.   BP (!) 108/50   Pulse 80   Ht 5' 6.5" (1.689 m)   Wt 155 lb 9.6 oz (70.6 kg)   BMI 24.74 kg/m   Physical Examination: General: Well developed, well nourished, NAD  HEENT: OP clear, mucus membranes moist  SKIN: warm, dry. No rashes. Neuro: No focal  deficits  Musculoskeletal: Muscle strength 5/5 all ext  Psychiatric: Mood and affect normal  Neck: No JVD, no carotid bruits, no thyromegaly, no lymphadenopathy.  Lungs:Clear bilaterally, no wheezes, rhonci, crackles Cardiovascular: Regular rate and rhythm. Soft diastolic murmur. No gallops or rubs. Abdomen:Soft. Bowel sounds present. Non-tender.  Extremities: No lower extremity edema. Pulses are 2 + in the bilateral DP/PT.  Echo January 2017: Left ventricle:  Posterior basal hypokinesis The cavity size was   normal. Wall thickness was normal. Systolic function was normal.   The estimated ejection fraction was in the range of 50% to 55%. - Aortic valve: Post TAVR with 26 mm Sapien 3 valve No perivalvular   regurgitation. - Left atrium: The atrium was moderately dilated. - Atrial septum: No defect or patent foramen ovale was identified.  EKG:  EKG is not ordered today. The ekg ordered today demonstrates   Recent Labs: 12/14/2014: TSH 1.64 11/17/2015: Hemoglobin 13.0 12/05/2015: ALT 12; BUN 21; Creat 0.99; Potassium 4.4; Sodium 141   Lipid Panel    Component Value Date/Time   CHOL 129 12/05/2015 1002   TRIG 92 12/05/2015 1002   HDL 41 12/05/2015 1002   CHOLHDL 3.1 12/05/2015 1002   VLDL 18 12/05/2015 1002   LDLCALC 70 12/05/2015 1002     Wt Readings from Last 3 Encounters:  12/07/15 155 lb 9.6 oz (70.6 kg)  12/01/15 153 lb 7 oz (69.6 kg)  11/21/15 158 lb 14.4 oz (72.1 kg)     Other studies Reviewed: Additional studies/ records that were reviewed today include: . Review of the above records demonstrates:    Assessment and Plan:   1. Severe Aortic valve stenosis: He had severe symptomatic aortic valve stenosis. Now s/p TAVR on 03/23/14. He is on Pradaxa for atrial fibrillation.   2. CAD s/p CABG: He is s/p CABG in 1982 with redo CABG in 1994. Coronary anatomy stable by cath October 2015. He is on Imdur, statin and beta blocker. No change in chronic stable angina. He is not  on an ASA since he is also on Pradaxa. He wishes to hold Imdur to see if he can use Viagra. Will   3. Atrial fibrillation: He is on metoprolol and Pradaxa.   4. History of cardiac arrest: ICD in place.   5. HLD: He is on a statin. LDL is at goal. Will stop Niacin.  6. Possible sleep apnea/snoring: I have suggested a sleep study. He will call back to schedule  7. Erectile dysfunction: Will stop Imdur. Will try Viagra prn.     Current medicines are reviewed at length with the patient today.  The patient does not have concerns regarding medicines.  The following changes have been made:  no change  Labs/ tests ordered today include:  No orders of the defined types were placed in this encounter.    Disposition:   FU with Dr. Mare Ferrari as planned.     Signed, Lauree Chandler, MD 12/07/2015 10:53 AM    [facility] [address], [place], [place]  [address] Phone: [phone]; Fax: [phone]

## 2015-12-07 NOTE — Patient Instructions (Signed)
Medication Instructions:  Your physician has recommended you make the following change in your medication:  Stop Isosorbide mononitrate. Stop Niacin    Labwork: none  Testing/Procedures: none  Follow-Up: Your physician wants you to follow-up in: 6 months.  You will receive a reminder letter in the mail two months in advance. If you don't receive a letter, please call our office to schedule the follow-up appointment.   Any Other Special Instructions Will Be Listed Below (If Applicable).     If you need a refill on your cardiac medications before your next appointment, please call your pharmacy.

## 2015-12-09 ENCOUNTER — Other Ambulatory Visit: Payer: Self-pay

## 2015-12-14 DIAGNOSIS — M545 Low back pain: Secondary | ICD-10-CM | POA: Diagnosis not present

## 2015-12-19 ENCOUNTER — Other Ambulatory Visit: Payer: Self-pay

## 2015-12-19 NOTE — Telephone Encounter (Signed)
Pat, Can you follow up on this and see what is covered? I doubt any of these types of meds are covered by his insurance. Gerald Stabs

## 2015-12-22 ENCOUNTER — Telehealth: Payer: Self-pay | Admitting: *Deleted

## 2015-12-22 NOTE — Telephone Encounter (Signed)
Patient left a msg on the refill vm stating that the rx for viagra is over $300.00 for just five tablets. He says that the pharmacy informed him that there is a generic medication that would be much cheaper. He did not know the name, but I believe it is revatio 20 mg. Patient can be reached at [phone].  Please advise. Thanks, MI

## 2015-12-23 MED ORDER — SILDENAFIL CITRATE 20 MG PO TABS: ORAL_TABLET | ORAL | 1 refills | Status: DC

## 2015-12-23 NOTE — Telephone Encounter (Signed)
See phone note dated 12/22/15 which addresses this request.

## 2015-12-23 NOTE — Telephone Encounter (Signed)
We have received fax from [facility] requesting sildenafil 20 mg as substitute for Viagra. Reviewed with Dr. Angelena Form and will send prescription to pharmacy. Pt notified of new prescription instructions.

## 2015-12-26 ENCOUNTER — Other Ambulatory Visit: Payer: Self-pay | Admitting: Internal Medicine

## 2015-12-26 DIAGNOSIS — F419 Anxiety disorder, unspecified: Secondary | ICD-10-CM

## 2015-12-26 NOTE — Telephone Encounter (Signed)
RX faxed to [facility] 

## 2015-12-28 ENCOUNTER — Other Ambulatory Visit: Payer: Self-pay | Admitting: *Deleted

## 2015-12-28 MED ORDER — FUROSEMIDE 40 MG PO TABS: 40.0000 mg | ORAL_TABLET | Freq: Every day | ORAL | 3 refills | Status: DC

## 2016-01-16 ENCOUNTER — Telehealth: Payer: Self-pay | Admitting: Cardiology

## 2016-01-16 ENCOUNTER — Encounter: Payer: Medicare Other | Admitting: *Deleted

## 2016-01-16 DIAGNOSIS — Z23 Encounter for immunization: Secondary | ICD-10-CM | POA: Diagnosis not present

## 2016-01-16 NOTE — Telephone Encounter (Signed)
Spoke with pt and reminded pt of remote transmission that is due today. Pt verbalized understanding.   

## 2016-01-20 ENCOUNTER — Encounter: Payer: Self-pay | Admitting: Cardiology

## 2016-01-20 NOTE — Progress Notes (Signed)
Monday January 16, 2016 - letter

## 2016-01-24 ENCOUNTER — Telehealth: Payer: Self-pay | Admitting: Cardiology

## 2016-01-24 DIAGNOSIS — L821 Other seborrheic keratosis: Secondary | ICD-10-CM | POA: Diagnosis not present

## 2016-01-24 DIAGNOSIS — Z85828 Personal history of other malignant neoplasm of skin: Secondary | ICD-10-CM | POA: Diagnosis not present

## 2016-01-24 DIAGNOSIS — L570 Actinic keratosis: Secondary | ICD-10-CM | POA: Diagnosis not present

## 2016-01-24 DIAGNOSIS — D485 Neoplasm of uncertain behavior of skin: Secondary | ICD-10-CM | POA: Diagnosis not present

## 2016-01-24 DIAGNOSIS — L820 Inflamed seborrheic keratosis: Secondary | ICD-10-CM | POA: Diagnosis not present

## 2016-01-24 NOTE — Telephone Encounter (Signed)
LM w/ pt wife for pt to return my call. Pt wife verbalized understanding.

## 2016-01-24 NOTE — Telephone Encounter (Signed)
Spoke w/ pt and informed him that his device has reached ERI. 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 Informed him that a scheduler will call to schedule an appt/ to discuss procedure risk and benefits. Pt verbalized understanding.

## 2016-01-30 ENCOUNTER — Other Ambulatory Visit: Payer: Self-pay | Admitting: Nurse Practitioner

## 2016-02-26 NOTE — Progress Notes (Signed)
Electrophysiology Office Note Date: 02/27/2016  ID:  [name], DOB 1933/09/04, MRN [id number]  PCP: Binnie Rail, MD Primary Cardiologist: Mare Ferrari ->[place] Electrophysiologist: Caryl Comes  CC: ICD at [place] is a 82 y.o. male seen today for Dr Caryl Comes.  He presents today for routine EP follow up. Since last being seen in our clinic, the patient reports doing reasonably well. His ICD has recently been found to be at [facility] by remote interrogation.  He remains active traveling and playing golf.  He denies chest pain, palpitations, dyspnea, PND, orthopnea, nausea, vomiting, dizziness, syncope, edema, weight gain, or early satiety.  He has not had ICD shocks.   Device History: MDT dual chamber ICD implanted 2011 for secondary prevention  History of appropriate therapy: Yes History of AAD therapy: No   Past Medical History:  Diagnosis Date  . Aortic stenosis, severe    s/p TAVR with Edwards Sapien 3 THV (size 26 mm, model # 9600TFX, serial # [id number])  . Arthritis   . Cardiac arrest ([place])   . Claudication ([place])   . Coronary artery disease   . Essential hypertension   . Headache   . History of hiatal hernia   . Hyperlipidemia   . Kidney stones   . Peripheral vascular disease [facility])    Past Surgical History:  Procedure Laterality Date  . ADENOIDECTOMY    . APPENDECTOMY    . CARDIAC CATHETERIZATION   11/16/2009   . CARDIAC DEFIBRILLATOR PLACEMENT    . cataract surgery      Bilateral cataract surgery  . CORONARY ARTERY BYPASS GRAFT     in 1982 and '84 with subsequent PTCI  . HERNIA REPAIR    . LEFT AND RIGHT HEART CATHETERIZATION WITH CORONARY/GRAFT ANGIOGRAM N/A 02/01/2014   Procedure: LEFT AND RIGHT HEART CATHETERIZATION WITH Beatrix Fetters;  Surgeon: Sinclair Grooms, MD;  Location: [facility] CATH LAB;  Service: Cardiovascular;  Laterality: N/A;  . left elbow tendon surgery    . TEE WITHOUT CARDIOVERSION N/A 03/23/2014   Procedure: TRANSESOPHAGEAL  ECHOCARDIOGRAM (TEE);  Surgeon: Burnell Blanks, MD;  Location: [place];  Service: Open Heart Surgery;  Laterality: N/A;  . TONSILLECTOMY    . TRANSCATHETER AORTIC VALVE REPLACEMENT, TRANSFEMORAL N/A 03/23/2014   Procedure: TRANSCATHETER AORTIC VALVE REPLACEMENT, TRANSFEMORAL;  Surgeon: Burnell Blanks, MD;  Location: [place];  Service: Open Heart Surgery;  Laterality: N/A;    Current Outpatient Prescriptions  Medication Sig Dispense Refill  . amLODipine (NORVASC) 5 MG tablet Take 1 tablet (5 mg total) by mouth daily. 90 tablet 3  . Coenzyme Q10 (CO Q 10 PO) Take 1 tablet by mouth daily.     . diazepam (VALIUM) 5 MG tablet TAKE 1 TABLET BY MOUTH DAILY AS NEEDED FOR ANXIETY. 30 tablet 1  . furosemide (LASIX) 40 MG tablet Take 1 tablet (40 mg total) by mouth daily. 90 tablet 3  . KRILL OIL PO Take 1 tablet by mouth daily.    Marland Kitchen latanoprost (XALATAN) 0.005 % ophthalmic solution Place 1 drop into both eyes at bedtime.     Marland Kitchen lisinopril (PRINIVIL,ZESTRIL) 20 MG tablet Take 1 tablet (20 mg total) by mouth daily. 90 tablet 1  . Magnesium Oxide (MAG-OXIDE PO) Take 1 tablet by mouth at bedtime.     . metoprolol (LOPRESSOR) 100 MG tablet TAKE 1/2 TABLET BY MOUTH EVERY MORNING. 45 tablet 1  . metoprolol (LOPRESSOR) 50 MG tablet TAKE 1/2 TABLET BY MOUTH EVERY EVENING.  45 tablet 3  . Multiple Vitamin (MULTIVITAMIN) tablet Take 1 tablet by mouth at bedtime.     . nitroGLYCERIN (NITROSTAT) 0.4 MG SL tablet Place 1 tablet (0.4 mg total) under the tongue every 5 (five) minutes as needed for chest pain (for chest pain (MAX of 3 doses)). 25 tablet 6  . potassium chloride (K-DUR,KLOR-CON) 10 MEQ tablet Take 10 mEq by mouth 2 (two) times daily.      Marland Kitchen PRADAXA 150 MG CAPS capsule TAKE 1 CAPSULE BY MOUTH TWICE DAILY 60 capsule 7  . rosuvastatin (CRESTOR) 20 MG tablet Take 1 tablet (20 mg total) by mouth daily. 90 tablet 3  . sildenafil (REVATIO) 20 MG tablet Take 2-3 tablets by mouth daily as needed 30  tablet 1  . Thiamine HCl (VITAMIN B-1) 100 MG tablet Take 100 mg by mouth daily.       No current facility-administered medications for this visit.     Allergies:   Cyclobenzaprine hcl and Tetracycline   Social History: Social History   Social History  . Marital status: Married    Spouse name: N/A  . Number of children: 3  . Years of education: N/A   Occupational History  . Retired    Social History Main Topics  . Smoking status: Former Smoker    Quit date: 10/25/1952  . Smokeless tobacco: Never Used  . Alcohol use 3.0 - 3.6 oz/week    5 - 6 Glasses of wine per week     Comment: 5-6 times a week  . Drug use: No  . Sexual activity: Not on file   Other Topics Concern  . Not on file   Social History Narrative  . No narrative on file    Family History: Family History  Problem Relation Age of Onset  . Heart attack Father 82  . Coronary artery disease Other     family history is dignificant for early CAD in several members    Review of Systems: All other systems reviewed and are otherwise negative except as noted above.   Physical Exam: VS:  BP 114/60   Pulse 85   Ht 5\' 6"  (1.676 m)   Wt 157 lb 12.8 oz (71.6 kg)   SpO2 98%   BMI 25.47 kg/m  , BMI Body mass index is 25.47 kg/m.  GEN- The patient is elderly appearing, alert and oriented x 3 today.   HEENT: normocephalic, atraumatic; sclera clear, conjunctiva pink; hearing intact; oropharynx clear; neck supple Lungs- Clear to ausculation bilaterally, normal work of breathing.  No wheezes, rales, rhonchi Heart- Regular rate and rhythm (paced) GI- soft, non-tender, non-distended, bowel sounds present Extremities- no clubbing, cyanosis, or edema; DP/PT/radial pulses 2+ bilaterally MS- no significant deformity or atrophy Skin- warm and dry, no rash or lesion; ICD pocket well healed Psych- euthymic mood, full affect Neuro- strength and sensation are intact  ICD interrogation- reviewed in detail today,  See  PACEART report  EKG:  EKG is not ordered today.  Recent Labs: 11/17/2015: Hemoglobin 13.0 12/05/2015: ALT 12; BUN 21; Creat 0.99; Potassium 4.4; Sodium 141   Wt Readings from Last 3 Encounters:  02/27/16 157 lb 12.8 oz (71.6 kg)  12/07/15 155 lb 9.6 oz (70.6 kg)  12/01/15 153 lb 7 oz (69.6 kg)     Other studies Reviewed: Additional studies/ records that were reviewed today include: Dr Caryl Comes and Dr Camillia Herter office notes  Assessment and Plan:  1.  Prior cardiac arrest  ICD at [facility].  Risks,  benefits to generator change discussed with the patient who wishes to proceed. Will plan with Dr Caryl Comes at next available time. Hold Pradaxa for 2 doses prior to procedure.  See PaceArt report No changes today  2.  Persistent atrial fibrillation Burden by device interrogation today 73.3%.  Pt unable to tell difference between atrial arrhythmias and SR.  Continue Pradaxa for CHADS2VASC of 4  3.  CAD  No recent ischemic symptoms Continue medical therapy  4.  Aortic stenosis s/p TAVR Per Dr Angelena Form   Current medicines are reviewed at length with the patient today.   The patient does not have concerns regarding his medicines.  The following changes were made today:  none  Labs/ tests ordered today include: pre-procedure labs  No orders of the defined types were placed in this encounter.    Disposition:   Follow up with Dr Caryl Comes following generator change    Signed, Chanetta Marshall, NP 02/27/2016 12:02 PM  [place] [address] [place] [address] [phone] (office) [phone] (fax)

## 2016-02-27 ENCOUNTER — Encounter: Payer: Self-pay | Admitting: *Deleted

## 2016-02-27 ENCOUNTER — Ambulatory Visit (INDEPENDENT_AMBULATORY_CARE_PROVIDER_SITE_OTHER): Payer: Medicare Other | Admitting: Nurse Practitioner

## 2016-02-27 ENCOUNTER — Encounter: Payer: Self-pay | Admitting: Nurse Practitioner

## 2016-02-27 VITALS — BP 114/60 | HR 85 | Ht 66.0 in | Wt 157.8 lb

## 2016-02-27 DIAGNOSIS — I469 Cardiac arrest, cause unspecified: Secondary | ICD-10-CM | POA: Diagnosis not present

## 2016-02-27 DIAGNOSIS — Z8679 Personal history of other diseases of the circulatory system: Secondary | ICD-10-CM

## 2016-02-27 DIAGNOSIS — Z952 Presence of prosthetic heart valve: Secondary | ICD-10-CM | POA: Diagnosis not present

## 2016-02-27 DIAGNOSIS — I255 Ischemic cardiomyopathy: Secondary | ICD-10-CM

## 2016-02-27 LAB — CUP PACEART INCLINIC DEVICE CHECK
Date Time Interrogation Session: 20171120121124
Implantable Lead Implant Date: 20110601
Implantable Lead Location: 753860
Implantable Lead Model: 5076
MDC IDC LEAD IMPLANT DT: 20110601
MDC IDC LEAD LOCATION: 753859
MDC IDC PG IMPLANT DT: 20110601

## 2016-02-27 NOTE — Patient Instructions (Addendum)
Medication Instructions:   Your physician recommends that you continue on your current medications as directed. Please refer to the Current Medication list given to you today.   If you need a refill on your cardiac medications before your next appointment, please call your pharmacy.  Labwork: RETURN FOR LABS ON CBC AND BMET ON 03-14-16   Testing/Procedures: SEE LETTER FOR GEN CHANGE     Follow-Up: 10 DAY POST WOUND CHECK AFTER 12- 13-17                        [place]   WITH DR Caryl Comes  AFTER 12- 13 -17  Any Other Special Instructions Will Be Listed Below (If Applicable).

## 2016-03-02 ENCOUNTER — Encounter: Payer: Self-pay | Admitting: Family

## 2016-03-02 ENCOUNTER — Ambulatory Visit (INDEPENDENT_AMBULATORY_CARE_PROVIDER_SITE_OTHER): Payer: Medicare Other | Admitting: Family

## 2016-03-02 VITALS — BP 120/64 | HR 74 | Temp 98.3°F | Ht 66.0 in | Wt 151.0 lb

## 2016-03-02 DIAGNOSIS — J069 Acute upper respiratory infection, unspecified: Secondary | ICD-10-CM

## 2016-03-02 DIAGNOSIS — I255 Ischemic cardiomyopathy: Secondary | ICD-10-CM

## 2016-03-02 MED ORDER — LEVOFLOXACIN 500 MG PO TABS: 500.0000 mg | ORAL_TABLET | Freq: Every day | ORAL | 0 refills | Status: DC

## 2016-03-02 NOTE — Assessment & Plan Note (Signed)
Symptoms and exam consistent with acute upper respiratory infection most likely viral. Treat conservatively with over-the-counter medications as needed for symptom relief and supportive care. There is concern for potential development of pneumonia given history of multiple comorbidities. Written prescription for levofloxacin provided if symptoms worsen or do not improve.

## 2016-03-02 NOTE — Progress Notes (Signed)
Pre visit review using our clinic review tool, if applicable. No additional management support is needed unless otherwise documented below in the visit note. 

## 2016-03-02 NOTE — Progress Notes (Signed)
Subjective:    Patient ID: [name], male    DOB: 1933/06/11, 82 y.o.   MRN: [id number]  Chief Complaint  Patient presents with  . Flu Like Symptoms    Pt states that sx started on Wednesday, including fever and body ache.     HPI:  [name] is a 82 y.o. male who  has a past medical history of Aortic stenosis, severe; Arthritis; Cardiac arrest ([place]); Claudication [facility]); Coronary artery disease; Essential hypertension; Headache; History of hiatal hernia; Hyperlipidemia; Kidney stones; and Peripheral vascular disease ([place]). and presents today for an acute office visit.  This is a new problem. Associated symptoms of sore throat, body aches, head congestion, and green phlegm with cough. Fever of 101.7 last evening with a 99.2 this morning. Course of the symptoms has slowly worsened. Modifying factors include Tylenol and Mucinex which may have helped a little bit. No recent antibiotics. Generally feeling overall malaise.    Allergies  Allergen Reactions  . Cyclobenzaprine Hcl Other (See Comments)    Unspecified reaction pt doesn't know what reaction  . Tetracycline     Rash       Outpatient Medications Prior to Visit  Medication Sig Dispense Refill  . amLODipine (NORVASC) 5 MG tablet Take 1 tablet (5 mg total) by mouth daily. 90 tablet 3  . Coenzyme Q10 (CO Q 10 PO) Take 1 tablet by mouth daily.     . diazepam (VALIUM) 5 MG tablet TAKE 1 TABLET BY MOUTH DAILY AS NEEDED FOR ANXIETY. 30 tablet 1  . furosemide (LASIX) 40 MG tablet Take 1 tablet (40 mg total) by mouth daily. 90 tablet 3  . KRILL OIL PO Take 1 tablet by mouth daily.    Marland Kitchen latanoprost (XALATAN) 0.005 % ophthalmic solution Place 1 drop into both eyes at bedtime.     Marland Kitchen lisinopril (PRINIVIL,ZESTRIL) 20 MG tablet Take 1 tablet (20 mg total) by mouth daily. 90 tablet 1  . Magnesium Oxide (MAG-OXIDE PO) Take 1 tablet by mouth at bedtime.     . metoprolol (LOPRESSOR) 100 MG tablet TAKE 1/2 TABLET BY MOUTH EVERY  MORNING. 45 tablet 1  . metoprolol (LOPRESSOR) 50 MG tablet TAKE 1/2 TABLET BY MOUTH EVERY EVENING. 45 tablet 3  . Multiple Vitamin (MULTIVITAMIN) tablet Take 1 tablet by mouth at bedtime.     . nitroGLYCERIN (NITROSTAT) 0.4 MG SL tablet Place 1 tablet (0.4 mg total) under the tongue every 5 (five) minutes as needed for chest pain (for chest pain (MAX of 3 doses)). 25 tablet 6  . potassium chloride (K-DUR,KLOR-CON) 10 MEQ tablet Take 10 mEq by mouth 2 (two) times daily.      Marland Kitchen PRADAXA 150 MG CAPS capsule TAKE 1 CAPSULE BY MOUTH TWICE DAILY 60 capsule 7  . rosuvastatin (CRESTOR) 20 MG tablet Take 1 tablet (20 mg total) by mouth daily. 90 tablet 3  . sildenafil (REVATIO) 20 MG tablet Take 2-3 tablets by mouth daily as needed 30 tablet 1  . Thiamine HCl (VITAMIN B-1) 100 MG tablet Take 100 mg by mouth daily.       No facility-administered medications prior to visit.       Past Surgical History:  Procedure Laterality Date  . ADENOIDECTOMY    . APPENDECTOMY    . CARDIAC CATHETERIZATION   11/16/2009   . CARDIAC DEFIBRILLATOR PLACEMENT    . cataract surgery      Bilateral cataract surgery  . CORONARY ARTERY BYPASS GRAFT  in 1982 and '84 with subsequent PTCI  . HERNIA REPAIR    . LEFT AND RIGHT HEART CATHETERIZATION WITH CORONARY/GRAFT ANGIOGRAM N/A 02/01/2014   Procedure: LEFT AND RIGHT HEART CATHETERIZATION WITH [name];  Surgeon: [name];  Location: [facility] CATH LAB;  Service: Cardiovascular;  Laterality: N/A;  . left elbow tendon surgery    . TEE WITHOUT CARDIOVERSION N/A 03/23/2014   Procedure: TRANSESOPHAGEAL ECHOCARDIOGRAM (TEE);  Surgeon: [name];  Location: [place];  Service: Open Heart Surgery;  Laterality: N/A;  . TONSILLECTOMY    . TRANSCATHETER AORTIC VALVE REPLACEMENT, TRANSFEMORAL N/A 03/23/2014   Procedure: TRANSCATHETER AORTIC VALVE REPLACEMENT, TRANSFEMORAL;  Surgeon: [name];  Location: [place];  Service: Open  Heart Surgery;  Laterality: N/A;      Past Medical History:  Diagnosis Date  . Aortic stenosis, severe    s/p TAVR with Edwards Sapien 3 THV (size 26 mm, model # 9600TFX, serial # [id number])  . Arthritis   . Cardiac arrest ([place])   . Claudication ([place])   . Coronary artery disease   . Essential hypertension   . Headache   . History of hiatal hernia   . Hyperlipidemia   . Kidney stones   . Peripheral vascular disease ([place])       Review of Systems  Constitutional: Positive for fever. Negative for chills.  HENT: Positive for congestion and sore throat. Negative for ear pain, sinus pain and sinus pressure.   Respiratory: Positive for cough. Negative for shortness of breath and wheezing.   Cardiovascular: Negative for chest pain.  Neurological: Positive for headaches.      Objective:    BP 120/64 (BP Location: Left Arm, Patient Position: Sitting, Cuff Size: Normal)   Pulse 74   Temp 98.3 F (36.8 C) (Oral)   Ht 5\' 6"  (1.676 m)   Wt 151 lb (68.5 kg)   SpO2 96%   BMI 24.37 kg/m  Nursing note and vital signs reviewed.  Physical Exam  Constitutional: He is oriented to person, place, and time. He appears well-developed and well-nourished.  Non-toxic appearance. He does not have a sickly appearance. He does not appear ill. No distress.  HENT:  Right Ear: Hearing, tympanic membrane, external ear and ear canal normal.  Left Ear: Hearing, tympanic membrane, external ear and ear canal normal.  Nose: Nose normal.  Mouth/Throat: Uvula is midline, oropharynx is clear and moist and mucous membranes are normal.  Cardiovascular: Normal rate, regular rhythm, normal heart sounds and intact distal pulses.   Pulmonary/Chest: Effort normal and breath sounds normal.  Neurological: He is alert and oriented to person, place, and time.  Skin: Skin is warm and dry.  Psychiatric: He has a normal mood and affect. His behavior is normal. Judgment and thought content normal.       Assessment &  Plan:   Problem List Items Addressed This Visit      Respiratory   Acute upper respiratory infection - Primary    Symptoms and exam consistent with acute upper respiratory infection most likely viral. Treat conservatively with over-the-counter medications as needed for symptom relief and supportive care. There is concern for potential development of pneumonia given history of multiple comorbidities. Written prescription for levofloxacin provided if symptoms worsen or do not improve.           I am having Mr. Stavros start on levofloxacin. I am also having him maintain his multivitamin, potassium chloride, thiamine, latanoprost, Coenzyme Q10 (CO  Q 10 PO), KRILL OIL PO, Magnesium Oxide (MAG-OXIDE PO), lisinopril, nitroGLYCERIN, metoprolol, rosuvastatin, PRADAXA, amLODipine, sildenafil, diazepam, furosemide, and metoprolol.   Meds ordered this encounter  Medications  . levofloxacin (LEVAQUIN) 500 MG tablet    Sig: Take 1 tablet (500 mg total) by mouth daily.    Dispense:  7 tablet    Refill:  0    Order Specific Question:   Supervising Provider    Answer:   Pricilla Holm A [id number]     Follow-up: Return if symptoms worsen or fail to improve.  Mauricio Po, FNP

## 2016-03-02 NOTE — Patient Instructions (Signed)
Thank you for choosing [facility].  SUMMARY AND INSTRUCTIONS:  Medication:  If your symptoms do not improve start the levofloxacin.  Your prescription(s) have been submitted to your pharmacy or been printed and provided for you. Please take as directed and contact our office if you believe you are having problem(s) with the medication(s) or have any questions.   Follow up:  If your symptoms worsen or fail to improve, please contact our office for further instruction, or in case of emergency go directly to the emergency room at the closest medical facility.   General Recommendations:    Please drink plenty of fluids.  Get plenty of rest   Sleep in humidified air  Use saline nasal sprays  Netti pot   OTC Medications:  Decongestants - helps relieve congestion   Flonase (generic fluticasone) or Nasacort (generic triamcinolone) - please make sure to use the "cross-over" technique at a 45 degree angle towards the opposite eye as opposed to straight up the nasal passageway.   Sudafed (generic pseudoephedrine - Note this is the one that is available behind the pharmacy counter); Products with phenylephrine (-PE) may also be used but is often not as effective as pseudoephedrine.   If you have HIGH BLOOD PRESSURE - Coricidin HBP; AVOID any product that is -D as this contains pseudoephedrine which may increase your blood pressure.  Afrin (oxymetazoline) every 6-8 hours for up to 3 days.   Allergies - helps relieve runny nose, itchy eyes and sneezing   Claritin (generic loratidine), Allegra (fexofenidine), or Zyrtec (generic cyrterizine) for runny nose. These medications should not cause drowsiness.  Note - Benadryl (generic diphenhydramine) may be used however may cause drowsiness  Cough -   Delsym or Robitussin (generic dextromethorphan)  Expectorants - helps loosen mucus to ease removal   Mucinex (generic guaifenesin) as directed on the package.  Headaches /  General Aches   Tylenol (generic acetaminophen) - DO NOT EXCEED 3 grams (3,000 mg) in a 24 hour time period  Advil/Motrin (generic ibuprofen)   Sore Throat -   Salt water gargle   Chloraseptic (generic benzocaine) spray or lozenges / Sucrets (generic dyclonine)

## 2016-03-14 ENCOUNTER — Other Ambulatory Visit (INDEPENDENT_AMBULATORY_CARE_PROVIDER_SITE_OTHER): Payer: Medicare Other

## 2016-03-14 DIAGNOSIS — I469 Cardiac arrest, cause unspecified: Secondary | ICD-10-CM

## 2016-03-14 LAB — CBC
HCT: 44.8 % (ref 38.5–50.0)
HEMOGLOBIN: 15 g/dL (ref 13.2–17.1)
MCH: 31.6 pg (ref 27.0–33.0)
MCHC: 33.5 g/dL (ref 32.0–36.0)
MCV: 94.3 fL (ref 80.0–100.0)
MPV: 11 fL (ref 7.5–12.5)
PLATELETS: 152 10*3/uL (ref 140–400)
RBC: 4.75 MIL/uL (ref 4.20–5.80)
RDW: 13.7 % (ref 11.0–15.0)
WBC: 6.4 10*3/uL (ref 3.8–10.8)

## 2016-03-14 LAB — BASIC METABOLIC PANEL
BUN: 21 mg/dL (ref 7–25)
CALCIUM: 9.8 mg/dL (ref 8.6–10.3)
CHLORIDE: 106 mmol/L (ref 98–110)
CO2: 24 mmol/L (ref 20–31)
CREATININE: 0.93 mg/dL (ref 0.70–1.11)
Glucose, Bld: 98 mg/dL (ref 65–99)
Potassium: 4.4 mmol/L (ref 3.5–5.3)
SODIUM: 140 mmol/L (ref 135–146)

## 2016-03-16 ENCOUNTER — Telehealth: Payer: Self-pay | Admitting: *Deleted

## 2016-03-16 NOTE — Telephone Encounter (Signed)
LEFT CONTACT NUMBER WITH RECEPTIONIST  BACK  FOR THE CLINIC TO CALL BACK FOR RESULTS

## 2016-03-16 NOTE — Telephone Encounter (Signed)
-----   Message from Patsey Berthold, NP sent at 03/14/2016  7:22 PM EST ----- Please notify patient of stable labs. Thanks!

## 2016-03-16 NOTE — Telephone Encounter (Signed)
Follow Up:; ° ° °Returning your call. °

## 2016-03-16 NOTE — Telephone Encounter (Signed)
SPOKE TO PT ABOUT RESULTS 

## 2016-03-19 NOTE — Telephone Encounter (Signed)
MESSAGE LEFT WITH DAUGHTER TO CONTACT OFFICE BACK WITH LETTER SENT HOME FROM [facility] WITH INSTRUCTIONS

## 2016-03-19 NOTE — Telephone Encounter (Signed)
Follow up     Pt is calling to see if he can take his medications prior to his 03-21-16 procedure.  He recently saw Chanetta Marshall and forgot to ask

## 2016-03-21 ENCOUNTER — Ambulatory Visit (HOSPITAL_COMMUNITY)
Admission: RE | Admit: 2016-03-21 | Discharge: 2016-03-21 | Disposition: A | Discharge status: Home / Self Care | Payer: Medicare Other | Source: Ambulatory Visit | Attending: Internal Medicine | Admitting: Internal Medicine

## 2016-03-21 ENCOUNTER — Encounter (HOSPITAL_COMMUNITY)
Admission: RE | Disposition: A | Discharge status: Home / Self Care | Payer: Self-pay | Source: Ambulatory Visit | Attending: Internal Medicine

## 2016-03-21 DIAGNOSIS — E785 Hyperlipidemia, unspecified: Secondary | ICD-10-CM | POA: Diagnosis not present

## 2016-03-21 DIAGNOSIS — I739 Peripheral vascular disease, unspecified: Secondary | ICD-10-CM | POA: Diagnosis not present

## 2016-03-21 DIAGNOSIS — Z87442 Personal history of urinary calculi: Secondary | ICD-10-CM | POA: Insufficient documentation

## 2016-03-21 DIAGNOSIS — Z87891 Personal history of nicotine dependence: Secondary | ICD-10-CM | POA: Insufficient documentation

## 2016-03-21 DIAGNOSIS — Z881 Allergy status to other antibiotic agents status: Secondary | ICD-10-CM | POA: Insufficient documentation

## 2016-03-21 DIAGNOSIS — Z4502 Encounter for adjustment and management of automatic implantable cardiac defibrillator: Secondary | ICD-10-CM | POA: Diagnosis not present

## 2016-03-21 DIAGNOSIS — Z952 Presence of prosthetic heart valve: Secondary | ICD-10-CM | POA: Diagnosis not present

## 2016-03-21 DIAGNOSIS — I481 Persistent atrial fibrillation: Secondary | ICD-10-CM | POA: Insufficient documentation

## 2016-03-21 DIAGNOSIS — I10 Essential (primary) hypertension: Secondary | ICD-10-CM | POA: Insufficient documentation

## 2016-03-21 DIAGNOSIS — Z95810 Presence of automatic (implantable) cardiac defibrillator: Secondary | ICD-10-CM | POA: Diagnosis present

## 2016-03-21 DIAGNOSIS — Z951 Presence of aortocoronary bypass graft: Secondary | ICD-10-CM | POA: Insufficient documentation

## 2016-03-21 DIAGNOSIS — Z452 Encounter for adjustment and management of vascular access device: Secondary | ICD-10-CM | POA: Insufficient documentation

## 2016-03-21 DIAGNOSIS — I2510 Atherosclerotic heart disease of native coronary artery without angina pectoris: Secondary | ICD-10-CM | POA: Diagnosis not present

## 2016-03-21 DIAGNOSIS — I469 Cardiac arrest, cause unspecified: Secondary | ICD-10-CM | POA: Diagnosis present

## 2016-03-21 DIAGNOSIS — Z8249 Family history of ischemic heart disease and other diseases of the circulatory system: Secondary | ICD-10-CM | POA: Diagnosis not present

## 2016-03-21 DIAGNOSIS — Z7901 Long term (current) use of anticoagulants: Secondary | ICD-10-CM | POA: Insufficient documentation

## 2016-03-21 HISTORY — PX: EP IMPLANTABLE DEVICE: SHX172B

## 2016-03-21 LAB — SURGICAL PCR SCREEN
MRSA, PCR: NEGATIVE
STAPHYLOCOCCUS AUREUS: NEGATIVE

## 2016-03-21 SURGERY — ICD GENERATOR CHANGEOUT

## 2016-03-21 MED ORDER — MIDAZOLAM HCL 5 MG/5ML IJ SOLN
INTRAMUSCULAR | Status: DC | PRN
  Administered 2016-03-21 (×2): 1 mg via INTRAVENOUS

## 2016-03-21 MED ORDER — MUPIROCIN 2 % EX OINT
TOPICAL_OINTMENT | CUTANEOUS | Status: AC
  Administered 2016-03-21: 1 via TOPICAL
  Filled 2016-03-21: qty 22

## 2016-03-21 MED ORDER — MIDAZOLAM HCL 5 MG/5ML IJ SOLN
INTRAMUSCULAR | Status: AC
  Filled 2016-03-21: qty 5

## 2016-03-21 MED ORDER — MUPIROCIN 2 % EX OINT
1.0000 "application " | TOPICAL_OINTMENT | Freq: Once | CUTANEOUS | Status: AC
  Administered 2016-03-21: 1 via TOPICAL

## 2016-03-21 MED ORDER — FENTANYL CITRATE (PF) 100 MCG/2ML IJ SOLN
INTRAMUSCULAR | Status: DC | PRN
  Administered 2016-03-21: 25 ug via INTRAVENOUS

## 2016-03-21 MED ORDER — SODIUM CHLORIDE 0.9 % IV SOLN: INTRAVENOUS | Status: AC

## 2016-03-21 MED ORDER — CEFAZOLIN SODIUM-DEXTROSE 2-4 GM/100ML-% IV SOLN
INTRAVENOUS | Status: AC
  Filled 2016-03-21: qty 100

## 2016-03-21 MED ORDER — CHLORHEXIDINE GLUCONATE 4 % EX LIQD: 60.0000 mL | Freq: Once | CUTANEOUS | Status: DC

## 2016-03-21 MED ORDER — ACETAMINOPHEN 325 MG PO TABS
325.0000 mg | ORAL_TABLET | ORAL | Status: DC | PRN
  Filled 2016-03-21: qty 2

## 2016-03-21 MED ORDER — CEFAZOLIN SODIUM-DEXTROSE 2-4 GM/100ML-% IV SOLN
2.0000 g | INTRAVENOUS | Status: AC
  Administered 2016-03-21: 2 g via INTRAVENOUS

## 2016-03-21 MED ORDER — ONDANSETRON HCL 4 MG/2ML IJ SOLN
4.0000 mg | Freq: Four times a day (QID) | INTRAMUSCULAR | Status: DC | PRN
Start: 2016-03-21 — End: 2016-03-21

## 2016-03-21 MED ORDER — SODIUM CHLORIDE 0.9 % IV SOLN
INTRAVENOUS | Status: DC
  Administered 2016-03-21: 10:00:00 via INTRAVENOUS

## 2016-03-21 MED ORDER — FENTANYL CITRATE (PF) 100 MCG/2ML IJ SOLN
INTRAMUSCULAR | Status: AC
  Filled 2016-03-21: qty 2

## 2016-03-21 MED ORDER — LIDOCAINE HCL (PF) 2 % IJ SOLN
INTRAMUSCULAR | Status: DC | PRN
Start: 2016-03-21 — End: 2016-03-21
  Administered 2016-03-21: 45 mL

## 2016-03-21 MED ORDER — LIDOCAINE HCL (PF) 1 % IJ SOLN
INTRAMUSCULAR | Status: AC
  Filled 2016-03-21: qty 60

## 2016-03-21 MED ORDER — SODIUM CHLORIDE 0.9 % IR SOLN
Status: AC
  Filled 2016-03-21: qty 2

## 2016-03-21 MED ORDER — GENTAMICIN SULFATE 40 MG/ML IJ SOLN
80.0000 mg | INTRAMUSCULAR | Status: AC
  Administered 2016-03-21: 80 mg

## 2016-03-21 SURGICAL SUPPLY — 6 items
CABLE SURGICAL S-101-97-12 (CABLE) ×1 IMPLANT
HEMOSTAT SURGICEL 2X4 FIBR (HEMOSTASIS) ×2 IMPLANT
ICD EVERA XT MRI DF1  DDMB1D1 (ICD Generator) ×1 IMPLANT
ICD EVERA XT MRI DF1 DDMB1D1 (ICD Generator) IMPLANT
PAD DEFIB LIFELINK (PAD) ×1 IMPLANT
TRAY PACEMAKER INSERTION (CUSTOM PROCEDURE TRAY) ×1 IMPLANT

## 2016-03-21 NOTE — Interval H&P Note (Signed)
ICD Criteria  Current LVEF:55%. Within 12 months prior to implant: Yes   Heart failure history: Yes, Class II  Cardiomyopathy history: No.  Atrial Fibrillation/Atrial Flutter: Yes, Persistent (> 7 Days).  Ventricular tachycardia history: Yes, Hemodynamic instability present. VT Type: Sustained Ventricular Tachycardia - Polymorphic.  Cardiac arrest history: Yes, Ventricular Fibrillation.  History of syndromes with risk of sudden death: No.  Previous ICD: Yes, Reason for ICD:  Secondary prevention.  Current ICD indication: Secondary  PPM indication: No.   Class I or II Bradycardia indication present: No  Beta Blocker therapy for 3 or more months: Yes, prescribed.   Ace Inhibitor/ARB therapy for 3 or more months: Yes, prescribed.   History and Physical Interval Note:  03/21/2016 8:39 AM  [name]  has presented today for surgery, with the diagnosis of cardiac arrest  The various methods of treatment have been discussed with the patient and family. After consideration of risks, benefits and other options for treatment, the patient has consented to  Procedure(s): ICD [employer] (N/A) as a surgical intervention .  The patient's history has been reviewed, patient examined, no change in status, stable for surgery.  I have reviewed the patient's chart and labs.  Questions were answered to the patient's satisfaction.     Virl Axe

## 2016-03-21 NOTE — H&P (View-Only) (Signed)
Electrophysiology Office Note Date: 02/27/2016  ID:  [name], DOB [birth year], MRN [id number]  PCP: Binnie Rail, MD Primary Cardiologist: Mare Ferrari ->[place] Electrophysiologist: Caryl Comes  CC: ICD at [place] is a 82 y.o. male seen today for Dr Caryl Comes.  He presents today for routine EP follow up. Since last being seen in our clinic, the patient reports doing reasonably well. His ICD has recently been found to be at [facility] by remote interrogation.  He remains active traveling and playing golf.  He denies chest pain, palpitations, dyspnea, PND, orthopnea, nausea, vomiting, dizziness, syncope, edema, weight gain, or early satiety.  He has not had ICD shocks.   Device History: MDT dual chamber ICD implanted 2011 for secondary prevention  History of appropriate therapy: Yes History of AAD therapy: No   Past Medical History:  Diagnosis Date  . Aortic stenosis, severe    s/p TAVR with Edwards Sapien 3 THV (size 26 mm, model # 9600TFX, serial # [id number])  . Arthritis   . Cardiac arrest ([place])   . Claudication ([place])   . Coronary artery disease   . Essential hypertension   . Headache   . History of hiatal hernia   . Hyperlipidemia   . Kidney stones   . Peripheral vascular disease [facility])    Past Surgical History:  Procedure Laterality Date  . ADENOIDECTOMY    . APPENDECTOMY    . CARDIAC CATHETERIZATION   11/16/2009   . CARDIAC DEFIBRILLATOR PLACEMENT    . cataract surgery      Bilateral cataract surgery  . CORONARY ARTERY BYPASS GRAFT     in 1982 and '84 with subsequent PTCI  . HERNIA REPAIR    . LEFT AND RIGHT HEART CATHETERIZATION WITH CORONARY/GRAFT ANGIOGRAM N/A 02/01/2014   Procedure: LEFT AND RIGHT HEART CATHETERIZATION WITH Beatrix Fetters;  Surgeon: Sinclair Grooms, MD;  Location: [facility] CATH LAB;  Service: Cardiovascular;  Laterality: N/A;  . left elbow tendon surgery    . TEE WITHOUT CARDIOVERSION N/A 03/23/2014   Procedure: TRANSESOPHAGEAL  ECHOCARDIOGRAM (TEE);  Surgeon: Burnell Blanks, MD;  Location: [place];  Service: Open Heart Surgery;  Laterality: N/A;  . TONSILLECTOMY    . TRANSCATHETER AORTIC VALVE REPLACEMENT, TRANSFEMORAL N/A 03/23/2014   Procedure: TRANSCATHETER AORTIC VALVE REPLACEMENT, TRANSFEMORAL;  Surgeon: Burnell Blanks, MD;  Location: [place];  Service: Open Heart Surgery;  Laterality: N/A;    Current Outpatient Prescriptions  Medication Sig Dispense Refill  . amLODipine (NORVASC) 5 MG tablet Take 1 tablet (5 mg total) by mouth daily. 90 tablet 3  . Coenzyme Q10 (CO Q 10 PO) Take 1 tablet by mouth daily.     . diazepam (VALIUM) 5 MG tablet TAKE 1 TABLET BY MOUTH DAILY AS NEEDED FOR ANXIETY. 30 tablet 1  . furosemide (LASIX) 40 MG tablet Take 1 tablet (40 mg total) by mouth daily. 90 tablet 3  . KRILL OIL PO Take 1 tablet by mouth daily.    Marland Kitchen latanoprost (XALATAN) 0.005 % ophthalmic solution Place 1 drop into both eyes at bedtime.     Marland Kitchen lisinopril (PRINIVIL,ZESTRIL) 20 MG tablet Take 1 tablet (20 mg total) by mouth daily. 90 tablet 1  . Magnesium Oxide (MAG-OXIDE PO) Take 1 tablet by mouth at bedtime.     . metoprolol (LOPRESSOR) 100 MG tablet TAKE 1/2 TABLET BY MOUTH EVERY MORNING. 45 tablet 1  . metoprolol (LOPRESSOR) 50 MG tablet TAKE 1/2 TABLET BY MOUTH EVERY EVENING.  45 tablet 3  . Multiple Vitamin (MULTIVITAMIN) tablet Take 1 tablet by mouth at bedtime.     . nitroGLYCERIN (NITROSTAT) 0.4 MG SL tablet Place 1 tablet (0.4 mg total) under the tongue every 5 (five) minutes as needed for chest pain (for chest pain (MAX of 3 doses)). 25 tablet 6  . potassium chloride (K-DUR,KLOR-CON) 10 MEQ tablet Take 10 mEq by mouth 2 (two) times daily.      Marland Kitchen PRADAXA 150 MG CAPS capsule TAKE 1 CAPSULE BY MOUTH TWICE DAILY 60 capsule 7  . rosuvastatin (CRESTOR) 20 MG tablet Take 1 tablet (20 mg total) by mouth daily. 90 tablet 3  . sildenafil (REVATIO) 20 MG tablet Take 2-3 tablets by mouth daily as needed 30  tablet 1  . Thiamine HCl (VITAMIN B-1) 100 MG tablet Take 100 mg by mouth daily.       No current facility-administered medications for this visit.     Allergies:   Cyclobenzaprine hcl and Tetracycline   Social History: Social History   Social History  . Marital status: Married    Spouse name: N/A  . Number of children: 3  . Years of education: N/A   Occupational History  . Retired    Social History Main Topics  . Smoking status: Former Smoker    Quit date: 10/25/1952  . Smokeless tobacco: Never Used  . Alcohol use 3.0 - 3.6 oz/week    5 - 6 Glasses of wine per week     Comment: 5-6 times a week  . Drug use: No  . Sexual activity: Not on file   Other Topics Concern  . Not on file   Social History Narrative  . No narrative on file    Family History: Family History  Problem Relation Age of Onset  . Heart attack Father 76  . Coronary artery disease Other     family history is dignificant for early CAD in several members    Review of Systems: All other systems reviewed and are otherwise negative except as noted above.   Physical Exam: VS:  BP 114/60   Pulse 85   Ht 5\' 6"  (1.676 m)   Wt 157 lb 12.8 oz (71.6 kg)   SpO2 98%   BMI 25.47 kg/m  , BMI Body mass index is 25.47 kg/m.  GEN- The patient is elderly appearing, alert and oriented x 3 today.   HEENT: normocephalic, atraumatic; sclera clear, conjunctiva pink; hearing intact; oropharynx clear; neck supple Lungs- Clear to ausculation bilaterally, normal work of breathing.  No wheezes, rales, rhonchi Heart- Regular rate and rhythm (paced) GI- soft, non-tender, non-distended, bowel sounds present Extremities- no clubbing, cyanosis, or edema; DP/PT/radial pulses 2+ bilaterally MS- no significant deformity or atrophy Skin- warm and dry, no rash or lesion; ICD pocket well healed Psych- euthymic mood, full affect Neuro- strength and sensation are intact  ICD interrogation- reviewed in detail today,  See  PACEART report  EKG:  EKG is not ordered today.  Recent Labs: 11/17/2015: Hemoglobin 13.0 12/05/2015: ALT 12; BUN 21; Creat 0.99; Potassium 4.4; Sodium 141   Wt Readings from Last 3 Encounters:  02/27/16 157 lb 12.8 oz (71.6 kg)  12/07/15 155 lb 9.6 oz (70.6 kg)  12/01/15 153 lb 7 oz (69.6 kg)     Other studies Reviewed: Additional studies/ records that were reviewed today include: Dr Caryl Comes and Dr Camillia Herter office notes  Assessment and Plan:  1.  Prior cardiac arrest  ICD at [facility].  Risks,  benefits to generator change discussed with the patient who wishes to proceed. Will plan with Dr Caryl Comes at next available time. Hold Pradaxa for 2 doses prior to procedure.  See PaceArt report No changes today  2.  Persistent atrial fibrillation Burden by device interrogation today 73.3%.  Pt unable to tell difference between atrial arrhythmias and SR.  Continue Pradaxa for CHADS2VASC of 4  3.  CAD  No recent ischemic symptoms Continue medical therapy  4.  Aortic stenosis s/p TAVR Per Dr Angelena Form   Current medicines are reviewed at length with the patient today.   The patient does not have concerns regarding his medicines.  The following changes were made today:  none  Labs/ tests ordered today include: pre-procedure labs  No orders of the defined types were placed in this encounter.    Disposition:   Follow up with Dr Caryl Comes following generator change    Signed, Carlos Marshall, NP 02/27/2016 12:02 PM  [address] [place] [phone] (office) [phone] (fax)

## 2016-03-21 NOTE — Discharge Instructions (Signed)
Pacemaker Battery Change, Care After °Refer to this sheet in the next few weeks. These instructions provide you with information on caring for yourself after your procedure. Your health care provider may also give you more specific instructions. Your treatment has been planned according to current medical practices, but problems sometimes occur. Call your health care provider if you have any problems or questions after your procedure. °WHAT TO EXPECT AFTER THE PROCEDURE °After your procedure, it is typical to have the following sensations: °· Soreness at the pacemaker site. °HOME CARE INSTRUCTIONS  °· Keep the incision clean and dry. °· Unless advised otherwise, you may shower beginning 48 hours after your procedure. °· For the first week after the replacement, avoid stretching motions that pull at the incision site, and avoid heavy exercise with the arm that is on the same side as the incision. °· Take medicines only as directed by your health care provider. °· Keep all follow-up visits as directed by your health care provider. °SEEK MEDICAL CARE IF:  °· You have pain at the incision site that is not relieved by over-the-counter or prescription medicine. °· There is drainage or pus from the incision site. °· There is swelling larger than a lime at the incision site. °· You develop red streaking that extends above or below the incision site. °· You feel brief, intermittent palpitations, light-headedness, or any symptoms that you feel might be related to your heart. °SEEK IMMEDIATE MEDICAL CARE IF:  °· You experience chest pain that is different than the pain at the pacemaker site. °· You experience shortness of breath. °· You have palpitations or irregular heartbeat. °· You have light-headedness that does not go away quickly. °· You faint. °· You have pain that gets worse and is not relieved by medicine. °This information is not intended to replace advice given to you by your health care provider. Make sure you  discuss any questions you have with your health care provider. °Document Released: 01/14/2013 Document Revised: 04/16/2014 Document Reviewed: 01/14/2013 °Elsevier Interactive Patient Education © 2017 Elsevier Inc. ° °

## 2016-03-22 ENCOUNTER — Encounter (HOSPITAL_COMMUNITY): Payer: Self-pay | Admitting: Internal Medicine

## 2016-03-28 ENCOUNTER — Encounter: Payer: Medicare Other | Admitting: Internal Medicine

## 2016-03-28 ENCOUNTER — Other Ambulatory Visit: Payer: Self-pay | Admitting: Internal Medicine

## 2016-03-28 DIAGNOSIS — F419 Anxiety disorder, unspecified: Secondary | ICD-10-CM

## 2016-03-28 NOTE — Telephone Encounter (Signed)
Called refill into pharmacy spoke w/Tressa gave md approval. Updated epic...[name]

## 2016-03-28 NOTE — Telephone Encounter (Signed)
[place] controlled substance database checked.  Ok to fill medication.  

## 2016-04-04 ENCOUNTER — Ambulatory Visit (INDEPENDENT_AMBULATORY_CARE_PROVIDER_SITE_OTHER): Payer: Medicare Other | Admitting: *Deleted

## 2016-04-04 DIAGNOSIS — Z95810 Presence of automatic (implantable) cardiac defibrillator: Secondary | ICD-10-CM

## 2016-04-04 NOTE — Progress Notes (Signed)
Wound check appointment. Dermabond removed. Wound without redness or edema. Incision edges approximated, wound well healed. Normal device function. Thresholds, sensing, and impedances consistent with implant measurements. Device programmed at chronic outputs w/ adaptive. Histogram distribution appropriate for patient and level of activity. 75% AT/AF + Pradaxa. No ventricular arrhythmias noted. Patient educated about wound care, arm mobility, shock plan. ROV in 06/21/2016 w/ SK

## 2016-04-16 NOTE — Progress Notes (Addendum)
Subjective:   [name] is a 83 y.o. male who presents for an Initial Medicare Annual Wellness Visit.  The Patient was informed that the wellness visit is to identify future health risk and educate and initiate measures that can reduce risk for increased disease through the lifespan.   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   Review of Systems  No ROS.  Medicare Wellness Visit.  Cardiac Risk Factors include: advanced age (>69mn, >>34women);dyslipidemia   Sleep patterns: Sleeps 7 hours, up to void x 1.  Home Safety/Smoke Alarms:  Smoke detectors and security in place.  Living environment; residence and Firearm Safety: Lives with wife and daughter in 2 story home, no problem stairs. No firearms.  Seat Belt Safety/Bike Helmet: Wears seat belt.   Counseling:   Eye Exam-Last exam 09/2015, yearly by Dr.  AMarcene Brawn(daughter)  Dental-Last exam 09/2015, followed yearly.   Male:   CCS-Patient reports colonoscopy about 10 years ago, reports normal. Request Cologuard kit. Order placed.  PSA-11/21/2015, 4.900. Followed by Dr. WJeffie Pollock    Objective:    Today's Vitals   04/17/16 1409  BP: 124/60  Pulse: 65  SpO2: 96%  Weight: 158 lb (71.7 kg)  Height: 5' 6"  (1.676 m)   Body mass index is 25.5 kg/m.  Current Medications (verified) Outpatient Encounter Prescriptions as of 04/17/2016  Medication Sig  . acetaminophen (TYLENOL) 325 MG tablet Take 650 mg by mouth daily as needed for moderate pain or headache.  .Marland KitchenamLODipine (NORVASC) 5 MG tablet Take 1 tablet (5 mg total) by mouth daily.  . calcium carbonate (TUMS - DOSED IN MG ELEMENTAL CALCIUM) 500 MG chewable tablet Chew 2 tablets by mouth daily as needed for indigestion or heartburn.  . Coenzyme Q10 (CO Q 10 PO) Take 1 tablet by mouth daily.   . diazepam (VALIUM) 5 MG tablet TAKE 1 TABLET BY MOUTH DAILY AS NEEDED FOR ANXIETY.  . furosemide (LASIX) 40 MG tablet Take 1  tablet (40 mg total) by mouth daily.  .Marland KitchenKRILL OIL PO Take 1 tablet by mouth daily.  .Marland Kitchenlatanoprost (XALATAN) 0.005 % ophthalmic solution Place 1 drop into both eyes at bedtime.   .Marland Kitchenlisinopril (PRINIVIL,ZESTRIL) 20 MG tablet Take 1 tablet (20 mg total) by mouth daily.  . Magnesium Oxide (MAG-OXIDE PO) Take 1 tablet by mouth at bedtime.   . metoprolol (LOPRESSOR) 100 MG tablet TAKE 1/2 TABLET BY MOUTH EVERY MORNING.  . metoprolol (LOPRESSOR) 50 MG tablet TAKE 1/2 TABLET BY MOUTH EVERY EVENING.  . Multiple Vitamin (MULTIVITAMIN) tablet Take 1 tablet by mouth at bedtime.   . naphazoline-pheniramine (NAPHCON-A) 0.025-0.3 % ophthalmic solution Place 1 drop into both eyes daily as needed for irritation.  . nitroGLYCERIN (NITROSTAT) 0.4 MG SL tablet Place 1 tablet (0.4 mg total) under the tongue every 5 (five) minutes as needed for chest pain (for chest pain (MAX of 3 doses)).  .Marland Kitchenpotassium chloride (K-DUR,KLOR-CON) 10 MEQ tablet Take 10 mEq by mouth 2 (two) times daily.    .Marland KitchenPRADAXA 150 MG CAPS capsule TAKE 1 CAPSULE BY MOUTH TWICE DAILY  . rosuvastatin (CRESTOR) 20 MG tablet Take 1 tablet (20 mg total) by mouth daily. (Patient taking differently: Take 20 mg by mouth every evening. )  . sildenafil (REVATIO) 20 MG tablet Take 2-3 tablets by mouth daily as needed  . Thiamine HCl (VITAMIN B-1) 100 MG tablet Take 100 mg by mouth daily.    .Marland Kitchen  levofloxacin (LEVAQUIN) 500 MG tablet Take 1 tablet (500 mg total) by mouth daily. (Patient not taking: Reported on 04/17/2016)   No facility-administered encounter medications on file as of 04/17/2016.     Allergies (verified) Cyclobenzaprine hcl and Tetracycline   History: Past Medical History:  Diagnosis Date  . Aortic stenosis, severe    s/p TAVR with Edwards Sapien 3 THV (size 26 mm, model # 9600TFX, serial # [id number])  . Arthritis   . Cardiac arrest ([place])   . Claudication ([place])   . Coronary artery disease   . Essential hypertension   . Headache   . History  of hiatal hernia   . Hyperlipidemia   . Kidney stones   . Peripheral vascular disease [facility])    Past Surgical History:  Procedure Laterality Date  . ADENOIDECTOMY    . APPENDECTOMY    . CARDIAC CATHETERIZATION   11/16/2009   . CARDIAC DEFIBRILLATOR PLACEMENT    . cataract surgery      Bilateral cataract surgery  . CORONARY ARTERY BYPASS GRAFT     in 1982 and '84 with subsequent PTCI  . EP IMPLANTABLE DEVICE N/A 03/21/2016   Procedure: ICD Generator Changeout;  Surgeon: Deboraha Sprang, MD;  Location: [place] CV LAB;  Service: Cardiovascular;  Laterality: N/A;  . HERNIA REPAIR    . LEFT AND RIGHT HEART CATHETERIZATION WITH CORONARY/GRAFT ANGIOGRAM N/A 02/01/2014   Procedure: LEFT AND RIGHT HEART CATHETERIZATION WITH Beatrix Fetters;  Surgeon: Sinclair Grooms, MD;  Location: [facility] CATH LAB;  Service: Cardiovascular;  Laterality: N/A;  . left elbow tendon surgery    . TEE WITHOUT CARDIOVERSION N/A 03/23/2014   Procedure: TRANSESOPHAGEAL ECHOCARDIOGRAM (TEE);  Surgeon: Burnell Blanks, MD;  Location: [place];  Service: Open Heart Surgery;  Laterality: N/A;  . TONSILLECTOMY    . TRANSCATHETER AORTIC VALVE REPLACEMENT, TRANSFEMORAL N/A 03/23/2014   Procedure: TRANSCATHETER AORTIC VALVE REPLACEMENT, TRANSFEMORAL;  Surgeon: Burnell Blanks, MD;  Location: [place];  Service: Open Heart Surgery;  Laterality: N/A;   Family History  Problem Relation Age of Onset  . Heart attack Father 62  . Coronary artery disease Other     family history is dignificant for early CAD in several members   Social History   Occupational History  . Retired    Social History Main Topics  . Smoking status: Former Smoker    Quit date: 10/25/1952  . Smokeless tobacco: Never Used  . Alcohol use 3.0 - 3.6 oz/week    5 - 6 Glasses of wine per week     Comment: 5-6 times a week  . Drug use: No  . Sexual activity: Not on file   Tobacco Counseling Counseling given: No   Activities of  Daily Living In your present state of health, do you have any difficulty performing the following activities: 04/17/2016 03/21/2016  Hearing? N N  Vision? N N  Difficulty concentrating or making decisions? N N  Walking or climbing stairs? N N  Dressing or bathing? N N  Doing errands, shopping? N -  Preparing Food and eating ? N -  Using the Toilet? N -  In the past six months, have you accidently leaked urine? N -  Do you have problems with loss of bowel control? N -  Managing your Medications? N -  Managing your Finances? N -  Housekeeping or managing your Housekeeping? N -  Some recent data might be hidden    Immunizations and Health Maintenance Immunization  History  Administered Date(s) Administered  . Influenza-Unspecified 01/15/2014, 01/16/2016  . Pneumococcal Conjugate-13 01/13/2014  . Pneumococcal Polysaccharide-23 01/08/2015   Health Maintenance Due  Topic Date Due  . TETANUS/TDAP  02/15/1955  . ZOSTAVAX  02/15/1996    Patient Care Team: Binnie Rail, MD as PCP - General (Internal Medicine) Burnell Blanks, MD as Consulting Physician (Cardiology) Deboraha Sprang, MD as Consulting Physician (Cardiology) Irine Seal, MD as Attending Physician (Urology)  Indicate any recent Medical Services you may have received from other than [facility] providers in the past year (date may be approximate).    Assessment:   This is a routine wellness examination for Imari. Physical assessment deferred to PCP.   Hearing/Vision screen Hearing Screening Comments: Able to hear conversational tones w/o difficulty. No issues reported.   Vision Screening Comments: Wears glasses. Cataract (bilateral) surgery, Dr. Elonda Husky.   Dietary issues and exercise activities discussed: Current Exercise Habits: Structured exercise class, Type of exercise: strength training/weights;walking;treadmill;Other - see comments (golf; bike, bowflex, treadmill, weights at gym), Frequency (Times/Week): 3, Exercise  limited by: cardiac condition(s)   Diet (meal preparation, eat out, water intake, caffeinated beverages, dairy products, fruits and vegetables): Drinks water, coffee, milk and tea.   Breakfast: cereal, fruit Lunch: sandwich, soup Dinner: meat and vegetables   Wife prepares meals which are typically heart healthy and low carb.  Discussed maintaining current activity level.  Goals    . Exercise 150 minutes per week (moderate activity)          Maintain current active lifestyle to improve golf game.       Depression Screen PHQ 2/9 Scores 04/17/2016 11/21/2015 11/17/2015 03/25/2015  PHQ - 2 Score 0 0 0 0    Fall Risk Fall Risk  04/17/2016 12/09/2015 11/21/2015 11/17/2015  Falls in the past year? Yes No No No  Number falls in past yr: 1 - - -  Injury with Fall? No - - -  Follow up Falls prevention discussed - - -    Cognitive Function: MMSE - Mini Mental State Exam 04/17/2016  Orientation to time 4  Orientation to Place 5  Registration 3  Attention/ Calculation 5  Recall 2  Language- name 2 objects 2  Language- repeat 1  Language- follow 3 step command 3  Language- read & follow direction 1  Write a sentence 1  Copy design 1  Total score 28        Screening Tests Health Maintenance  Topic Date Due  . TETANUS/TDAP  02/15/1955  . ZOSTAVAX  02/15/1996  . INFLUENZA VACCINE  Addressed  . PNA vac Low Risk Adult  Completed        Plan:     Continue to eat heart healthy diet (full of fruits, vegetables, whole grains, lean protein, water--limit salt, fat, and sugar intake) and increase physical activity as tolerated.  Continue doing brain stimulating activities (puzzles, reading, adult coloring books, staying active) to keep memory sharp.   Bring a copy of your advance directives to your next office visit.   During the course of the visit Shermar was educated and counseled about the following appropriate screening and preventive services:   Vaccines to include Pneumoccal,  Influenza, Hepatitis B, Td, Zostavax, HCV  Colorectal cancer screening  Cardiovascular disease screening  Diabetes screening  Glaucoma screening  Nutrition counseling  Prostate cancer screening  Patient Instructions (the written plan) were given to the patient.   Gerilyn Nestle, RN   04/17/2016  Medical screening examination/treatment/procedure(s) were performed by non-physician practitioner and as supervising physician I was immediately available for consultation/collaboration. I agree with above. Binnie Rail, MD

## 2016-04-16 NOTE — Progress Notes (Signed)
Pre visit review using our clinic review tool, if applicable. No additional management support is needed unless otherwise documented below in the visit note. 

## 2016-04-17 ENCOUNTER — Ambulatory Visit (INDEPENDENT_AMBULATORY_CARE_PROVIDER_SITE_OTHER): Payer: Medicare Other

## 2016-04-17 VITALS — BP 124/60 | HR 65 | Ht 66.0 in | Wt 158.0 lb

## 2016-04-17 DIAGNOSIS — Z0000 Encounter for general adult medical examination without abnormal findings: Secondary | ICD-10-CM | POA: Diagnosis not present

## 2016-04-17 NOTE — Patient Instructions (Addendum)
Continue to eat heart healthy diet (full of fruits, vegetables, whole grains, lean protein, water--limit salt, fat, and sugar intake) and increase physical activity as tolerated.  Continue doing brain stimulating activities (puzzles, reading, adult coloring books, staying active) to keep memory sharp.   Bring a copy of your advance directives to your next office visit.   Fall Prevention in the Home Introduction Falls can cause injuries. They can happen to people of all ages. There are many things you can do to make your home safe and to help prevent falls. What can I do on the outside of my home?  Regularly fix the edges of walkways and driveways and fix any cracks.  Remove anything that might make you trip as you walk through a door, such as a raised step or threshold.  Trim any bushes or trees on the path to your home.  Use bright outdoor lighting.  Clear any walking paths of anything that might make someone trip, such as rocks or tools.  Regularly check to see if handrails are loose or broken. Make sure that both sides of any steps have handrails.  Any raised decks and porches should have guardrails on the edges.  Have any leaves, snow, or ice cleared regularly.  Use sand or salt on walking paths during winter.  Clean up any spills in your garage right away. This includes oil or grease spills. What can I do in the bathroom?  Use night lights.  Install grab bars by the toilet and in the tub and shower. Do not use towel bars as grab bars.  Use non-skid mats or decals in the tub or shower.  If you need to sit down in the shower, use a plastic, non-slip stool.  Keep the floor dry. Clean up any water that spills on the floor as soon as it happens.  Remove soap buildup in the tub or shower regularly.  Attach bath mats securely with double-sided non-slip rug tape.  Do not have throw rugs and other things on the floor that can make you trip. What can I do in the  bedroom?  Use night lights.  Make sure that you have a light by your bed that is easy to reach.  Do not use any sheets or blankets that are too big for your bed. They should not hang down onto the floor.  Have a firm chair that has side arms. You can use this for support while you get dressed.  Do not have throw rugs and other things on the floor that can make you trip. What can I do in the kitchen?  Clean up any spills right away.  Avoid walking on wet floors.  Keep items that you use a lot in easy-to-reach places.  If you need to reach something above you, use a strong step stool that has a grab bar.  Keep electrical cords out of the way.  Do not use floor polish or wax that makes floors slippery. If you must use wax, use non-skid floor wax.  Do not have throw rugs and other things on the floor that can make you trip. What can I do with my stairs?  Do not leave any items on the stairs.  Make sure that there are handrails on both sides of the stairs and use them. Fix handrails that are broken or loose. Make sure that handrails are as long as the stairways.  Check any carpeting to make sure that it is firmly attached to the   stairs. Fix any carpet that is loose or worn.  Avoid having throw rugs at the top or bottom of the stairs. If you do have throw rugs, attach them to the floor with carpet tape.  Make sure that you have a light switch at the top of the stairs and the bottom of the stairs. If you do not have them, ask someone to add them for you. What else can I do to help prevent falls?  Wear shoes that:  Do not have high heels.  Have rubber bottoms.  Are comfortable and fit you well.  Are closed at the toe. Do not wear sandals.  If you use a stepladder:  Make sure that it is fully opened. Do not climb a closed stepladder.  Make sure that both sides of the stepladder are locked into place.  Ask someone to hold it for you, if possible.  Clearly mark and make  sure that you can see:  Any grab bars or handrails.  First and last steps.  Where the edge of each step is.  Use tools that help you move around (mobility aids) if they are needed. These include:  Canes.  Walkers.  Scooters.  Crutches.  Turn on the lights when you go into a dark area. Replace any light bulbs as soon as they burn out.  Set up your furniture so you have a clear path. Avoid moving your furniture around.  If any of your floors are uneven, fix them.  If there are any pets around you, be aware of where they are.  Review your medicines with your doctor. Some medicines can make you feel dizzy. This can increase your chance of falling. Ask your doctor what other things that you can do to help prevent falls. This information is not intended to replace advice given to you by your health care provider. Make sure you discuss any questions you have with your health care provider. Document Released: 01/20/2009 Document Revised: 09/01/2015 Document Reviewed: 04/30/2014  2017 Elsevier  Health Maintenance, Male A healthy lifestyle and preventative care can promote health and wellness.  Maintain regular health, dental, and eye exams.  Eat a healthy diet. Foods like vegetables, fruits, whole grains, low-fat dairy products, and lean protein foods contain the nutrients you need and are low in calories. Decrease your intake of foods high in solid fats, added sugars, and salt. Get information about a proper diet from your health care provider, if necessary.  Regular physical exercise is one of the most important things you can do for your health. Most adults should get at least 150 minutes of moderate-intensity exercise (any activity that increases your heart rate and causes you to sweat) each week. In addition, most adults need muscle-strengthening exercises on 2 or more days a week.   Maintain a healthy weight. The body mass index (BMI) is a screening tool to identify possible  weight problems. It provides an estimate of body fat based on height and weight. Your health care provider can find your BMI and can help you achieve or maintain a healthy weight. For males 20 years and older:  A BMI below 18.5 is considered underweight.  A BMI of 18.5 to 24.9 is normal.  A BMI of 25 to 29.9 is considered overweight.  A BMI of 30 and above is considered obese.  Maintain normal blood lipids and cholesterol by exercising and minimizing your intake of saturated fat. Eat a balanced diet with plenty of fruits and vegetables. Blood tests   for lipids and cholesterol should begin at age 20 and be repeated every 5 years. If your lipid or cholesterol levels are high, you are over age 50, or you are at high risk for heart disease, you may need your cholesterol levels checked more frequently.Ongoing high lipid and cholesterol levels should be treated with medicines if diet and exercise are not working.  If you smoke, find out from your health care provider how to quit. If you do not use tobacco, do not start.  Lung cancer screening is recommended for adults aged 55-80 years who are at high risk for developing lung cancer because of a history of smoking. A yearly low-dose CT scan of the lungs is recommended for people who have at least a 30-pack-year history of smoking and are current smokers or have quit within the past 15 years. A pack year of smoking is smoking an average of 1 pack of cigarettes a day for 1 year (for example, a 30-pack-year history of smoking could mean smoking 1 pack a day for 30 years or 2 packs a day for 15 years). Yearly screening should continue until the smoker has stopped smoking for at least 15 years. Yearly screening should be stopped for people who develop a health problem that would prevent them from having lung cancer treatment.  If you choose to drink alcohol, do not have more than 2 drinks per day. One drink is considered to be 12 oz (360 mL) of beer, 5 oz (150  mL) of wine, or 1.5 oz (45 mL) of liquor.  Avoid the use of street drugs. Do not share needles with anyone. Ask for help if you need support or instructions about stopping the use of drugs.  High blood pressure causes heart disease and increases the risk of stroke. High blood pressure is more likely to develop in:  People who have blood pressure in the end of the normal range (100-139/85-89 mm Hg).  People who are overweight or obese.  People who are African American.  If you are 18-39 years of age, have your blood pressure checked every 3-5 years. If you are 40 years of age or older, have your blood pressure checked every year. You should have your blood pressure measured twice-once when you are at a hospital or clinic, and once when you are not at a hospital or clinic. Record the average of the two measurements. To check your blood pressure when you are not at a hospital or clinic, you can use:  An automated blood pressure machine at a pharmacy.  A home blood pressure monitor.  If you are 45-79 years old, ask your health care provider if you should take aspirin to prevent heart disease.  Diabetes screening involves taking a blood sample to check your fasting blood sugar level. This should be done once every 3 years after age 45 if you are at a normal weight and without risk factors for diabetes. Testing should be considered at a younger age or be carried out more frequently if you are overweight and have at least 1 risk factor for diabetes.  Colorectal cancer can be detected and often prevented. Most routine colorectal cancer screening begins at the age of 50 and continues through age 75. However, your health care provider may recommend screening at an earlier age if you have risk factors for colon cancer. On a yearly basis, your health care provider may provide home test kits to check for hidden blood in the stool. A small camera   at the end of a tube may be used to directly examine the colon  (sigmoidoscopy or colonoscopy) to detect the earliest forms of colorectal cancer. Talk to your health care provider about this at age 50 when routine screening begins. A direct exam of the colon should be repeated every 5-10 years through age 75, unless early forms of precancerous polyps or small growths are found.  People who are at an increased risk for hepatitis B should be screened for this virus. You are considered at high risk for hepatitis B if:  You were born in a country where hepatitis B occurs often. Talk with your health care provider about which countries are considered high risk.  Your parents were born in a high-risk country and you have not received a shot to protect against hepatitis B (hepatitis B vaccine).  You have HIV or AIDS.  You use needles to inject street drugs.  You live with, or have sex with, someone who has hepatitis B.  You are a man who has sex with other men (MSM).  You get hemodialysis treatment.  You take certain medicines for conditions like cancer, organ transplantation, and autoimmune conditions.  Hepatitis C blood testing is recommended for all people born from 1945 through 1965 and any individual with known risk factors for hepatitis C.  Healthy men should no longer receive prostate-specific antigen (PSA) blood tests as part of routine cancer screening. Talk to your health care provider about prostate cancer screening.  Testicular cancer screening is not recommended for adolescents or adult males who have no symptoms. Screening includes self-exam, a health care provider exam, and other screening tests. Consult with your health care provider about any symptoms you have or any concerns you have about testicular cancer.  Practice safe sex. Use condoms and avoid high-risk sexual practices to reduce the spread of sexually transmitted infections (STIs).  You should be screened for STIs, including gonorrhea and chlamydia if:  You are sexually active and  are younger than 24 years.  You are older than 24 years, and your health care provider tells you that you are at risk for this type of infection.  Your sexual activity has changed since you were last screened, and you are at an increased risk for chlamydia or gonorrhea. Ask your health care provider if you are at risk.  If you are at risk of being infected with HIV, it is recommended that you take a prescription medicine daily to prevent HIV infection. This is called pre-exposure prophylaxis (PrEP). You are considered at risk if:  You are a man who has sex with other men (MSM).  You are a heterosexual man who is sexually active with multiple partners.  You take drugs by injection.  You are sexually active with a partner who has HIV.  Talk with your health care provider about whether you are at high risk of being infected with HIV. If you choose to begin PrEP, you should first be tested for HIV. You should then be tested every 3 months for as long as you are taking PrEP.  Use sunscreen. Apply sunscreen liberally and repeatedly throughout the day. You should seek shade when your shadow is shorter than you. Protect yourself by wearing long sleeves, pants, a wide-brimmed hat, and sunglasses year round whenever you are outdoors.  Tell your health care provider of new moles or changes in moles, especially if there is a change in shape or color. Also, tell your health care provider if a mole   is larger than the size of a pencil eraser.  A one-time screening for abdominal aortic aneurysm (AAA) and surgical repair of large AAAs by ultrasound is recommended for men aged 65-75 years who are current or former smokers.  Stay current with your vaccines (immunizations). This information is not intended to replace advice given to you by your health care provider. Make sure you discuss any questions you have with your health care provider. Document Released: 09/22/2007 Document Revised: 04/16/2014 Document  Reviewed: 12/28/2014 Elsevier Interactive Patient Education  2017 Elsevier Inc.  

## 2016-04-24 ENCOUNTER — Other Ambulatory Visit: Payer: Self-pay | Admitting: *Deleted

## 2016-04-24 MED ORDER — METOPROLOL TARTRATE 100 MG PO TABS: 50.0000 mg | ORAL_TABLET | Freq: Every morning | ORAL | 1 refills | Status: DC

## 2016-05-01 DIAGNOSIS — Z1212 Encounter for screening for malignant neoplasm of rectum: Secondary | ICD-10-CM | POA: Diagnosis not present

## 2016-05-01 DIAGNOSIS — Z1211 Encounter for screening for malignant neoplasm of colon: Secondary | ICD-10-CM | POA: Diagnosis not present

## 2016-05-02 LAB — COLOGUARD: Cologuard: NEGATIVE

## 2016-05-12 ENCOUNTER — Other Ambulatory Visit: Payer: Self-pay | Admitting: Internal Medicine

## 2016-05-12 DIAGNOSIS — F419 Anxiety disorder, unspecified: Secondary | ICD-10-CM

## 2016-05-12 NOTE — Telephone Encounter (Signed)
RX faxed to [facility] 

## 2016-06-11 ENCOUNTER — Encounter: Payer: Self-pay | Admitting: Cardiovascular Disease

## 2016-06-11 ENCOUNTER — Ambulatory Visit (INDEPENDENT_AMBULATORY_CARE_PROVIDER_SITE_OTHER): Payer: Medicare Other | Admitting: Cardiovascular Disease

## 2016-06-11 VITALS — BP 120/62 | HR 78 | Ht 66.0 in | Wt 158.4 lb

## 2016-06-11 DIAGNOSIS — I481 Persistent atrial fibrillation: Secondary | ICD-10-CM

## 2016-06-11 DIAGNOSIS — Z952 Presence of prosthetic heart valve: Secondary | ICD-10-CM | POA: Diagnosis not present

## 2016-06-11 DIAGNOSIS — I350 Nonrheumatic aortic (valve) stenosis: Secondary | ICD-10-CM

## 2016-06-11 DIAGNOSIS — F419 Anxiety disorder, unspecified: Secondary | ICD-10-CM | POA: Diagnosis not present

## 2016-06-11 DIAGNOSIS — I4819 Other persistent atrial fibrillation: Secondary | ICD-10-CM

## 2016-06-11 DIAGNOSIS — I2510 Atherosclerotic heart disease of native coronary artery without angina pectoris: Secondary | ICD-10-CM

## 2016-06-11 MED ORDER — DIAZEPAM 5 MG PO TABS: 5.0000 mg | ORAL_TABLET | Freq: Every day | ORAL | 2 refills | Status: DC | PRN

## 2016-06-11 NOTE — Patient Instructions (Signed)

## 2016-06-11 NOTE — Progress Notes (Signed)
Chief Complaint  Patient presents with  . Chronic systolic heart failure    follow up  . Aortic valve stenosis    severe     History of Present Illness: 83 yo male with history of CAD s/p CABG (1982 with redo 1994), ischemic cardiomyopathy, out-of-hospital cardiac arrest in 2011 with successful resuscitation and subsequent placement of [employer] ICD, and aortic stenosis s/p TAVR who is here today for cardiac follow up. I performed his TAVR procedure on 03/23/14. His cardiac issues had been followed by Dr. Mare Ferrari.  Echo January 2017 with preserved LV systolic function with normally functioning bioprosthetic aortic valve. There is trivial AI. Cardiac cath October 2015 with total occlusion of left main and RCA. Patent LIMA to LAD, patent SVG to OM, patent SVG to RCA. Of note, diffuse disease in LAD both before and after LIMA graft insertion.   He tells me today that he is feeling well. He has no chest pain and no dyspnea. He denies any PND, orthopnea, or lower extremity edema. He denies dizziness, near syncope or syncope. He is going to the gym and rides the bike, uses the treadmill. He is also playing golf.   Primary Care Physician: Binnie Rail, MD   Past Medical History:  Diagnosis Date  . Aortic stenosis, severe    s/p TAVR with Edwards Sapien 3 THV (size 26 mm, model # 9600TFX, serial # [id number])  . Arthritis   . Cardiac arrest ([place])   . Claudication ([place])   . Coronary artery disease   . Essential hypertension   . Headache   . History of hiatal hernia   . Hyperlipidemia   . Kidney stones   . Peripheral vascular disease [facility])     Past Surgical History:  Procedure Laterality Date  . ADENOIDECTOMY    . APPENDECTOMY    . CARDIAC CATHETERIZATION   11/16/2009   . CARDIAC DEFIBRILLATOR PLACEMENT    . cataract surgery      Bilateral cataract surgery  . CORONARY ARTERY BYPASS GRAFT     in 1982 and '84 with subsequent PTCI  . EP IMPLANTABLE DEVICE N/A 03/21/2016   Procedure:  ICD Generator Changeout;  Surgeon: Deboraha Sprang, MD;  Location: [facility];  Service: Cardiovascular;  Laterality: N/A;  . HERNIA REPAIR    . LEFT AND RIGHT HEART CATHETERIZATION WITH CORONARY/GRAFT ANGIOGRAM N/A 02/01/2014   Procedure: LEFT AND RIGHT HEART CATHETERIZATION WITH Beatrix Fetters;  Surgeon: Sinclair Grooms, MD;  Location: [facility] CATH LAB;  Service: Cardiovascular;  Laterality: N/A;  . left elbow tendon surgery    . TEE WITHOUT CARDIOVERSION N/A 03/23/2014   Procedure: TRANSESOPHAGEAL ECHOCARDIOGRAM (TEE);  Surgeon: Burnell Blanks, MD;  Location: [facility];  Service: Open Heart Surgery;  Laterality: N/A;  . TONSILLECTOMY    . TRANSCATHETER AORTIC VALVE REPLACEMENT, TRANSFEMORAL N/A 03/23/2014   Procedure: TRANSCATHETER AORTIC VALVE REPLACEMENT, TRANSFEMORAL;  Surgeon: Burnell Blanks, MD;  Location: [facility];  Service: Open Heart Surgery;  Laterality: N/A;    Current Outpatient Prescriptions  Medication Sig Dispense Refill  . acetaminophen (TYLENOL) 325 MG tablet Take 650 mg by mouth daily as needed for moderate pain or headache.    Marland Kitchen amLODipine (NORVASC) 5 MG tablet Take 1 tablet (5 mg total) by mouth daily. 90 tablet 3  . calcium carbonate (TUMS - DOSED IN MG ELEMENTAL CALCIUM) 500 MG chewable tablet Chew 2 tablets by mouth daily as needed for indigestion or heartburn.    Marland Kitchen  Coenzyme Q10 (COQ10 PO) Take 1 Dose by mouth at bedtime. Take 150mg  by mouth every night (liquid)    . diazepam (VALIUM) 5 MG tablet Take 1 tablet (5 mg total) by mouth daily as needed. 30 tablet 2  . furosemide (LASIX) 40 MG tablet Take 1 tablet (40 mg total) by mouth daily. 90 tablet 3  . KRILL OIL PO Take 1 tablet by mouth daily.    Marland Kitchen latanoprost (XALATAN) 0.005 % ophthalmic solution Place 1 drop into both eyes at bedtime.     Marland Kitchen lisinopril (PRINIVIL,ZESTRIL) 20 MG tablet Take 1 tablet (20 mg total) by mouth daily. 90 tablet 1  . Magnesium Oxide (MAG-OXIDE PO) Take 1 tablet by mouth  at bedtime.     . metoprolol (LOPRESSOR) 100 MG tablet Take 0.5 tablets (50 mg total) by mouth every morning. 45 tablet 1  . metoprolol (LOPRESSOR) 50 MG tablet TAKE 1/2 TABLET BY MOUTH EVERY EVENING. 45 tablet 3  . Multiple Vitamin (MULTIVITAMIN) tablet Take 1 tablet by mouth at bedtime.     . naphazoline-pheniramine (NAPHCON-A) 0.025-0.3 % ophthalmic solution Place 1 drop into both eyes daily as needed for irritation.    . nitroGLYCERIN (NITROSTAT) 0.4 MG SL tablet Place 1 tablet (0.4 mg total) under the tongue every 5 (five) minutes as needed for chest pain (for chest pain (MAX of 3 doses)). 25 tablet 6  . potassium chloride (K-DUR,KLOR-CON) 10 MEQ tablet Take 10 mEq by mouth 2 (two) times daily.      Marland Kitchen PRADAXA 150 MG CAPS capsule TAKE 1 CAPSULE BY MOUTH TWICE DAILY 60 capsule 7  . rosuvastatin (CRESTOR) 20 MG tablet Take 20 mg by mouth at bedtime.    . sildenafil (REVATIO) 20 MG tablet Take 2-3 tablets by mouth daily as needed 30 tablet 1  . Thiamine HCl (VITAMIN B-1) 100 MG tablet Take 100 mg by mouth daily.       No current facility-administered medications for this visit.     Allergies  Allergen Reactions  . Cyclobenzaprine Hcl Other (See Comments)    Unspecified reaction pt doesn't know what reaction  . Tetracycline     Rash     Social History   Social History  . Marital status: Married    Spouse name: N/A  . Number of children: 3  . Years of education: N/A   Occupational History  . Retired    Social History Main Topics  . Smoking status: Former Smoker    Quit date: 10/25/1952  . Smokeless tobacco: Never Used  . Alcohol use 3.0 - 3.6 oz/week    5 - 6 Glasses of wine per week     Comment: 5-6 times a week  . Drug use: No  . Sexual activity: Not on file   Other Topics Concern  . Not on file   Social History Narrative  . No narrative on file    Family History  Problem Relation Age of Onset  . Heart attack Father 28  . Coronary artery disease Other      family history is dignificant for early CAD in several members    Review of Systems:  As stated in the HPI and otherwise negative.   BP 120/62   Pulse 78   Ht 5\' 6"  (1.676 m)   Wt 158 lb 6.4 oz (71.8 kg)   BMI 25.57 kg/m   Physical Examination: General: Well developed, well nourished, NAD  HEENT: OP clear, mucus membranes moist  SKIN: warm, dry.  No rashes. Neuro: No focal deficits  Musculoskeletal: Muscle strength 5/5 all ext  Psychiatric: Mood and affect normal  Neck: No JVD, no carotid bruits, no thyromegaly, no lymphadenopathy.  Lungs:Clear bilaterally, no wheezes, rhonci, crackles Cardiovascular: Regular rate and rhythm. Soft diastolic murmur. No gallops or rubs. Abdomen:Soft. Bowel sounds present. Non-tender.  Extremities: No lower extremity edema. Pulses are 2 + in the bilateral DP/PT.  Echo January 2017: Left ventricle: Posterior basal hypokinesis The cavity size was   normal. Wall thickness was normal. Systolic function was normal.   The estimated ejection fraction was in the range of 50% to 55%. - Aortic valve: Post TAVR with 26 mm Sapien 3 valve No perivalvular   regurgitation. - Left atrium: The atrium was moderately dilated. - Atrial septum: No defect or patent foramen ovale was identified.  EKG:  EKG is not  ordered today. The ekg ordered today demonstrates   Recent Labs: 12/05/2015: ALT 12 03/14/2016: BUN 21; Creat 0.93; Hemoglobin 15.0; Platelets 152; Potassium 4.4; Sodium 140   Lipid Panel    Component Value Date/Time   CHOL 129 12/05/2015 1002   TRIG 92 12/05/2015 1002   HDL 41 12/05/2015 1002   CHOLHDL 3.1 12/05/2015 1002   VLDL 18 12/05/2015 1002   LDLCALC 70 12/05/2015 1002     Wt Readings from Last 3 Encounters:  06/11/16 158 lb 6.4 oz (71.8 kg)  04/17/16 158 lb (71.7 kg)  03/21/16 154 lb (69.9 kg)     Other studies Reviewed: Additional studies/ records that were reviewed today include: . Review of the above records demonstrates:     Assessment and Plan:   1. Severe Aortic valve stenosis: He had severe symptomatic aortic valve stenosis. Now s/p TAVR on 03/23/14. He is on Pradaxa for atrial fibrillation. He will use antibiotics before dental visits.   2. CAD s/p CABG: He is s/p CABG in 1982 with redo CABG in 1994. Coronary anatomy stable by cath October 2015. He is on Imdur, statin and beta blocker. No change in chronic stable angina. He is not on an ASA since he is also on Pradaxa.   3. Atrial fibrillation, persistent: He is on metoprolol and Pradaxa.   4. History of cardiac arrest: ICD in place. Generator change December 2017.   5. HLD: He is on a statin. LDL is at goal.   6. Erectile dysfunction: Viagra prn.   7. Anxiety: Refill Valium.    Current medicines are reviewed at length with the patient today.  The patient does not have concerns regarding medicines.  The following changes have been made:  no change  Labs/ tests ordered today include:  No orders of the defined types were placed in this encounter.    Disposition:   FU with me in 6 months.      Signed, Lauree Chandler, MD 06/11/2016 11:26 AM    [facility] [address], [place], [place]  [address] Phone: [phone]; Fax: [phone]

## 2016-06-13 ENCOUNTER — Telehealth: Payer: Self-pay | Admitting: Internal Medicine

## 2016-06-13 NOTE — Telephone Encounter (Signed)
Spoke with Cologaurd Rep, states results are negative. Will be faxing over results.

## 2016-06-13 NOTE — Telephone Encounter (Signed)
Pt called in and would like someone to call him about his cologaurd test

## 2016-06-14 ENCOUNTER — Encounter: Payer: Self-pay | Admitting: Internal Medicine

## 2016-06-14 NOTE — Telephone Encounter (Signed)
Spoke with pt to inform.  

## 2016-06-19 NOTE — Progress Notes (Signed)
[name] Patient Care Team: Binnie Rail, MD as PCP - General (Internal Medicine) Burnell Blanks, MD as Consulting Physician (Cardiology) Deboraha Sprang, MD as Consulting Physician (Cardiology) Irine Seal, MD as Attending Physician (Urology) Marcene Brawn (Optometry)   HPI  [name] is a 83 y.o. male Seen in followup for ICD implanted for aborted cardiac arrest in the setting of ischemic heart disease prior bypass grafting and redo bypass grafting most recently in 1994 with subsequent PCI.  He underwent ICD generator replacement 12/17.  He also has a history of Atrial fibrillation with Thrombo- embolic risk factors  notable for age vascular disease and hypertension and LV dysfunction.  His CHADS VASC score is 5.  Now on dabigitran  Now with persistent atrial tach    LHC 2015>>  total occlusion of left main and RCA. Patent LIMA to LAD, patent SVG to OM, patent SVG to RCA. Of note, diffuse disease in LAD both before and after LIMA graft insertio Echo in  October 2014 demonstrated normal left ventricular function. Aortic valve demonstrated moderate-severe stenosis with a mean gradient of 35 and peak 60   He underwent TAVR.  In the wake of this, anticoagulation was discontinued and he was placed on aspirin/Plavix Echo 2017 Normal LV function/ Aortic bioprosthesis .  February 2015 he had recurrent polymorphic ventricular tachycardia associated with a long short initiation sequence  that resulted in appropriate ICD discharge.        Past Medical History:  Diagnosis Date  . Aortic stenosis, severe    s/p TAVR with Edwards Sapien 3 THV (size 26 mm, model # 9600TFX, serial # [id number])  . Arthritis   . Cardiac arrest ([place])   . Claudication ([place])   . Coronary artery disease   . Essential hypertension   . Headache   . History of hiatal hernia   . Hyperlipidemia   . Kidney stones   . Peripheral vascular disease [facility])     Past Surgical History:  Procedure Laterality Date   . ADENOIDECTOMY    . APPENDECTOMY    . CARDIAC CATHETERIZATION   11/16/2009   . CARDIAC DEFIBRILLATOR PLACEMENT    . cataract surgery      Bilateral cataract surgery  . CORONARY ARTERY BYPASS GRAFT     in 1982 and '84 with subsequent PTCI  . EP IMPLANTABLE DEVICE N/A 03/21/2016   Procedure: ICD Generator Changeout;  Surgeon: Deboraha Sprang, MD;  Location: [place] CV LAB;  Service: Cardiovascular;  Laterality: N/A;  . HERNIA REPAIR    . LEFT AND RIGHT HEART CATHETERIZATION WITH CORONARY/GRAFT ANGIOGRAM N/A 02/01/2014   Procedure: LEFT AND RIGHT HEART CATHETERIZATION WITH Beatrix Fetters;  Surgeon: Sinclair Grooms, MD;  Location: [facility] CATH LAB;  Service: Cardiovascular;  Laterality: N/A;  . left elbow tendon surgery    . TEE WITHOUT CARDIOVERSION N/A 03/23/2014   Procedure: TRANSESOPHAGEAL ECHOCARDIOGRAM (TEE);  Surgeon: Burnell Blanks, MD;  Location: [place];  Service: Open Heart Surgery;  Laterality: N/A;  . TONSILLECTOMY    . TRANSCATHETER AORTIC VALVE REPLACEMENT, TRANSFEMORAL N/A 03/23/2014   Procedure: TRANSCATHETER AORTIC VALVE REPLACEMENT, TRANSFEMORAL;  Surgeon: Burnell Blanks, MD;  Location: [place];  Service: Open Heart Surgery;  Laterality: N/A;    Current Outpatient Prescriptions  Medication Sig Dispense Refill  . acetaminophen (TYLENOL) 325 MG tablet Take 650 mg by mouth daily as needed for moderate pain or headache.    Marland Kitchen amLODipine (NORVASC) 5 MG tablet Take 1  tablet (5 mg total) by mouth daily. 90 tablet 3  . calcium carbonate (TUMS - DOSED IN MG ELEMENTAL CALCIUM) 500 MG chewable tablet Chew 2 tablets by mouth daily as needed for indigestion or heartburn.    . Coenzyme Q10 (COQ10 PO) Take 1 Dose by mouth at bedtime. Take 150mg  by mouth every night (liquid)    . diazepam (VALIUM) 5 MG tablet Take 1 tablet (5 mg total) by mouth daily as needed. 30 tablet 2  . furosemide (LASIX) 40 MG tablet Take 1 tablet (40 mg total) by mouth daily. 90 tablet 3  .  KRILL OIL PO Take 1 tablet by mouth daily.    Marland Kitchen latanoprost (XALATAN) 0.005 % ophthalmic solution Place 1 drop into both eyes at bedtime.     Marland Kitchen lisinopril (PRINIVIL,ZESTRIL) 20 MG tablet Take 1 tablet (20 mg total) by mouth daily. 90 tablet 1  . Magnesium Oxide (MAG-OXIDE PO) Take 1 tablet by mouth at bedtime.     . metoprolol (LOPRESSOR) 100 MG tablet Take 0.5 tablets (50 mg total) by mouth every morning. 45 tablet 1  . metoprolol (LOPRESSOR) 50 MG tablet TAKE 1/2 TABLET BY MOUTH EVERY EVENING. 45 tablet 3  . Multiple Vitamin (MULTIVITAMIN) tablet Take 1 tablet by mouth at bedtime.     . naphazoline-pheniramine (NAPHCON-A) 0.025-0.3 % ophthalmic solution Place 1 drop into both eyes daily as needed for irritation.    . nitroGLYCERIN (NITROSTAT) 0.4 MG SL tablet Place 1 tablet (0.4 mg total) under the tongue every 5 (five) minutes as needed for chest pain (for chest pain (MAX of 3 doses)). 25 tablet 6  . potassium chloride (K-DUR,KLOR-CON) 10 MEQ tablet Take 10 mEq by mouth 2 (two) times daily.      Marland Kitchen PRADAXA 150 MG CAPS capsule TAKE 1 CAPSULE BY MOUTH TWICE DAILY 60 capsule 7  . rosuvastatin (CRESTOR) 20 MG tablet Take 20 mg by mouth at bedtime.    . sildenafil (REVATIO) 20 MG tablet Take 2-3 tablets by mouth daily as needed 30 tablet 1  . Thiamine HCl (VITAMIN B-1) 100 MG tablet Take 100 mg by mouth daily.       No current facility-administered medications for this visit.     Allergies  Allergen Reactions  . Cyclobenzaprine Hcl Other (See Comments)    Unspecified reaction pt doesn't know what reaction  . Tetracycline     Rash     Review of Systems negative except from HPI and PMH  Physical Exam BP 122/64   Pulse 84   Ht 5\' 6"  (1.676 m)   Wt 160 lb (72.6 kg)   SpO2 98%   BMI 25.82 kg/m  Well developed and well nourished in no acute distress HENT normal E scleral and icterus clear Neck Supple JVP flat; carotids a little delayed Clear to ausculation Regular rate and rhythm,   2/6 systolic m Soft with active bowel sounds No clubbing cyanosis none Edema Alert and oriented, grossly normal motor and sensory function Skin Warm and Dry  ECg Personally reviewed   demonstrates P-synchronous/ AV  pacing  Assessment and  Plan  Sudden cardiac death-aborted -    AUTOMATIC IMPLANTABLE CARDIAC DEFIBRILLATOR SITU   .   S/P CABG (coronary artery bypass graft) -     Ischemic Cardiomyopathy  Aortic stenosis-s/p AVR   Benign hypertensive heart disease without heart failure -  ATrial fibrillation//atrial tach    Euvolemic continue current meds  Without symptoms of ischemia  Persistent atrial tach but without  symptoms  Guideline directed medical therapy  No intercurrent Ventricular tachycardia  Atrial tach is frequent but he retains some degree of heart rate excursion  His atrial tach may be 2:1 blanked affording some degree of AV synchrony

## 2016-06-21 ENCOUNTER — Ambulatory Visit (INDEPENDENT_AMBULATORY_CARE_PROVIDER_SITE_OTHER): Payer: Medicare Other | Admitting: Internal Medicine

## 2016-06-21 ENCOUNTER — Encounter: Payer: Self-pay | Admitting: Internal Medicine

## 2016-06-21 VITALS — BP 122/64 | HR 84 | Ht 66.0 in | Wt 160.0 lb

## 2016-06-21 DIAGNOSIS — I469 Cardiac arrest, cause unspecified: Secondary | ICD-10-CM | POA: Diagnosis not present

## 2016-06-21 DIAGNOSIS — I5022 Chronic systolic (congestive) heart failure: Secondary | ICD-10-CM

## 2016-06-21 DIAGNOSIS — I2589 Other forms of chronic ischemic heart disease: Secondary | ICD-10-CM | POA: Diagnosis not present

## 2016-06-21 DIAGNOSIS — I255 Ischemic cardiomyopathy: Secondary | ICD-10-CM

## 2016-06-21 DIAGNOSIS — Z95810 Presence of automatic (implantable) cardiac defibrillator: Secondary | ICD-10-CM

## 2016-06-21 DIAGNOSIS — I480 Paroxysmal atrial fibrillation: Secondary | ICD-10-CM

## 2016-06-21 LAB — CUP PACEART INCLINIC DEVICE CHECK
Battery Voltage: 3.09 V
Brady Statistic AP VP Percent: 13.12 %
Brady Statistic AS VS Percent: 34.84 %
Brady Statistic RV Percent Paced: 61.82 %
HIGH POWER IMPEDANCE MEASURED VALUE: 52 Ohm
HighPow Impedance: 42 Ohm
Implantable Lead Implant Date: 20110601
Implantable Lead Location: 753859
Implantable Lead Location: 753860
Implantable Lead Model: 6947
Implantable Pulse Generator Implant Date: 20171213
Lead Channel Impedance Value: 342 Ohm
Lead Channel Impedance Value: 475 Ohm
Lead Channel Pacing Threshold Amplitude: 1.5 V
Lead Channel Sensing Intrinsic Amplitude: 1.5 mV
Lead Channel Sensing Intrinsic Amplitude: 2.5 mV
Lead Channel Sensing Intrinsic Amplitude: 4.125 mV
Lead Channel Setting Pacing Amplitude: 1.5 V
Lead Channel Setting Pacing Amplitude: 3 V
Lead Channel Setting Pacing Pulse Width: 0.4 ms
MDC IDC LEAD IMPLANT DT: 20110601
MDC IDC MSMT BATTERY REMAINING LONGEVITY: 102 mo
MDC IDC MSMT LEADCHNL RA PACING THRESHOLD AMPLITUDE: 0.625 V
MDC IDC MSMT LEADCHNL RA PACING THRESHOLD PULSEWIDTH: 0.4 ms
MDC IDC MSMT LEADCHNL RA SENSING INTR AMPL: 2.375 mV
MDC IDC MSMT LEADCHNL RV IMPEDANCE VALUE: 285 Ohm
MDC IDC MSMT LEADCHNL RV PACING THRESHOLD PULSEWIDTH: 0.4 ms
MDC IDC SESS DTM: 20180315133244
MDC IDC SET LEADCHNL RV SENSING SENSITIVITY: 0.3 mV
MDC IDC STAT BRADY AP VS PERCENT: 3.05 %
MDC IDC STAT BRADY AS VP PERCENT: 48.99 %
MDC IDC STAT BRADY RA PERCENT PACED: 14.87 %

## 2016-06-21 MED ORDER — RIVAROXABAN 20 MG PO TABS: 20.0000 mg | ORAL_TABLET | Freq: Every day | ORAL | 0 refills | Status: DC

## 2016-06-21 NOTE — Patient Instructions (Addendum)
Medication Instructions: - Your physician has recommended you make the following change in your medication: 1) Stop pradaxa 2) Start xarelto 20 mg- take one tablet by mouth once daily with food/ snack  Labwork: - none ordered  Procedures/Testing: - none ordered  Follow-Up: - Remote monitoring is used to monitor your Pacemaker of ICD from home. This monitoring reduces the number of office visits required to check your device to one time per year. It allows [place] to keep an eye on the functioning of your device to ensure it is working properly. You are scheduled for a device check from home on 09/20/16. You may send your transmission at any time that day. If you have a wireless device, the transmission will be sent automatically. After your physician reviews your transmission, you will receive a postcard with your next transmission date.  - Your physician wants you to follow-up in: 1 year with Tommye Standard, PA for Dr. Caryl Comes. You will receive a reminder letter in the mail two months in advance. If you don't receive a letter, please call our office to schedule the follow-up appointment.  Any Additional Special Instructions Will Be Listed Below (If Applicable).     If you need a refill on your cardiac medications before your next appointment, please call your pharmacy.

## 2016-06-26 ENCOUNTER — Other Ambulatory Visit: Payer: Self-pay | Admitting: Physician Assistant

## 2016-06-26 ENCOUNTER — Other Ambulatory Visit: Payer: Self-pay | Admitting: Cardiovascular Disease

## 2016-07-25 DIAGNOSIS — N3946 Mixed incontinence: Secondary | ICD-10-CM | POA: Diagnosis not present

## 2016-07-25 DIAGNOSIS — N401 Enlarged prostate with lower urinary tract symptoms: Secondary | ICD-10-CM | POA: Diagnosis not present

## 2016-08-07 ENCOUNTER — Telehealth: Payer: Self-pay | Admitting: Cardiology

## 2016-08-07 NOTE — Telephone Encounter (Signed)
Pt called and stated that his heart rate has not been 60 in the last 2 days and his ICD is set at 60. I informed pt that his device is set from 60-130 bpm and that it won't go any lower than 60 bpm and no faster than 130 bpm. Pt stated that he feels fine. No shortness of breath, palpitations, dizziness. Pt is going to send a transmission so we can make sure he isn't having any episodes please call back w/ results.

## 2016-08-07 NOTE — Telephone Encounter (Signed)
Spoke w/ pt and informed him that we still have not received his remote transmission. Pt stated that he didn't know how. I walked pt through sending a manual  transmission. Transmission received.

## 2016-08-07 NOTE — Telephone Encounter (Signed)
Remote transmission reviewed. Presenting rhythm: AT/Vp 74bpm. 68.1% AT/AF burden since 06/21/16 + xarelto. No ventricular arrhythmias. Stable thoracic impedance. Stable device function.  Informed patient that the transmission was stable. Patient states that he's been feeling fine. No sx's. I explained to him that a HR of 74bpm is not abnormal. Patient verbalized understanding.

## 2016-08-14 ENCOUNTER — Other Ambulatory Visit: Payer: Self-pay | Admitting: Internal Medicine

## 2016-08-22 DIAGNOSIS — N3946 Mixed incontinence: Secondary | ICD-10-CM | POA: Diagnosis not present

## 2016-09-04 ENCOUNTER — Other Ambulatory Visit: Payer: Self-pay | Admitting: Internal Medicine

## 2016-09-04 NOTE — Telephone Encounter (Signed)
Pt saw Dr Caryl Comes on 06/21/16, Wt 72.6 Kg. SCr on 03/14/16 was 0.93. Creatine clearance 65.05 mL/min. Xarelto 20mg  reordered.

## 2016-09-17 ENCOUNTER — Telehealth: Payer: Self-pay | Admitting: Cardiovascular Disease

## 2016-09-17 DIAGNOSIS — E7800 Pure hypercholesterolemia, unspecified: Secondary | ICD-10-CM

## 2016-09-17 NOTE — Telephone Encounter (Signed)
Per patient request, scheduled patient for fasting labs 9/14. Patient was grateful for call.

## 2016-09-17 NOTE — Telephone Encounter (Signed)
New Message      Patient wants to get his blood work done in September, the orders in the system expire before then, please adjust and let patient know when to come in , thank you

## 2016-09-20 ENCOUNTER — Ambulatory Visit (INDEPENDENT_AMBULATORY_CARE_PROVIDER_SITE_OTHER): Payer: Medicare Other | Admitting: *Deleted

## 2016-09-20 DIAGNOSIS — I255 Ischemic cardiomyopathy: Secondary | ICD-10-CM | POA: Diagnosis not present

## 2016-09-20 LAB — CUP PACEART REMOTE DEVICE CHECK
Brady Statistic AP VS Percent: 21.35 %
Brady Statistic AS VP Percent: 41.16 %
Brady Statistic AS VS Percent: 15.78 %
HIGH POWER IMPEDANCE MEASURED VALUE: 41 Ohm
HighPow Impedance: 50 Ohm
Implantable Lead Implant Date: 20110601
Implantable Lead Location: 753860
Implantable Lead Model: 5076
Implantable Lead Model: 6947
Lead Channel Impedance Value: 247 Ohm
Lead Channel Impedance Value: 342 Ohm
Lead Channel Sensing Intrinsic Amplitude: 1.5 mV
Lead Channel Sensing Intrinsic Amplitude: 1.5 mV
Lead Channel Sensing Intrinsic Amplitude: 1.5 mV
Lead Channel Setting Pacing Amplitude: 1.5 V
Lead Channel Setting Pacing Pulse Width: 0.4 ms
Lead Channel Setting Sensing Sensitivity: 0.3 mV
MDC IDC LEAD IMPLANT DT: 20110601
MDC IDC LEAD LOCATION: 753859
MDC IDC MSMT BATTERY REMAINING LONGEVITY: 92 mo
MDC IDC MSMT BATTERY VOLTAGE: 3.03 V
MDC IDC MSMT LEADCHNL RA IMPEDANCE VALUE: 475 Ohm
MDC IDC MSMT LEADCHNL RA PACING THRESHOLD AMPLITUDE: 0.75 V
MDC IDC MSMT LEADCHNL RA PACING THRESHOLD PULSEWIDTH: 0.4 ms
MDC IDC MSMT LEADCHNL RA SENSING INTR AMPL: 1.5 mV
MDC IDC MSMT LEADCHNL RV PACING THRESHOLD AMPLITUDE: 1.375 V
MDC IDC MSMT LEADCHNL RV PACING THRESHOLD PULSEWIDTH: 0.4 ms
MDC IDC PG IMPLANT DT: 20171213
MDC IDC SESS DTM: 20180614062823
MDC IDC SET LEADCHNL RV PACING AMPLITUDE: 3.5 V
MDC IDC STAT BRADY AP VP PERCENT: 21.72 %
MDC IDC STAT BRADY RA PERCENT PACED: 37.45 %
MDC IDC STAT BRADY RV PERCENT PACED: 62.36 %

## 2016-09-20 NOTE — Progress Notes (Signed)
Remote ICD transmission.   

## 2016-09-21 ENCOUNTER — Encounter: Payer: Self-pay | Admitting: Cardiology

## 2016-10-01 DIAGNOSIS — L821 Other seborrheic keratosis: Secondary | ICD-10-CM | POA: Diagnosis not present

## 2016-10-01 DIAGNOSIS — L570 Actinic keratosis: Secondary | ICD-10-CM | POA: Diagnosis not present

## 2016-10-01 DIAGNOSIS — Z85828 Personal history of other malignant neoplasm of skin: Secondary | ICD-10-CM | POA: Diagnosis not present

## 2016-10-01 DIAGNOSIS — D485 Neoplasm of uncertain behavior of skin: Secondary | ICD-10-CM | POA: Diagnosis not present

## 2016-10-01 DIAGNOSIS — D1801 Hemangioma of skin and subcutaneous tissue: Secondary | ICD-10-CM | POA: Diagnosis not present

## 2016-10-01 DIAGNOSIS — D225 Melanocytic nevi of trunk: Secondary | ICD-10-CM | POA: Diagnosis not present

## 2016-10-05 ENCOUNTER — Encounter: Payer: Self-pay | Admitting: Cardiology

## 2016-11-05 ENCOUNTER — Other Ambulatory Visit: Payer: Self-pay | Admitting: Cardiovascular Disease

## 2016-11-12 ENCOUNTER — Other Ambulatory Visit: Payer: Self-pay | Admitting: Cardiovascular Disease

## 2016-11-12 DIAGNOSIS — F419 Anxiety disorder, unspecified: Secondary | ICD-10-CM

## 2016-11-13 NOTE — Telephone Encounter (Signed)
[name] requesting a refill on diazepam 5 mg tablet. Would you like to refill this medication? Please advise

## 2016-11-15 NOTE — Telephone Encounter (Signed)
Ok to refill. [name]

## 2016-12-04 DIAGNOSIS — H401231 Low-tension glaucoma, bilateral, mild stage: Secondary | ICD-10-CM | POA: Diagnosis not present

## 2016-12-04 DIAGNOSIS — H04129 Dry eye syndrome of unspecified lacrimal gland: Secondary | ICD-10-CM | POA: Diagnosis not present

## 2016-12-04 DIAGNOSIS — Z961 Presence of intraocular lens: Secondary | ICD-10-CM | POA: Diagnosis not present

## 2016-12-04 DIAGNOSIS — H02834 Dermatochalasis of left upper eyelid: Secondary | ICD-10-CM | POA: Diagnosis not present

## 2016-12-20 ENCOUNTER — Ambulatory Visit (INDEPENDENT_AMBULATORY_CARE_PROVIDER_SITE_OTHER): Payer: Medicare Other | Admitting: *Deleted

## 2016-12-20 DIAGNOSIS — I469 Cardiac arrest, cause unspecified: Secondary | ICD-10-CM | POA: Diagnosis not present

## 2016-12-20 NOTE — Progress Notes (Signed)
Remote ICD transmission.   

## 2016-12-21 ENCOUNTER — Other Ambulatory Visit: Payer: Medicare Other | Admitting: *Deleted

## 2016-12-21 ENCOUNTER — Encounter: Payer: Self-pay | Admitting: Cardiology

## 2016-12-21 DIAGNOSIS — E7800 Pure hypercholesterolemia, unspecified: Secondary | ICD-10-CM

## 2016-12-21 LAB — COMPREHENSIVE METABOLIC PANEL
A/G RATIO: 1.8 (ref 1.2–2.2)
ALT: 14 IU/L (ref 0–44)
AST: 18 IU/L (ref 0–40)
Albumin: 4.4 g/dL (ref 3.5–4.7)
Alkaline Phosphatase: 62 IU/L (ref 39–117)
BUN/Creatinine Ratio: 25 — ABNORMAL HIGH (ref 10–24)
BUN: 29 mg/dL — ABNORMAL HIGH (ref 8–27)
Bilirubin Total: 0.5 mg/dL (ref 0.0–1.2)
CALCIUM: 10.3 mg/dL — AB (ref 8.6–10.2)
CO2: 25 mmol/L (ref 20–29)
CREATININE: 1.15 mg/dL (ref 0.76–1.27)
Chloride: 104 mmol/L (ref 96–106)
GFR, EST AFRICAN AMERICAN: 69 mL/min/{1.73_m2} (ref >59)
GFR, EST NON AFRICAN AMERICAN: 60 mL/min/{1.73_m2} (ref >59)
Globulin, Total: 2.5 g/dL (ref 1.5–4.5)
Glucose: 95 mg/dL (ref 65–99)
POTASSIUM: 5.2 mmol/L (ref 3.5–5.2)
Sodium: 144 mmol/L (ref 134–144)
TOTAL PROTEIN: 6.9 g/dL (ref 6.0–8.5)

## 2016-12-21 LAB — LIPID PANEL
CHOL/HDL RATIO: 3.7 ratio (ref 0.0–5.0)
Cholesterol, Total: 168 mg/dL (ref 100–199)
HDL: 45 mg/dL (ref >39)
LDL CALC: 90 mg/dL (ref 0–99)
TRIGLYCERIDES: 163 mg/dL — AB (ref 0–149)
VLDL Cholesterol Cal: 33 mg/dL (ref 5–40)

## 2016-12-21 LAB — CUP PACEART REMOTE DEVICE CHECK
Battery Remaining Longevity: 101 mo
Battery Voltage: 3.02 V
Brady Statistic AS VS Percent: 18.21 %
Brady Statistic RV Percent Paced: 3.22 %
Date Time Interrogation Session: 20180913062823
HighPow Impedance: 41 Ohm
HighPow Impedance: 49 Ohm
Implantable Lead Implant Date: 20110601
Implantable Lead Location: 753860
Implantable Pulse Generator Implant Date: 20171213
Lead Channel Impedance Value: 285 Ohm
Lead Channel Impedance Value: 342 Ohm
Lead Channel Impedance Value: 456 Ohm
Lead Channel Pacing Threshold Pulse Width: 0.4 ms
Lead Channel Sensing Intrinsic Amplitude: 1 mV
Lead Channel Sensing Intrinsic Amplitude: 1.25 mV
Lead Channel Setting Pacing Amplitude: 1.75 V
Lead Channel Setting Pacing Pulse Width: 0.4 ms
Lead Channel Setting Sensing Sensitivity: 0.3 mV
MDC IDC LEAD IMPLANT DT: 20110601
MDC IDC LEAD LOCATION: 753859
MDC IDC MSMT LEADCHNL RA PACING THRESHOLD AMPLITUDE: 0.875 V
MDC IDC MSMT LEADCHNL RA SENSING INTR AMPL: 1 mV
MDC IDC MSMT LEADCHNL RV PACING THRESHOLD AMPLITUDE: 1.375 V
MDC IDC MSMT LEADCHNL RV PACING THRESHOLD PULSEWIDTH: 0.4 ms
MDC IDC MSMT LEADCHNL RV SENSING INTR AMPL: 1.25 mV
MDC IDC SET LEADCHNL RV PACING AMPLITUDE: 2.75 V
MDC IDC STAT BRADY AP VP PERCENT: 3.02 %
MDC IDC STAT BRADY AP VS PERCENT: 78.72 %
MDC IDC STAT BRADY AS VP PERCENT: 0.05 %
MDC IDC STAT BRADY RA PERCENT PACED: 80.66 %

## 2016-12-24 ENCOUNTER — Encounter: Payer: Self-pay | Admitting: Cardiovascular Disease

## 2016-12-24 ENCOUNTER — Ambulatory Visit (INDEPENDENT_AMBULATORY_CARE_PROVIDER_SITE_OTHER): Payer: Medicare Other | Admitting: Cardiovascular Disease

## 2016-12-24 VITALS — BP 126/80 | HR 67 | Ht 66.0 in | Wt 160.8 lb

## 2016-12-24 DIAGNOSIS — Z952 Presence of prosthetic heart valve: Secondary | ICD-10-CM | POA: Diagnosis not present

## 2016-12-24 DIAGNOSIS — I481 Persistent atrial fibrillation: Secondary | ICD-10-CM

## 2016-12-24 DIAGNOSIS — E784 Other hyperlipidemia: Secondary | ICD-10-CM | POA: Diagnosis not present

## 2016-12-24 DIAGNOSIS — I350 Nonrheumatic aortic (valve) stenosis: Secondary | ICD-10-CM

## 2016-12-24 DIAGNOSIS — I4819 Other persistent atrial fibrillation: Secondary | ICD-10-CM

## 2016-12-24 DIAGNOSIS — E7849 Other hyperlipidemia: Secondary | ICD-10-CM

## 2016-12-24 DIAGNOSIS — I2510 Atherosclerotic heart disease of native coronary artery without angina pectoris: Secondary | ICD-10-CM

## 2016-12-24 DIAGNOSIS — I255 Ischemic cardiomyopathy: Secondary | ICD-10-CM | POA: Diagnosis not present

## 2016-12-24 MED ORDER — ROSUVASTATIN CALCIUM 40 MG PO TABS: 40.0000 mg | ORAL_TABLET | Freq: Every day | ORAL | 3 refills | Status: DC

## 2016-12-24 MED ORDER — AMOXICILLIN 500 MG PO TABS: ORAL_TABLET | ORAL | 4 refills | Status: DC

## 2016-12-24 MED ORDER — SILDENAFIL CITRATE 20 MG PO TABS: ORAL_TABLET | ORAL | 3 refills | Status: DC

## 2016-12-24 NOTE — Patient Instructions (Addendum)
Medication Instructions:  Your physician has recommended you make the following change in your medication:  Increase Rosuvastatin to 40 mg by mouth daily.  Take Amoxicillin 2000 mg by mouth 60 minutes prior to any dental appointment.     Labwork: Your physician recommends that you return for lab work in: 12 weeks.  Scheduled for December 11,2018.  This is fasting (lipid and Liver profiles).  The lab opens at 7:30 AM   Testing/Procedures: none  Follow-Up: Your physician recommends that you schedule a follow-up appointment in: 6 months. Please call our office in about 3 months to schedule this appointment.     Any Other Special Instructions Will Be Listed Below (If Applicable).  Stop taking Xarelto 3 days prior to dental extraction.    If you need a refill on your cardiac medications before your next appointment, please call your pharmacy.

## 2016-12-24 NOTE — Progress Notes (Signed)
Chief Complaint  Patient presents with  . Follow-up    CAD     History of Present Illness: 83 yo male with history of CAD s/p CABG (1982 with redo 1994), ischemic cardiomyopathy, out-of-hospital cardiac arrest in 2011 with successful resuscitation and subsequent placement of Medtronic ICD, and aortic stenosis s/p TAVR who is here today for cardiac follow up. I performed his TAVR procedure on 03/23/14. His cardiac issues had been followed by Dr. Mare Ferrari.  Echo January 2017 with preserved LV systolic function with normally functioning bioprosthetic aortic valve. There is trivial AI. Cardiac cath October 2015 with total occlusion of left main and RCA. Patent LIMA to LAD, patent SVG to OM, patent SVG to RCA. Of note, diffuse disease in LAD both before and after LIMA graft insertion.   He is here today for follow up of his CAD and aortic valve disease. The patient denies any chest pain, dyspnea, palpitations, lower extremity edema, orthopnea, PND, dizziness, near syncope or syncope. He is still playing golf once per week. He is going to the gym. He uses the treadmill 15 minutes per day and uses weights. He has a tooth that needs to be extracted.   Primary Care Physician: Binnie Rail, MD   Past Medical History:  Diagnosis Date  . Aortic stenosis, severe    s/p TAVR with Edwards Sapien 3 THV (size 26 mm, model # 9600TFX, serial # [id number])  . Arthritis   . Cardiac arrest ([place])   . Claudication ([place])   . Coronary artery disease   . Essential hypertension   . Headache   . History of hiatal hernia   . Hyperlipidemia   . Kidney stones   . Peripheral vascular disease [facility])     Past Surgical History:  Procedure Laterality Date  . ADENOIDECTOMY    . APPENDECTOMY    . CARDIAC CATHETERIZATION   11/16/2009   . CARDIAC DEFIBRILLATOR PLACEMENT    . cataract surgery      Bilateral cataract surgery  . CORONARY ARTERY BYPASS GRAFT     in 1982 and '84 with subsequent PTCI  . EP IMPLANTABLE  DEVICE N/A 03/21/2016   Procedure: ICD Generator Changeout;  Surgeon: Deboraha Sprang, MD;  Location: [place] CV LAB;  Service: Cardiovascular;  Laterality: N/A;  . HERNIA REPAIR    . LEFT AND RIGHT HEART CATHETERIZATION WITH CORONARY/GRAFT ANGIOGRAM N/A 02/01/2014   Procedure: LEFT AND RIGHT HEART CATHETERIZATION WITH Beatrix Fetters;  Surgeon: Sinclair Grooms, MD;  Location: [facility] CATH LAB;  Service: Cardiovascular;  Laterality: N/A;  . left elbow tendon surgery    . TEE WITHOUT CARDIOVERSION N/A 03/23/2014   Procedure: TRANSESOPHAGEAL ECHOCARDIOGRAM (TEE);  Surgeon: Burnell Blanks, MD;  Location: [place];  Service: Open Heart Surgery;  Laterality: N/A;  . TONSILLECTOMY    . TRANSCATHETER AORTIC VALVE REPLACEMENT, TRANSFEMORAL N/A 03/23/2014   Procedure: TRANSCATHETER AORTIC VALVE REPLACEMENT, TRANSFEMORAL;  Surgeon: Burnell Blanks, MD;  Location: [place];  Service: Open Heart Surgery;  Laterality: N/A;    Current Outpatient Prescriptions  Medication Sig Dispense Refill  . acetaminophen (TYLENOL) 325 MG tablet Take 650 mg by mouth daily as needed for moderate pain or headache.    Marland Kitchen amLODipine (NORVASC) 5 MG tablet Take 1 tablet (5 mg total) by mouth daily. 90 tablet 3  . calcium carbonate (TUMS - DOSED IN MG ELEMENTAL CALCIUM) 500 MG chewable tablet Chew 2 tablets by mouth daily as needed for indigestion or heartburn.    Marland Kitchen  Coenzyme Q10 (COQ10 PO) Take 1 Dose by mouth at bedtime. Take 150mg  by mouth every night (liquid)    . diazepam (VALIUM) 5 MG tablet TAKE 1 TABLET BY MOUTH DAILY AS NEEDED 30 tablet 2  . furosemide (LASIX) 40 MG tablet Take 1 tablet (40 mg total) by mouth daily. 90 tablet 3  . KRILL OIL PO Take 1 tablet by mouth daily.    Marland Kitchen latanoprost (XALATAN) 0.005 % ophthalmic solution Place 1 drop into both eyes at bedtime.     Marland Kitchen lisinopril (PRINIVIL,ZESTRIL) 20 MG tablet TAKE 1 TABLET (20 MG TOTAL) BY MOUTH DAILY. 90 tablet 3  . Magnesium Oxide (MAG-OXIDE PO)  Take 1 tablet by mouth at bedtime.     . metoprolol (LOPRESSOR) 50 MG tablet TAKE 1/2 TABLET BY MOUTH EVERY EVENING. 45 tablet 3  . metoprolol tartrate (LOPRESSOR) 100 MG tablet Take 0.5 tablets (50 mg total) by mouth every morning. 45 tablet 2  . Multiple Vitamin (MULTIVITAMIN) tablet Take 1 tablet by mouth at bedtime.     Marland Kitchen MYRBETRIQ 50 MG TB24 tablet Take 1 tablet by mouth every morning.  3  . naphazoline-pheniramine (NAPHCON-A) 0.025-0.3 % ophthalmic solution Place 1 drop into both eyes daily as needed for irritation.    . nitroGLYCERIN (NITROSTAT) 0.4 MG SL tablet Place 1 tablet (0.4 mg total) under the tongue every 5 (five) minutes as needed for chest pain (for chest pain (MAX of 3 doses)). 25 tablet 6  . potassium chloride (K-DUR,KLOR-CON) 10 MEQ tablet Take 10 mEq by mouth 2 (two) times daily.      . sildenafil (REVATIO) 20 MG tablet Take 2-3 tablets by mouth daily as needed 30 tablet 3  . Thiamine HCl (VITAMIN B-1) 100 MG tablet Take 100 mg by mouth daily.      Alveda Reasons 20 MG TABS tablet TAKE 1 TABLET BY MOUTH DAILY WITH SUPPER 30 tablet 5  . amoxicillin (AMOXIL) 500 MG tablet Take 4 tablets by mouth 60 minutes prior to dental appointments 4 tablet 4  . rosuvastatin (CRESTOR) 40 MG tablet Take 1 tablet (40 mg total) by mouth daily. 90 tablet 3   No current facility-administered medications for this visit.     Allergies  Allergen Reactions  . Cyclobenzaprine Hcl Other (See Comments)    Unspecified reaction pt doesn't know what reaction  . Tetracycline     Rash     Social History   Social History  . Marital status: Married    Spouse name: N/A  . Number of children: 3  . Years of education: N/A   Occupational History  . Retired    Social History Main Topics  . Smoking status: Former Smoker    Quit date: 10/25/1952  . Smokeless tobacco: Never Used  . Alcohol use 3.0 - 3.6 oz/week    5 - 6 Glasses of wine per week     Comment: 5-6 times a week  . Drug use: No  .  Sexual activity: Not on file   Other Topics Concern  . Not on file   Social History Narrative  . No narrative on file    Family History  Problem Relation Age of Onset  . Heart attack Father 70  . Coronary artery disease Other        family history is dignificant for early CAD in several members    Review of Systems:  As stated in the HPI and otherwise negative.   BP 126/80   Pulse 67  Ht 5\' 6"  (1.676 m)   Wt 160 lb 12.8 oz (72.9 kg)   SpO2 94%   BMI 25.95 kg/m   Physical Examination:  General: Well developed, well nourished, NAD  HEENT: OP clear, mucus membranes moist  SKIN: warm, dry. No rashes. Neuro: No focal deficits  Musculoskeletal: Muscle strength 5/5 all ext  Psychiatric: Mood and affect normal  Neck: No JVD, no carotid bruits, no thyromegaly, no lymphadenopathy.  Lungs:Clear bilaterally, no wheezes, rhonci, crackles Cardiovascular:Irreg irreg. No murmurs, gallops or rubs. Abdomen:Soft. Bowel sounds present. Non-tender.  Extremities: No lower extremity edema. Pulses are 2 + in the bilateral DP/PT.  Echo January 2017: Left ventricle: Posterior basal hypokinesis The cavity size was   normal. Wall thickness was normal. Systolic function was normal.   The estimated ejection fraction was in the range of 50% to 55%. - Aortic valve: Post TAVR with 26 mm Sapien 3 valve No perivalvular   regurgitation. - Left atrium: The atrium was moderately dilated. - Atrial septum: No defect or patent foramen ovale was identified.  EKG:  EKG is not   ordered today. The ekg ordered today demonstrates   Recent Labs: 03/14/2016: Hemoglobin 15.0; Platelets 152 12/21/2016: ALT 14; BUN 29; Creatinine, Ser 1.15; Potassium 5.2; Sodium 144   Lipid Panel    Component Value Date/Time   CHOL 168 12/21/2016 0914   TRIG 163 (H) 12/21/2016 0914   HDL 45 12/21/2016 0914   CHOLHDL 3.7 12/21/2016 0914   CHOLHDL 3.1 12/05/2015 1002   VLDL 18 12/05/2015 1002   LDLCALC 90 12/21/2016 0914       Wt Readings from Last 3 Encounters:  12/24/16 160 lb 12.8 oz (72.9 kg)  06/21/16 160 lb (72.6 kg)  06/11/16 158 lb 6.4 oz (71.8 kg)     Other studies Reviewed: Additional studies/ records that were reviewed today include: . Review of the above records demonstrates:    Assessment and Plan:   1. Severe Aortic valve stenosis: He is s/p TAVR December 2015. He is on Xarelto for atrial fibrillation. He knows to use antibiotics before dental visits.  He has a tooth extraction soon. He will hold Xarelto 3 days prior to the procedure. He is given prescription for amoxicillin today to use the day of the procedure.   2. CAD s/p CABG: He had CABG in 1982 and redo CABG in 1994. Last cath October 2015 with stable diffuse disease. He is having no chest pain suggestive of angina. Will continue statin and beta blocker. He has stopped taking ASA since he is on Eliquis.    3. Atrial fibrillation, persistent: Rate controlled on beta blocker. Continue Xarelto.  4. History of cardiac arrest: ICD in place.    5. HLD: Continue statin. LDL 90. Will increase Crestor to 40 mg daily for goal LDL of 70. Repeat lipids and LFTs in 12 weeks.    Current medicines are reviewed at length with the patient today.  The patient does not have concerns regarding medicines.  The following changes have been made:  no change  Labs/ tests ordered today include:   Orders Placed This Encounter  Procedures  . Lipid Profile  . Hepatic function panel     Disposition:   FU with me in 6 months.      Signed, Lauree Chandler, MD 12/24/2016 10:26 AM    [facility] [address], [place], [place]  [address] Phone: [phone]; Fax: [phone]

## 2016-12-25 ENCOUNTER — Other Ambulatory Visit: Payer: Self-pay | Admitting: Cardiovascular Disease

## 2017-01-25 ENCOUNTER — Ambulatory Visit (INDEPENDENT_AMBULATORY_CARE_PROVIDER_SITE_OTHER): Payer: Medicare Other

## 2017-01-25 DIAGNOSIS — Z23 Encounter for immunization: Secondary | ICD-10-CM | POA: Diagnosis not present

## 2017-01-29 ENCOUNTER — Other Ambulatory Visit: Payer: Self-pay | Admitting: Cardiovascular Disease

## 2017-02-09 ENCOUNTER — Other Ambulatory Visit: Payer: Self-pay | Admitting: Nurse Practitioner

## 2017-02-11 ENCOUNTER — Other Ambulatory Visit: Payer: Self-pay | Admitting: Nurse Practitioner

## 2017-02-12 NOTE — Telephone Encounter (Signed)
Medication Detail    Disp Refills Start End   metoprolol tartrate (LOPRESSOR) 50 MG tablet 45 tablet 3 02/11/2017    Sig: TAKE 1/2 TABLET BY MOUTH EVERY EVENING.   Sent to pharmacy as: metoprolol tartrate (LOPRESSOR) 50 MG tablet   E-Prescribing Status: Receipt confirmed by pharmacy (02/11/2017 11:53 AM EST)   Pharmacy   [place], [place]

## 2017-03-04 ENCOUNTER — Telehealth: Payer: Self-pay | Admitting: Cardiovascular Disease

## 2017-03-04 NOTE — Telephone Encounter (Signed)
I spoke with pt and his wife. They report pt has been having problems with erectile dysfunction. Being followed at [facility] clinic and has low testosterone.  Has received one injection for this but is continuing to have problems with erectile dysfunction.  Clinic has recommend pt have a P shot.  Wife states this involves thinning pt's blood and injecting it back into pt. Pt and wife are asking if OK with Dr. Angelena Form to have this.  I asked them to have clinic send [place] specific information about injection and Dr. Angelena Form could then evaluate.

## 2017-03-04 NOTE — Telephone Encounter (Signed)
[name] is wanting to speak with you about an appt he has to make w/another doctor , but wants to speak with you appt first

## 2017-03-06 ENCOUNTER — Other Ambulatory Visit: Payer: Self-pay | Admitting: Internal Medicine

## 2017-03-07 NOTE — Telephone Encounter (Signed)
Information received in office from pt.  Dr. Angelena Form reviewed information and recommends pt not have this injection.  I spoke with pt and gave him this information.  Pt is going to urology soon and I asked him to discuss other possible treatment plans at this visit.

## 2017-03-12 ENCOUNTER — Ambulatory Visit: Payer: Self-pay | Admitting: *Deleted

## 2017-03-12 NOTE — Telephone Encounter (Signed)
  Reason for Disposition . [1] Fever AND [2] no signs of serious infection or localizing symptoms (all other triage questions negative)  Answer Assessment - Initial Assessment Questions 1. TEMPERATURE: "What is the most recent temperature?"  "How was it measured?"      101-oral thermometer 2. ONSET: "When did the fever start?"      Today about 2 hours ago fever was 101.7 3. SYMPTOMS: "Do you have any other symptoms besides the fever?"  (e.g., colds, headache, sore throat, earache, cough, rash, diarrhea, vomiting, abdominal pain)     No 4. CAUSE: If there are no symptoms, ask: "What do you think is causing the fever?"      Unsure, pt's wife thinks he may be coming down with the flu 5. CONTACTS: "Does anyone else in the family have an infection?"     No 6. TREATMENT: "What have you done so far to treat this fever?" (e.g., medications)     Tylenol 650mg  given about an hour ago 7. IMMUNOCOMPROMISE: "Do you have of the following: diabetes, HIV positive, splenectomy, cancer chemotherapy, chronic steroid treatment, transplant patient, etc."     Valve replacement approximately 2 years ago 8. TRAVEL: "Have you traveled out of the country in the last month?" (e.g., travel history, exposures)     No  Protocols used: FEVER-A-AH  Pt's wife Berenice Bouton states that the pt has complaints of generalized aches, weakness, and shaking all over,  and states his head "feels like a balloon." Pt's wife states that he has had shaking all over but denies the pt having sore throat, congestion, runny nose. Pt's wife states that the pt has a non productive cough but it is not acute and thinks it is due to some of the medications he takes.  Pt's wife states that the pt went to the gym to work out yesterday and initially thought he "over did it", before checking to see pt had a temperature. Advised pt's wife along with pt who was beside her to recheck temperature in about 43min to see if decreased. Informed pt's wife to call  office back if the pt does not have a decrease in temperature or other symptoms develop. Pt's wife verbalized understanding.

## 2017-03-13 ENCOUNTER — Ambulatory Visit (INDEPENDENT_AMBULATORY_CARE_PROVIDER_SITE_OTHER): Payer: Medicare Other

## 2017-03-13 ENCOUNTER — Ambulatory Visit (INDEPENDENT_AMBULATORY_CARE_PROVIDER_SITE_OTHER): Payer: Medicare Other | Admitting: Physician Assistant

## 2017-03-13 ENCOUNTER — Encounter: Payer: Self-pay | Admitting: Physician Assistant

## 2017-03-13 VITALS — BP 126/80 | HR 79 | Temp 99.3°F | Resp 17 | Ht 66.0 in | Wt 160.0 lb

## 2017-03-13 DIAGNOSIS — Z87438 Personal history of other diseases of male genital organs: Secondary | ICD-10-CM

## 2017-03-13 DIAGNOSIS — I255 Ischemic cardiomyopathy: Secondary | ICD-10-CM | POA: Diagnosis not present

## 2017-03-13 DIAGNOSIS — R82998 Other abnormal findings in urine: Secondary | ICD-10-CM | POA: Diagnosis not present

## 2017-03-13 DIAGNOSIS — R509 Fever, unspecified: Secondary | ICD-10-CM

## 2017-03-13 DIAGNOSIS — R52 Pain, unspecified: Secondary | ICD-10-CM

## 2017-03-13 LAB — POCT CBC
GRANULOCYTE PERCENT: 95.1 % — AB (ref 37–80)
HEMATOCRIT: 38.6 % — AB (ref 43.5–53.7)
Hemoglobin: 13 g/dL — AB (ref 14.1–18.1)
Lymph, poc: 0.6 (ref 0.6–3.4)
MCH, POC: 32.1 pg — AB (ref 27–31.2)
MCHC: 33.7 g/dL (ref 31.8–35.4)
MCV: 95.2 fL (ref 80–97)
MID (CBC): 0.3 (ref 0–0.9)
MPV: 8.6 fL (ref 0–99.8)
PLATELET COUNT, POC: 109 10*3/uL — AB (ref 142–424)
POC GRANULOCYTE: 16.3 — AB (ref 2–6.9)
POC LYMPH %: 3.3 % — AB (ref 10–50)
POC MID %: 1.6 % (ref 0–12)
RBC: 4.05 M/uL — AB (ref 4.69–6.13)
RDW, POC: 13.2 %
WBC: 17.1 10*3/uL — AB (ref 4.6–10.2)

## 2017-03-13 LAB — POCT URINALYSIS DIP (MANUAL ENTRY)
BILIRUBIN UA: NEGATIVE mg/dL
Bilirubin, UA: NEGATIVE
Blood, UA: NEGATIVE
Nitrite, UA: NEGATIVE
SPEC GRAV UA: 1.025 (ref 1.010–1.025)
Urobilinogen, UA: 0.2 E.U./dL
pH, UA: 5.5 (ref 5.0–8.0)

## 2017-03-13 LAB — POC INFLUENZA A&B (BINAX/QUICKVUE)
Influenza A, POC: NEGATIVE
Influenza B, POC: NEGATIVE

## 2017-03-13 MED ORDER — CEFTRIAXONE SODIUM 1 G IJ SOLR
1.0000 g | Freq: Once | INTRAMUSCULAR | Status: AC
  Administered 2017-03-13: 1 g via INTRAMUSCULAR

## 2017-03-13 MED ORDER — CIPROFLOXACIN HCL 500 MG PO TABS: 500.0000 mg | ORAL_TABLET | Freq: Two times a day (BID) | ORAL | 0 refills | Status: DC

## 2017-03-13 NOTE — Patient Instructions (Addendum)
You must hydrate well tonight.  Make sure you are drinking at least 3 bottles of water. Prostatitis Prostatitis is swelling of the prostate gland. The prostate helps to make semen. It is below a man's bladder, in front of the rectum. There are different types of prostatitis. Follow these instructions at home:  Take over-the-counter and prescription medicines only as told by your doctor.  If you were prescribed an antibiotic medicine, take it as told by your doctor. Do not stop taking the antibiotic even if you start to feel better.  If your doctor prescribed exercises, do them as directed.  Take sitz baths as told by your doctor. To take a sitz bath, sit in warm water that is deep enough to cover your hips and butt.  Keep all follow-up visits as told by your doctor. This is important. Contact a doctor if:  Your symptoms get worse.  You have a fever. Get help right away if:  You have chills.  You feel sick to your stomach (nauseous).  You throw up (vomit).  You feel light-headed.  You feel like you might pass out (faint).  You cannot pee (urinate).  You have blood or clumps of blood (blood clots) in your pee (urine). This information is not intended to replace advice given to you by your health care provider. Make sure you discuss any questions you have with your health care provider. Document Released: 09/25/2011 Document Revised: 12/15/2015 Document Reviewed: 12/15/2015 Elsevier Interactive Patient Education  2017 [employer].     IF you received an x-ray today, you will receive an invoice from [facility]. Please contact [facility] at [phone] with questions or concerns regarding your invoice.   IF you received labwork today, you will receive an invoice from [facility]. Please contact [facility] at [phone] with questions or concerns regarding your invoice.   Our billing staff will not be able to assist you with questions regarding bills from  these companies.  You will be contacted with the lab results as soon as they are available. The fastest way to get your results is to activate your My Chart account. Instructions are located on the last page of this paperwork. If you have not heard from [place] regarding the results in 2 weeks, please contact this office.

## 2017-03-13 NOTE — Progress Notes (Addendum)
PRIMARY CARE AT [place], [place] [address] [phone]  Date:  03/13/2017   Name:  [name]   DOB:  14-May-1933   MRN:  [id number]  PCP:  Binnie Rail, MD    History of Present Illness:  [name] is a 83 y.o. male patient who presents to PCP with  Chief Complaint  Patient presents with  . Fever  . Generalized Body Aches     2 days ago, he had bodyaches and lethargic.  He had temperature of 101.7,  With tylenol went ot 100.4 Shivering and malaise.  Headache yesterday.   No sore throat, cough, or ear pain.  No nasal congestion.  No sob or dyspnea.  He denies any dizziness.  No abdominal pain, nausea, dysuria, or frequency.   He denies any diarrhea or black or bloody stool Last urination was about 2 hours prior to appointment, and he notes he urinated this morning without difficulty He wears depends with incontinence that has been chronic.   He did work out    Patient Active Problem List   Diagnosis Date Noted  . Acute upper respiratory infection 03/02/2016  . Acute pharyngitis 08/25/2015  . Sleep difficulties 07/29/2015  . Erectile disorder due to medical condition in male patient 12/17/2014  . Severe aortic valve stenosis - s/p  Edwards Sapien 3 THV (size 26 mm, model # 9600TFX, serial # [id number]) 03/23/2014 03/23/2014  . Abnormal nuclear stress test 01/29/2014  . Ischemic cardiomyopathy 02-Jan-2012  . 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 Back pain 07/10/2010  . Hypercholesteremia 07/10/2010  . Benign hypertensive heart disease without heart failure 07/10/2010  . S/P CABG (coronary artery bypass graft) 07/10/2010  . Implantable cardioverter-defibrillator (ICD) in situ 03/30/2010  . Atrial fibrillation ([place]) 2020/08/3109    Past Medical History:  Diagnosis Date  . Aortic stenosis, severe    s/p TAVR with Edwards Sapien 3 THV (size 26 mm, model # 9600TFX, serial # [id number])  . Arthritis   . Cardiac arrest ([place])   . Claudication ([place])    . Coronary artery disease   . Essential hypertension   . Headache   . History of hiatal hernia   . Hyperlipidemia   . Kidney stones   . Peripheral vascular disease [facility])     Past Surgical History:  Procedure Laterality Date  . ADENOIDECTOMY    . APPENDECTOMY    . CARDIAC CATHETERIZATION   11/16/2009   . CARDIAC DEFIBRILLATOR PLACEMENT    . cataract surgery      Bilateral cataract surgery  . CORONARY ARTERY BYPASS GRAFT     in 1982 and '84 with subsequent PTCI  . EP IMPLANTABLE DEVICE N/A 03/21/2016   Procedure: ICD Generator Changeout;  Surgeon: Deboraha Sprang, MD;  Location: [facility] CV LAB;  Service: Cardiovascular;  Laterality: N/A;  . HERNIA REPAIR    . LEFT AND RIGHT HEART CATHETERIZATION WITH CORONARY/GRAFT ANGIOGRAM N/A 02/01/2014   Procedure: LEFT AND RIGHT HEART CATHETERIZATION WITH Beatrix Fetters;  Surgeon: Sinclair Grooms, MD;  Location: [facility] CATH LAB;  Service: Cardiovascular;  Laterality: N/A;  . left elbow tendon surgery    . TEE WITHOUT CARDIOVERSION N/A 03/23/2014   Procedure: TRANSESOPHAGEAL ECHOCARDIOGRAM (TEE);  Surgeon: Burnell Blanks, MD;  Location: [facility];  Service: Open Heart Surgery;  Laterality: N/A;  . TONSILLECTOMY    . TRANSCATHETER AORTIC VALVE REPLACEMENT, TRANSFEMORAL N/A 03/23/2014   Procedure: TRANSCATHETER AORTIC VALVE REPLACEMENT, TRANSFEMORAL;  Surgeon: Harrell Gave  Santina Evans, MD;  Location: [facility];  Service: Open Heart Surgery;  Laterality: N/A;    Social History   Tobacco Use  . Smoking status: Former Smoker    Last attempt to quit: 10/25/1952    Years since quitting: 64.4  . Smokeless tobacco: Never Used  Substance Use Topics  . Alcohol use: Yes    Alcohol/week: 3.0 - 3.6 oz    Types: 5 - 6 Glasses of wine per week    Comment: 5-6 times a week  . Drug use: No    Family History  Problem Relation Age of Onset  . Heart attack Father 60  . Coronary artery disease Other        family history is dignificant for  early CAD in several members    Allergies  Allergen Reactions  . Cyclobenzaprine Hcl Other (See Comments)    Unspecified reaction pt doesn't know what reaction  . Tetracycline     Rash     Medication list has been reviewed and updated.  Current Outpatient Medications on File Prior to Visit  Medication Sig Dispense Refill  . acetaminophen (TYLENOL) 325 MG tablet Take 650 mg by mouth daily as needed for moderate pain or headache.    Marland Kitchen amLODipine (NORVASC) 5 MG tablet TAKE 1 TABLET BY MOUTH DAILY. 90 tablet 3  . amoxicillin (AMOXIL) 500 MG tablet Take 4 tablets by mouth 60 minutes prior to dental appointments 4 tablet 4  . calcium carbonate (TUMS - DOSED IN MG ELEMENTAL CALCIUM) 500 MG chewable tablet Chew 2 tablets by mouth daily as needed for indigestion or heartburn.    . Coenzyme Q10 (COQ10 PO) Take 1 Dose by mouth at bedtime. Take 141m by mouth every night (liquid)    . diazepam (VALIUM) 5 MG tablet TAKE 1 TABLET BY MOUTH DAILY AS NEEDED 30 tablet 2  . furosemide (LASIX) 40 MG tablet TAKE 1 TABLET BY MOUTH DAILY. 90 tablet 1  . KRILL OIL PO Take 1 tablet by mouth daily.    .Marland Kitchenlatanoprost (XALATAN) 0.005 % ophthalmic solution Place 1 drop into both eyes at bedtime.     .Marland Kitchenlisinopril (PRINIVIL,ZESTRIL) 20 MG tablet TAKE 1 TABLET (20 MG TOTAL) BY MOUTH DAILY. 90 tablet 3  . Magnesium Oxide (MAG-OXIDE PO) Take 1 tablet by mouth at bedtime.     . metoprolol tartrate (LOPRESSOR) 50 MG tablet TAKE 1/2 TABLET BY MOUTH EVERY EVENING. 45 tablet 3  . Multiple Vitamin (MULTIVITAMIN) tablet Take 1 tablet by mouth at bedtime.     .Marland KitchenMYRBETRIQ 50 MG TB24 tablet Take 1 tablet by mouth every morning.  3  . naphazoline-pheniramine (NAPHCON-A) 0.025-0.3 % ophthalmic solution Place 1 drop into both eyes daily as needed for irritation.    . nitroGLYCERIN (NITROSTAT) 0.4 MG SL tablet Place 1 tablet (0.4 mg total) under the tongue every 5 (five) minutes as needed for chest pain (for chest pain (MAX of 3  doses)). 25 tablet 6  . potassium chloride (K-DUR,KLOR-CON) 10 MEQ tablet Take 10 mEq by mouth 2 (two) times daily.      . rosuvastatin (CRESTOR) 40 MG tablet Take 1 tablet (40 mg total) by mouth daily. 90 tablet 3  . sildenafil (REVATIO) 20 MG tablet Take 2-3 tablets by mouth daily as needed 30 tablet 3  . Thiamine HCl (VITAMIN B-1) 100 MG tablet Take 100 mg by mouth daily.      .Alveda Reasons20 MG TABS tablet TAKE 1 TABLET BY MOUTH DAILY  WITH SUPPER 30 tablet 5   No current facility-administered medications on file prior to visit.     ROS ROS otherwise unremarkable unless listed above.  Physical Examination: BP 126/80   Pulse 79   Temp 99.3 F (37.4 C) (Oral)   Resp 17   Ht 5' 6"  (1.676 m)   Wt 160 lb (72.6 kg)   SpO2 98%   BMI 25.82 kg/m  Ideal Body Weight: Weight in (lb) to have BMI = 25: 154.6  Physical Exam  Constitutional: He is oriented to person, place, and time. He appears well-developed and well-nourished. No distress.  HENT:  Head: Normocephalic and atraumatic.  Eyes: Conjunctivae and EOM are normal. Pupils are equal, round, and reactive to light.  Cardiovascular: Normal rate. An irregularly irregular rhythm present. Exam reveals no friction rub.  No murmur heard. Pulmonary/Chest: Effort normal and breath sounds normal. No respiratory distress.  Abdominal: Soft. Normal appearance and bowel sounds are normal. There is no tenderness. There is no CVA tenderness.  Neurological: He is alert and oriented to person, place, and time.  Skin: Skin is warm and dry. He is not diaphoretic.  Psychiatric: He has a normal mood and affect. His behavior is normal.   Results for orders placed or performed in visit on 03/13/17  POC Influenza A&B(BINAX/QUICKVUE)  Result Value Ref Range   Influenza A, POC Negative Negative   Influenza B, POC Negative Negative  POCT urinalysis dipstick  Result Value Ref Range   Color, UA yellow yellow   Clarity, UA cloudy (A) clear   Glucose, UA =100  (A) negative mg/dL   Bilirubin, UA negative negative   Ketones, POC UA negative negative mg/dL   Spec Grav, UA 1.025 1.010 - 1.025   Blood, UA negative negative   pH, UA 5.5 5.0 - 8.0   Protein Ur, POC =100 (A) negative mg/dL   Urobilinogen, UA 0.2 0.2 or 1.0 E.U./dL   Nitrite, UA Negative Negative   Leukocytes, UA Trace (A) Negative  POCT CBC  Result Value Ref Range   WBC 17.1 (A) 4.6 - 10.2 K/uL   Lymph, poc 0.6 0.6 - 3.4   POC LYMPH PERCENT 3.3 (A) 10 - 50 %L   MID (cbc) 0.3 0 - 0.9   POC MID % 1.6 0 - 12 %M   POC Granulocyte 16.3 (A) 2 - 6.9   Granulocyte percent 95.1 (A) 37 - 80 %G   RBC 4.05 (A) 4.69 - 6.13 M/uL   Hemoglobin 13.0 (A) 14.1 - 18.1 g/dL   HCT, POC 38.6 (A) 43.5 - 53.7 %   MCV 95.2 80 - 97 fL   MCH, POC 32.1 (A) 27 - 31.2 pg   MCHC 33.7 31.8 - 35.4 g/dL   RDW, POC 13.2 %   Platelet Count, POC 109 (A) 142 - 424 K/uL   MPV 8.6 0 - 99.8 fL    Dg Chest 2 View  Result Date: 03/13/2017 CLINICAL DATA:  Fever, headache, leukocytosis, body aches, history coronary artery disease, hypertension EXAM: CHEST  2 VIEW COMPARISON:  03/24/2014 FINDINGS: LEFT subclavian AICD leads project at RIGHT atrium and RIGHT ventricle unchanged. Upper normal size of cardiac silhouette post TAVR and CABG. Mediastinal contours and pulmonary vascularity normal. Atherosclerotic calcification aorta. Coronary stents noted. Lungs clear. No infiltrate, pleural effusion or pneumothorax. IMPRESSION: Post CABG, TAVR, and AICD. No acute abnormalities. Electronically Signed   By: Lavonia Dana M.D.   On: 03/13/2017 18:04   In/out catheter performed to obtain  urine.  Notes that the underwear pad was soiled with urine, reported by nurse.  Assessment and Plan: [name] is a 83 y.o. male who is here today for cc of  Chief Complaint  Patient presents with  . Fever  . Generalized Body Aches  Initially, as patient was not able to give urine specimen with sign of possible bacteremia, discussed going to  the ED.  The patient and wife were adamant about that visit.  Declined transport.  They wished to pursue cath here. given rocephin injection 1 g tonight.  We will treat for possible prostatitis/uti.  Started on ciprofloxacin.  Given 10 days while we await culture.   Discussed at length precautions and if he is not able to self-void within the next 12 hours. Advised of alarming symptoms to warrant immediate ED visit. They voiced understanding.   Advised hydration.   Rtc tomorrow for recheck with mani.   We can await urine culture, but revisiting with urology may need to be executed soon.  Consider another rocephin injection This visit and history was reviewed with Dr. Mitchel Honour.  Fever, unspecified fever cause - Plan: POC Influenza A&B(BINAX/QUICKVUE), POCT urinalysis dipstick, POCT CBC, DG Chest 2 View, Urine Culture, cefTRIAXone (ROCEPHIN) injection 1 g, CMP14+EGFR, ciprofloxacin (CIPRO) 500 MG tablet  Body aches - Plan: POC Influenza A&B(BINAX/QUICKVUE), POCT urinalysis dipstick, POCT CBC, DG Chest 2 View, Urine Culture, cefTRIAXone (ROCEPHIN) injection 1 g, CMP14+EGFR, ciprofloxacin (CIPRO) 500 MG tablet  Leukocytes in urine - Plan: cefTRIAXone (ROCEPHIN) injection 1 g, CMP14+EGFR, ciprofloxacin (CIPRO) 500 MG tablet  Ivar Drape, PA-C [facility] 12/5/20187:08 PM  Addendum: inquired to see if they could add a psa, but may need to obtain at visit.

## 2017-03-14 ENCOUNTER — Encounter: Payer: Self-pay | Admitting: Urgent Care

## 2017-03-14 ENCOUNTER — Other Ambulatory Visit: Payer: Self-pay

## 2017-03-14 ENCOUNTER — Ambulatory Visit (INDEPENDENT_AMBULATORY_CARE_PROVIDER_SITE_OTHER): Payer: Medicare Other | Admitting: Urgent Care

## 2017-03-14 VITALS — BP 118/56 | HR 74 | Temp 98.3°F | Resp 16 | Ht 65.25 in | Wt 162.0 lb

## 2017-03-14 DIAGNOSIS — I255 Ischemic cardiomyopathy: Secondary | ICD-10-CM

## 2017-03-14 DIAGNOSIS — D72829 Elevated white blood cell count, unspecified: Secondary | ICD-10-CM | POA: Diagnosis not present

## 2017-03-14 DIAGNOSIS — R509 Fever, unspecified: Secondary | ICD-10-CM | POA: Diagnosis not present

## 2017-03-14 DIAGNOSIS — N410 Acute prostatitis: Secondary | ICD-10-CM | POA: Diagnosis not present

## 2017-03-14 DIAGNOSIS — R82998 Other abnormal findings in urine: Secondary | ICD-10-CM | POA: Diagnosis not present

## 2017-03-14 LAB — CMP14+EGFR
A/G RATIO: 1.5 (ref 1.2–2.2)
ALBUMIN: 4 g/dL (ref 3.5–4.7)
ALK PHOS: 70 IU/L (ref 39–117)
ALT: 12 IU/L (ref 0–44)
AST: 14 IU/L (ref 0–40)
BUN / CREAT RATIO: 20 (ref 10–24)
BUN: 24 mg/dL (ref 8–27)
Bilirubin Total: 0.6 mg/dL (ref 0.0–1.2)
CO2: 21 mmol/L (ref 20–29)
CREATININE: 1.21 mg/dL (ref 0.76–1.27)
Calcium: 10.1 mg/dL (ref 8.6–10.2)
Chloride: 104 mmol/L (ref 96–106)
GFR calc Af Amer: 65 mL/min/{1.73_m2} (ref >59)
GFR calc non Af Amer: 56 mL/min/{1.73_m2} — ABNORMAL LOW (ref >59)
GLOBULIN, TOTAL: 2.7 g/dL (ref 1.5–4.5)
Glucose: 114 mg/dL — ABNORMAL HIGH (ref 65–99)
POTASSIUM: 4 mmol/L (ref 3.5–5.2)
SODIUM: 138 mmol/L (ref 134–144)
Total Protein: 6.7 g/dL (ref 6.0–8.5)

## 2017-03-14 LAB — POCT URINALYSIS DIP (MANUAL ENTRY)
BILIRUBIN UA: NEGATIVE mg/dL
Bilirubin, UA: NEGATIVE
GLUCOSE UA: NEGATIVE mg/dL
Leukocytes, UA: NEGATIVE
Nitrite, UA: NEGATIVE
PROTEIN UA: NEGATIVE mg/dL
SPEC GRAV UA: 1.015 (ref 1.010–1.025)
UROBILINOGEN UA: 0.2 U/dL
pH, UA: 5.5 (ref 5.0–8.0)

## 2017-03-14 NOTE — Patient Instructions (Addendum)
Prostatitis Prostatitis is swelling or inflammation of the prostate gland. The prostate is a walnut-sized gland that is involved in the production of semen. It is located below a man's bladder, in front of the rectum. There are four types of prostatitis:  Chronic nonbacterial prostatitis. This is the most common type of prostatitis. It may be associated with a viral infection or autoimmune disorder.  Acute bacterial prostatitis. This is the least common type of prostatitis. It starts quickly and is usually associated with a bladder infection, high fever, and shaking chills. It can occur at any age.  Chronic bacterial prostatitis. This type usually results from acute bacterial prostatitis that happens repeatedly (is recurrent) or has not been treated properly. It can occur in men of any age but is most common among middle-aged men whose prostate has begun to get larger. The symptoms are not as severe as symptoms caused by acute bacterial prostatitis.  Prostatodynia or chronic pelvic pain syndrome (CPPS). This type is also called pelvic floor disorder. It is associated with increased muscular tone in the pelvis surrounding the prostate.  What are the causes? Bacterial prostatitis is caused by infection from bacteria. Chronic nonbacterial prostatitis may be caused by:  Urinary tract infections (UTIs).  Nerve damage.  A response by the body's disease-fighting system (autoimmune response).  Chemicals in the urine.  The causes of the other types of prostatitis are usually not known. What are the signs or symptoms? Symptoms of this condition vary depending upon the type of prostatitis. If you have acute bacterial prostatitis, you may experience:  Urinary symptoms, such as: ? Painful urination. ? Burning during urination. ? Frequent and sudden urges to urinate. ? Inability to start urinating. ? A weak or interrupted stream of urine.  Vomiting.  Nausea.  Fever.  Chills.  Inability to  empty the bladder completely.  Pain in the: ? Muscles or joints. ? Lower back. ? Lower abdomen.  If you have any of the other types of prostatitis, you may experience:  Urinary symptoms, such as: ? Sudden urges to urinate. ? Frequent urination. ? Difficulty starting urination. ? Weak urine stream. ? Dribbling after urination.  Discharge from the urethra. The urethra is a tube that opens at the end of the penis.  Pain in the: ? Testicles. ? Penis or tip of the penis. ? Rectum. ? Area in front of the rectum and below the scrotum (perineum).  Problems with sexual function.  Painful ejaculation.  Bloody semen.  How is this diagnosed? This condition may be diagnosed based on:  A physical and medical exam.  Your symptoms.  A urine test to check for bacteria.  An exam in which a health care provider uses a finger to feel the prostate (digital rectal exam).  A test of a sample of semen.  Blood tests.  Ultrasound.  Removal of prostate tissue to be examined under a microscope (biopsy).  Tests to check how your body handles urine (urodynamic tests).  A test to look inside your bladder or urethra (cystoscopy).  How is this treated? Treatment for this condition depends on the type of prostatitis. Treatment may involve:  Medicines to relieve pain or inflammation.  Medicines to help relax your muscles.  Physical therapy.  Heat therapy.  Techniques to help you control certain body functions (biofeedback).  Relaxation exercises.  Antibiotic medicine, if your condition is caused by bacteria.  Warm water baths (sitz baths). Sitz baths help with relaxing your pelvic floor muscles, which helps to relieve  pressure on the prostate.  Follow these instructions at home:  Take over-the-counter and prescription medicines only as told by your health care provider.  If you were prescribed an antibiotic, take it as told by your health care provider. Do not stop taking the  antibiotic even if you start to feel better.  If physical therapy, biofeedback, or relaxation exercises were prescribed, do exercises as instructed.  Take sitz baths as directed by your health care provider. For a sitz bath, sit in warm water that is deep enough to cover your hips and buttocks.  Keep all follow-up visits as told by your health care provider. This is important. Contact a health care provider if:  Your symptoms get worse.  You have a fever. Get help right away if:  You have chills.  You feel nauseous.  You vomit.  You feel light-headed or feel like you are going to faint.  You are unable to urinate.  You have blood or blood clots in your urine. This information is not intended to replace advice given to you by your health care provider. Make sure you discuss any questions you have with your health care provider. Document Released: 03/23/2000 Document Revised: 12/15/2015 Document Reviewed: 12/15/2015 Elsevier Interactive Patient Education  2017 [place].    Ciprofloxacin tablets What is this medicine? CIPROFLOXACIN (sip roe FLOX a sin) is a quinolone antibiotic. It is used to treat certain kinds of bacterial infections. It will not work for colds, flu, or other viral infections. This medicine may be used for other purposes; ask your health care provider or pharmacist if you have questions. COMMON BRAND NAME(S): Cipro What should I tell my health care provider before I take this medicine? They need to know if you have any of these conditions: -bone problems -history of low levels of potassium in the blood -joint problems -irregular heartbeat -kidney disease -myasthenia gravis -seizures -tendon problems -tingling of the fingers or toes, or other nerve disorder -an unusual or allergic reaction to ciprofloxacin, other antibiotics or medicines, foods, dyes, or preservatives -pregnant or trying to get pregnant -breast-feeding How should I use this  medicine? Take this medicine by mouth with a glass of water. Follow the directions on the prescription label. Take your medicine at regular intervals. Do not take your medicine more often than directed. Take all of your medicine as directed even if you think your are better. Do not skip doses or stop your medicine early. You can take this medicine with food or on an empty stomach. It can be taken with a meal that contains dairy or calcium, but do not take it alone with a dairy product, like milk or yogurt or calcium-fortified juice. A special MedGuide will be given to you by the pharmacist with each prescription and refill. Be sure to read this information carefully each time. Talk to your pediatrician regarding the use of this medicine in children. Special care may be needed. Overdosage: If you think you have taken too much of this medicine contact a poison control center or emergency room at once. NOTE: This medicine is only for you. Do not share this medicine with others. What if I miss a dose? If you miss a dose, take it as soon as you can. If it is almost time for your next dose, take only that dose. Do not take double or extra doses. What may interact with this medicine? Do not take this medicine with any of the following medications: -cisapride -dofetilide -dronedarone -flibanserin -lomitapide -  pimozide -thioridazine -tizanidine -ziprasidone This medicine may also interact with the following medications: -antacids -birth control pills -caffeine -certain medicines for diabetes, like glipizide or glyburide -certain medicines that treat or prevent blood clots like warfarin -clozapine -cyclosporine -didanosine (ddI) buffered tablets or powder -duloxetine -lanthanum carbonate -lidocaine -methotrexate -multivitamins -NSAIDS, medicines for pain and inflammation, like ibuprofen or naproxen -olanzapine -omeprazole -other medicines that prolong the QT interval (cause an abnormal  heart rhythm) -phenytoin -probenecid -ropinirole -sevelamer -sildenafil -sucralfate -theophylline -zolpidem This list may not describe all possible interactions. Give your health care provider a list of all the medicines, herbs, non-prescription drugs, or dietary supplements you use. Also tell them if you smoke, drink alcohol, or use illegal drugs. Some items may interact with your medicine. What should I watch for while using this medicine? Tell your doctor or health care professional if your symptoms do not improve. Do not treat diarrhea with over the counter products. Contact your doctor if you have diarrhea that lasts more than 2 days or if it is severe and watery. You may get drowsy or dizzy. Do not drive, use machinery, or do anything that needs mental alertness until you know how this medicine affects you. Do not stand or sit up quickly, especially if you are an older patient. This reduces the risk of dizzy or fainting spells. This medicine can make you more sensitive to the sun. Keep out of the sun. If you cannot avoid being in the sun, wear protective clothing and use sunscreen. Do not use sun lamps or tanning beds/booths. Avoid antacids, aluminum, calcium, iron, magnesium, and zinc products for 6 hours before and 2 hours after taking a dose of this medicine. What side effects may I notice from receiving this medicine? Side effects that you should report to your doctor or health care professional as soon as possible: -allergic reactions like skin rash or hives, swelling of the face, lips, or tongue -anxious -confusion -depressed mood -diarrhea -fast, irregular heartbeat -hallucination, loss of contact with reality -joint, muscle, or tendon pain or swelling -pain, tingling, numbness in the hands or feet -suicidal thoughts or other mood changes -sunburn -unusually weak or tired Side effects that usually do not require medical attention (report to your doctor or health care  professional if they continue or are bothersome): -dry mouth -headache -nausea -trouble sleeping This list may not describe all possible side effects. Call your doctor for medical advice about side effects. You may report side effects to FDA at 1-800-FDA-1088. Where should I keep my medicine? Keep out of the reach of children. Store at room temperature below 30 degrees C (86 degrees F). Keep container tightly closed. Throw away any unused medicine after the expiration date. NOTE: This sheet is a summary. It may not cover all possible information. If you have questions about this medicine, talk to your doctor, pharmacist, or health care provider.  2018 Elsevier/Gold Standard (2015-11-04 14:42:02)     IF you received an x-ray today, you will receive an invoice from [facility]. Please contact [facility] at [phone] with questions or concerns regarding your invoice.   IF you received labwork today, you will receive an invoice from [facility]. Please contact [facility] at [phone] with questions or concerns regarding your invoice.   Our billing staff will not be able to assist you with questions regarding bills from these companies.  You will be contacted with the lab results as soon as they are available. The fastest way to get your results is to activate  your My Chart account. Instructions are located on the last page of this paperwork. If you have not heard from [place] regarding the results in 2 weeks, please contact this office.

## 2017-03-14 NOTE — Progress Notes (Signed)
   MRN: [id number] DOB: 29-Jun-1933  Subjective:   [name] is a 83 y.o. male presenting for follow up on prostatitis. Patient was last seen 03/13/2017, treated with ceftriaxone injection, ciprofloxacin orally. Patient has not started ciprofloxacin. Wife is refusing blood work for patient. He reports significant improvement. Denies fever, malaise is improved, aching is improved. Denies n/v, dysuria. He is hydrating aggressively.   [name] has a current medication list which includes the following prescription(s): acetaminophen, amlodipine, amoxicillin, calcium carbonate, ciprofloxacin, coenzyme q10, diazepam, furosemide, krill oil, latanoprost, lisinopril, magnesium oxide, metoprolol tartrate, multivitamin, myrbetriq, naphazoline-pheniramine, nitroglycerin, potassium chloride, rosuvastatin, sildenafil, thiamine, and xarelto. Also is allergic to cyclobenzaprine hcl and tetracycline.  [name]  has a past medical history of Aortic stenosis, severe, Arthritis, Cardiac arrest ([place]), Claudication [facility]), Coronary artery disease, Essential hypertension, Headache, History of hiatal hernia, Hyperlipidemia, Kidney stones, and Peripheral vascular disease ([place]). Also  has a past surgical history that includes Coronary artery bypass graft; Appendectomy; Adenoidectomy; cataract surgery; left elbow tendon surgery; Cardiac catheterization ( 11/16/2009 ); left and right heart catheterization with coronary/graft angiogram (N/A, 02/01/2014); Tonsillectomy; Hernia repair; Cardiac defibrillator placement; Transcatheter aortic valve replacement, transfemoral (N/A, 03/23/2014); TEE without cardioversion (N/A, 03/23/2014); and Cardiac catheterization (N/A, 03/21/2016).  Objective:   Vitals: BP (!) 118/56 (BP Location: Left Arm, Patient Position: Sitting, Cuff Size: Large)   Pulse 74   Temp 98.3 F (36.8 C) (Oral)   Resp 16   Ht 5' 5.25" (1.657 m)   Wt 162 lb (73.5 kg)   SpO2 95%   BMI 26.75 kg/m   Physical Exam    Constitutional: He is oriented to person, place, and time. He appears well-developed and well-nourished.  Cardiovascular: Normal rate.  Pulmonary/Chest: Effort normal.  Neurological: He is alert and oriented to person, place, and time.  Psychiatric: He has a normal mood and affect.   Results for orders placed or performed in visit on 03/14/17 (from the past 24 hour(s))  POCT urinalysis dipstick     Status: Abnormal   Collection Time: 03/14/17  2:22 PM  Result Value Ref Range   Color, UA yellow yellow   Clarity, UA clear clear   Glucose, UA negative negative mg/dL   Bilirubin, UA negative negative   Ketones, POC UA negative negative mg/dL   Spec Grav, UA 1.015 1.010 - 1.025   Blood, UA trace-lysed (A) negative   pH, UA 5.5 5.0 - 8.0   Protein Ur, POC negative negative mg/dL   Urobilinogen, UA 0.2 0.2 or 1.0 E.U./dL   Nitrite, UA Negative Negative   Leukocytes, UA Negative Negative   Assessment and Plan :   1. Acute prostatitis 2. Leukocytosis, unspecified type 3. Leukocytes in urine 4. Fever, unspecified fever cause - Reviewed labs with patient. Discussed worsening symptoms warranting ER visit. Patient's wife refused blood work for patient. I explained that it would be important to recheck his wbc but she is adamant that he had blood work done yesterday and will be seeing his cardiologist soon for more blood work. Return-to-clinic precautions discussed, patient verbalized understanding. Urine culture is still pending. I recommended they call urologist to set up an office visit and they can cancel if his symptoms fully resolve.   Jaynee Eagles, PA-C [facility] [phone] 03/14/2017 2:07 PM

## 2017-03-15 LAB — URINE CULTURE

## 2017-03-19 ENCOUNTER — Other Ambulatory Visit: Payer: Medicare Other

## 2017-03-19 DIAGNOSIS — Z87438 Personal history of other diseases of male genital organs: Secondary | ICD-10-CM | POA: Diagnosis not present

## 2017-03-19 DIAGNOSIS — D72829 Elevated white blood cell count, unspecified: Secondary | ICD-10-CM | POA: Diagnosis not present

## 2017-03-19 DIAGNOSIS — N410 Acute prostatitis: Secondary | ICD-10-CM | POA: Diagnosis not present

## 2017-03-19 DIAGNOSIS — R82998 Other abnormal findings in urine: Secondary | ICD-10-CM | POA: Diagnosis not present

## 2017-03-19 NOTE — Addendum Note (Signed)
Addended by: Gari Crown D on: 03/19/2017 12:40 PM   Modules accepted: Orders

## 2017-03-20 LAB — PSA: Prostate Specific Ag, Serum: 18.5 ng/mL — ABNORMAL HIGH (ref 0.0–4.0)

## 2017-03-21 ENCOUNTER — Encounter: Payer: Medicare Other | Admitting: *Deleted

## 2017-03-21 ENCOUNTER — Other Ambulatory Visit: Payer: Medicare Other | Admitting: *Deleted

## 2017-03-21 ENCOUNTER — Telehealth: Payer: Self-pay | Admitting: Cardiology

## 2017-03-21 DIAGNOSIS — I2510 Atherosclerotic heart disease of native coronary artery without angina pectoris: Secondary | ICD-10-CM

## 2017-03-21 DIAGNOSIS — E7849 Other hyperlipidemia: Secondary | ICD-10-CM | POA: Diagnosis not present

## 2017-03-21 LAB — LIPID PANEL
Chol/HDL Ratio: 3.4 ratio (ref 0.0–5.0)
Cholesterol, Total: 125 mg/dL (ref 100–199)
HDL: 37 mg/dL — AB (ref >39)
LDL CALC: 68 mg/dL (ref 0–99)
Triglycerides: 99 mg/dL (ref 0–149)
VLDL CHOLESTEROL CAL: 20 mg/dL (ref 5–40)

## 2017-03-21 LAB — HEPATIC FUNCTION PANEL
ALK PHOS: 66 IU/L (ref 39–117)
ALT: 14 IU/L (ref 0–44)
AST: 21 IU/L (ref 0–40)
Albumin: 4 g/dL (ref 3.5–4.7)
BILIRUBIN, DIRECT: 0.15 mg/dL (ref 0.00–0.40)
Bilirubin Total: 0.4 mg/dL (ref 0.0–1.2)
Total Protein: 6.8 g/dL (ref 6.0–8.5)

## 2017-03-21 NOTE — Telephone Encounter (Signed)
Spoke with pt and reminded pt of remote transmission that is due today. Pt verbalized understanding.   

## 2017-03-22 ENCOUNTER — Encounter: Payer: Self-pay | Admitting: Cardiology

## 2017-03-28 ENCOUNTER — Other Ambulatory Visit: Payer: Self-pay | Admitting: Internal Medicine

## 2017-03-28 ENCOUNTER — Other Ambulatory Visit: Payer: Self-pay | Admitting: Physician Assistant

## 2017-03-28 DIAGNOSIS — R82998 Other abnormal findings in urine: Secondary | ICD-10-CM

## 2017-03-28 DIAGNOSIS — N410 Acute prostatitis: Secondary | ICD-10-CM

## 2017-03-28 DIAGNOSIS — R52 Pain, unspecified: Secondary | ICD-10-CM

## 2017-03-28 DIAGNOSIS — R509 Fever, unspecified: Secondary | ICD-10-CM

## 2017-03-28 NOTE — Telephone Encounter (Signed)
See other telephone encounter for today.  This has been handled.

## 2017-03-28 NOTE — Telephone Encounter (Signed)
Copied from [place]. Topic: Quick Communication - See Telephone Encounter >> Mar 28, 2017 12:24 PM Corie Chiquito, [place] wrote: CRM for notification. See Telephone encounter for: Patients wife called because her husband still needs his new perscription on his Ciprofloxacin. She has been in touch with the pharmacy as well. If someone could give them a call back at [phone]  03/28/17.

## 2017-03-28 NOTE — Telephone Encounter (Signed)
Antibiotic refill request

## 2017-04-15 DIAGNOSIS — N401 Enlarged prostate with lower urinary tract symptoms: Secondary | ICD-10-CM | POA: Diagnosis not present

## 2017-04-15 DIAGNOSIS — N3941 Urge incontinence: Secondary | ICD-10-CM | POA: Diagnosis not present

## 2017-04-15 DIAGNOSIS — N5201 Erectile dysfunction due to arterial insufficiency: Secondary | ICD-10-CM | POA: Diagnosis not present

## 2017-04-15 DIAGNOSIS — N410 Acute prostatitis: Secondary | ICD-10-CM | POA: Diagnosis not present

## 2017-04-15 DIAGNOSIS — R972 Elevated prostate specific antigen [PSA]: Secondary | ICD-10-CM | POA: Diagnosis not present

## 2017-04-17 NOTE — Progress Notes (Signed)
Subjective:   [name] is a 84 y.o. male who presents for Medicare Annual/Subsequent preventive examination.  Review of Systems:  No ROS.  Medicare Wellness Visit. Additional risk factors are reflected in the social history.  Cardiac Risk Factors include: advanced age (>89men, >59 women);hypertension;male gender Sleep patterns: feels rested on waking, gets up 1-2 times nightly to void and sleeps 7-8 hours nightly.    Home Safety/Smoke Alarms: Feels safe in home. Smoke alarms in place.  Living environment; residence and Firearm Safety: 2-story house, no firearms.Lives with husband, no needs for DME, good support system Seat Belt Safety/Bike Helmet: Wears seat belt.     Objective:    Vitals: BP 124/62   Pulse 60   Resp 20   Ht 5\' 5"  (1.651 m)   Wt 155 lb (70.3 kg)   SpO2 97%   BMI 25.79 kg/m   Body mass index is 25.79 kg/m.  Advanced Directives 04/18/2017 04/17/2016 03/21/2016 12/01/2015 03/25/2014 03/19/2014 03/12/2014  Does Patient Have a Medical Advance Directive? Yes Yes No No Yes - Yes  Type of Paramedic of [place];Living will Living will;Healthcare Power of [place];Living will - [place];Living will  Does patient want to make changes to medical advance directive? - - - - No - Patient declined No - Patient declined No - Patient declined  Copy of [place] in Chart? No - copy requested No - copy requested - - No - copy requested No - copy requested No - copy requested  Would patient like information on creating a medical advance directive? - - No - Patient declined No - patient declined information - - -    Tobacco Social History   Tobacco Use  Smoking Status Former Smoker  . Last attempt to quit: 10/25/1952  . Years since quitting: 64.5  Smokeless Tobacco Never Used     Counseling given: Not Answered   Past Medical History:  Diagnosis Date  . Aortic stenosis,  severe    s/p TAVR with Edwards Sapien 3 THV (size 26 mm, model # 9600TFX, serial # [id number])  . Arthritis   . Cardiac arrest ([place])   . Claudication ([place])   . Coronary artery disease   . Essential hypertension   . Headache   . History of hiatal hernia   . Hyperlipidemia   . Kidney stones   . Peripheral vascular disease [facility])    Past Surgical History:  Procedure Laterality Date  . ADENOIDECTOMY    . APPENDECTOMY    . CARDIAC CATHETERIZATION   11/16/2009   . CARDIAC DEFIBRILLATOR PLACEMENT    . cataract surgery      Bilateral cataract surgery  . CORONARY ARTERY BYPASS GRAFT     in 1982 and '84 with subsequent PTCI  . EP IMPLANTABLE DEVICE N/A 03/21/2016   Procedure: ICD Generator Changeout;  Surgeon: Deboraha Sprang, MD;  Location: [place] [facility];  Service: Cardiovascular;  Laterality: N/A;  . HERNIA REPAIR    . LEFT AND RIGHT HEART CATHETERIZATION WITH CORONARY/GRAFT ANGIOGRAM N/A 02/01/2014   Procedure: LEFT AND RIGHT HEART CATHETERIZATION WITH Beatrix Fetters;  Surgeon: Sinclair Grooms, MD;  Location: [facility] CATH LAB;  Service: Cardiovascular;  Laterality: N/A;  . left elbow tendon surgery    . TEE WITHOUT CARDIOVERSION N/A 03/23/2014   Procedure: TRANSESOPHAGEAL ECHOCARDIOGRAM (TEE);  Surgeon: Burnell Blanks, MD;  Location: [place];  Service: Open Heart Surgery;  Laterality: N/A;  . TONSILLECTOMY    . TRANSCATHETER AORTIC VALVE REPLACEMENT, TRANSFEMORAL N/A 03/23/2014   Procedure: TRANSCATHETER AORTIC VALVE REPLACEMENT, TRANSFEMORAL;  Surgeon: Burnell Blanks, MD;  Location: [place];  Service: Open Heart Surgery;  Laterality: N/A;   Family History  Problem Relation Age of Onset  . Heart attack Father 62  . Coronary artery disease Other        family history is dignificant for early CAD in several members   Social History   Socioeconomic History  . Marital status: Married    Spouse name: None  . Number of children: 3  . Years of education: None  .  Highest education level: None  Social Needs  . Financial resource strain: Not hard at all  . Food insecurity - worry: Never true  . Food insecurity - inability: Never true  . Transportation needs - medical: No  . Transportation needs - non-medical: No  Occupational History  . Occupation: Retired  Tobacco Use  . Smoking status: Former Smoker    Last attempt to quit: 10/25/1952    Years since quitting: 64.5  . Smokeless tobacco: Never Used  Substance and Sexual Activity  . Alcohol use: Yes    Alcohol/week: 3.0 - 3.6 oz    Types: 5 - 6 Glasses of wine per week    Comment: 5-6 times a week  . Drug use: No  . Sexual activity: No  Other Topics Concern  . None  Social History Narrative  . None    Outpatient Encounter Medications as of 04/18/2017  Medication Sig  . acetaminophen (TYLENOL) 325 MG tablet Take 650 mg by mouth daily as needed for moderate pain or headache.  Marland Kitchen amLODipine (NORVASC) 5 MG tablet TAKE 1 TABLET BY MOUTH DAILY.  Marland Kitchen amoxicillin (AMOXIL) 500 MG tablet Take 4 tablets by mouth 60 minutes prior to dental appointments  . calcium carbonate (TUMS - DOSED IN MG ELEMENTAL CALCIUM) 500 MG chewable tablet Chew 2 tablets by mouth daily as needed for indigestion or heartburn.  . Coenzyme Q10 (COQ10 PO) Take 1 Dose by mouth at bedtime. Take 150mg  by mouth every night (liquid)  . diazepam (VALIUM) 5 MG tablet TAKE 1 TABLET BY MOUTH DAILY AS NEEDED  . furosemide (LASIX) 40 MG tablet TAKE 1 TABLET BY MOUTH DAILY.  Marland Kitchen KRILL OIL PO Take 1 tablet by mouth daily.  Marland Kitchen latanoprost (XALATAN) 0.005 % ophthalmic solution Place 1 drop into both eyes at bedtime.   Marland Kitchen lisinopril (PRINIVIL,ZESTRIL) 20 MG tablet TAKE 1 TABLET (20 MG TOTAL) BY MOUTH DAILY.  . Magnesium Oxide (MAG-OXIDE PO) Take 1 tablet by mouth at bedtime.   . metoprolol tartrate (LOPRESSOR) 50 MG tablet TAKE 1/2 TABLET BY MOUTH EVERY EVENING.  . Multiple Vitamin (MULTIVITAMIN) tablet Take 1 tablet by mouth at bedtime.   .  naphazoline-pheniramine (NAPHCON-A) 0.025-0.3 % ophthalmic solution Place 1 drop into both eyes daily as needed for irritation.  . nitroGLYCERIN (NITROSTAT) 0.4 MG SL tablet Place 1 tablet (0.4 mg total) under the tongue every 5 (five) minutes as needed for chest pain (for chest pain (MAX of 3 doses)).  Marland Kitchen potassium chloride (K-DUR,KLOR-CON) 10 MEQ tablet Take 10 mEq by mouth 2 (two) times daily.    . Thiamine HCl (VITAMIN B-1) 100 MG tablet Take 100 mg by mouth daily.    Alveda Reasons 20 MG TABS tablet TAKE 1 TABLET BY MOUTH DAILY WITH SUPPER  . rosuvastatin (CRESTOR) 40 MG tablet Take 1 tablet (40  mg total) by mouth daily.  . [DISCONTINUED] ciprofloxacin (CIPRO) 500 MG tablet TAKE 1 TABLET BY MOUTH 2 TIMES DAILY. (Patient not taking: Reported on 04/18/2017)  . [DISCONTINUED] MYRBETRIQ 50 MG TB24 tablet Take 1 tablet by mouth every morning.  . [DISCONTINUED] sildenafil (REVATIO) 20 MG tablet Take 2-3 tablets by mouth daily as needed (Patient not taking: Reported on 03/14/2017)   No facility-administered encounter medications on file as of 04/18/2017.     Activities of Daily Living In your present state of health, do you have any difficulty performing the following activities: 04/18/2017 03/13/2017  Hearing? Y N  Vision? N N  Difficulty concentrating or making decisions? N N  Walking or climbing stairs? N N  Dressing or bathing? N N  Doing errands, shopping? N N  Preparing Food and eating ? N -  Using the Toilet? N -  In the past six months, have you accidently leaked urine? Y -  Comment Sees Dr. Jeffie Pollock -  Do you have problems with loss of bowel control? N -  Managing your Medications? N -  Managing your Finances? N -  Housekeeping or managing your Housekeeping? N -  Some recent data might be hidden    Patient Care Team: Binnie Rail, MD as PCP - General (Internal Medicine) Burnell Blanks, MD as Consulting Physician (Cardiology) Deboraha Sprang, MD as Consulting Physician  (Cardiology) Irine Seal, MD as Attending Physician (Urology) Marcene Brawn [facility])   Assessment:   This is a routine wellness examination for Leo. Physical assessment deferred to PCP.   Exercise Activities and Dietary recommendations Current Exercise Habits: Home exercise routine, Type of exercise: walking(plays golf), Time (Minutes): 30, Frequency (Times/Week): 3, Weekly Exercise (Minutes/Week): 90, Intensity: Mild, Exercise limited by: cardiac condition(s) Diet (meal preparation, eat out, water intake, caffeinated beverages, dairy products, fruits and vegetables): in general, a "healthy" diet  , well balanced   Reviewed heart healthy diet, encouraged patient to increase daily water intake.  Goals    . Patient Stated     Stay as active and independent as possible. Continue to do yard work and play golf as often as I can.       Fall Risk Fall Risk  04/18/2017 03/14/2017 03/13/2017 04/17/2016 12/09/2015  Falls in the past year? No No No Yes No  Comment - - - - Emmi Telephone Survey: data to providers prior to load  Number falls in past yr: - - - 1 -  Injury with Fall? - - - No -  Follow up - - - Falls prevention discussed -    Depression Screen PHQ 2/9 Scores 04/18/2017 03/14/2017 03/13/2017 04/17/2016  PHQ - 2 Score 0 0 0 0  PHQ- 9 Score 0 - - -    Cognitive Function MMSE - Mini Mental State Exam 04/18/2017 04/17/2016  Orientation to time 5 4  Orientation to Place 5 5  Registration 3 3  Attention/ Calculation 5 5  Recall 2 2  Language- name 2 objects 2 2  Language- repeat 1 1  Language- follow 3 step command 3 3  Language- read & follow direction 1 1  Write a sentence 1 1  Copy design 1 1  Total score 29 28        Immunization History  Administered Date(s) Administered  . Influenza, High Dose Seasonal PF 01/25/2017  . Influenza-Unspecified 01/15/2014, 01/16/2016  . Pneumococcal Conjugate-13 01/13/2014  . Pneumococcal Polysaccharide-23 01/08/2015    Screening  Tests Health Maintenance  Topic  Date Due  . TETANUS/TDAP  02/15/1953  . INFLUENZA VACCINE  Completed  . PNA vac Low Risk Adult  Completed      Plan:    Continue doing brain stimulating activities (puzzles, reading, adult coloring books, staying active) to keep memory sharp.   Continue to eat heart healthy diet (full of fruits, vegetables, whole grains, lean protein, water--limit salt, fat, and sugar intake) and increase physical activity as tolerated.  I have personally reviewed and noted the following in the patient's chart:   . Medical and social history . Use of alcohol, tobacco or illicit drugs  . Current medications and supplements . Functional ability and status . Nutritional status . Physical activity . Advanced directives . List of other physicians . Vitals . Screenings to include cognitive, depression, and falls . Referrals and appointments  In addition, I have reviewed and discussed with patient certain preventive protocols, quality metrics, and best practice recommendations. A written personalized care plan for preventive services as well as general preventive health recommendations were provided to patient.     Michiel Cowboy, RN  04/18/2017

## 2017-04-18 ENCOUNTER — Ambulatory Visit (INDEPENDENT_AMBULATORY_CARE_PROVIDER_SITE_OTHER): Payer: Medicare Other | Admitting: *Deleted

## 2017-04-18 VITALS — BP 124/62 | HR 60 | Resp 20 | Ht 65.0 in | Wt 155.0 lb

## 2017-04-18 DIAGNOSIS — Z0000 Encounter for general adult medical examination without abnormal findings: Secondary | ICD-10-CM

## 2017-04-18 NOTE — Patient Instructions (Signed)
Continue doing brain stimulating activities (puzzles, reading, adult coloring books, staying active) to keep memory sharp.   Continue to eat heart healthy diet (full of fruits, vegetables, whole grains, lean protein, water--limit salt, fat, and sugar intake) and increase physical activity as tolerated.   [name] , Thank you for taking time to come for your Medicare Wellness Visit. I appreciate your ongoing commitment to your health goals. Please review the following plan we discussed and let me know if I can assist you in the future.   These are the goals we discussed: Goals    . Patient Stated     Stay as active and independent as possible. Continue to do yard work and play golf as often as I can.       This is a list of the screening recommended for you and due dates:  Health Maintenance  Topic Date Due  . Tetanus Vaccine  02/15/1953  . Flu Shot  Completed  . Pneumonia vaccines  Completed

## 2017-04-19 NOTE — Progress Notes (Signed)
Medical screening examination/treatment/procedure(s) were performed by non-physician practitioner and as supervising physician I was immediately available for consultation/collaboration. I agree with above. Elizabeth A Crawford, MD 

## 2017-04-22 DIAGNOSIS — N3941 Urge incontinence: Secondary | ICD-10-CM | POA: Diagnosis not present

## 2017-05-01 DIAGNOSIS — R310 Gross hematuria: Secondary | ICD-10-CM | POA: Diagnosis not present

## 2017-05-01 DIAGNOSIS — N410 Acute prostatitis: Secondary | ICD-10-CM | POA: Diagnosis not present

## 2017-05-06 DIAGNOSIS — L570 Actinic keratosis: Secondary | ICD-10-CM | POA: Diagnosis not present

## 2017-05-06 DIAGNOSIS — D225 Melanocytic nevi of trunk: Secondary | ICD-10-CM | POA: Diagnosis not present

## 2017-05-06 DIAGNOSIS — L821 Other seborrheic keratosis: Secondary | ICD-10-CM | POA: Diagnosis not present

## 2017-05-06 DIAGNOSIS — L300 Nummular dermatitis: Secondary | ICD-10-CM | POA: Diagnosis not present

## 2017-05-06 DIAGNOSIS — L918 Other hypertrophic disorders of the skin: Secondary | ICD-10-CM | POA: Diagnosis not present

## 2017-05-06 DIAGNOSIS — Z85828 Personal history of other malignant neoplasm of skin: Secondary | ICD-10-CM | POA: Diagnosis not present

## 2017-05-15 DIAGNOSIS — N3941 Urge incontinence: Secondary | ICD-10-CM | POA: Diagnosis not present

## 2017-05-15 DIAGNOSIS — N281 Cyst of kidney, acquired: Secondary | ICD-10-CM | POA: Diagnosis not present

## 2017-05-15 DIAGNOSIS — R972 Elevated prostate specific antigen [PSA]: Secondary | ICD-10-CM | POA: Diagnosis not present

## 2017-05-24 DIAGNOSIS — N3941 Urge incontinence: Secondary | ICD-10-CM | POA: Diagnosis not present

## 2017-05-24 DIAGNOSIS — M6281 Muscle weakness (generalized): Secondary | ICD-10-CM | POA: Diagnosis not present

## 2017-05-24 DIAGNOSIS — M6289 Other specified disorders of muscle: Secondary | ICD-10-CM | POA: Diagnosis not present

## 2017-05-24 DIAGNOSIS — N3942 Incontinence without sensory awareness: Secondary | ICD-10-CM | POA: Diagnosis not present

## 2017-06-03 DIAGNOSIS — M62838 Other muscle spasm: Secondary | ICD-10-CM | POA: Diagnosis not present

## 2017-06-03 DIAGNOSIS — M6281 Muscle weakness (generalized): Secondary | ICD-10-CM | POA: Diagnosis not present

## 2017-06-03 DIAGNOSIS — N3942 Incontinence without sensory awareness: Secondary | ICD-10-CM | POA: Diagnosis not present

## 2017-06-03 DIAGNOSIS — R3915 Urgency of urination: Secondary | ICD-10-CM | POA: Diagnosis not present

## 2017-06-03 DIAGNOSIS — N3941 Urge incontinence: Secondary | ICD-10-CM | POA: Diagnosis not present

## 2017-06-04 DIAGNOSIS — H6123 Impacted cerumen, bilateral: Secondary | ICD-10-CM | POA: Diagnosis not present

## 2017-06-13 ENCOUNTER — Telehealth: Payer: Self-pay | Admitting: *Deleted

## 2017-06-13 DIAGNOSIS — F419 Anxiety disorder, unspecified: Secondary | ICD-10-CM

## 2017-06-13 MED ORDER — DIAZEPAM 5 MG PO TABS: 5.0000 mg | ORAL_TABLET | Freq: Every day | ORAL | 2 refills | Status: DC | PRN

## 2017-06-13 NOTE — Telephone Encounter (Signed)
OK to refill. Thanks, chris 

## 2017-06-13 NOTE — Telephone Encounter (Signed)
Received faxed refill request for Valium 5 mg daily as needed from [facility]. Is this OK to refill?

## 2017-06-19 DIAGNOSIS — M62838 Other muscle spasm: Secondary | ICD-10-CM | POA: Diagnosis not present

## 2017-06-19 DIAGNOSIS — M6281 Muscle weakness (generalized): Secondary | ICD-10-CM | POA: Diagnosis not present

## 2017-06-19 DIAGNOSIS — N3942 Incontinence without sensory awareness: Secondary | ICD-10-CM | POA: Diagnosis not present

## 2017-06-19 DIAGNOSIS — R3915 Urgency of urination: Secondary | ICD-10-CM | POA: Diagnosis not present

## 2017-06-19 DIAGNOSIS — N3941 Urge incontinence: Secondary | ICD-10-CM | POA: Diagnosis not present

## 2017-06-25 ENCOUNTER — Encounter: Payer: Self-pay | Admitting: Internal Medicine

## 2017-06-25 ENCOUNTER — Ambulatory Visit (INDEPENDENT_AMBULATORY_CARE_PROVIDER_SITE_OTHER): Payer: Medicare Other | Admitting: Internal Medicine

## 2017-06-25 VITALS — BP 132/56 | HR 69 | Ht 66.0 in | Wt 159.0 lb

## 2017-06-25 DIAGNOSIS — Z95810 Presence of automatic (implantable) cardiac defibrillator: Secondary | ICD-10-CM

## 2017-06-25 DIAGNOSIS — I5022 Chronic systolic (congestive) heart failure: Secondary | ICD-10-CM | POA: Diagnosis not present

## 2017-06-25 DIAGNOSIS — I4819 Other persistent atrial fibrillation: Secondary | ICD-10-CM

## 2017-06-25 DIAGNOSIS — I481 Persistent atrial fibrillation: Secondary | ICD-10-CM | POA: Diagnosis not present

## 2017-06-25 LAB — CUP PACEART INCLINIC DEVICE CHECK
Battery Remaining Longevity: 102 mo
Battery Voltage: 3.01 V
Brady Statistic AP VS Percent: 60.27 %
Brady Statistic AS VS Percent: 18.31 %
Brady Statistic RA Percent Paced: 64.42 %
Date Time Interrogation Session: 20190319163439
HIGH POWER IMPEDANCE MEASURED VALUE: 50 Ohm
HighPow Impedance: 41 Ohm
Implantable Lead Implant Date: 20110601
Implantable Lead Implant Date: 20110601
Implantable Lead Location: 753860
Lead Channel Impedance Value: 247 Ohm
Lead Channel Impedance Value: 342 Ohm
Lead Channel Impedance Value: 475 Ohm
Lead Channel Pacing Threshold Amplitude: 0.625 V
Lead Channel Pacing Threshold Pulse Width: 0.4 ms
Lead Channel Pacing Threshold Pulse Width: 0.4 ms
Lead Channel Setting Sensing Sensitivity: 0.3 mV
MDC IDC LEAD LOCATION: 753859
MDC IDC MSMT LEADCHNL RA SENSING INTR AMPL: 0.875 mV
MDC IDC MSMT LEADCHNL RA SENSING INTR AMPL: 1 mV
MDC IDC MSMT LEADCHNL RV PACING THRESHOLD AMPLITUDE: 1.375 V
MDC IDC MSMT LEADCHNL RV SENSING INTR AMPL: 1.875 mV
MDC IDC MSMT LEADCHNL RV SENSING INTR AMPL: 2.25 mV
MDC IDC PG IMPLANT DT: 20171213
MDC IDC SET LEADCHNL RA PACING AMPLITUDE: 1.5 V
MDC IDC SET LEADCHNL RV PACING AMPLITUDE: 2.75 V
MDC IDC SET LEADCHNL RV PACING PULSEWIDTH: 0.4 ms
MDC IDC STAT BRADY AP VP PERCENT: 7.22 %
MDC IDC STAT BRADY AS VP PERCENT: 14.2 %
MDC IDC STAT BRADY RV PERCENT PACED: 21.18 %

## 2017-06-25 NOTE — Patient Instructions (Addendum)
Medication Instructions:  Your physician recommends that you continue on your current medications as directed. Please refer to the Current Medication list given to you today.  Labwork: Your physician recommends that you have lab work drawn today:  CBC and BMP  Testing/Procedures: None ordered.  Follow-Up: Your physician recommends that you schedule a follow-up appointment in:  9 months with Tommye Standard, PA  Remote monitoring is used to monitor your ICD from home. This monitoring reduces the number of office visits required to check your device to one time per year. It allows [place] to keep an eye on the functioning of your device to ensure it is working properly. You are scheduled for a device check from home on 09/24/2017. You may send your transmission at any time that day. If you have a wireless device, the transmission will be sent automatically. After your physician reviews your transmission, you will receive a postcard with your next transmission date.   Any Other Special Instructions Will Be Listed Below (If Applicable).     If you need a refill on your cardiac medications before your next appointment, please call your pharmacy.

## 2017-06-25 NOTE — Progress Notes (Signed)
[name] Patient Care Team: Binnie Rail, MD as PCP - General (Internal Medicine) Burnell Blanks, MD as Consulting Physician (Cardiology) Deboraha Sprang, MD as Consulting Physician (Cardiology) Irine Seal, MD as Attending Physician (Urology) Marcene Brawn (Optometry)   HPI  [name] is a 84 y.o. male Seen in followup for ICD implanted for aborted cardiac arrest in the setting of ischemic heart disease prior bypass grafting and redo bypass grafting most recently in 1994 with subsequent PCI.  He underwent ICD generator replacement 12/17.  He also has a history of Atrial fibrillation with Thrombo- embolic risk factors  notable for age vascular disease and hypertension and LV dysfunction.  His CHADS VASC score is 5.  Now on dabigitran  Now with persistent atrial tach    LHC 2015>>  total occlusion of left main and RCA. Patent LIMA to LAD, patent SVG to OM, patent SVG to RCA. Of note, diffuse disease in LAD both before and after LIMA graft insertio Echo in  October 2014 demonstrated normal left ventricular function. Aortic valve demonstrated moderate-severe stenosis with a mean gradient of 35 and peak 60   He underwent TAVR.  In the wake of this, anticoagulation was discontinued and he was placed on aspirin/Plavix Echo 2017 Normal LV function/ Aortic bioprosthesis .  February 2015 he had recurrent polymorphic ventricular tachycardia associated with a long short initiation sequence  that resulted in appropriate ICD discharge.      Date Cr K Hgb  12/18 1.21(GFR 56) 4.0 13         The patient denies chest pain, shortness of breath, nocturnal dyspnea, orthopnea or peripheral edema.  There have been no palpitations, lightheadedness or syncope.   No arrhythmia or shocks  No bleeding   Past Medical History:  Diagnosis Date  . Aortic stenosis, severe    s/p TAVR with Edwards Sapien 3 THV (size 26 mm, model # 9600TFX, serial # [id number])  . Arthritis   . Cardiac arrest  ([place])   . Claudication ([place])   . Coronary artery disease   . Essential hypertension   . Headache   . History of hiatal hernia   . Hyperlipidemia   . Kidney stones   . Peripheral vascular disease [facility])     Past Surgical History:  Procedure Laterality Date  . ADENOIDECTOMY    . APPENDECTOMY    . CARDIAC CATHETERIZATION   11/16/2009   . CARDIAC DEFIBRILLATOR PLACEMENT    . cataract surgery      Bilateral cataract surgery  . CORONARY ARTERY BYPASS GRAFT     in 1982 and '84 with subsequent PTCI  . EP IMPLANTABLE DEVICE N/A 03/21/2016   Procedure: ICD Generator Changeout;  Surgeon: Deboraha Sprang, MD;  Location: [place] [facility];  Service: Cardiovascular;  Laterality: N/A;  . HERNIA REPAIR    . LEFT AND RIGHT HEART CATHETERIZATION WITH CORONARY/GRAFT ANGIOGRAM N/A 02/01/2014   Procedure: LEFT AND RIGHT HEART CATHETERIZATION WITH Beatrix Fetters;  Surgeon: Sinclair Grooms, MD;  Location: [facility] CATH LAB;  Service: Cardiovascular;  Laterality: N/A;  . left elbow tendon surgery    . TEE WITHOUT CARDIOVERSION N/A 03/23/2014   Procedure: TRANSESOPHAGEAL ECHOCARDIOGRAM (TEE);  Surgeon: Burnell Blanks, MD;  Location: [place];  Service: Open Heart Surgery;  Laterality: N/A;  . TONSILLECTOMY    . TRANSCATHETER AORTIC VALVE REPLACEMENT, TRANSFEMORAL N/A 03/23/2014   Procedure: TRANSCATHETER AORTIC VALVE REPLACEMENT, TRANSFEMORAL;  Surgeon: Burnell Blanks, MD;  Location: [place];  Service: Open Heart Surgery;  Laterality: N/A;    Current Outpatient Medications  Medication Sig Dispense Refill  . acetaminophen (TYLENOL) 325 MG tablet Take 650 mg by mouth daily as needed for moderate pain or headache.    Marland Kitchen amLODipine (NORVASC) 5 MG tablet TAKE 1 TABLET BY MOUTH DAILY. 90 tablet 3  . amoxicillin (AMOXIL) 500 MG tablet Take 4 tablets by mouth 60 minutes prior to dental appointments 4 tablet 4  . calcium carbonate (TUMS - DOSED IN MG ELEMENTAL CALCIUM) 500 MG chewable tablet Chew  2 tablets by mouth daily as needed for indigestion or heartburn.    . Coenzyme Q10 (COQ10 PO) Take 1 Dose by mouth at bedtime. Take 150mg  by mouth every night (liquid)    . diazepam (VALIUM) 5 MG tablet Take 1 tablet (5 mg total) by mouth daily as needed. 30 tablet 2  . furosemide (LASIX) 40 MG tablet TAKE 1 TABLET BY MOUTH DAILY. 90 tablet 1  . KRILL OIL PO Take 1 tablet by mouth daily.    Marland Kitchen latanoprost (XALATAN) 0.005 % ophthalmic solution Place 1 drop into both eyes at bedtime.     Marland Kitchen lisinopril (PRINIVIL,ZESTRIL) 20 MG tablet TAKE 1 TABLET (20 MG TOTAL) BY MOUTH DAILY. 90 tablet 3  . Magnesium Oxide (MAG-OXIDE PO) Take 1 tablet by mouth at bedtime.     . metoprolol tartrate (LOPRESSOR) 50 MG tablet TAKE 1/2 TABLET BY MOUTH EVERY EVENING. 45 tablet 3  . Multiple Vitamin (MULTIVITAMIN) tablet Take 1 tablet by mouth at bedtime.     . naphazoline-pheniramine (NAPHCON-A) 0.025-0.3 % ophthalmic solution Place 1 drop into both eyes daily as needed for irritation.    . nitroGLYCERIN (NITROSTAT) 0.4 MG SL tablet Place 1 tablet (0.4 mg total) under the tongue every 5 (five) minutes as needed for chest pain (for chest pain (MAX of 3 doses)). 25 tablet 6  . potassium chloride (K-DUR,KLOR-CON) 10 MEQ tablet Take 10 mEq by mouth 2 (two) times daily.      . Thiamine HCl (VITAMIN B-1) 100 MG tablet Take 100 mg by mouth daily.      Alveda Reasons 20 MG TABS tablet TAKE 1 TABLET BY MOUTH DAILY WITH SUPPER 30 tablet 5  . rosuvastatin (CRESTOR) 40 MG tablet Take 1 tablet (40 mg total) by mouth daily. 90 tablet 3   No current facility-administered medications for this visit.     Allergies  Allergen Reactions  . Cyclobenzaprine Hcl Other (See Comments)    Unspecified reaction pt doesn't know what reaction  . Tetracycline     Rash     Review of Systems negative except from HPI and PMH  Physical Exam BP (!) 132/56   Pulse 69   Ht 5\' 6"  (1.676 m)   Wt 159 lb (72.1 kg)   SpO2 97%   BMI 25.66 kg/m  Well  developed and nourished in no acute distress HENT normal Neck supple with JVP-flat Carotids brisk and full without bruits Clear Device pocket well healed; without hematoma or erythema.  There is no tethering  Regular rate and rhythm, 2/4 diastolic Abd-soft with active BS without hepatomegaly No Clubbing cyanosis edema Skin-warm and dry A & Oriented  Grossly normal sensory and motor function   ECg Personally reviewed  Apacing @ 60 24/15/44 With rare dropped beat (MVP algorithm)   Assessment and  Plan  Sudden cardiac death-aborted -    AUTOMATIC IMPLANTABLE CARDIAC DEFIBRILLATOR SITU   .   S/P CABG (coronary artery bypass  graft) -     Ischemic Cardiomyopathy  Aortic stenosis-s/p TAVR   Aortic insufficiency  new  Renal Insufficiency grade 3  Benign hypertensive heart disease   Atrial fibrillation//atrial tach    On Anticoagulation;  No bleeding issues   Renal  Function is on the cusp for dosing changing with Rivaroxaban  No intercurrent Ventricular tachycardia  Without symptoms of ischemia  AI murmur new  Pt to see Dr Elder Love next week  Will reach out-- may need echo   More than 50% of 40 min was spent in counseling related to the above

## 2017-06-26 LAB — CBC WITH DIFFERENTIAL/PLATELET
BASOS: 0 %
Basophils Absolute: 0 10*3/uL (ref 0.0–0.2)
EOS (ABSOLUTE): 0.1 10*3/uL (ref 0.0–0.4)
EOS: 3 %
HEMATOCRIT: 40.9 % (ref 37.5–51.0)
Hemoglobin: 13.3 g/dL (ref 13.0–17.7)
IMMATURE GRANS (ABS): 0 10*3/uL (ref 0.0–0.1)
IMMATURE GRANULOCYTES: 0 %
LYMPHS: 29 %
Lymphocytes Absolute: 1.6 10*3/uL (ref 0.7–3.1)
MCH: 30.6 pg (ref 26.6–33.0)
MCHC: 32.5 g/dL (ref 31.5–35.7)
MCV: 94 fL (ref 79–97)
MONOCYTES: 9 %
Monocytes Absolute: 0.5 10*3/uL (ref 0.1–0.9)
NEUTROS PCT: 59 %
Neutrophils Absolute: 3.3 10*3/uL (ref 1.4–7.0)
PLATELETS: 126 10*3/uL — AB (ref 150–379)
RBC: 4.34 x10E6/uL (ref 4.14–5.80)
RDW: 14.5 % (ref 12.3–15.4)
WBC: 5.5 10*3/uL (ref 3.4–10.8)

## 2017-06-26 LAB — BASIC METABOLIC PANEL
BUN/Creatinine Ratio: 26 — ABNORMAL HIGH (ref 10–24)
BUN: 27 mg/dL (ref 8–27)
CALCIUM: 10.1 mg/dL (ref 8.6–10.2)
CO2: 24 mmol/L (ref 20–29)
CREATININE: 1.02 mg/dL (ref 0.76–1.27)
Chloride: 106 mmol/L (ref 96–106)
GFR calc Af Amer: 79 mL/min/{1.73_m2} (ref >59)
GFR, EST NON AFRICAN AMERICAN: 69 mL/min/{1.73_m2} (ref >59)
Glucose: 90 mg/dL (ref 65–99)
Potassium: 4.5 mmol/L (ref 3.5–5.2)
Sodium: 144 mmol/L (ref 134–144)

## 2017-07-03 DIAGNOSIS — R3915 Urgency of urination: Secondary | ICD-10-CM | POA: Diagnosis not present

## 2017-07-03 DIAGNOSIS — M62838 Other muscle spasm: Secondary | ICD-10-CM | POA: Diagnosis not present

## 2017-07-03 DIAGNOSIS — N3946 Mixed incontinence: Secondary | ICD-10-CM | POA: Diagnosis not present

## 2017-07-03 DIAGNOSIS — N3942 Incontinence without sensory awareness: Secondary | ICD-10-CM | POA: Diagnosis not present

## 2017-07-03 DIAGNOSIS — M6281 Muscle weakness (generalized): Secondary | ICD-10-CM | POA: Diagnosis not present

## 2017-07-05 ENCOUNTER — Encounter: Payer: Self-pay | Admitting: Cardiovascular Disease

## 2017-07-05 ENCOUNTER — Ambulatory Visit (INDEPENDENT_AMBULATORY_CARE_PROVIDER_SITE_OTHER): Payer: Medicare Other | Admitting: Cardiovascular Disease

## 2017-07-05 VITALS — BP 120/78 | HR 81 | Ht 66.0 in | Wt 158.0 lb

## 2017-07-05 DIAGNOSIS — E7849 Other hyperlipidemia: Secondary | ICD-10-CM

## 2017-07-05 DIAGNOSIS — I350 Nonrheumatic aortic (valve) stenosis: Secondary | ICD-10-CM

## 2017-07-05 DIAGNOSIS — I481 Persistent atrial fibrillation: Secondary | ICD-10-CM

## 2017-07-05 DIAGNOSIS — I2510 Atherosclerotic heart disease of native coronary artery without angina pectoris: Secondary | ICD-10-CM

## 2017-07-05 DIAGNOSIS — I4819 Other persistent atrial fibrillation: Secondary | ICD-10-CM

## 2017-07-05 NOTE — Patient Instructions (Signed)

## 2017-07-05 NOTE — Progress Notes (Addendum)
Chief Complaint  Patient presents with  . Coronary Artery Disease     History of Present Illness: 84 yo male with history of persistent atrial fibrillation, CAD s/p CABG (1982 with redo 1994), ischemic cardiomyopathy, out-of-hospital cardiac arrest in 2011 with successful resuscitation and subsequent placement of [employer] ICD, and aortic stenosis s/p TAVR who is here today for cardiac follow up. I performed his TAVR procedure on 03/23/14. His cardiac issues had been followed by Dr. Mare Ferrari.  Echo January 2017 with preserved LV systolic function with normally functioning bioprosthetic aortic valve. There is trivial AI. Cardiac cath October 2015 with total occlusion of left main and RCA. Patent LIMA to LAD, patent SVG to OM, patent SVG to RCA. Of note, diffuse disease in LAD both before and after LIMA graft insertion. He has had recent trouble with prostatitis with trouble emptying his bladder and leakage.   He is here today for follow up. The patient denies any chest pain, dyspnea, palpitations, lower extremity edema, orthopnea, PND, dizziness, near syncope or syncope. He is playing golf. He has some fatigue in the afternoon.   Primary Care Physician: Binnie Rail, MD  Past Medical History:  Diagnosis Date  . Aortic stenosis, severe    s/p TAVR with Edwards Sapien 3 THV (size 26 mm, model # 9600TFX, serial # [id number])  . Arthritis   . Cardiac arrest ([place])   . Claudication ([place])   . Coronary artery disease   . Essential hypertension   . Headache   . History of hiatal hernia   . Hyperlipidemia   . Kidney stones   . Peripheral vascular disease [facility])     Past Surgical History:  Procedure Laterality Date  . ADENOIDECTOMY    . APPENDECTOMY    . CARDIAC CATHETERIZATION   11/16/2009   . CARDIAC DEFIBRILLATOR PLACEMENT    . cataract surgery      Bilateral cataract surgery  . CORONARY ARTERY BYPASS GRAFT     in 1982 and '84 with subsequent PTCI  . EP IMPLANTABLE DEVICE N/A  03/21/2016   Procedure: ICD Generator Changeout;  Surgeon: Deboraha Sprang, MD;  Location: [place] CV LAB;  Service: Cardiovascular;  Laterality: N/A;  . HERNIA REPAIR    . LEFT AND RIGHT HEART CATHETERIZATION WITH CORONARY/GRAFT ANGIOGRAM N/A 02/01/2014   Procedure: LEFT AND RIGHT HEART CATHETERIZATION WITH Beatrix Fetters;  Surgeon: Sinclair Grooms, MD;  Location: [facility] CATH LAB;  Service: Cardiovascular;  Laterality: N/A;  . left elbow tendon surgery    . TEE WITHOUT CARDIOVERSION N/A 03/23/2014   Procedure: TRANSESOPHAGEAL ECHOCARDIOGRAM (TEE);  Surgeon: Burnell Blanks, MD;  Location: [place];  Service: Open Heart Surgery;  Laterality: N/A;  . TONSILLECTOMY    . TRANSCATHETER AORTIC VALVE REPLACEMENT, TRANSFEMORAL N/A 03/23/2014   Procedure: TRANSCATHETER AORTIC VALVE REPLACEMENT, TRANSFEMORAL;  Surgeon: Burnell Blanks, MD;  Location: [place];  Service: Open Heart Surgery;  Laterality: N/A;    Current Outpatient Medications  Medication Sig Dispense Refill  . acetaminophen (TYLENOL) 325 MG tablet Take 650 mg by mouth daily as needed for moderate pain or headache.    Marland Kitchen amLODipine (NORVASC) 5 MG tablet TAKE 1 TABLET BY MOUTH DAILY. 90 tablet 3  . amoxicillin (AMOXIL) 500 MG tablet Take 4 tablets by mouth 60 minutes prior to dental appointments 4 tablet 4  . calcium carbonate (TUMS - DOSED IN MG ELEMENTAL CALCIUM) 500 MG chewable tablet Chew 2 tablets by mouth daily as needed for indigestion or  heartburn.    . Coenzyme Q10 (COQ10 PO) Take 1 Dose by mouth at bedtime. Take 150mg  by mouth every night (liquid)    . diazepam (VALIUM) 5 MG tablet Take 1 tablet (5 mg total) by mouth daily as needed. 30 tablet 2  . furosemide (LASIX) 40 MG tablet TAKE 1 TABLET BY MOUTH DAILY. 90 tablet 1  . KRILL OIL PO Take 1 tablet by mouth daily.    Marland Kitchen latanoprost (XALATAN) 0.005 % ophthalmic solution Place 1 drop into both eyes at bedtime.     Marland Kitchen lisinopril (PRINIVIL,ZESTRIL) 20 MG tablet  TAKE 1 TABLET (20 MG TOTAL) BY MOUTH DAILY. 90 tablet 3  . Magnesium Oxide (MAG-OXIDE PO) Take 1 tablet by mouth at bedtime.     . metoprolol tartrate (LOPRESSOR) 50 MG tablet TAKE 1/2 TABLET BY MOUTH EVERY EVENING. 45 tablet 3  . Multiple Vitamin (MULTIVITAMIN) tablet Take 1 tablet by mouth at bedtime.     . naphazoline-pheniramine (NAPHCON-A) 0.025-0.3 % ophthalmic solution Place 1 drop into both eyes daily as needed for irritation.    . nitroGLYCERIN (NITROSTAT) 0.4 MG SL tablet Place 1 tablet (0.4 mg total) under the tongue every 5 (five) minutes as needed for chest pain (for chest pain (MAX of 3 doses)). 25 tablet 6  . potassium chloride (K-DUR,KLOR-CON) 10 MEQ tablet Take 10 mEq by mouth 2 (two) times daily.      . Thiamine HCl (VITAMIN B-1) 100 MG tablet Take 100 mg by mouth daily.      Alveda Reasons 20 MG TABS tablet TAKE 1 TABLET BY MOUTH DAILY WITH SUPPER 30 tablet 5  . rosuvastatin (CRESTOR) 40 MG tablet Take 1 tablet (40 mg total) by mouth daily. 90 tablet 3   No current facility-administered medications for this visit.     Allergies  Allergen Reactions  . Cyclobenzaprine Hcl Other (See Comments)    Unspecified reaction pt doesn't know what reaction  . Tetracycline     Rash     Social History   Socioeconomic History  . Marital status: Married    Spouse name: Not on file  . Number of children: 3  . Years of education: Not on file  . Highest education level: Not on file  Occupational History  . Occupation: Retired  Scientific laboratory technician  . Financial resource strain: Not hard at all  . Food insecurity:    Worry: Never true    Inability: Never true  . Transportation needs:    Medical: No    Non-medical: No  Tobacco Use  . Smoking status: Former Smoker    Last attempt to quit: 10/25/1952    Years since quitting: 64.7  . Smokeless tobacco: Never Used  Substance and Sexual Activity  . Alcohol use: Yes    Alcohol/week: 3.0 - 3.6 oz    Types: 5 - 6 Glasses of wine per week     Comment: 5-6 times a week  . Drug use: No  . Sexual activity: Never  Lifestyle  . Physical activity:    Days per week: Not on file    Minutes per session: Not on file  . Stress: Only a little  Relationships  . Social connections:    Talks on phone: More than three times a week    Gets together: More than three times a week    Attends religious service: More than 4 times per year    Active member of club or organization: Not on file    Attends  meetings of clubs or organizations: More than 4 times per year    Relationship status: Married  . Intimate partner violence:    Fear of current or ex partner: No    Emotionally abused: No    Physically abused: No    Forced sexual activity: No  Other Topics Concern  . Not on file  Social History Narrative  . Not on file    Family History  Problem Relation Age of Onset  . Heart attack Father 23  . Coronary artery disease Other        family history is dignificant for early CAD in several members    Review of Systems:  As stated in the HPI and otherwise negative.   BP 120/78   Pulse 81   Ht 5\' 6"  (1.676 m)   Wt 158 lb (71.7 kg)   SpO2 94%   BMI 25.50 kg/m   Physical Examination:  General: Well developed, well nourished, NAD  HEENT: OP clear, mucus membranes moist  SKIN: warm, dry. No rashes. Neuro: No focal deficits  Musculoskeletal: Muscle strength 5/5 all ext  Psychiatric: Mood and affect normal  Neck: No JVD, no carotid bruits, no thyromegaly, no lymphadenopathy.  Lungs:Clear bilaterally, no wheezes, rhonci, crackles Cardiovascular: Regular rate and rhythm. No murmurs, gallops or rubs. Abdomen:Soft. Bowel sounds present. Non-tender.  Extremities: No lower extremity edema. Pulses are 2 + in the bilateral DP/PT.  Echo January 2017: Left ventricle: Posterior basal hypokinesis The cavity size was   normal. Wall thickness was normal. Systolic function was normal.   The estimated ejection fraction was in the range of 50% to  55%. - Aortic valve: Post TAVR with 26 mm Sapien 3 valve No perivalvular   regurgitation. - Left atrium: The atrium was moderately dilated. - Atrial septum: No defect or patent foramen ovale was identified.  EKG:  EKG is not   ordered today. The ekg ordered today demonstrates   Recent Labs: 03/21/2017: ALT 14 06/25/2017: BUN 27; Creatinine, Ser 1.02; Hemoglobin 13.3; Platelets 126; Potassium 4.5; Sodium 144   Lipid Panel    Component Value Date/Time   CHOL 125 03/21/2017 0814   TRIG 99 03/21/2017 0814   HDL 37 (L) 03/21/2017 0814   CHOLHDL 3.4 03/21/2017 0814   CHOLHDL 3.1 12/05/2015 1002   VLDL 18 12/05/2015 1002   LDLCALC 68 03/21/2017 0814     Wt Readings from Last 3 Encounters:  07/05/17 158 lb (71.7 kg)  06/25/17 159 lb (72.1 kg)  04/18/17 155 lb (70.3 kg)     Other studies Reviewed: Additional studies/ records that were reviewed today include: . Review of the above records demonstrates:    Assessment and Plan:   1. Severe Aortic valve stenosis: He is s/p TAVR December 2015. He has done well. He is not having dyspnea or dizziness. He has a diastolic murmur on exam. Repeat echo now. Will continue Xarelto. (dosing correct with GFR 57, creat 1.0, weight over 60 kg). He will continue to use SBE prophylaxis prior to indicated procedures.   2. CAD s/p CABG: He had CABG in 1982 and redo CABG in 1994. Last cath October 2015 with stable diffuse disease. No chest pain. Will continue statin, beta blocker. He is not on ASA since he is on Eliquis.     3. Atrial fibrillation, persistent: His heart rate is controlled on Toprol. Will continue Xarelto. (see above regarding dosing)  4. History of cardiac arrest: ICD in place.  Followed by Dr. Caryl Comes  5. HLD: Lipids are well controlled. Continue statin.   Addendum: 6. Daytime drowsiness: Pt has symptoms of OSA. Will refer for sleep study.    Current medicines are reviewed at length with the patient today.  The patient does not have  concerns regarding medicines.  The following changes have been made:  no change  Labs/ tests ordered today include:   Orders Placed This Encounter  Procedures  . ECHOCARDIOGRAM COMPLETE   Disposition:   FU with APP on my team in 6 months.      Signed, Lauree Chandler, MD 07/05/2017 3:02 PM    [facility] [address], [place], [place]  [address] Phone: [phone]; Fax: [phone]

## 2017-07-17 ENCOUNTER — Encounter: Payer: Self-pay | Admitting: Physician Assistant

## 2017-07-19 ENCOUNTER — Other Ambulatory Visit: Payer: Self-pay

## 2017-07-19 ENCOUNTER — Ambulatory Visit (HOSPITAL_COMMUNITY): Payer: Medicare Other | Attending: Cardiology

## 2017-07-19 DIAGNOSIS — I2510 Atherosclerotic heart disease of native coronary artery without angina pectoris: Secondary | ICD-10-CM | POA: Diagnosis not present

## 2017-07-19 DIAGNOSIS — Z95811 Presence of heart assist device: Secondary | ICD-10-CM | POA: Diagnosis not present

## 2017-07-19 DIAGNOSIS — I10 Essential (primary) hypertension: Secondary | ICD-10-CM | POA: Diagnosis not present

## 2017-07-19 DIAGNOSIS — I310 Chronic adhesive pericarditis: Secondary | ICD-10-CM | POA: Insufficient documentation

## 2017-07-19 DIAGNOSIS — Z8674 Personal history of sudden cardiac arrest: Secondary | ICD-10-CM | POA: Insufficient documentation

## 2017-07-19 DIAGNOSIS — Z952 Presence of prosthetic heart valve: Secondary | ICD-10-CM | POA: Insufficient documentation

## 2017-07-19 DIAGNOSIS — I4891 Unspecified atrial fibrillation: Secondary | ICD-10-CM | POA: Insufficient documentation

## 2017-07-19 DIAGNOSIS — E785 Hyperlipidemia, unspecified: Secondary | ICD-10-CM | POA: Diagnosis not present

## 2017-07-19 DIAGNOSIS — I350 Nonrheumatic aortic (valve) stenosis: Secondary | ICD-10-CM | POA: Diagnosis not present

## 2017-07-19 MED ORDER — PERFLUTREN LIPID MICROSPHERE
1.0000 mL | INTRAVENOUS | Status: AC | PRN
  Administered 2017-07-19: 3 mL via INTRAVENOUS

## 2017-07-22 DIAGNOSIS — M6281 Muscle weakness (generalized): Secondary | ICD-10-CM | POA: Diagnosis not present

## 2017-07-22 DIAGNOSIS — N3942 Incontinence without sensory awareness: Secondary | ICD-10-CM | POA: Diagnosis not present

## 2017-07-22 DIAGNOSIS — R3915 Urgency of urination: Secondary | ICD-10-CM | POA: Diagnosis not present

## 2017-07-22 DIAGNOSIS — N3941 Urge incontinence: Secondary | ICD-10-CM | POA: Diagnosis not present

## 2017-07-22 DIAGNOSIS — M62838 Other muscle spasm: Secondary | ICD-10-CM | POA: Diagnosis not present

## 2017-07-30 ENCOUNTER — Other Ambulatory Visit: Payer: Self-pay | Admitting: Cardiovascular Disease

## 2017-08-05 ENCOUNTER — Other Ambulatory Visit: Payer: Self-pay | Admitting: Cardiovascular Disease

## 2017-08-07 NOTE — Telephone Encounter (Signed)
Outpatient Medication Detail    Disp Refills Start End   metoprolol tartrate (LOPRESSOR) 50 MG tablet 45 tablet 3 02/11/2017    Sig: TAKE 1/2 TABLET BY MOUTH EVERY EVENING.   Sent to pharmacy as: metoprolol tartrate (LOPRESSOR) 50 MG tablet   E-Prescribing Status: Receipt confirmed by pharmacy (02/11/2017 11:53 AM EST)   Pharmacy   [place], [place]

## 2017-08-12 ENCOUNTER — Other Ambulatory Visit: Payer: Self-pay | Admitting: Cardiovascular Disease

## 2017-08-13 DIAGNOSIS — R3915 Urgency of urination: Secondary | ICD-10-CM | POA: Diagnosis not present

## 2017-08-13 DIAGNOSIS — N3942 Incontinence without sensory awareness: Secondary | ICD-10-CM | POA: Diagnosis not present

## 2017-08-13 DIAGNOSIS — M62838 Other muscle spasm: Secondary | ICD-10-CM | POA: Diagnosis not present

## 2017-08-13 DIAGNOSIS — N3946 Mixed incontinence: Secondary | ICD-10-CM | POA: Diagnosis not present

## 2017-08-13 DIAGNOSIS — M6281 Muscle weakness (generalized): Secondary | ICD-10-CM | POA: Diagnosis not present

## 2017-08-19 DIAGNOSIS — N3941 Urge incontinence: Secondary | ICD-10-CM | POA: Diagnosis not present

## 2017-08-19 DIAGNOSIS — R351 Nocturia: Secondary | ICD-10-CM | POA: Diagnosis not present

## 2017-08-20 DIAGNOSIS — L218 Other seborrheic dermatitis: Secondary | ICD-10-CM | POA: Diagnosis not present

## 2017-08-20 DIAGNOSIS — Z85828 Personal history of other malignant neoplasm of skin: Secondary | ICD-10-CM | POA: Diagnosis not present

## 2017-08-20 DIAGNOSIS — L821 Other seborrheic keratosis: Secondary | ICD-10-CM | POA: Diagnosis not present

## 2017-08-20 DIAGNOSIS — L570 Actinic keratosis: Secondary | ICD-10-CM | POA: Diagnosis not present

## 2017-09-03 ENCOUNTER — Other Ambulatory Visit: Payer: Self-pay | Admitting: Cardiovascular Disease

## 2017-09-06 ENCOUNTER — Telehealth: Payer: Self-pay | Admitting: Cardiovascular Disease

## 2017-09-06 DIAGNOSIS — G4719 Other hypersomnia: Secondary | ICD-10-CM

## 2017-09-06 NOTE — Telephone Encounter (Signed)
New message   Patient calling to request a sleep study.

## 2017-09-06 NOTE — Telephone Encounter (Signed)
Left message to call back  

## 2017-09-09 NOTE — Telephone Encounter (Signed)
I spoke with pt. He reports he took sleep scale test and scored 18/20.  He falls asleep sitting in a chair frequently and takes naps in the afternoon.  Does not snore. States Dr. Angelena Form discussed sleep study with him at last appointment but pt did not want to do at that time.  He would now like to proceed with sleep study. I told pt I would send message to Dr. Angelena Form to see if OK to order test.

## 2017-09-09 NOTE — Telephone Encounter (Signed)
Follow up    Patient is returning call about sleep study.

## 2017-09-13 NOTE — Telephone Encounter (Signed)
Can we arrange referral to Dr. Radford Pax or Dr. Claiborne Billings for a sleep study. If they do not require an office visit first, we can just arrange a sleep study. Thanks, chris

## 2017-09-13 NOTE — Telephone Encounter (Signed)
Sleep study ordered and message sent to sleep study pool and Gae Bon for pre-auth and scheduling.

## 2017-09-16 ENCOUNTER — Telehealth: Payer: Self-pay | Admitting: *Deleted

## 2017-09-16 NOTE — Telephone Encounter (Signed)
-----   Message from Teressa Senter, RN sent at 09/13/2017 10:26 AM EDT ----- Regarding: sleep study  Sleep study ordered per Dr. Angelena Form  Thanks Mearl Latin

## 2017-09-24 NOTE — Telephone Encounter (Signed)
Patient is scheduled for lab study on 10/16/17. Patient understands his sleep study will be done at [facility] sleep lab. Patient understands he will receive a sleep packet in a week or so. Patient understands to call if he does not receive the sleep packet in a timely manner. Patient agrees with treatment and thanked me for call.

## 2017-10-16 ENCOUNTER — Ambulatory Visit (HOSPITAL_BASED_OUTPATIENT_CLINIC_OR_DEPARTMENT_OTHER): Payer: Medicare Other | Attending: Cardiovascular Disease | Admitting: Cardiology

## 2017-10-16 VITALS — Ht 66.5 in | Wt 154.0 lb

## 2017-10-16 DIAGNOSIS — G4733 Obstructive sleep apnea (adult) (pediatric): Secondary | ICD-10-CM | POA: Diagnosis not present

## 2017-10-16 DIAGNOSIS — G4730 Sleep apnea, unspecified: Secondary | ICD-10-CM | POA: Diagnosis not present

## 2017-10-16 DIAGNOSIS — G4719 Other hypersomnia: Secondary | ICD-10-CM | POA: Diagnosis not present

## 2017-10-17 ENCOUNTER — Other Ambulatory Visit (HOSPITAL_BASED_OUTPATIENT_CLINIC_OR_DEPARTMENT_OTHER): Payer: Self-pay

## 2017-10-17 DIAGNOSIS — Z961 Presence of intraocular lens: Secondary | ICD-10-CM | POA: Diagnosis not present

## 2017-10-17 DIAGNOSIS — H02834 Dermatochalasis of left upper eyelid: Secondary | ICD-10-CM | POA: Diagnosis not present

## 2017-10-17 DIAGNOSIS — H04129 Dry eye syndrome of unspecified lacrimal gland: Secondary | ICD-10-CM | POA: Diagnosis not present

## 2017-10-17 DIAGNOSIS — H401231 Low-tension glaucoma, bilateral, mild stage: Secondary | ICD-10-CM | POA: Diagnosis not present

## 2017-10-17 DIAGNOSIS — G4719 Other hypersomnia: Secondary | ICD-10-CM

## 2017-10-18 NOTE — Procedures (Signed)
Patient Name: [name], [name] Date: 10/16/2017   Gender: Male  D.O.B: 1933/08/09  Age (years): 75  Referring Provider: Burnell Blanks  Height (inches): 78  Interpreting Physician: Fransico Him MD, ABSM  Weight (lbs): 154  RPSGT: Laren Everts  BMI: 24  MRN: [id number]  Neck Size: 15.00   CLINICAL INFORMATION  Sleep Study Type: Split Night CPAP Indication for sleep study: Excessive Daytime Sleepiness, Fatigue, Witnessed Apneas Epworth Sleepiness Score: 19  SLEEP STUDY TECHNIQUE  As per the AASM Manual for the Scoring of Sleep and Associated Events v2.3 (April 2016) with a hypopnea requiring 4% desaturations. The channels recorded and monitored were frontal, central and occipital EEG, electrooculogram (EOG), submentalis EMG (chin), nasal and oral airflow, thoracic and abdominal wall motion, anterior tibialis EMG, snore microphone, electrocardiogram, and pulse oximetry. Continuous positive airway pressure (CPAP) was initiated when the patient met split night criteria and was titrated according to treat sleep-disordered breathing.  MEDICATIONS  Medications self-administered by patient taken the night of the study : COQ-10, DIAZEPAM, Krill Oil, METOPROLOL TARTRATE, Rosuvastatin, THIAMINE, Xarelto  RESPIRATORY PARAMETERS  Diagnostic  Total AHI (/hr): 36.8 RDI (/hr): 45.2 OA Index (/hr): - CA Index (/hr): 0.0  REM AHI (/hr): 58.6 NREM AHI (/hr): 32.2 Supine AHI (/hr): N/A Non-supine AHI (/hr): 36.85  Min O2 Sat (%): 79.0 Mean O2 (%): 89.7 Time below 88% (min): 33.3      Titration Optimal Pressure (cm): 5 AHI at Optimal Pressure (/hr): 0.0 Min O2 at Optimal Pressure (%): 88.0  Supine % at Optimal (%): 0 Sleep % at Optimal (%): 28       SLEEP ARCHITECTURE  The recording time for the entire night was 414.3 minutes. During a baseline period of 160.9 minutes, the patient slept for 122.1 minutes in REM and nonREM, yielding a sleep efficiency of 75.9%%. Sleep onset  after lights out was 10.3 minutes with a REM latency of 67.0 minutes. The patient spent 16.4%% of the night in stage N1 sleep, 66.0%% in stage N2 sleep, 0.0%% in stage N3 and 17.6%% in REM. During the titration period of 244.7 minutes, the patient slept for 68.5 minutes in REM and nonREM, yielding a sleep efficiency of 28.0%%. Sleep onset after CPAP initiation was 173.1 minutes with a REM latency of 6.5 minutes. The patient spent 3.6%% of the night in stage N1 sleep, 5.1%% in stage N2 sleep, 0.0%% in stage N3 and 91.2%% in REM.  CARDIAC DATA  The 2 lead EKG demonstrated sinus rhythm. The mean heart rate was 100.0 beats per minute. Other EKG findings include: PVCs.   LEG MOVEMENT DATA  The total Periodic Limb Movements of Sleep (PLMS) were 0. The PLMS index was 0.0 .  IMPRESSIONS  - Severe obstructive sleep apnea occurred during the diagnostic portion of the study (AHI = 36.8/hour). An optimal PAP pressure was selected for this patient ( 5 cm of water)  - No significant central sleep apnea occurred during the diagnostic portion of the study (CAI = 0.0/hour).  - Mild oxygen desaturation was noted during the diagnostic portion of the study (Min O2 = 79.0%).  - The patient snored with soft snoring volume during the diagnostic portion of the study.  - EKG findings include PVCs.  - Clinically significant periodic limb movements did not occur during sleep.  DIAGNOSIS  - Obstructive Sleep Apnea (327.23 [G47.33 ICD-10]) - Nocturnal Hypoxemia - PVCs  RECOMMENDATIONS  - Trial of CPAP therapy on 5 cm H2O with a Medium  size Resmed Nasal CPAP Mask AirFit N30i mask and heated humidification. - Avoid alcohol, sedatives and other CNS depressants that may worsen sleep apnea and disrupt normal sleep architecture.  - Sleep hygiene should be reviewed to assess factors that may improve sleep quality.  - Weight management and regular exercise should be initiated or continued.  - Return to Sleep Center for  re-evaluation after 10 weeks of therapy  [Electronically signed] 10/18/2017 01:17 PM Fransico Him MD, ABSM  Diplomate, American Board of Sleep Medicine

## 2017-10-22 ENCOUNTER — Telehealth: Payer: Self-pay | Admitting: *Deleted

## 2017-10-22 NOTE — Telephone Encounter (Signed)
Informed patient of sleep study results and patient understanding was verbalized. Patient understands his sleep study showed he has significant sleep apnea and had successful PAP titration and will be set up with PAP unit.  Upon patient request DME selection is [facility] Patient understands he will be contacted by [facility] to set up his cpap. Patient understands to call if [facility] does not contact him with new setup in a timely manner. Patient understands they will be called once confirmation has been received from [facility] that they have received their new machine to schedule 10 week follow up appointment.  [facility] notified of new cpap order  Please add to airview Patient was grateful for the call and thanked me.

## 2017-10-22 NOTE — Telephone Encounter (Signed)
-----   Message from Sueanne Margarita, MD sent at 10/18/2017  1:20 PM EDT ----- Please let patient know that they have significant sleep apnea and had successful PAP titration and will be set up with PAP unit.  Please let DME know that order is in EPIC.  Please set patient up for OV in 10 weeks

## 2017-10-28 ENCOUNTER — Encounter: Payer: Medicare Other | Admitting: *Deleted

## 2017-10-28 ENCOUNTER — Telehealth: Payer: Self-pay

## 2017-10-28 NOTE — Telephone Encounter (Signed)
LMOVM reminding pt to send remote transmission.   

## 2017-10-30 ENCOUNTER — Encounter: Payer: Self-pay | Admitting: Cardiology

## 2017-10-30 NOTE — Progress Notes (Signed)
Letter  

## 2017-10-31 DIAGNOSIS — B349 Viral infection, unspecified: Secondary | ICD-10-CM | POA: Diagnosis not present

## 2017-10-31 DIAGNOSIS — R52 Pain, unspecified: Secondary | ICD-10-CM | POA: Diagnosis not present

## 2017-11-20 DIAGNOSIS — N401 Enlarged prostate with lower urinary tract symptoms: Secondary | ICD-10-CM | POA: Diagnosis not present

## 2017-11-20 DIAGNOSIS — R351 Nocturia: Secondary | ICD-10-CM | POA: Diagnosis not present

## 2017-11-20 DIAGNOSIS — N3941 Urge incontinence: Secondary | ICD-10-CM | POA: Diagnosis not present

## 2017-11-25 ENCOUNTER — Other Ambulatory Visit: Payer: Self-pay | Admitting: *Deleted

## 2017-11-25 DIAGNOSIS — F419 Anxiety disorder, unspecified: Secondary | ICD-10-CM

## 2017-11-25 MED ORDER — DIAZEPAM 5 MG PO TABS: 5.0000 mg | ORAL_TABLET | Freq: Every day | ORAL | 2 refills | Status: DC | PRN

## 2017-11-25 NOTE — Telephone Encounter (Signed)
Okay to refill? Please advise. Thanks, MI 

## 2017-11-25 NOTE — Telephone Encounter (Signed)
Called rx in to pharmacy as requested.

## 2017-11-25 NOTE — Telephone Encounter (Signed)
OK to refill

## 2017-11-25 NOTE — Telephone Encounter (Signed)
Patient has a 10 week follow up appointment scheduled for .10/30 2019 at 1:00pm. Patient understands she needs to keep this appointment for insurance compliance. Patient was grateful for the call and thanked me.

## 2017-12-10 NOTE — Progress Notes (Signed)
Chief Complaint  Patient presents with  . Follow-up    CAD     History of Present Illness: 84 yo male with history of persistent atrial fibrillation, CAD s/p CABG (1982 with redo 1994), ischemic cardiomyopathy, out-of-hospital cardiac arrest in 2011 with successful resuscitation and subsequent placement of Medtronic ICD, and aortic stenosis s/p TAVR who is here today for cardiac follow up. I performed his TAVR procedure on 03/23/14. His cardiac issues had been followed by Dr. Mare Ferrari.  Echo January 2017 with preserved LV systolic function with normally functioning bioprosthetic aortic valve. There is trivial AI. Cardiac cath October 2015 with total occlusion of left main and RCA. Patent LIMA to LAD, patent SVG to OM, patent SVG to RCA. Of note, diffuse disease in LAD both before and after LIMA graft insertion. Echo April 2019 with LSLH=73-42%, grade 2 diastolic dysfunction. The bioprosthetic AVR is working well with trivial perivalvular leak, mean gradient 14 mmHg (10 mmHg by echo in 2017).   He is here today for follow up. The patient denies any chest pain, dyspnea, palpitations, lower extremity edema, orthopnea, PND, dizziness, near syncope or syncope. He is still playing golf. His main complaint is urinary leakage.   Primary Care Physician: Binnie Rail, MD  Past Medical History:  Diagnosis Date  . Aortic stenosis, severe    s/p TAVR with Edwards Sapien 3 THV (size 26 mm, model # 9600TFX, serial # [id number])  . Arthritis   . Cardiac arrest ([place])   . Claudication ([place])   . Coronary artery disease   . Essential hypertension   . Headache   . History of hiatal hernia   . Hyperlipidemia   . Kidney stones   . Peripheral vascular disease [facility])     Past Surgical History:  Procedure Laterality Date  . ADENOIDECTOMY    . APPENDECTOMY    . CARDIAC CATHETERIZATION   11/16/2009   . CARDIAC DEFIBRILLATOR PLACEMENT    . cataract surgery      Bilateral cataract surgery  . CORONARY ARTERY  BYPASS GRAFT     in 1982 and '84 with subsequent PTCI  . EP IMPLANTABLE DEVICE N/A 03/21/2016   Procedure: ICD Generator Changeout;  Surgeon: Deboraha Sprang, MD;  Location: [place] [facility];  Service: Cardiovascular;  Laterality: N/A;  . HERNIA REPAIR    . LEFT AND RIGHT HEART CATHETERIZATION WITH CORONARY/GRAFT ANGIOGRAM N/A 02/01/2014   Procedure: LEFT AND RIGHT HEART CATHETERIZATION WITH Beatrix Fetters;  Surgeon: Sinclair Grooms, MD;  Location: [facility] C[facility];  Service: Cardiovascular;  Laterality: N/A;  . left elbow tendon surgery    . TEE WITHOUT CARDIOVERSION N/A 03/23/2014   Procedure: TRANSESOPHAGEAL ECHOCARDIOGRAM (TEE);  Surgeon: Burnell Blanks, MD;  Location: [place];  Service: Open Heart Surgery;  Laterality: N/A;  . TONSILLECTOMY    . TRANSCATHETER AORTIC VALVE REPLACEMENT, TRANSFEMORAL N/A 03/23/2014   Procedure: TRANSCATHETER AORTIC VALVE REPLACEMENT, TRANSFEMORAL;  Surgeon: Burnell Blanks, MD;  Location: [place];  Service: Open Heart Surgery;  Laterality: N/A;    Current Outpatient Medications  Medication Sig Dispense Refill  . acetaminophen (TYLENOL) 325 MG tablet Take 650 mg by mouth daily as needed for moderate pain or headache.    Marland Kitchen amLODipine (NORVASC) 5 MG tablet TAKE 1 TABLET BY MOUTH DAILY. 90 tablet 3  . amoxicillin (AMOXIL) 500 MG tablet Take 4 tablets by mouth 60 minutes prior to dental appointments 4 tablet 4  . calcium carbonate (TUMS - DOSED IN MG  ELEMENTAL CALCIUM) 500 MG chewable tablet Chew 2 tablets by mouth daily as needed for indigestion or heartburn.    . Coenzyme Q10 (COQ10 PO) Take 1 Dose by mouth at bedtime. Take 144m by mouth every night (liquid)    . diazepam (VALIUM) 5 MG tablet Take 1 tablet (5 mg total) by mouth daily as needed. 30 tablet 2  . furosemide (LASIX) 40 MG tablet Take 1 tablet (40 mg total) by mouth daily. 90 tablet 3  . KRILL OIL PO Take 1 tablet by mouth daily.    .Marland Kitchenlatanoprost (XALATAN) 0.005 % ophthalmic  solution Place 1 drop into both eyes at bedtime.     .Marland Kitchenlisinopril (PRINIVIL,ZESTRIL) 20 MG tablet Take 1 tablet (20 mg total) by mouth daily. 90 tablet 3  . Magnesium Oxide (MAG-OXIDE PO) Take 1 tablet by mouth at bedtime.     . metoprolol tartrate (LOPRESSOR) 100 MG tablet TAKE 1/2 TABLET (50 MG TOTAL) BY MOUTH EVERY MORNING. 45 tablet 3  . metoprolol tartrate (LOPRESSOR) 50 MG tablet TAKE 1/2 TABLET BY MOUTH EVERY EVENING. 45 tablet 3  . Multiple Vitamin (MULTIVITAMIN) tablet Take 1 tablet by mouth at bedtime.     . naphazoline-pheniramine (NAPHCON-A) 0.025-0.3 % ophthalmic solution Place 1 drop into both eyes daily as needed for irritation.    . nitroGLYCERIN (NITROSTAT) 0.4 MG SL tablet Place 1 tablet (0.4 mg total) under the tongue every 5 (five) minutes as needed for chest pain (for chest pain (MAX of 3 doses)). 25 tablet 6  . potassium chloride (K-DUR,KLOR-CON) 10 MEQ tablet Take 10 mEq by mouth 2 (two) times daily.      . Thiamine HCl (VITAMIN B-1) 100 MG tablet Take 100 mg by mouth daily.      .Alveda Reasons20 MG TABS tablet TAKE 1 TABLET BY MOUTH DAILY WITH SUPPER 30 tablet 5  . rosuvastatin (CRESTOR) 40 MG tablet Take 1 tablet (40 mg total) by mouth daily. 90 tablet 3   No current facility-administered medications for this visit.     Allergies  Allergen Reactions  . Cyclobenzaprine Hcl Other (See Comments)    Unspecified reaction pt doesn't know what reaction  . Tetracycline     Rash     Social History   Socioeconomic History  . Marital status: Married    Spouse name: Not on file  . Number of children: 3  . Years of education: Not on file  . Highest education level: Not on file  Occupational History  . Occupation: Retired  SScientific laboratory technician . Financial resource strain: Not hard at all  . Food insecurity:    Worry: Never true    Inability: Never true  . Transportation needs:    Medical: No    Non-medical: No  Tobacco Use  . Smoking status: Former Smoker    Last attempt  to quit: 10/25/1952    Years since quitting: 65.1  . Smokeless tobacco: Never Used  Substance and Sexual Activity  . Alcohol use: Yes    Alcohol/week: 5.0 - 6.0 standard drinks    Types: 5 - 6 Glasses of wine per week    Comment: 5-6 times a week  . Drug use: No  . Sexual activity: Never  Lifestyle  . Physical activity:    Days per week: Not on file    Minutes per session: Not on file  . Stress: Only a little  Relationships  . Social connections:    Talks on phone: More than three  times a week    Gets together: More than three times a week    Attends religious service: More than 4 times per year    Active member of club or organization: Not on file    Attends meetings of clubs or organizations: More than 4 times per year    Relationship status: Married  . Intimate partner violence:    Fear of current or ex partner: No    Emotionally abused: No    Physically abused: No    Forced sexual activity: No  Other Topics Concern  . Not on file  Social History Narrative  . Not on file    Family History  Problem Relation Age of Onset  . Heart attack Father 42  . Coronary artery disease Other        family history is dignificant for early CAD in several members    Review of Systems:  As stated in the HPI and otherwise negative.   BP 110/64   Pulse 68   Ht 5' 6.5" (1.689 m)   Wt 159 lb 6.4 oz (72.3 kg)   SpO2 99%   BMI 25.34 kg/m   Physical Examination:  General: Well developed, well nourished, NAD  HEENT: OP clear, mucus membranes moist  SKIN: warm, dry. No rashes. Neuro: No focal deficits  Musculoskeletal: Muscle strength 5/5 all ext  Psychiatric: Mood and affect normal  Neck: No JVD, no carotid bruits, no thyromegaly, no lymphadenopathy.  Lungs:Clear bilaterally, no wheezes, rhonci, crackles Cardiovascular: Regular rate and rhythm. No murmurs, gallops or rubs. Abdomen:Soft. Bowel sounds present. Non-tender.  Extremities: No lower extremity edema. Pulses are 2 + in  the bilateral DP/PT.  Echo January 2017: Left ventricle: Posterior basal hypokinesis The cavity size was   normal. Wall thickness was normal. Systolic function was normal.   The estimated ejection fraction was in the range of 50% to 55%. - Aortic valve: Post TAVR with 26 mm Sapien 3 valve No perivalvular   regurgitation. - Left atrium: The atrium was moderately dilated. - Atrial septum: No defect or patent foramen ovale was identified.  EKG:  EKG is not ordered today. The ekg ordered today demonstrates   Recent Labs: 03/21/2017: ALT 14 06/25/2017: BUN 27; Creatinine, Ser 1.02; Hemoglobin 13.3; Platelets 126; Potassium 4.5; Sodium 144   Lipid Panel    Component Value Date/Time   CHOL 125 03/21/2017 0814   TRIG 99 03/21/2017 0814   HDL 37 (L) 03/21/2017 0814   CHOLHDL 3.4 03/21/2017 0814   CHOLHDL 3.1 12/05/2015 1002   VLDL 18 12/05/2015 1002   LDLCALC 68 03/21/2017 0814     Wt Readings from Last 3 Encounters:  12/11/17 159 lb 6.4 oz (72.3 kg)  10/16/17 154 lb (69.9 kg)  07/05/17 158 lb (71.7 kg)     Other studies Reviewed: Additional studies/ records that were reviewed today include: . Review of the above records demonstrates:    Assessment and Plan:   1. Severe Aortic valve stenosis: He is s/p TAVR December 2015. His valve is working well by echo in April 2019. He is on Xarelto for atrial fib. Continue SBE prophylaxis prior to indicated procedures.    2. CAD s/p CABG without angina: He had CABG in 1982 and redo CABG in 1994. Last cath October 2015 prior to his TAVR with stable diffuse disease. He has no chest pain. Will continue beta blocker and statin. He is not on ASA since he is on Eliquis.    Will check  BMET in December 2019  3. Atrial fibrillation, persistent: He appears to be in sinus today. He is in atrial fibrillation today. Rate is controlled. Continue Toprol and Xarelto. Check CBC in December 2019.   4. History of cardiac arrest: ICD in place.  This is  followed by Dr. Caryl Comes  5. HLD: Lipids controlled. Continue statin. Will repeat lipids and LFTs in December 2019.   6. Obstructive sleep apnea: He is using CPAP now.    Current medicines are reviewed at length with the patient today.  The patient does not have concerns regarding medicines.  The following changes have been made:  no change  Labs/ tests ordered today include:   Orders Placed This Encounter  Procedures  . Comp Met (CMET)  . Lipid Profile  . CBC   Disposition:   FU with me in 6 months.      Signed, Lauree Chandler, MD 12/11/2017 11:16 AM    [facility] [address], [place], [place]  [address] Phone: [phone]; Fax: [phone]

## 2017-12-11 ENCOUNTER — Encounter: Payer: Self-pay | Admitting: Cardiovascular Disease

## 2017-12-11 ENCOUNTER — Ambulatory Visit (INDEPENDENT_AMBULATORY_CARE_PROVIDER_SITE_OTHER): Payer: Medicare Other | Admitting: Cardiovascular Disease

## 2017-12-11 VITALS — BP 110/64 | HR 68 | Ht 66.5 in | Wt 159.4 lb

## 2017-12-11 DIAGNOSIS — E7849 Other hyperlipidemia: Secondary | ICD-10-CM | POA: Diagnosis not present

## 2017-12-11 DIAGNOSIS — I359 Nonrheumatic aortic valve disorder, unspecified: Secondary | ICD-10-CM | POA: Diagnosis not present

## 2017-12-11 DIAGNOSIS — I4819 Other persistent atrial fibrillation: Secondary | ICD-10-CM

## 2017-12-11 DIAGNOSIS — I2510 Atherosclerotic heart disease of native coronary artery without angina pectoris: Secondary | ICD-10-CM

## 2017-12-11 DIAGNOSIS — I481 Persistent atrial fibrillation: Secondary | ICD-10-CM | POA: Diagnosis not present

## 2017-12-11 DIAGNOSIS — G4739 Other sleep apnea: Secondary | ICD-10-CM | POA: Diagnosis not present

## 2017-12-11 MED ORDER — AMOXICILLIN 500 MG PO TABS: ORAL_TABLET | ORAL | 4 refills | Status: DC

## 2017-12-11 NOTE — Patient Instructions (Signed)
Medication Instructions:  Your physician recommends that you continue on your current medications as directed. Please refer to the Current Medication list given to you today.   Labwork: To be done at appointment in December with Dossie Arbour will be fasting--CMET, CBC and Lipid  Testing/Procedures: none  Follow-Up: Please schedule follow up appointment in December with Tommye Standard.  Your physician recommends that you schedule a follow-up appointment in: 6 months with Dr. Angelena Form. Please call us in about 2-3 months to schedule this appointment    Any Other Special Instructions Will Be Listed Below (If Applicable).     If you need a refill on your cardiac medications before your next appointment, please call your pharmacy.

## 2017-12-16 ENCOUNTER — Telehealth: Payer: Self-pay | Admitting: *Deleted

## 2017-12-16 NOTE — Telephone Encounter (Signed)
-----   Message from Sueanne Margarita, MD sent at 12/12/2017  3:25 PM EDT ----- Good AHI and compliance.  Continue current PAP settings.

## 2017-12-16 NOTE — Telephone Encounter (Signed)
Informed patient of compliance results and patient understanding was verbalized. Patient is aware and agreeable to AHI being within range at 4.5. Patient is aware and agreeable to being in compliance with machine usage. Patient is aware and agreeable to no change in current pressures.

## 2017-12-23 ENCOUNTER — Other Ambulatory Visit: Payer: Self-pay | Admitting: Cardiovascular Disease

## 2018-01-02 ENCOUNTER — Ambulatory Visit (INDEPENDENT_AMBULATORY_CARE_PROVIDER_SITE_OTHER): Payer: Medicare Other | Admitting: *Deleted

## 2018-01-02 DIAGNOSIS — I255 Ischemic cardiomyopathy: Secondary | ICD-10-CM

## 2018-01-02 DIAGNOSIS — I469 Cardiac arrest, cause unspecified: Secondary | ICD-10-CM | POA: Diagnosis not present

## 2018-01-03 ENCOUNTER — Encounter: Payer: Self-pay | Admitting: Cardiology

## 2018-01-03 NOTE — Progress Notes (Signed)
Remote ICD transmission.   

## 2018-01-20 ENCOUNTER — Emergency Department (HOSPITAL_COMMUNITY): Payer: Medicare Other

## 2018-01-20 ENCOUNTER — Inpatient Hospital Stay (HOSPITAL_COMMUNITY)
Admission: EM | Admit: 2018-01-20 | Discharge: 2018-01-23 | DRG: 281 | Disposition: A | Discharge status: Home / Self Care | Payer: Medicare Other | Attending: Cardiology | Admitting: Cardiology

## 2018-01-20 ENCOUNTER — Other Ambulatory Visit: Payer: Self-pay

## 2018-01-20 ENCOUNTER — Encounter (HOSPITAL_COMMUNITY): Payer: Self-pay | Admitting: Cardiology

## 2018-01-20 DIAGNOSIS — I4892 Unspecified atrial flutter: Secondary | ICD-10-CM | POA: Diagnosis present

## 2018-01-20 DIAGNOSIS — I480 Paroxysmal atrial fibrillation: Secondary | ICD-10-CM | POA: Diagnosis present

## 2018-01-20 DIAGNOSIS — Z8674 Personal history of sudden cardiac arrest: Secondary | ICD-10-CM

## 2018-01-20 DIAGNOSIS — Z951 Presence of aortocoronary bypass graft: Secondary | ICD-10-CM | POA: Diagnosis not present

## 2018-01-20 DIAGNOSIS — Z8249 Family history of ischemic heart disease and other diseases of the circulatory system: Secondary | ICD-10-CM

## 2018-01-20 DIAGNOSIS — D649 Anemia, unspecified: Secondary | ICD-10-CM | POA: Diagnosis present

## 2018-01-20 DIAGNOSIS — Z881 Allergy status to other antibiotic agents status: Secondary | ICD-10-CM

## 2018-01-20 DIAGNOSIS — Z9049 Acquired absence of other specified parts of digestive tract: Secondary | ICD-10-CM

## 2018-01-20 DIAGNOSIS — I214 Non-ST elevation (NSTEMI) myocardial infarction: Secondary | ICD-10-CM | POA: Diagnosis present

## 2018-01-20 DIAGNOSIS — I340 Nonrheumatic mitral (valve) insufficiency: Secondary | ICD-10-CM | POA: Diagnosis not present

## 2018-01-20 DIAGNOSIS — I739 Peripheral vascular disease, unspecified: Secondary | ICD-10-CM | POA: Diagnosis present

## 2018-01-20 DIAGNOSIS — R000 Tachycardia, unspecified: Secondary | ICD-10-CM

## 2018-01-20 DIAGNOSIS — I5022 Chronic systolic (congestive) heart failure: Secondary | ICD-10-CM | POA: Diagnosis present

## 2018-01-20 DIAGNOSIS — Z888 Allergy status to other drugs, medicaments and biological substances status: Secondary | ICD-10-CM

## 2018-01-20 DIAGNOSIS — Z9089 Acquired absence of other organs: Secondary | ICD-10-CM

## 2018-01-20 DIAGNOSIS — I471 Supraventricular tachycardia: Secondary | ICD-10-CM | POA: Diagnosis present

## 2018-01-20 DIAGNOSIS — Z95810 Presence of automatic (implantable) cardiac defibrillator: Secondary | ICD-10-CM | POA: Diagnosis not present

## 2018-01-20 DIAGNOSIS — E7800 Pure hypercholesterolemia, unspecified: Secondary | ICD-10-CM | POA: Diagnosis present

## 2018-01-20 DIAGNOSIS — Z9842 Cataract extraction status, left eye: Secondary | ICD-10-CM | POA: Diagnosis not present

## 2018-01-20 DIAGNOSIS — Z79899 Other long term (current) drug therapy: Secondary | ICD-10-CM | POA: Diagnosis not present

## 2018-01-20 DIAGNOSIS — I25118 Atherosclerotic heart disease of native coronary artery with other forms of angina pectoris: Secondary | ICD-10-CM | POA: Diagnosis present

## 2018-01-20 DIAGNOSIS — Z87442 Personal history of urinary calculi: Secondary | ICD-10-CM | POA: Diagnosis not present

## 2018-01-20 DIAGNOSIS — M1990 Unspecified osteoarthritis, unspecified site: Secondary | ICD-10-CM | POA: Diagnosis present

## 2018-01-20 DIAGNOSIS — Z953 Presence of xenogenic heart valve: Secondary | ICD-10-CM | POA: Diagnosis not present

## 2018-01-20 DIAGNOSIS — I350 Nonrheumatic aortic (valve) stenosis: Secondary | ICD-10-CM | POA: Diagnosis present

## 2018-01-20 DIAGNOSIS — I5023 Acute on chronic systolic (congestive) heart failure: Secondary | ICD-10-CM | POA: Diagnosis present

## 2018-01-20 DIAGNOSIS — I110 Hypertensive heart disease with heart failure: Secondary | ICD-10-CM | POA: Diagnosis present

## 2018-01-20 DIAGNOSIS — I252 Old myocardial infarction: Secondary | ICD-10-CM

## 2018-01-20 DIAGNOSIS — E7849 Other hyperlipidemia: Secondary | ICD-10-CM | POA: Diagnosis present

## 2018-01-20 DIAGNOSIS — Z87891 Personal history of nicotine dependence: Secondary | ICD-10-CM

## 2018-01-20 DIAGNOSIS — Z9889 Other specified postprocedural states: Secondary | ICD-10-CM

## 2018-01-20 DIAGNOSIS — Z7901 Long term (current) use of anticoagulants: Secondary | ICD-10-CM

## 2018-01-20 DIAGNOSIS — I4719 Other supraventricular tachycardia: Secondary | ICD-10-CM | POA: Diagnosis present

## 2018-01-20 DIAGNOSIS — M542 Cervicalgia: Secondary | ICD-10-CM | POA: Diagnosis not present

## 2018-01-20 DIAGNOSIS — Z9841 Cataract extraction status, right eye: Secondary | ICD-10-CM

## 2018-01-20 HISTORY — DX: Non-ST elevation (NSTEMI) myocardial infarction: I21.4

## 2018-01-20 HISTORY — DX: Other specified postprocedural states: Z98.890

## 2018-01-20 HISTORY — DX: Chronic systolic (congestive) heart failure: I50.22

## 2018-01-20 LAB — COMPREHENSIVE METABOLIC PANEL
ALK PHOS: 48 U/L (ref 38–126)
ALT: 14 U/L (ref 0–44)
ANION GAP: 7 (ref 5–15)
AST: 22 U/L (ref 15–41)
Albumin: 4 g/dL (ref 3.5–5.0)
BUN: 43 mg/dL — ABNORMAL HIGH (ref 8–23)
CALCIUM: 10.1 mg/dL (ref 8.9–10.3)
CO2: 22 mmol/L (ref 22–32)
Chloride: 111 mmol/L (ref 98–111)
Creatinine, Ser: 1.92 mg/dL — ABNORMAL HIGH (ref 0.61–1.24)
GFR calc Af Amer: 36 mL/min — ABNORMAL LOW (ref >60)
GFR calc non Af Amer: 31 mL/min — ABNORMAL LOW (ref >60)
Glucose, Bld: 146 mg/dL — ABNORMAL HIGH (ref 70–99)
Potassium: 4.1 mmol/L (ref 3.5–5.1)
SODIUM: 140 mmol/L (ref 135–145)
Total Bilirubin: 0.5 mg/dL (ref 0.3–1.2)
Total Protein: 7 g/dL (ref 6.5–8.1)

## 2018-01-20 LAB — CBC
HCT: 34.8 % — ABNORMAL LOW (ref 39.0–52.0)
HEMOGLOBIN: 10.5 g/dL — AB (ref 13.0–17.0)
MCH: 27.5 pg (ref 26.0–34.0)
MCHC: 30.2 g/dL (ref 30.0–36.0)
MCV: 91.1 fL (ref 80.0–100.0)
NRBC: 0 % (ref 0.0–0.2)
Platelets: 159 10*3/uL (ref 150–400)
RBC: 3.82 MIL/uL — AB (ref 4.22–5.81)
RDW: 13.7 % (ref 11.5–15.5)
WBC: 7.6 10*3/uL (ref 4.0–10.5)

## 2018-01-20 LAB — BRAIN NATRIURETIC PEPTIDE: B Natriuretic Peptide: 951.6 pg/mL — ABNORMAL HIGH (ref 0.0–100.0)

## 2018-01-20 LAB — I-STAT TROPONIN, ED: Troponin i, poc: 0 ng/mL (ref 0.00–0.08)

## 2018-01-20 LAB — MAGNESIUM: Magnesium: 2.5 mg/dL — ABNORMAL HIGH (ref 1.7–2.4)

## 2018-01-20 MED ORDER — AMIODARONE HCL IN DEXTROSE 360-4.14 MG/200ML-% IV SOLN
30.0000 mg/h | INTRAVENOUS | Status: DC
  Administered 2018-01-21 – 2018-01-23 (×3): 30 mg/h via INTRAVENOUS
  Filled 2018-01-20 (×6): qty 200

## 2018-01-20 MED ORDER — CALCIUM CARBONATE ANTACID 500 MG PO CHEW: 2.0000 | CHEWABLE_TABLET | Freq: Every day | ORAL | Status: DC | PRN

## 2018-01-20 MED ORDER — DIAZEPAM 5 MG PO TABS
5.0000 mg | ORAL_TABLET | Freq: Two times a day (BID) | ORAL | Status: DC | PRN
  Administered 2018-01-21 – 2018-01-23 (×2): 5 mg via ORAL
  Filled 2018-01-20 (×2): qty 1

## 2018-01-20 MED ORDER — ONDANSETRON HCL 4 MG/2ML IJ SOLN: 4.0000 mg | Freq: Four times a day (QID) | INTRAMUSCULAR | Status: DC | PRN

## 2018-01-20 MED ORDER — LATANOPROST 0.005 % OP SOLN
1.0000 [drp] | Freq: Every day | OPHTHALMIC | Status: DC
  Administered 2018-01-21 – 2018-01-22 (×3): 1 [drp] via OPHTHALMIC
  Filled 2018-01-20: qty 2.5

## 2018-01-20 MED ORDER — POTASSIUM CHLORIDE CRYS ER 10 MEQ PO TBCR
10.0000 meq | EXTENDED_RELEASE_TABLET | Freq: Two times a day (BID) | ORAL | Status: DC
  Administered 2018-01-20 – 2018-01-23 (×6): 10 meq via ORAL
  Filled 2018-01-20 (×6): qty 1

## 2018-01-20 MED ORDER — AMIODARONE LOAD VIA INFUSION
150.0000 mg | Freq: Once | INTRAVENOUS | Status: AC
  Administered 2018-01-20: 150 mg via INTRAVENOUS
  Filled 2018-01-20: qty 83.34

## 2018-01-20 MED ORDER — AMIODARONE HCL IN DEXTROSE 360-4.14 MG/200ML-% IV SOLN
60.0000 mg/h | INTRAVENOUS | Status: DC
  Administered 2018-01-20 – 2018-01-21 (×2): 60 mg/h via INTRAVENOUS
  Filled 2018-01-20 (×2): qty 200

## 2018-01-20 MED ORDER — VITAMIN B-1 100 MG PO TABS
100.0000 mg | ORAL_TABLET | Freq: Every day | ORAL | Status: DC
  Administered 2018-01-21 – 2018-01-23 (×4): 100 mg via ORAL
  Filled 2018-01-20 (×3): qty 1

## 2018-01-20 MED ORDER — NAPHAZOLINE-PHENIRAMINE 0.025-0.3 % OP SOLN: 1.0000 [drp] | Freq: Every day | OPHTHALMIC | Status: DC | PRN

## 2018-01-20 MED ORDER — ACETAMINOPHEN 325 MG PO TABS: 650.0000 mg | ORAL_TABLET | ORAL | Status: DC | PRN

## 2018-01-20 MED ORDER — ACETAMINOPHEN 325 MG PO TABS: 650.0000 mg | ORAL_TABLET | Freq: Every day | ORAL | Status: DC | PRN

## 2018-01-20 MED ORDER — RIVAROXABAN 20 MG PO TABS
20.0000 mg | ORAL_TABLET | Freq: Every day | ORAL | Status: DC
  Administered 2018-01-20: 20 mg via ORAL
  Filled 2018-01-20: qty 1

## 2018-01-20 MED ORDER — NITROGLYCERIN 0.4 MG SL SUBL: 0.4000 mg | SUBLINGUAL_TABLET | SUBLINGUAL | Status: DC | PRN

## 2018-01-20 MED ORDER — METOPROLOL TARTRATE 25 MG PO TABS: 25.0000 mg | ORAL_TABLET | Freq: Every evening | ORAL | Status: DC

## 2018-01-20 MED ORDER — MAGNESIUM OXIDE 400 (241.3 MG) MG PO TABS
200.0000 mg | ORAL_TABLET | Freq: Every day | ORAL | Status: DC
  Administered 2018-01-20 – 2018-01-22 (×3): 200 mg via ORAL
  Filled 2018-01-20 (×3): qty 1

## 2018-01-20 MED ORDER — ROSUVASTATIN CALCIUM 20 MG PO TABS
40.0000 mg | ORAL_TABLET | Freq: Every day | ORAL | Status: DC
  Administered 2018-01-21 – 2018-01-23 (×3): 40 mg via ORAL
  Filled 2018-01-20 (×3): qty 2

## 2018-01-20 MED ORDER — FUROSEMIDE 40 MG PO TABS
40.0000 mg | ORAL_TABLET | Freq: Every day | ORAL | Status: DC
  Administered 2018-01-21 – 2018-01-23 (×3): 40 mg via ORAL
  Filled 2018-01-20 (×3): qty 1

## 2018-01-20 NOTE — ED Triage Notes (Signed)
Pt reports he woke up today with neck tightness, reports his fitbit said his HR was 127. Pt has defibrillator and hx of MI and cardiac arrest. Pt reports he had the same symptoms with previous MI. Pt denies SHOB, dizziness, N/V. Pt reports he felt, "spacey" prior to napping.

## 2018-01-20 NOTE — Progress Notes (Signed)
Discussed results of ICD interrogation with [employer] rep.  Appears to be tracking tin the atrium at 130bpm with V pacing and likely atrial flutter vs atrial tach with V pacing.  Will start IV Amio gtt.  Also, optivol was trending up but dose not appear volume overloaded on exam.  Continue current dose of Lasix and check BNP.

## 2018-01-20 NOTE — ED Provider Notes (Signed)
[facility] EMERGENCY DEPARTMENT Provider Note   CSN: [id number] Arrival date & time: 01/20/18  1721     History   Chief Complaint Chief Complaint  Patient presents with  . Neck Pain    HPI [name] is a 84 y.o. male.  HPI   Patient is a 84yo male with PMHx of Afib (Xarelto), AS s/p TAVR, CAD s/p CABG (1982, 1994), and out of hospital cardiac arrest s/p Medtronic ICD who presents with constant neck tightness that began upon waking from his nap around 1630 this afternoon.  Patient was concerned as previous MI begins with neck tightness.  His fitbit also told him his HR was in the 120s.  He currently denies CP, SOB, lightheadedness, N/V/D, abdominal pain, and all other complaints. Patient was otherwise doing well earlier today.  He has been compliant with Xarelto.  Past Medical History:  Diagnosis Date  . Aortic stenosis, severe    s/p TAVR with Edwards Sapien 3 THV (size 26 mm, model # 9600TFX, serial # [id number])  . Arthritis   . Cardiac arrest ([place])   . Claudication ([place])   . Coronary artery disease   . Essential hypertension   . Headache   . History of hiatal hernia   . Hyperlipidemia   . Kidney stones   . Peripheral vascular disease [facility])     Patient Active Problem List   Diagnosis Date Noted  . Atrial flutter with rapid ventricular response ([place]) 01/20/2018  . Acute upper respiratory infection 03/02/2016  . Acute pharyngitis 08/25/2015  . Sleep difficulties 07/29/2015  . Erectile disorder due to medical condition in male patient 12/17/2014  . Severe aortic valve stenosis - s/p  Edwards Sapien 3 THV (size 26 mm, model # 9600TFX, serial # [id number]) 03/23/2014 03/23/2014  . Abnormal nuclear stress test 01/29/2014  . Ischemic cardiomyopathy 12/27/11  . Sudden cardiac death-aborted 2010/10/20  . Back pain 07/10/2010  . Hypercholesteremia 07/10/2010  . Benign hypertensive heart disease without heart failure 07/10/2010  . S/P CABG (coronary  artery bypass graft) 07/10/2010  . Implantable cardioverter-defibrillator (ICD) in situ 03/30/2010  . Atrial fibrillation ([place]) 01/28/2010    Past Surgical History:  Procedure Laterality Date  . ADENOIDECTOMY    . APPENDECTOMY    . CARDIAC CATHETERIZATION   11/16/2009   . CARDIAC DEFIBRILLATOR PLACEMENT    . cataract surgery      Bilateral cataract surgery  . CORONARY ARTERY BYPASS GRAFT     in 1982 and '84 with subsequent PTCI  . EP IMPLANTABLE DEVICE N/A 03/21/2016   Procedure: ICD Generator Changeout;  Surgeon: Deboraha Sprang, MD;  Location: [facility] CV LAB;  Service: Cardiovascular;  Laterality: N/A;  . HERNIA REPAIR    . LEFT AND RIGHT HEART CATHETERIZATION WITH CORONARY/GRAFT ANGIOGRAM N/A 02/01/2014   Procedure: LEFT AND RIGHT HEART CATHETERIZATION WITH Beatrix Fetters;  Surgeon: Sinclair Grooms, MD;  Location: [facility] CATH LAB;  Service: Cardiovascular;  Laterality: N/A;  . left elbow tendon surgery    . TEE WITHOUT CARDIOVERSION N/A 03/23/2014   Procedure: TRANSESOPHAGEAL ECHOCARDIOGRAM (TEE);  Surgeon: Burnell Blanks, MD;  Location: [facility];  Service: Open Heart Surgery;  Laterality: N/A;  . TONSILLECTOMY    . TRANSCATHETER AORTIC VALVE REPLACEMENT, TRANSFEMORAL N/A 03/23/2014   Procedure: TRANSCATHETER AORTIC VALVE REPLACEMENT, TRANSFEMORAL;  Surgeon: Burnell Blanks, MD;  Location: [facility];  Service: Open Heart Surgery;  Laterality: N/A;        Home Medications  Prior to Admission medications   Medication Sig Start Date End Date Taking? Authorizing Provider  amLODipine (NORVASC) 5 MG tablet TAKE 1 TABLET BY MOUTH DAILY. Patient taking differently: Take 5 mg by mouth daily.  12/23/17  Yes Burnell Blanks, MD  amoxicillin (AMOXIL) 500 MG tablet Take 4 tablets by mouth 60 minutes prior to dental appointments Patient taking differently: Take 2,000 mg by mouth See admin instructions. Take 4 tablets (2000 mg) by mouth prior to dental procedures  12/11/17  Yes Burnell Blanks, MD  Calcium Carbonate Antacid (TUMS PO) Take 2 tablets by mouth 2 (two) times daily as needed (indigestion/ acid reflux).   Yes [provider]  Coenzyme Q10-Vitamin E (QUNOL ULTRA COQ10 PO) Take 10-15 mLs by mouth at bedtime.   Yes [provider]  diazepam (VALIUM) 5 MG tablet Take 1 tablet (5 mg total) by mouth daily as needed. Patient taking differently: Take 5 mg by mouth at bedtime as needed (sleep).  11/25/17  Yes Burnell Blanks, MD  furosemide (LASIX) 40 MG tablet Take 1 tablet (40 mg total) by mouth daily. 07/30/17  Yes Burnell Blanks, MD  ketotifen (ZADITOR) 0.025 % ophthalmic solution Place 1 drop into both eyes daily as needed (seasonal allergies).   Yes [provider]  KRILL OIL PO Take 1 capsule by mouth at bedtime.    Yes [provider]  latanoprost (XALATAN) 0.005 % ophthalmic solution Place 1 drop into both eyes at bedtime.  05/28/11  Yes [provider]  lisinopril (PRINIVIL,ZESTRIL) 20 MG tablet Take 1 tablet (20 mg total) by mouth daily. 07/30/17  Yes Burnell Blanks, MD  Magnesium Oxide (MAG-OXIDE PO) Take 1 tablet by mouth at bedtime.    Yes [provider]  Melatonin 1 MG TABS Take 2-3 mg by mouth at bedtime as needed (sleep).   Yes [provider]  metoprolol tartrate (LOPRESSOR) 100 MG tablet Take 50 mg by mouth daily.   Yes [provider]  metoprolol tartrate (LOPRESSOR) 50 MG tablet TAKE 1/2 TABLET BY MOUTH EVERY EVENING. Patient taking differently: Take 25 mg by mouth at bedtime.  02/11/17  Yes Burnell Blanks, MD  Multiple Vitamin (MULTIVITAMIN WITH MINERALS) TABS tablet Take 1 tablet by mouth daily.   Yes [provider]  nitroGLYCERIN (NITROSTAT) 0.4 MG SL tablet Place 1 tablet (0.4 mg total) under the tongue every 5 (five) minutes as needed for chest pain (for chest pain (MAX of 3 doses)). 04/12/15  Yes Burnell Blanks, MD  Potassium 99 MG TABS Take 99 mg by mouth 2 (two) times daily.   Yes [provider]  PRESCRIPTION MEDICATION Inhale into the lungs at bedtime. CPAP   Yes [provider]  Propylene Glycol (SYSTANE COMPLETE) 0.6 % SOLN Place 1 drop into both eyes daily as needed (dry eyes).   Yes [provider]  rosuvastatin (CRESTOR) 40 MG tablet TAKE 1 TABLET BY MOUTH DAILY. Patient taking differently: Take 40 mg by mouth at bedtime.  12/23/17  Yes Burnell Blanks, MD  Thiamine HCl (VITAMIN B-1) 100 MG tablet Take 100 mg by mouth at bedtime.    Yes [provider]  trospium (SANCTURA) 20 MG tablet Take 20 mg by mouth 2 (two) times daily.   Yes [provider]  XARELTO 20 MG TABS tablet TAKE 1 TABLET BY MOUTH DAILY WITH SUPPER Patient taking differently: Take 20 mg by mouth at bedtime.  09/03/17  Yes Burnell Blanks,  MD    Family History Family History  Problem Relation Age of Onset  . Heart attack Father 24  . Coronary artery disease Other        family history is dignificant for early CAD in several members    Social History Social History   Tobacco Use  . Smoking status: Former Smoker    Last attempt to quit: 10/25/1952    Years since quitting: 65.2  . Smokeless tobacco: Never Used  Substance Use Topics  . Alcohol use: Yes    Alcohol/week: 5.0 - 6.0 standard drinks    Types: 5 - 6 Glasses of wine per week    Comment: 5-6 times a week  . Drug use: No     Allergies   Cyclobenzaprine hcl and Tetracycline   Review of Systems Review of Systems  Constitutional: Negative for chills and fever.  HENT: Negative for sore throat.        +throat pain  Eyes: Negative for pain and visual disturbance.  Respiratory: Negative for cough and shortness of breath.   Cardiovascular: Negative for chest pain and palpitations.  Gastrointestinal: Negative for abdominal pain, diarrhea, nausea and vomiting.  Genitourinary: Negative  for dysuria and hematuria.  Musculoskeletal: Negative for arthralgias and back pain.  Skin: Negative for color change and rash.  Neurological: Negative for seizures and syncope.  All other systems reviewed and are negative.    Physical Exam Updated Vital Signs BP 100/66 (BP Location: Right Arm)   Pulse 64   Temp 98.7 F (37.1 C) (Oral)   Resp 20   Ht 5' 6.5" (1.689 m)   Wt 70.4 kg   SpO2 94%   BMI 24.68 kg/m   Physical Exam  Constitutional: He appears well-developed and well-nourished. No distress.  HENT:  Head: Normocephalic and atraumatic.  Mouth/Throat: Oropharynx is clear and moist.  Eyes: Pupils are equal, round, and reactive to light. Conjunctivae and EOM are normal.  Neck: Normal range of motion. Neck supple.  Cardiovascular: Regular rhythm and intact distal pulses. Tachycardia present.  No murmur heard. Pulmonary/Chest: Effort normal and breath sounds normal. No respiratory distress.  Abdominal: Soft. He exhibits no distension. There is no tenderness. There is no guarding.  Musculoskeletal: Normal range of motion.  Neurological: He is alert.  Skin: Skin is warm and dry. Capillary refill takes less than 2 seconds.  Psychiatric: He has a normal mood and affect.  Nursing note and vitals reviewed.    ED Treatments / Results  Labs (all labs ordered are listed, but only abnormal results are displayed) Labs Reviewed  CBC - Abnormal; Notable for the following components:      Result Value   RBC 3.82 (*)    Hemoglobin 10.5 (*)    HCT 34.8 (*)    All other components within normal limits  COMPREHENSIVE METABOLIC PANEL - Abnormal; Notable for the following components:   Glucose, Bld 146 (*)    BUN 43 (*)    Creatinine, Ser 1.92 (*)    GFR calc non Af Amer 31 (*)    GFR calc Af Amer 36 (*)    All other components within normal limits  MAGNESIUM - Abnormal; Notable for the following components:   Magnesium 2.5 (*)    All other components within normal limits    BRAIN NATRIURETIC PEPTIDE - Abnormal; Notable for the following components:   B Natriuretic Peptide 951.6 (*)    All other components within normal limits  TROPONIN I - Abnormal; Notable  for the following components:   Troponin I 1.25 (*)    All other components within normal limits  TROPONIN I - Abnormal; Notable for the following components:   Troponin I 5.34 (*)    All other components within normal limits  TROPONIN I - Abnormal; Notable for the following components:   Troponin I 15.36 (*)    All other components within normal limits  HEMOGLOBIN A1C - Abnormal; Notable for the following components:   Hgb A1c MFr Bld 6.2 (*)    All other components within normal limits  PROTIME-INR - Abnormal; Notable for the following components:   Prothrombin Time 15.8 (*)    All other components within normal limits  BASIC METABOLIC PANEL - Abnormal; Notable for the following components:   Chloride 112 (*)    CO2 21 (*)    Glucose, Bld 138 (*)    BUN 38 (*)    Creatinine, Ser 1.85 (*)    GFR calc non Af Amer 33 (*)    GFR calc Af Amer 38 (*)    All other components within normal limits  LIPID PANEL - Abnormal; Notable for the following components:   Triglycerides 209 (*)    HDL 30 (*)    VLDL 42 (*)    All other components within normal limits  CBC - Abnormal; Notable for the following components:   RBC 3.20 (*)    Hemoglobin 8.9 (*)    HCT 28.6 (*)    All other components within normal limits  MRSA PCR SCREENING  MAGNESIUM  HEPATIC FUNCTION PANEL  TSH  T4, FREE  CBG MONITORING, ED  I-STAT TROPONIN, ED  I-STAT TROPONIN, ED    EKG EKG Interpretation  Date/Time:  Monday January 20 2018 17:29:42 EDT Ventricular Rate:  192 PR Interval:    QRS Duration: 72 QT Interval:  68 QTC Calculation: 121 R Axis:   -69 Text Interpretation:  Demand pacemaker; interpretation is based on intrinsic rhythm Atrial fibrillation with rapid ventricular response with premature ventricular or  aberrantly conducted complexes Left axis deviation Low voltage QRS Inferior infarct , age undetermined Anterolateral infarct , age undetermined Abnormal ECG Confirmed by Fredia Sorrow [phone]) on 01/20/2018 5:52:15 PM   Radiology Dg Chest Portable 1 View  Result Date: 01/20/2018 CLINICAL DATA:  84 year old male with a history of neck tightness EXAM: PORTABLE CHEST 1 VIEW COMPARISON:  03/13/2017 FINDINGS: Cardiomediastinal silhouette unchanged in size and contour. No evidence of central vascular congestion. No pneumothorax or pleural effusion. Surgical changes of prior median sternotomy and CABG. Surgical changes of prior trans catheter aortic valve replacement. Cardiac pacing device/AICD unchanged. No confluent airspace disease. IMPRESSION: Chronic lung changes without evidence of acute cardiopulmonary disease. Surgical changes of prior median sternotomy and CABG, TAVR, and cardiac AICD Electronically Signed   By: Corrie Mckusick D.O.   On: 01/20/2018 18:17    Procedures Procedures (including critical care time)  Medications Ordered in ED Medications  furosemide (LASIX) tablet 40 mg (40 mg Oral Given 01/21/18 1010)  nitroGLYCERIN (NITROSTAT) SL tablet 0.4 mg (has no administration in time range)  diazepam (VALIUM) tablet 5 mg (has no administration in time range)  rosuvastatin (CRESTOR) tablet 40 mg (40 mg Oral Given 01/21/18 1011)  calcium carbonate (TUMS - dosed in mg elemental calcium) chewable tablet 400 mg of elemental calcium (has no administration in time range)  magnesium oxide (MAG-OX) tablet 200 mg (200 mg Oral Given 01/20/18 2350)  potassium chloride (K-DUR,KLOR-CON) CR tablet 10 mEq (10 mEq  Oral Given 01/21/18 1012)  thiamine (VITAMIN B-1) tablet 100 mg (100 mg Oral Given 01/21/18 1011)  latanoprost (XALATAN) 0.005 % ophthalmic solution 1 drop (1 drop Both Eyes Given 01/21/18 0017)  naphazoline-pheniramine (NAPHCON-A) 0.025-0.3 % ophthalmic solution 1 drop (has no administration  in time range)  acetaminophen (TYLENOL) tablet 650 mg (has no administration in time range)  ondansetron (ZOFRAN) injection 4 mg (has no administration in time range)  amiodarone (NEXTERONE) 1.8 mg/mL load via infusion 150 mg (150 mg Intravenous Bolus from Bag 01/20/18 2208)    Followed by  amiodarone (NEXTERONE PREMIX) 360-4.14 MG/200ML-% (1.8 mg/mL) IV infusion (0 mg/hr Intravenous Stopped 01/21/18 0431)    Followed by  amiodarone (NEXTERONE PREMIX) 360-4.14 MG/200ML-% (1.8 mg/mL) IV infusion (30 mg/hr Intravenous New Bag/Given 01/21/18 1017)  metoprolol tartrate (LOPRESSOR) tablet 25 mg (25 mg Oral Not Given 01/21/18 1000)  Rivaroxaban (XARELTO) tablet 15 mg (has no administration in time range)     Initial Impression / Assessment and Plan / ED Course  I have reviewed the triage vital signs and the nursing notes.  Pertinent labs & imaging results that were available during my care of the patient were reviewed by me and considered in my medical decision making (see chart for details).     Patient is a 84yo male with PMHx of Afib (Xarelto), AS s/p TAVR, CAD s/p CABG (1982, 1994), and out of hospital cardiac arrest s/p Medtronic ICD who presents with constant neck tightness that began upon waking from a nap at 1630 today which is his anginal equivalent.  On arrival he is tachycardic to 120s with slightly soft BP as 100/80s.  Mentating appropriately.  Medtronic device interrogation pending however suspect afib/aflutter with ventricular pacing.  Cardiology consulted who will admit and start amiodarone.  Initial troponin negative, delta pending.  Labs with AKI likely prerenal as BUN 43 and Cr 1.92 (baseline 1.15).  No gross electrolyte abnormalities.  CXR with chronic lung changes and no evidence of acute cardiopulmonary process.    Patient transported to inpatient bed. HDS.     Final Clinical Impressions(s) / ED Diagnoses   Final diagnoses:  Tachycardia    ED Discharge Orders    None         Fabian November, MD 01/21/18 1312    Fredia Sorrow, MD 01/24/18 2151

## 2018-01-20 NOTE — ED Notes (Signed)
Medtronic device interrogated waited for results

## 2018-01-20 NOTE — ED Notes (Signed)
Dr. Radford Pax at bedside at this time

## 2018-01-20 NOTE — H&P (Signed)
Cardiology Admission History and Physical:   Patient ID: [name] MRN: [id number]; DOB: January 15, 1934   Admission date: 01/20/2018  Primary Care Provider: Binnie Rail, MD Primary Cardiologist: Lauree Chandler, MD  Primary Electrophysiologist:  Virl Axe, MD   Chief Complaint:  Throat tightness - anginal equivalent and tachycardia.   Patient Profile:   [name] is a 84 y.o. male with Hx of CABG in 1982 and redo in 1994,  ICM and out of hospital cardiac arrest with successful resuscitation and MDT ICD.  And PAF Then AS and TAVR 2015.     History of Present Illness:   [name] hx as above and Cardiac cath October 2015 with total occlusion of left main and RCA. Patent LIMA to LAD, patent SVG to OM, patent SVG to RCA. Of note, diffuse disease in LAD both before and after LIMA graft insertion. Echo April 2019 with PYKD=98-33%, grade 2 diastolic dysfunction. The bioprosthetic AVR is working well with trivial perivalvular leak, mean gradient 14 mmHg (10 mmHg by echo in 2017).  EF in 2017 was 50-55%.     Today he had been in usual health then this afternoon he developed throat tightness, his anginal equivalent.  His fit bit noted HR 127 so he came to ER.   HR is 128, a flutter   He is on xarelto.  And toprol.  EKG a flutter with paced V beats.  I personally reviewed.   Na 140, K+ 4.1, BUN 43, Cr 1.92, Mg+ 2.5 Troponin 0.00 Hgb 10.5, Hct 34.8, plts 159  CXR Chronic lung changes without evidence of acute cardiopulmonary disease. Surgical changes of prior median sternotomy and CABG, TAVR, and cardiac AICD  Throat tightness has resolved.   Past Medical History:  Diagnosis Date  . Aortic stenosis, severe    s/p TAVR with Edwards Sapien 3 THV (size 26 mm, model # 9600TFX, serial # [id number])  . Arthritis   . Cardiac arrest ([place])   . Claudication ([place])   . Coronary artery disease   . Essential hypertension   . Headache   . History of hiatal hernia   .  Hyperlipidemia   . Kidney stones   . Peripheral vascular disease [facility])     Past Surgical History:  Procedure Laterality Date  . ADENOIDECTOMY    . APPENDECTOMY    . CARDIAC CATHETERIZATION   11/16/2009   . CARDIAC DEFIBRILLATOR PLACEMENT    . cataract surgery      Bilateral cataract surgery  . CORONARY ARTERY BYPASS GRAFT     in 1982 and '84 with subsequent PTCI  . EP IMPLANTABLE DEVICE N/A 03/21/2016   Procedure: ICD Generator Changeout;  Surgeon: Deboraha Sprang, MD;  Location: [place] CV LAB;  Service: Cardiovascular;  Laterality: N/A;  . HERNIA REPAIR    . LEFT AND RIGHT HEART CATHETERIZATION WITH CORONARY/GRAFT ANGIOGRAM N/A 02/01/2014   Procedure: LEFT AND RIGHT HEART CATHETERIZATION WITH Beatrix Fetters;  Surgeon: Sinclair Grooms, MD;  Location: [facility] CATH LAB;  Service: Cardiovascular;  Laterality: N/A;  . left elbow tendon surgery    . TEE WITHOUT CARDIOVERSION N/A 03/23/2014   Procedure: TRANSESOPHAGEAL ECHOCARDIOGRAM (TEE);  Surgeon: Burnell Blanks, MD;  Location: [place];  Service: Open Heart Surgery;  Laterality: N/A;  . TONSILLECTOMY    . TRANSCATHETER AORTIC VALVE REPLACEMENT, TRANSFEMORAL N/A 03/23/2014   Procedure: TRANSCATHETER AORTIC VALVE REPLACEMENT, TRANSFEMORAL;  Surgeon: Burnell Blanks, MD;  Location: [place];  Service: Open Heart Surgery;  Laterality: N/A;     Medications Prior to Admission: Prior to Admission medications   Medication Sig Start Date End Date Taking? Authorizing Provider  acetaminophen (TYLENOL) 325 MG tablet Take 650 mg by mouth daily as needed for moderate pain or headache.    [provider]  amLODipine (NORVASC) 5 MG tablet TAKE 1 TABLET BY MOUTH DAILY. 12/23/17   Burnell Blanks, MD  amoxicillin (AMOXIL) 500 MG tablet Take 4 tablets by mouth 60 minutes prior to dental appointments 12/11/17   Burnell Blanks, MD  calcium carbonate (TUMS - DOSED IN MG ELEMENTAL CALCIUM) 500 MG chewable tablet  Chew 2 tablets by mouth daily as needed for indigestion or heartburn.    [provider]  Coenzyme Q10 (COQ10 PO) Take 1 Dose by mouth at bedtime. Take 150mg  by mouth every night (liquid)    [provider]  diazepam (VALIUM) 5 MG tablet Take 1 tablet (5 mg total) by mouth daily as needed. 11/25/17   Burnell Blanks, MD  furosemide (LASIX) 40 MG tablet Take 1 tablet (40 mg total) by mouth daily. 07/30/17   Burnell Blanks, MD  KRILL OIL PO Take 1 tablet by mouth daily.    [provider]  latanoprost (XALATAN) 0.005 % ophthalmic solution Place 1 drop into both eyes at bedtime.  05/28/11   [provider]  lisinopril (PRINIVIL,ZESTRIL) 20 MG tablet Take 1 tablet (20 mg total) by mouth daily. 07/30/17   Burnell Blanks, MD  Magnesium Oxide (MAG-OXIDE PO) Take 1 tablet by mouth at bedtime.     [provider]  metoprolol tartrate (LOPRESSOR) 100 MG tablet TAKE 1/2 TABLET (50 MG TOTAL) BY MOUTH EVERY MORNING. 08/12/17   Burnell Blanks, MD  metoprolol tartrate (LOPRESSOR) 50 MG tablet TAKE 1/2 TABLET BY MOUTH EVERY EVENING. 02/11/17   Burnell Blanks, MD  Multiple Vitamin (MULTIVITAMIN) tablet Take 1 tablet by mouth at bedtime.     [provider]  naphazoline-pheniramine (NAPHCON-A) 0.025-0.3 % ophthalmic solution Place 1 drop into both eyes daily as needed for irritation.    [provider]  nitroGLYCERIN (NITROSTAT) 0.4 MG SL tablet Place 1 tablet (0.4 mg total) under the tongue every 5 (five) minutes as needed for chest pain (for chest pain (MAX of 3 doses)). 04/12/15   Burnell Blanks, MD  potassium chloride (K-DUR,KLOR-CON) 10 MEQ tablet Take 10 mEq by mouth 2 (two) times daily.      [provider]  rosuvastatin (CRESTOR) 40 MG tablet TAKE 1 TABLET BY MOUTH DAILY. 12/23/17   Burnell Blanks, MD  Thiamine HCl (VITAMIN B-1) 100 MG tablet Take 100 mg by mouth daily.      [provider]  XARELTO 20 MG TABS tablet TAKE 1 TABLET BY MOUTH DAILY WITH SUPPER 09/03/17   Burnell Blanks, MD     Allergies:    Allergies  Allergen Reactions  . Cyclobenzaprine Hcl Other (See Comments)    Unspecified reaction pt doesn't know what reaction  . Tetracycline     Rash     Social History:   Social History   Socioeconomic History  . Marital status: Married    Spouse name: Not on file  . Number of children: 3  . Years of education: Not on file  . Highest education level: Not on file  Occupational History  . Occupation: Retired  Scientific laboratory technician  . Financial resource strain: Not hard at all  . Food insecurity:  Worry: Never true    Inability: Never true  . Transportation needs:    Medical: No    Non-medical: No  Tobacco Use  . Smoking status: Former Smoker    Last attempt to quit: 10/25/1952    Years since quitting: 65.2  . Smokeless tobacco: Never Used  Substance and Sexual Activity  . Alcohol use: Yes    Alcohol/week: 5.0 - 6.0 standard drinks    Types: 5 - 6 Glasses of wine per week    Comment: 5-6 times a week  . Drug use: No  . Sexual activity: Never  Lifestyle  . Physical activity:    Days per week: Not on file    Minutes per session: Not on file  . Stress: Only a little  Relationships  . Social connections:    Talks on phone: More than three times a week    Gets together: More than three times a week    Attends religious service: More than 4 times per year    Active member of club or organization: Not on file    Attends meetings of clubs or organizations: More than 4 times per year    Relationship status: Married  . Intimate partner violence:    Fear of current or ex partner: No    Emotionally abused: No    Physically abused: No    Forced sexual activity: No  Other Topics Concern  . Not on file  Social History Narrative  . Not on file    Family History:   The patient's family history includes Coronary artery disease in his  other; Heart attack (age of onset: 29) in his father.    ROS:  Please see the history of present illness.  General:no colds or fevers, no weight changes Skin:no rashes or ulcers HEENT:no blurred vision, no congestion CV:see HPI PUL:see HPI GI:no diarrhea constipation or melena, no indigestion GU:no hematuria, no dysuria MS:no joint pain, no claudication Neuro:no syncope, no lightheadedness Endo:no diabetes, no thyroid disease      Physical Exam/Data:   Vitals:   01/20/18 1730  BP: 109/74  Pulse: (!) 128  Resp: 18  Temp: 99 F (37.2 C)  SpO2: 95%   No intake or output data in the 24 hours ending 01/20/18 2005 There were no vitals filed for this visit. There is no height or weight on file to calculate BMI.  General:  Well nourished, well developed, in no acute distress HEENT: normal Lymph: no adenopathy Neck: no JVD Endocrine:  No thryomegaly Vascular: No carotid bruits; pedal pulses 1+ bilaterally Cardiac:  normal S1, S2; RRR rapid ; no murmur gallup or rub Lungs:  clear to auscultation bilaterally, no wheezing, rhonchi or rales  Abd: soft, nontender, no hepatomegaly  Ext: no lower ext  edema Musculoskeletal:  No deformities, BUE and BLE strength normal and equal Skin: warm and dry  Neuro:  Alert and oriented X 3, no focal abnormalities noted Psych:  Normal affect     Relevant CV Studies:  Echo  Study Conclusions  - Left ventricle: Posterior basal hypokinessi. The cavity size was   normal. Wall thickness was normal. Systolic function was normal.   The estimated ejection fraction was in the range of 50% to 55%. - Aortic valve: Post TAVR with 26 mm Sapien 3 valve No perivalvular   regurgitation. - Left atrium: The atrium was moderately dilated. - Atrial septum: No defect or patent foramen ovale was identified.  Laboratory Data:  Chemistry Recent Labs  Lab 01/20/18 1745  NA 140  K 4.1  CL 111  CO2 22  GLUCOSE 146*  BUN 43*  CREATININE 1.92*    CALCIUM 10.1  GFRNONAA 31*  GFRAA 36*  ANIONGAP 7    Recent Labs  Lab 01/20/18 1745  PROT 7.0  ALBUMIN 4.0  AST 22  ALT 14  ALKPHOS 48  BILITOT 0.5   Hematology Recent Labs  Lab 01/20/18 1745  WBC 7.6  RBC 3.82*  HGB 10.5*  HCT 34.8*  MCV 91.1  MCH 27.5  MCHC 30.2  RDW 13.7  PLT 159   Cardiac EnzymesNo results for input(s): TROPONINI in the last 168 hours.  Recent Labs  Lab 01/20/18 1800  TROPIPOC 0.00    BNPNo results for input(s): BNP, PROBNP in the last 168 hours.  DDimer No results for input(s): DDIMER in the last 168 hours.  Radiology/Studies:  Dg Chest Portable 1 View  Result Date: 01/20/2018 CLINICAL DATA:  84 year old male with a history of neck tightness EXAM: PORTABLE CHEST 1 VIEW COMPARISON:  03/13/2017 FINDINGS: Cardiomediastinal silhouette unchanged in size and contour. No evidence of central vascular congestion. No pneumothorax or pleural effusion. Surgical changes of prior median sternotomy and CABG. Surgical changes of prior trans catheter aortic valve replacement. Cardiac pacing device/AICD unchanged. No confluent airspace disease. IMPRESSION: Chronic lung changes without evidence of acute cardiopulmonary disease. Surgical changes of prior median sternotomy and CABG, TAVR, and cardiac AICD Electronically Signed   By: Corrie Mckusick D.O.   On: 01/20/2018 18:17    Assessment and Plan:   1. A flutter/fib, waiting to interrogate device.  On xarelto and has not missed any doses.   Will admit and may add amiodarone.  Continue xarelto 2. Throat tightness may be due to tachycardia. Now resolved neg troponin.  3. CAD with CABG and redo, last cath 2015  total occlusion of left main and RCA. Patent LIMA to LAD, patent SVG to OM, patent SVG to RCA. Of note, diffuse disease in LAD both before and after LIMA graft insertion. Echo April 2019 with YKDX=83-38%, grade 2 diastolic dysfunction 4. AS with TAVR in 2015 and valve stable on echo 2019.    Severity of  Illness: The appropriate patient status for this patient is INPATIENT. Inpatient status is judged to be reasonable and necessary in order to provide the required intensity of service to ensure the patient's safety. The patient's presenting symptoms, physical exam findings, and initial radiographic and laboratory data in the context of their chronic comorbidities is felt to place them at high risk for further clinical deterioration. Furthermore, it is not anticipated that the patient will be medically stable for discharge from the hospital within 2 midnights of admission. The following factors support the patient status of inpatient.   " The patient's presenting symptoms include angina. " The worrisome physical exam findings include a flutter with RVR. " The initial radiographic and laboratory data are worrisome because of labs stable. " The chronic co-morbidities include CAD with CABG and redo and TAVR, out of hospital cardiac arrest and  ICD 2 lead. .   * I certify that at the point of admission it is my clinical judgment that the patient will require inpatient hospital care spanning beyond 2 midnights from the point of admission due to high intensity of service, high risk for further deterioration and high frequency of surveillance required.*    For questions or updates, please contact [place] Please consult [email or web address] for contact info under  Signed, Cecilie Kicks, NP  01/20/2018 8:05 PM

## 2018-01-21 ENCOUNTER — Other Ambulatory Visit: Payer: Self-pay

## 2018-01-21 ENCOUNTER — Inpatient Hospital Stay (HOSPITAL_COMMUNITY): Payer: Medicare Other

## 2018-01-21 ENCOUNTER — Encounter (HOSPITAL_COMMUNITY): Payer: Self-pay

## 2018-01-21 ENCOUNTER — Other Ambulatory Visit: Payer: Self-pay | Admitting: Physician Assistant

## 2018-01-21 DIAGNOSIS — I5022 Chronic systolic (congestive) heart failure: Secondary | ICD-10-CM

## 2018-01-21 DIAGNOSIS — I340 Nonrheumatic mitral (valve) insufficiency: Secondary | ICD-10-CM

## 2018-01-21 DIAGNOSIS — I214 Non-ST elevation (NSTEMI) myocardial infarction: Secondary | ICD-10-CM | POA: Diagnosis present

## 2018-01-21 DIAGNOSIS — I5023 Acute on chronic systolic (congestive) heart failure: Secondary | ICD-10-CM | POA: Diagnosis present

## 2018-01-21 HISTORY — DX: Chronic systolic (congestive) heart failure: I50.22

## 2018-01-21 LAB — CBC
HEMATOCRIT: 28.6 % — AB (ref 39.0–52.0)
HEMOGLOBIN: 8.9 g/dL — AB (ref 13.0–17.0)
MCH: 27.8 pg (ref 26.0–34.0)
MCHC: 31.1 g/dL (ref 30.0–36.0)
MCV: 89.4 fL (ref 80.0–100.0)
NRBC: 0 % (ref 0.0–0.2)
PLATELETS: 150 10*3/uL (ref 150–400)
RBC: 3.2 MIL/uL — AB (ref 4.22–5.81)
RDW: 13.6 % (ref 11.5–15.5)
WBC: 9.3 10*3/uL (ref 4.0–10.5)

## 2018-01-21 LAB — TROPONIN I
Troponin I: 1.25 ng/mL (ref <0.03)
Troponin I: 15.36 ng/mL (ref <0.03)
Troponin I: 5.34 ng/mL (ref <0.03)

## 2018-01-21 LAB — T4, FREE: Free T4: 1.21 ng/dL (ref 0.82–1.77)

## 2018-01-21 LAB — HEPATIC FUNCTION PANEL
ALT: 14 U/L (ref 0–44)
AST: 29 U/L (ref 15–41)
Albumin: 3.6 g/dL (ref 3.5–5.0)
Alkaline Phosphatase: 40 U/L (ref 38–126)
Total Bilirubin: 0.5 mg/dL (ref 0.3–1.2)
Total Protein: 6.6 g/dL (ref 6.5–8.1)

## 2018-01-21 LAB — BASIC METABOLIC PANEL
Anion gap: 10 (ref 5–15)
BUN: 38 mg/dL — AB (ref 8–23)
CALCIUM: 9.7 mg/dL (ref 8.9–10.3)
CO2: 21 mmol/L — ABNORMAL LOW (ref 22–32)
CREATININE: 1.85 mg/dL — AB (ref 0.61–1.24)
Chloride: 112 mmol/L — ABNORMAL HIGH (ref 98–111)
GFR calc Af Amer: 38 mL/min — ABNORMAL LOW (ref >60)
GFR calc non Af Amer: 33 mL/min — ABNORMAL LOW (ref >60)
Glucose, Bld: 138 mg/dL — ABNORMAL HIGH (ref 70–99)
Potassium: 4 mmol/L (ref 3.5–5.1)
SODIUM: 143 mmol/L (ref 135–145)

## 2018-01-21 LAB — LIPID PANEL
CHOLESTEROL: 130 mg/dL (ref 0–200)
HDL: 30 mg/dL — ABNORMAL LOW (ref >40)
LDL Cholesterol: 58 mg/dL (ref 0–99)
Total CHOL/HDL Ratio: 4.3 RATIO
Triglycerides: 209 mg/dL — ABNORMAL HIGH (ref <150)
VLDL: 42 mg/dL — ABNORMAL HIGH (ref 0–40)

## 2018-01-21 LAB — ECHOCARDIOGRAM COMPLETE
HEIGHTINCHES: 66.5 in
Weight: 2483.26 oz

## 2018-01-21 LAB — HEMOGLOBIN A1C
HEMOGLOBIN A1C: 6.2 % — AB (ref 4.8–5.6)
MEAN PLASMA GLUCOSE: 131.24 mg/dL

## 2018-01-21 LAB — HEMOGLOBIN AND HEMATOCRIT, BLOOD
HEMATOCRIT: 35.5 % — AB (ref 39.0–52.0)
HEMOGLOBIN: 10.5 g/dL — AB (ref 13.0–17.0)

## 2018-01-21 LAB — MAGNESIUM: Magnesium: 2.3 mg/dL (ref 1.7–2.4)

## 2018-01-21 LAB — MRSA PCR SCREENING: MRSA by PCR: NEGATIVE

## 2018-01-21 LAB — TSH: TSH: 2.398 u[IU]/mL (ref 0.350–4.500)

## 2018-01-21 LAB — PROTIME-INR
INR: 1.27
Prothrombin Time: 15.8 seconds — ABNORMAL HIGH (ref 11.4–15.2)

## 2018-01-21 MED ORDER — SODIUM CHLORIDE 0.9 % WEIGHT BASED INFUSION
1.0000 mL/kg/h | INTRAVENOUS | Status: DC
  Administered 2018-01-22: 1 mL/kg/h via INTRAVENOUS

## 2018-01-21 MED ORDER — ASPIRIN 81 MG PO CHEW
81.0000 mg | CHEWABLE_TABLET | ORAL | Status: AC
  Administered 2018-01-22: 81 mg via ORAL
  Filled 2018-01-21: qty 1

## 2018-01-21 MED ORDER — SODIUM CHLORIDE 0.9% FLUSH: 3.0000 mL | INTRAVENOUS | Status: DC | PRN

## 2018-01-21 MED ORDER — AMIODARONE IV BOLUS ONLY 150 MG/100ML
150.0000 mg | INTRAVENOUS | Status: DC | PRN
  Administered 2018-01-21: 150 mg via INTRAVENOUS
  Filled 2018-01-21: qty 100

## 2018-01-21 MED ORDER — SODIUM CHLORIDE 0.9% FLUSH
3.0000 mL | Freq: Two times a day (BID) | INTRAVENOUS | Status: DC
  Administered 2018-01-21: 3 mL via INTRAVENOUS

## 2018-01-21 MED ORDER — SODIUM CHLORIDE 0.9 % IV SOLN: 250.0000 mL | INTRAVENOUS | Status: DC | PRN

## 2018-01-21 MED ORDER — RIVAROXABAN 15 MG PO TABS
15.0000 mg | ORAL_TABLET | Freq: Every day | ORAL | Status: DC
  Filled 2018-01-21: qty 1

## 2018-01-21 MED ORDER — AMIODARONE IV BOLUS ONLY 150 MG/100ML: 150.0000 mg | INTRAVENOUS | Status: DC | PRN

## 2018-01-21 MED ORDER — PERFLUTREN LIPID MICROSPHERE
INTRAVENOUS | Status: AC
  Administered 2018-01-21: 2 mL via INTRAVENOUS
  Filled 2018-01-21: qty 10

## 2018-01-21 MED ORDER — METOPROLOL TARTRATE 25 MG PO TABS
25.0000 mg | ORAL_TABLET | Freq: Two times a day (BID) | ORAL | Status: DC
  Administered 2018-01-21 – 2018-01-23 (×3): 25 mg via ORAL
  Filled 2018-01-21 (×5): qty 1

## 2018-01-21 MED ORDER — HEPARIN (PORCINE) IN NACL 100-0.45 UNIT/ML-% IJ SOLN
INTRAMUSCULAR | Status: AC
  Filled 2018-01-21: qty 250

## 2018-01-21 MED ORDER — PERFLUTREN LIPID MICROSPHERE
1.0000 mL | INTRAVENOUS | Status: AC | PRN
  Administered 2018-01-21: 2 mL via INTRAVENOUS
  Filled 2018-01-21: qty 10

## 2018-01-21 MED ORDER — HEPARIN (PORCINE) IN NACL 100-0.45 UNIT/ML-% IJ SOLN
1000.0000 [IU]/h | INTRAMUSCULAR | Status: DC
  Administered 2018-01-21 – 2018-01-22 (×2): 1000 [IU]/h via INTRAVENOUS

## 2018-01-21 NOTE — Progress Notes (Signed)
ANTICOAGULATION CONSULT NOTE - Initial Consult  Pharmacy Consult for heparin Indication: atrial fibrillation  Allergies  Allergen Reactions  . Cyclobenzaprine Hcl Other (See Comments)    Unknown reaction  . Tetracycline Rash         Patient Measurements: Height: 5' 6.5" (168.9 cm) Weight: 155 lb 3.3 oz (70.4 kg) IBW/kg (Calculated) : 64.95 Heparin Dosing Weight: 70kg  Vital Signs: Temp: 98.7 F (37.1 C) (10/15 1000) Temp Source: Oral (10/15 1000) BP: 100/66 (10/15 1000) Pulse Rate: 64 (10/15 1000)  Labs: Recent Labs    01/20/18 1745 01/20/18 2359 01/21/18 0447 01/21/18 1100  HGB 10.5*  --  8.9*  --   HCT 34.8*  --  28.6*  --   PLT 159  --  150  --   LABPROT  --  15.8*  --   --   INR  --  1.27  --   --   CREATININE 1.92*  --  1.85*  --   TROPONINI  --  1.25* 5.34* 15.36*    Estimated Creatinine Clearance: 28.8 mL/min (A) (by C-G formula based on SCr of 1.85 mg/dL (H)).   Medical History: Past Medical History:  Diagnosis Date  . Aortic stenosis, severe    s/p TAVR with Edwards Sapien 3 THV (size 26 mm, model # 9600TFX, serial # [id number])  . Arthritis   . Cardiac arrest ([place])   . Chronic systolic CHF (congestive heart failure) ([place]) 01/21/2018  . Claudication ([place])   . Coronary artery disease   . Essential hypertension   . Headache   . History of hiatal hernia   . Hyperlipidemia   . Kidney stones   . Non-ST elevation (NSTEMI) myocardial infarction ([place]) 01/21/2018  . Peripheral vascular disease ([place])      Assessment: 32 yoM admitted with palpitations and CP now with rising troponins. Pt on Xarelto PTA but now to transition to IV heparin. Last dose of Xarelto was 10/14 at 2349 so will start heparin tonight at midnight and check 8hr heparin level and aPTT.   Goal of Therapy:  Heparin level 0.3-0.7 units/ml aPTT 66-102 seconds Monitor platelets by anticoagulation protocol: Yes   Plan:  -Stop Xarelto -Start heparin 1000 units/hr at midnight with no  bolus -Check 8hr aPTT/heparin level and CBC  Arrie Senate, PharmD, BCPS Clinical Pharmacist [phone] Please check AMION for all [place] numbers 01/21/2018

## 2018-01-21 NOTE — Progress Notes (Addendum)
CRITICAL VALUE ALERT  Critical Value: troponin 15.36  Date & Time Notied: 9597 01/21/18  Provider Notified: Barrett PA, 4718  Orders Received/Actions taken: keep NPO for cath

## 2018-01-21 NOTE — H&P (View-Only) (Signed)
Progress Note  Patient Name: [name] Date of Encounter: 01/21/2018  Primary Cardiologist:  Lauree Chandler, MD  Subjective   No chest pain overnight.  He denies shortness of breath.  He is aware he is back in sinus rhythm, he feels much better now.  Inpatient Medications    Scheduled Meds: . furosemide  40 mg Oral Daily  . latanoprost  1 drop Both Eyes QHS  . magnesium oxide  200 mg Oral QHS  . metoprolol tartrate  25 mg Oral BID  . potassium chloride  10 mEq Oral BID  . rivaroxaban  15 mg Oral Q supper  . rosuvastatin  40 mg Oral Daily  . thiamine  100 mg Oral Daily   Continuous Infusions: . amiodarone 30 mg/hr (01/21/18 1017)   PRN Meds: acetaminophen, calcium carbonate, diazepam, naphazoline-pheniramine, nitroGLYCERIN, ondansetron (ZOFRAN) IV   Vital Signs    Vitals:   01/20/18 2256 01/21/18 0300 01/21/18 0418 01/21/18 1000  BP: 99/64  (!) 95/51 100/66  Pulse: (!) 125 62 60 64  Resp: (!) 21 20 20    Temp: 98.4 F (36.9 C)  98.4 F (36.9 C) 98.7 F (37.1 C)  TempSrc: Oral  Oral Oral  SpO2: 98% 91% 94%   Weight:      Height:        Intake/Output Summary (Last 24 hours) at 01/21/2018 1341 Last data filed at 01/21/2018 0134 Gross per 24 hour  Intake 116.45 ml  Output -  Net 116.45 ml   Filed Weights   01/20/18 2250  Weight: 70.4 kg   Wt Readings from Last 3 Encounters:  01/20/18 70.4 kg  12/11/17 72.3 kg  10/16/17 69.9 kg     Telemetry    Atrial fib, RVR>>SR- Personally Reviewed  ECG    10/15, SR - Personally Reviewed  Physical Exam   General: Well developed, well nourished, male appearing in no acute distress. Head: Normocephalic, atraumatic.  Neck: Supple without bruits, JVD mildly elevated. Lungs:  Resp regular and unlabored, decreased breath sounds bases. Heart: RRR, S1, S2, no S3, S4, soft SEM and short DM; no rub. Abdomen: Soft, non-tender, non-distended with normoactive bowel sounds. No hepatomegaly. No  rebound/guarding. No obvious abdominal masses. Extremities: No clubbing, cyanosis, no edema. Distal pedal pulses are 2+ bilaterally. Neuro: Alert and oriented X 3. Moves all extremities spontaneously. Psych: Normal affect.  Labs    Hematology Recent Labs  Lab 01/20/18 1745 01/21/18 0447  WBC 7.6 9.3  RBC 3.82* 3.20*  HGB 10.5* 8.9*  HCT 34.8* 28.6*  MCV 91.1 89.4  MCH 27.5 27.8  MCHC 30.2 31.1  RDW 13.7 13.6  PLT 159 150    Chemistry Recent Labs  Lab 01/20/18 1745 01/20/18 2359 01/21/18 0447  NA 140  --  143  K 4.1  --  4.0  CL 111  --  112*  CO2 22  --  21*  GLUCOSE 146*  --  138*  BUN 43*  --  38*  CREATININE 1.92*  --  1.85*  CALCIUM 10.1  --  9.7  PROT 7.0 6.6  --   ALBUMIN 4.0 3.6  --   AST 22 29  --   ALT 14 14  --   ALKPHOS 48 40  --   BILITOT 0.5 0.5  --   GFRNONAA 31*  --  33*  GFRAA 36*  --  38*  ANIONGAP 7  --  10     Cardiac Enzymes Recent Labs  Lab 01/20/18  2359 01/21/18 0447 01/21/18 1100  TROPONINI 1.25* 5.34* 15.36*    Recent Labs  Lab 01/20/18 1800  TROPIPOC 0.00     BNP Recent Labs  Lab 01/20/18 2102  BNP 951.6*      Radiology    Dg Chest Portable 1 View  Result Date: 01/20/2018 CLINICAL DATA:  84 year old male with a history of neck tightness EXAM: PORTABLE CHEST 1 VIEW COMPARISON:  03/13/2017 FINDINGS: Cardiomediastinal silhouette unchanged in size and contour. No evidence of central vascular congestion. No pneumothorax or pleural effusion. Surgical changes of prior median sternotomy and CABG. Surgical changes of prior trans catheter aortic valve replacement. Cardiac pacing device/AICD unchanged. No confluent airspace disease. IMPRESSION: Chronic lung changes without evidence of acute cardiopulmonary disease. Surgical changes of prior median sternotomy and CABG, TAVR, and cardiac AICD Electronically Signed   By: Corrie Mckusick D.O.   On: 01/20/2018 18:17     Cardiac Studies   ECHO: Ordered  Patient Profile     84  y.o. male w/ hx CABG in 1982 and redo in 1994,  ICM and out of hospital cardiac arrest with successful resuscitation and MDT ICD. PAF, AS s/p TAVR 2015. Cath 2015>>med rx   Assessment & Plan     Principal Problem: 1.  Atrial flutter with rapid ventricular response (HCC) -Amiodarone added 10/14 with spontaneous conversion to sinus rhythm this a.m. -he feels much better now -Continue amiodarone, discuss changing to 400 mg twice daily with MD **Addendum-just after note completed, patient was back into rapid atrial fibrillation -Spoke to the nurse, will order a bolus of amiodarone that can be repeated every 4 hours as needed for rapid atrial fibrillation. -Hold off on changing to p.o. amio for now -Follow for symptoms -Blood pressure 118/63, so will not give IV metoprolol or Cardizem  Active Problems: 2.  Non-ST elevation (NSTEMI) myocardial infarction [facility]) - He had his usual upper chest pain prior to admission, felt secondary to rapid ventricular rate.  Symptoms have not returned since he got to the hospital. -However, his troponin has continued to climb. - DC Xarelto, add heparin, plan cath in a.m. -Check echo  3.  Chronic systolic CHF (congestive heart failure) (HCC) -His weight essentially is at baseline -However, his OptiVol showed increased impedance and his BNP is elevated. -Mild volume overload on exam without acute CHF/edema on chest x-ray - Discuss a one-time dose of IV Lasix with MD versus waiting till after the heart catheterization  4.  Anemia: - Hgb much lower than usual for him. -No obvious bleeding - Guaiac stools -Recheck H&H this p.m. -If blood counts are continuing to fall, may have to defer cath for now.  5.  Hyperglycemia -Mild elevation in blood sugars and hemoglobin A1c mildly elevated at 6.2 - Follow blood sugars here but no changes in home meds. -Encourage dietary changes and follow-up with PCP -Discuss with MD if he should be on a diabetic  diet  Signed, Rosaria Ferries , PA-C 1:41 PM 01/21/2018 Pager: [phone] --------------------------------------------------------------------------   History and all data above reviewed.  Patient examined.  I agree with the findings as above.  [name] is doing well, has no current anginal symptoms.    All available labs, radiology testing, previous records reviewed. Agree with documented assessment and plan of my colleague as stated above with the following additions or changes:  Principal Problem:   Non-ST elevation (NSTEMI) myocardial infarction [facility]) Active Problems:   Implantable cardioverter-defibrillator (ICD) in situ   Atrial flutter with  rapid ventricular response (HCC)   Chronic systolic CHF (congestive heart failure) (HCC)   Atrial tachycardia ([place])   GEN: Well nourished, well developed in no acute distress HEENT: Normal NECK: No JVD; No carotid bruits CARDIAC: The rate and rhythm but tachycardic, no murmurs, rubs, gallops RESPIRATORY:  Clear to auscultation without rales, wheezing or rhonchi  ABDOMEN: Soft, non-tender, non-distended MUSCULOSKELETAL:  No edema; No deformity  SKIN: Warm and dry NEUROLOGIC:  Alert and oriented x 3 PSYCHIATRIC:  Normal affect    Plan: His anginal equivalent symptom of throat tightness and troponin of 15 raise concern for NSTEMI. We will arrange for coronary angiography tomorrow since he appropriately received his rivaroxaban yesterday evening.   In the afternoon yesterday, he returned to a rapid atrial rhythm with ventricular pacing at 130 bpm.  After device interrogation it appeared that he was in atrial tachycardia inappropriately pacing to the ventricle at a rapid rate.  Device interrogation and adjustments were performed, and after adjusting the tracking of the atria his conduction was 2:1 with a rate of approximately 60 ventricular native conduction.  His back-up pacing rate is approximately 50.  I am hopeful that this  will improve his ventricular filling and in turn improve his blood pressure.  There is no strong indication for continued amio boluses to control heart rate, however amiodarone infusion can be continued if atrial flutter/atrial fibrillation is seen underlying.  Can discuss discontinuation of amiodarone infusion tomorrow morning.  Elouise Munroe, MD [facility]

## 2018-01-21 NOTE — Care Management Note (Signed)
Case Management Note  Patient Details  Name: [name] MRN: [id number] Date of Birth: [birth year]  Subjective/Objective:                 A flutter RVR, ICD, Xaralto PTA   Action/Plan:  Patient from with wife, low readmission score 14%, no CM needs identified at this time. Will continue to follow.  Expected Discharge Date:                  Expected Discharge Plan:     In-House Referral:     Discharge planning Services  CM Consult  Post Acute Care Choice:    Choice offered to:     DME Arranged:    DME Agency:     HH Arranged:    HH Agency:     Status of Service:  In process, will continue to follow  If discussed at Long Length of Stay Meetings, dates discussed:    Additional Comments:  Carles Collet, RN 01/21/2018, 10:43 AM

## 2018-01-21 NOTE — Progress Notes (Signed)
CRITICAL VALUE ALERT  Critical Value:  Troponin 1.25  Date & Time Notied:  01/21/18 0055  Provider Notified: [facility]  Orders Received/Actions taken: Awaiting

## 2018-01-21 NOTE — Progress Notes (Signed)
[place] for Xarelto Indication: atrial fibrillation  Assessment: Patient's current renal function necessitates Xarelto dose adjustment  Goal of Therapy:  Monitor platelets by anticoagulation protocol: Yes   Plan:  Change Xarelto to 15 mg daily with supper Watch renal function   Narda Bonds 01/21/2018,7:42 AM

## 2018-01-21 NOTE — Progress Notes (Signed)
Pt states he is on CPAP at home but is refusing to have CPAP at this time. Will continue to monitor.

## 2018-01-21 NOTE — Progress Notes (Signed)
  Echocardiogram 2D Echocardiogram has been performed.  Technically difficult to assess LV function with definity due to HR.   [name] 01/21/2018, 3:37 PM

## 2018-01-21 NOTE — Consult Note (Signed)
[facility] CM Primary Care Navigator  01/21/2018  [name] 1933-08-12 [id number]   Met withpatientand wife [name]) at the [place] identify possible discharge needs. Patientreports having"increased pulse rate (noted on fitbit) and felt tightness to throat" that had led to this admission. (atrial flutter with rapid ventricular response, Non-ST elevation (NSTEMI) myocardial infarction)  Patientendorses Dr. Billey Gosling with [place] [facility] as [place] care provider.   Patient's wife verbalizedusing [facility] at [facility] obtain medications without difficulty so far.   Patientreportsmanaginghisownmedications at [employer] use of "pill box" system filled once a week.  Patientverbalized that he has been driving prior to admission but wife and daughter [name]) will be able to providetransportation to hisdoctors' appointmentsif neededafter discharge.  Patientliveswith wife and daughter [name] as hisprimary caregivers at home once discharge.  Anticipateddischarge planishomeaccording to wife.  Patientand wife voiced understanding to call primary care provider's office when he returnshomefor a post discharge follow-upvisit within 1- 2weeksor sooner if needs arise.Patient letter (with PCP's contact number) was provided asa reminder.   Discussed with patient and wife about[facility] CM services available for health managementand resourcesat home butboth denied needsor concerns at this point.Patientexpressedthat he does not feel the need for services yet, since wife (diabetic) is there to help him manage his health needs at home. Both are capable and knowledgeable on ways to manage health conditions. Patient reports monitoring his weight daily and follows-up with provider when needed.  Encouraged patientand wife to seekreferral from primary care provider to [facility] care management if  deemednecessaryand appropriate for any services in thenearfuture.  [facility] care management information was provided for future needs thathemay have.  Patienthad onlyopted andverbally agreedforEMMI calls to follow-up with his recovery at home.  Referral made for [facility] General calls after discharge.   For additional questions please contact:  Edwena Felty A. Sherre Wooton, BSN, RN-BC [facility] PRIMARY CARE Navigator Cell: [phone]

## 2018-01-21 NOTE — Progress Notes (Addendum)
Progress Note  Patient Name: [name] Date of Encounter: 01/21/2018  Primary Cardiologist:  Lauree Chandler, MD  Subjective   No chest pain overnight.  He denies shortness of breath.  He is aware he is back in sinus rhythm, he feels much better now.  Inpatient Medications    Scheduled Meds: . furosemide  40 mg Oral Daily  . latanoprost  1 drop Both Eyes QHS  . magnesium oxide  200 mg Oral QHS  . metoprolol tartrate  25 mg Oral BID  . potassium chloride  10 mEq Oral BID  . rivaroxaban  15 mg Oral Q supper  . rosuvastatin  40 mg Oral Daily  . thiamine  100 mg Oral Daily   Continuous Infusions: . amiodarone 30 mg/hr (01/21/18 1017)   PRN Meds: acetaminophen, calcium carbonate, diazepam, naphazoline-pheniramine, nitroGLYCERIN, ondansetron (ZOFRAN) IV   Vital Signs    Vitals:   01/20/18 2256 01/21/18 0300 01/21/18 0418 01/21/18 1000  BP: 99/64  (!) 95/51 100/66  Pulse: (!) 125 62 60 64  Resp: (!) 21 20 20    Temp: 98.4 F (36.9 C)  98.4 F (36.9 C) 98.7 F (37.1 C)  TempSrc: Oral  Oral Oral  SpO2: 98% 91% 94%   Weight:      Height:        Intake/Output Summary (Last 24 hours) at 01/21/2018 1341 Last data filed at 01/21/2018 0134 Gross per 24 hour  Intake 116.45 ml  Output -  Net 116.45 ml   Filed Weights   01/20/18 2250  Weight: 70.4 kg   Wt Readings from Last 3 Encounters:  01/20/18 70.4 kg  12/11/17 72.3 kg  10/16/17 69.9 kg     Telemetry    Atrial fib, RVR>>SR- Personally Reviewed  ECG    10/15, SR - Personally Reviewed  Physical Exam   General: Well developed, well nourished, male appearing in no acute distress. Head: Normocephalic, atraumatic.  Neck: Supple without bruits, JVD mildly elevated. Lungs:  Resp regular and unlabored, decreased breath sounds bases. Heart: RRR, S1, S2, no S3, S4, soft SEM and short DM; no rub. Abdomen: Soft, non-tender, non-distended with normoactive bowel sounds. No hepatomegaly. No  rebound/guarding. No obvious abdominal masses. Extremities: No clubbing, cyanosis, no edema. Distal pedal pulses are 2+ bilaterally. Neuro: Alert and oriented X 3. Moves all extremities spontaneously. Psych: Normal affect.  Labs    Hematology Recent Labs  Lab 01/20/18 1745 01/21/18 0447  WBC 7.6 9.3  RBC 3.82* 3.20*  HGB 10.5* 8.9*  HCT 34.8* 28.6*  MCV 91.1 89.4  MCH 27.5 27.8  MCHC 30.2 31.1  RDW 13.7 13.6  PLT 159 150    Chemistry Recent Labs  Lab 01/20/18 1745 01/20/18 2359 01/21/18 0447  NA 140  --  143  K 4.1  --  4.0  CL 111  --  112*  CO2 22  --  21*  GLUCOSE 146*  --  138*  BUN 43*  --  38*  CREATININE 1.92*  --  1.85*  CALCIUM 10.1  --  9.7  PROT 7.0 6.6  --   ALBUMIN 4.0 3.6  --   AST 22 29  --   ALT 14 14  --   ALKPHOS 48 40  --   BILITOT 0.5 0.5  --   GFRNONAA 31*  --  33*  GFRAA 36*  --  38*  ANIONGAP 7  --  10     Cardiac Enzymes Recent Labs  Lab 01/20/18  2359 01/21/18 0447 01/21/18 1100  TROPONINI 1.25* 5.34* 15.36*    Recent Labs  Lab 01/20/18 1800  TROPIPOC 0.00     BNP Recent Labs  Lab 01/20/18 2102  BNP 951.6*      Radiology    Dg Chest Portable 1 View  Result Date: 01/20/2018 CLINICAL DATA:  84 year old male with a history of neck tightness EXAM: PORTABLE CHEST 1 VIEW COMPARISON:  03/13/2017 FINDINGS: Cardiomediastinal silhouette unchanged in size and contour. No evidence of central vascular congestion. No pneumothorax or pleural effusion. Surgical changes of prior median sternotomy and CABG. Surgical changes of prior trans catheter aortic valve replacement. Cardiac pacing device/AICD unchanged. No confluent airspace disease. IMPRESSION: Chronic lung changes without evidence of acute cardiopulmonary disease. Surgical changes of prior median sternotomy and CABG, TAVR, and cardiac AICD Electronically Signed   By: Corrie Mckusick D.O.   On: 01/20/2018 18:17     Cardiac Studies   ECHO: Ordered  Patient Profile     84  y.o. male w/ hx CABG in 1982 and redo in 1994,  ICM and out of hospital cardiac arrest with successful resuscitation and MDT ICD. PAF, AS s/p TAVR 2015. Cath 2015>>med rx   Assessment & Plan     Principal Problem: 1.  Atrial flutter with rapid ventricular response (HCC) -Amiodarone added 10/14 with spontaneous conversion to sinus rhythm this a.m. -he feels much better now -Continue amiodarone, discuss changing to 400 mg twice daily with MD **Addendum-just after note completed, patient was back into rapid atrial fibrillation -Spoke to the nurse, will order a bolus of amiodarone that can be repeated every 4 hours as needed for rapid atrial fibrillation. -Hold off on changing to p.o. amio for now -Follow for symptoms -Blood pressure 118/63, so will not give IV metoprolol or Cardizem  Active Problems: 2.  Non-ST elevation (NSTEMI) myocardial infarction [facility]) - He had his usual upper chest pain prior to admission, felt secondary to rapid ventricular rate.  Symptoms have not returned since he got to the hospital. -However, his troponin has continued to climb. - DC Xarelto, add heparin, plan cath in a.m. -Check echo  3.  Chronic systolic CHF (congestive heart failure) (HCC) -His weight essentially is at baseline -However, his OptiVol showed increased impedance and his BNP is elevated. -Mild volume overload on exam without acute CHF/edema on chest x-ray - Discuss a one-time dose of IV Lasix with MD versus waiting till after the heart catheterization  4.  Anemia: - Hgb much lower than usual for him. -No obvious bleeding - Guaiac stools -Recheck H&H this p.m. -If blood counts are continuing to fall, may have to defer cath for now.  5.  Hyperglycemia -Mild elevation in blood sugars and hemoglobin A1c mildly elevated at 6.2 - Follow blood sugars here but no changes in home meds. -Encourage dietary changes and follow-up with PCP -Discuss with MD if he should be on a diabetic  diet  Signed, Rosaria Ferries , PA-C 1:41 PM 01/21/2018 Pager: [phone] --------------------------------------------------------------------------   History and all data above reviewed.  Patient examined.  I agree with the findings as above.  [name] is doing well, has no current anginal symptoms.    All available labs, radiology testing, previous records reviewed. Agree with documented assessment and plan of my colleague as stated above with the following additions or changes:  Principal Problem:   Non-ST elevation (NSTEMI) myocardial infarction [facility]) Active Problems:   Implantable cardioverter-defibrillator (ICD) in situ   Atrial flutter with  rapid ventricular response (HCC)   Chronic systolic CHF (congestive heart failure) (HCC)   Atrial tachycardia ([place])   GEN: Well nourished, well developed in no acute distress HEENT: Normal NECK: No JVD; No carotid bruits CARDIAC: The rate and rhythm but tachycardic, no murmurs, rubs, gallops RESPIRATORY:  Clear to auscultation without rales, wheezing or rhonchi  ABDOMEN: Soft, non-tender, non-distended MUSCULOSKELETAL:  No edema; No deformity  SKIN: Warm and dry NEUROLOGIC:  Alert and oriented x 3 PSYCHIATRIC:  Normal affect    Plan: His anginal equivalent symptom of throat tightness and troponin of 15 raise concern for NSTEMI. We will arrange for coronary angiography tomorrow since he appropriately received his rivaroxaban yesterday evening.   In the afternoon yesterday, he returned to a rapid atrial rhythm with ventricular pacing at 130 bpm.  After device interrogation it appeared that he was in atrial tachycardia inappropriately pacing to the ventricle at a rapid rate.  Device interrogation and adjustments were performed, and after adjusting the tracking of the atria his conduction was 2:1 with a rate of approximately 60 ventricular native conduction.  His back-up pacing rate is approximately 50.  I am hopeful that this  will improve his ventricular filling and in turn improve his blood pressure.  There is no strong indication for continued amio boluses to control heart rate, however amiodarone infusion can be continued if atrial flutter/atrial fibrillation is seen underlying.  Can discuss discontinuation of amiodarone infusion tomorrow morning.  Elouise Munroe, MD [facility]

## 2018-01-21 NOTE — Progress Notes (Signed)
Patient back in atrial rhythm, V-paced in the 130s. EKG completed, MD notified. Amio bolus ordered q4h PRN.

## 2018-01-22 ENCOUNTER — Encounter (HOSPITAL_COMMUNITY)
Admission: EM | Disposition: A | Discharge status: Home / Self Care | Payer: Self-pay | Source: Home / Self Care | Attending: Cardiology

## 2018-01-22 DIAGNOSIS — I5022 Chronic systolic (congestive) heart failure: Secondary | ICD-10-CM

## 2018-01-22 DIAGNOSIS — I214 Non-ST elevation (NSTEMI) myocardial infarction: Principal | ICD-10-CM

## 2018-01-22 DIAGNOSIS — I471 Supraventricular tachycardia: Secondary | ICD-10-CM | POA: Diagnosis present

## 2018-01-22 DIAGNOSIS — Z95810 Presence of automatic (implantable) cardiac defibrillator: Secondary | ICD-10-CM

## 2018-01-22 HISTORY — PX: LEFT HEART CATH AND CORS/GRAFTS ANGIOGRAPHY: CATH118250

## 2018-01-22 LAB — CBC
HCT: 29.3 % — ABNORMAL LOW (ref 39.0–52.0)
HEMOGLOBIN: 9.1 g/dL — AB (ref 13.0–17.0)
MCH: 27.6 pg (ref 26.0–34.0)
MCHC: 31.1 g/dL (ref 30.0–36.0)
MCV: 88.8 fL (ref 80.0–100.0)
NRBC: 0 % (ref 0.0–0.2)
Platelets: 145 10*3/uL — ABNORMAL LOW (ref 150–400)
RBC: 3.3 MIL/uL — ABNORMAL LOW (ref 4.22–5.81)
RDW: 13.8 % (ref 11.5–15.5)
WBC: 6.2 10*3/uL (ref 4.0–10.5)

## 2018-01-22 LAB — BASIC METABOLIC PANEL
Anion gap: 8 (ref 5–15)
BUN: 26 mg/dL — ABNORMAL HIGH (ref 8–23)
CHLORIDE: 109 mmol/L (ref 98–111)
CO2: 23 mmol/L (ref 22–32)
Calcium: 9.5 mg/dL (ref 8.9–10.3)
Creatinine, Ser: 1.7 mg/dL — ABNORMAL HIGH (ref 0.61–1.24)
GFR calc non Af Amer: 36 mL/min — ABNORMAL LOW (ref >60)
GFR, EST AFRICAN AMERICAN: 42 mL/min — AB (ref >60)
Glucose, Bld: 122 mg/dL — ABNORMAL HIGH (ref 70–99)
POTASSIUM: 3.9 mmol/L (ref 3.5–5.1)
SODIUM: 140 mmol/L (ref 135–145)

## 2018-01-22 LAB — POCT ACTIVATED CLOTTING TIME: ACTIVATED CLOTTING TIME: 142 s

## 2018-01-22 LAB — APTT: aPTT: 102 seconds — ABNORMAL HIGH (ref 24–36)

## 2018-01-22 LAB — HEPARIN LEVEL (UNFRACTIONATED): Heparin Unfractionated: 1.62 IU/mL — ABNORMAL HIGH (ref 0.30–0.70)

## 2018-01-22 SURGERY — LEFT HEART CATH AND CORS/GRAFTS ANGIOGRAPHY
Anesthesia: LOCAL

## 2018-01-22 MED ORDER — LIDOCAINE HCL (PF) 1 % IJ SOLN
INTRAMUSCULAR | Status: DC | PRN
  Administered 2018-01-22: 15 mL via INTRADERMAL

## 2018-01-22 MED ORDER — SODIUM CHLORIDE 0.9% FLUSH: 3.0000 mL | INTRAVENOUS | Status: DC | PRN

## 2018-01-22 MED ORDER — MIDAZOLAM HCL 2 MG/2ML IJ SOLN
INTRAMUSCULAR | Status: AC
  Filled 2018-01-22: qty 2

## 2018-01-22 MED ORDER — SODIUM CHLORIDE 0.9 % IV SOLN
INTRAVENOUS | Status: AC
  Administered 2018-01-22: 17:00:00 via INTRAVENOUS

## 2018-01-22 MED ORDER — LIDOCAINE HCL (PF) 1 % IJ SOLN
INTRAMUSCULAR | Status: AC
  Filled 2018-01-22: qty 30

## 2018-01-22 MED ORDER — HEPARIN (PORCINE) IN NACL 1000-0.9 UT/500ML-% IV SOLN
INTRAVENOUS | Status: AC
  Filled 2018-01-22: qty 500

## 2018-01-22 MED ORDER — FENTANYL CITRATE (PF) 100 MCG/2ML IJ SOLN
INTRAMUSCULAR | Status: DC | PRN
  Administered 2018-01-22: 25 ug via INTRAVENOUS

## 2018-01-22 MED ORDER — HEPARIN (PORCINE) IN NACL 100-0.45 UNIT/ML-% IJ SOLN
950.0000 [IU]/h | INTRAMUSCULAR | Status: DC
  Administered 2018-01-23 (×2): 950 [IU]/h via INTRAVENOUS
  Filled 2018-01-22: qty 250

## 2018-01-22 MED ORDER — SODIUM CHLORIDE 0.9% FLUSH
3.0000 mL | Freq: Two times a day (BID) | INTRAVENOUS | Status: DC
  Administered 2018-01-22 – 2018-01-23 (×2): 3 mL via INTRAVENOUS

## 2018-01-22 MED ORDER — HEPARIN (PORCINE) IN NACL 1000-0.9 UT/500ML-% IV SOLN
INTRAVENOUS | Status: DC | PRN
  Administered 2018-01-22 (×2): 500 mL

## 2018-01-22 MED ORDER — SODIUM CHLORIDE 0.9 % IV SOLN: 250.0000 mL | INTRAVENOUS | Status: DC | PRN

## 2018-01-22 MED ORDER — IOHEXOL 350 MG/ML SOLN
INTRAVENOUS | Status: DC | PRN
  Administered 2018-01-22: 90 mL via INTRA_ARTERIAL

## 2018-01-22 MED ORDER — MIDAZOLAM HCL 2 MG/2ML IJ SOLN
INTRAMUSCULAR | Status: DC | PRN
  Administered 2018-01-22: 1 mg via INTRAVENOUS

## 2018-01-22 MED ORDER — FENTANYL CITRATE (PF) 100 MCG/2ML IJ SOLN
INTRAMUSCULAR | Status: AC
  Filled 2018-01-22: qty 2

## 2018-01-22 SURGICAL SUPPLY — 12 items
CATH INFINITI 5FR MPB2 (CATHETERS) ×1 IMPLANT
CATH INFINITI 5FR MULTPACK ANG (CATHETERS) ×1 IMPLANT
CLOSURE MYNX CONTROL 5F (Vascular Products) ×1 IMPLANT
KIT HEART LEFT (KITS) ×2 IMPLANT
PACK CARDIAC CATHETERIZATION (CUSTOM PROCEDURE TRAY) ×2 IMPLANT
SHEATH PINNACLE 5F 10CM (SHEATH) ×1 IMPLANT
TRANSDUCER W/STOPCOCK (MISCELLANEOUS) ×2 IMPLANT
TUBING CIL FLEX 10 FLL-RA (TUBING) ×2 IMPLANT
WIRE EMERALD 3MM-J .025X260CM (WIRE) ×1 IMPLANT
WIRE EMERALD 3MM-J .035X150CM (WIRE) ×2 IMPLANT
WIRE EMERALD 3MM-J .035X260CM (WIRE) ×1 IMPLANT
WIRE HI TORQ VERSACORE-J 145CM (WIRE) ×1 IMPLANT

## 2018-01-22 NOTE — Progress Notes (Signed)
[place] for heparin Indication: atrial fibrillation  Allergies  Allergen Reactions  . Cyclobenzaprine Hcl Other (See Comments)    Unknown reaction  . Tetracycline Rash         Patient Measurements: Height: 5' 6.5" (168.9 cm) Weight: 153 lb (69.4 kg) IBW/kg (Calculated) : 64.95 Heparin Dosing Weight: 70kg  Vital Signs: Temp: 97.7 F (36.5 C) (10/16 1245) Temp Source: Oral (10/16 1245) BP: 118/54 (10/16 1634) Pulse Rate: 0 (10/16 1644)  Labs: Recent Labs    01/20/18 1745 01/20/18 2359 01/21/18 0447 01/21/18 1100 01/21/18 1409 01/22/18 0745  HGB 10.5*  --  8.9*  --  10.5* 9.1*  HCT 34.8*  --  28.6*  --  35.5* 29.3*  PLT 159  --  150  --   --  145*  APTT  --   --   --   --   --  102*  LABPROT  --  15.8*  --   --   --   --   INR  --  1.27  --   --   --   --   HEPARINUNFRC  --   --   --   --   --  1.62*  CREATININE 1.92*  --  1.85*  --   --  1.70*  TROPONINI  --  1.25* 5.34* 15.36*  --   --     Estimated Creatinine Clearance: 31.3 mL/min (A) (by C-G formula based on SCr of 1.7 mg/dL (H)).   Medical History: Past Medical History:  Diagnosis Date  . Aortic stenosis, severe    s/p TAVR with Edwards Sapien 3 THV (size 26 mm, model # 9600TFX, serial # [id number])  . Arthritis   . Cardiac arrest ([place])   . Chronic systolic CHF (congestive heart failure) ([place]) 01/21/2018  . Claudication ([place])   . Coronary artery disease   . Essential hypertension   . Headache   . History of hiatal hernia   . Hyperlipidemia   . Kidney stones   . Non-ST elevation (NSTEMI) myocardial infarction ([place]) 01/21/2018  . Peripheral vascular disease ([place])      Assessment: 81 yoM admitted with palpitations and CP now with rising troponins. Pt on Xarelto PTA but now to transition to IV heparin. Last dose of Xarelto was 10/14 at 2349.   Underwent cardiac cath showing no obvious culprit lesion (possibly demand ischemia). Plan to resume heparin 8 hours  after sheath removal (documented on 10/16@1632 ). Will resume at 950 units/hr given therapeutic level; however, on higher end of goal. Hgb 9.1, plt 145.  Goal of Therapy:  Heparin level 0.3-0.7 units/ml aPTT 66-102 seconds Monitor platelets by anticoagulation protocol: Yes   Plan:  -Start heparin infusion at 950 units/hr on 10/17@0030  -Obtain aPTT and heparin level 8 hours after restart -Daily heparin level and CBC -Follow up plans to transition back to Xarelto - per cath notes to resume on 10/17 in PM   Doylene Canard, PharmD Clinical Pharmacist  Pager: [phone] Phone: [phone] Please check Amion for pharmacy contact number

## 2018-01-22 NOTE — Progress Notes (Addendum)
Progress Note  Patient Name: [name] Date of Encounter: 01/22/2018  Primary Cardiologist: Lauree Chandler, MD  EP Caryl Comes  Subjective   No chest pain or SOB  Inpatient Medications    Scheduled Meds: . furosemide  40 mg Oral Daily  . latanoprost  1 drop Both Eyes QHS  . magnesium oxide  200 mg Oral QHS  . metoprolol tartrate  25 mg Oral BID  . potassium chloride  10 mEq Oral BID  . rosuvastatin  40 mg Oral Daily  . sodium chloride flush  3 mL Intravenous Q12H  . thiamine  100 mg Oral Daily   Continuous Infusions: . sodium chloride    . sodium chloride 1 mL/kg/hr (01/22/18 0535)  . amiodarone 30 mg/hr (01/22/18 0600)  . amiodarone 150 mg (01/21/18 1646)  . heparin 1,000 Units/hr (01/22/18 0600)   PRN Meds: sodium chloride, acetaminophen, amiodarone, calcium carbonate, diazepam, naphazoline-pheniramine, nitroGLYCERIN, ondansetron (ZOFRAN) IV, sodium chloride flush   Vital Signs    Vitals:   01/21/18 2342 01/22/18 0420 01/22/18 0540 01/22/18 0754  BP: (!) 109/51 (!) 108/49  131/60  Pulse: 61 62 62   Resp: (!) 21 20 20    Temp: 98.1 F (36.7 C) 98 F (36.7 C)  98.7 F (37.1 C)  TempSrc: Oral Oral  Oral  SpO2: 96% 97% 97% (!) 87%  Weight:   69.4 kg   Height:        Intake/Output Summary (Last 24 hours) at 01/22/2018 0905 Last data filed at 01/22/2018 0600 Gross per 24 hour  Intake 1111.94 ml  Output 850 ml  Net 261.94 ml   Filed Weights   01/20/18 2250 01/22/18 0540  Weight: 70.4 kg 69.4 kg    Telemetry    Still 2:1 conduction with pacing  - Personally Reviewed  ECG    Atrial tach - Personally Reviewed  Physical Exam   GEN: No acute distress.   Neck: No JVD Cardiac: RRR, no murmurs, rubs, or gallops.  Respiratory: few rales in bases to auscultation bilaterally. GI: Soft, nontender, non-distended  MS: No edema; No deformity. Neuro:  Nonfocal  Psych: Normal affect   Labs    Chemistry Recent Labs  Lab 01/20/18 1745  01/20/18 2359 01/21/18 0447 01/22/18 0745  NA 140  --  143 140  K 4.1  --  4.0 3.9  CL 111  --  112* 109  CO2 22  --  21* 23  GLUCOSE 146*  --  138* 122*  BUN 43*  --  38* 26*  CREATININE 1.92*  --  1.85* 1.70*  CALCIUM 10.1  --  9.7 9.5  PROT 7.0 6.6  --   --   ALBUMIN 4.0 3.6  --   --   AST 22 29  --   --   ALT 14 14  --   --   ALKPHOS 48 40  --   --   BILITOT 0.5 0.5  --   --   GFRNONAA 31*  --  33* 36*  GFRAA 36*  --  38* 42*  ANIONGAP 7  --  10 8     Hematology Recent Labs  Lab 01/20/18 1745 01/21/18 0447 01/21/18 1409 01/22/18 0745  WBC 7.6 9.3  --  6.2  RBC 3.82* 3.20*  --  3.30*  HGB 10.5* 8.9* 10.5* 9.1*  HCT 34.8* 28.6* 35.5* 29.3*  MCV 91.1 89.4  --  88.8  MCH 27.5 27.8  --  27.6  MCHC 30.2 31.1  --  31.1  RDW 13.7 13.6  --  13.8  PLT 159 150  --  145*    Cardiac Enzymes Recent Labs  Lab 01/20/18 2359 01/21/18 0447 01/21/18 1100  TROPONINI 1.25* 5.34* 15.36*    Recent Labs  Lab 01/20/18 1800  TROPIPOC 0.00     BNP Recent Labs  Lab 01/20/18 2102  BNP 951.6*     DDimer No results for input(s): DDIMER in the last 168 hours.   Radiology    Dg Chest Portable 1 View  Result Date: 01/20/2018 CLINICAL DATA:  84 year old male with a history of neck tightness EXAM: PORTABLE CHEST 1 VIEW COMPARISON:  03/13/2017 FINDINGS: Cardiomediastinal silhouette unchanged in size and contour. No evidence of central vascular congestion. No pneumothorax or pleural effusion. Surgical changes of prior median sternotomy and CABG. Surgical changes of prior trans catheter aortic valve replacement. Cardiac pacing device/AICD unchanged. No confluent airspace disease. IMPRESSION: Chronic lung changes without evidence of acute cardiopulmonary disease. Surgical changes of prior median sternotomy and CABG, TAVR, and cardiac AICD Electronically Signed   By: Corrie Mckusick D.O.   On: 01/20/2018 18:17    Cardiac Studies   Echo 01/21/18 Study Conclusions  - Left  ventricle: The cavity size was normal. Wall thickness was   normal. Systolic function was moderately reduced. The estimated   ejection fraction was in the range of 35% to 40%. Severe   hypokinesis of the inferolateral myocardium; consistent with   ischemia or infarction in the distribution of the left circumflex   coronary artery. The study is not technically sufficient to allow   evaluation of LV diastolic function. - Ventricular septum: Septal motion showed dyssynergy and paradox.   These changes are consistent with right ventricular pacing. - Aortic valve: A stent-valve (TAVR) bioprosthesis was present and   functioning normally. Valve area (VTI): 1.47 cm^2. Valve area   (Vmax): 1.52 cm^2. Valve area (Vmean): 1.52 cm^2. - Mitral valve: Calcified annulus. There was mild regurgitation. - Left atrium: The atrium was severely dilated. - Right atrium: The atrium was mildly dilated.  Impressions:  - The rhythm appears to be atrial flutter/atrial tachycardia with   demand ventricular pacing (failure to detect arrhythmia?). There   is profound apex-o-base and septal-lateral RV-pacing induced   dyssynchrony, that appears to be largely responsible for the drop   in LV systolic function.   Patient Profile     84 y.o. male with Hx of CABG in 1982 and redo in 1994,  ICM and out of hospital cardiac arrest with successful resuscitation and MDT ICD.  And PAF Then AS and TAVR 2015.  Now admitted with throat tightness his angina, and tachycardia Assessment & Plan    Principal Problem:   Non-ST elevation (NSTEMI) myocardial infarction [facility])  For cath today  The patient understands that risks included but are not limited to stroke (1 in 1000), death (1 in 78), kidney failure [usually temporary] (1 in 500), bleeding (1 in 200), allergic reaction [possibly serious] (1 in 200).    Active Problems:   Implantable cardioverter-defibrillator (ICD) in situ has been seen and evaluated by Dr. Caryl Comes  this AM   Atrial flutter with rapid ventricular response (HCC)/ Atrial tach on amiodarone, continue for now   CM--new EF 35%-40% on lasix 40 mg daily   Chronic systolic CHF (congestive heart failure) (HCC) few rales   Atrial tachycardia ([place]) pacing now.   CAD with hx of CABG and Re-do CABG last cath 2015  total occlusion of left  main and RCA. Patent LIMA to LAD, patent SVG to OM, patent SVG to RCA. Of note, diffuse disease in LAD both before and after LIMA graft insertion  AS with TAVR in 2015 and valve stable on echo 2019.     For questions or updates, please contact [place] Please consult [email or web address] for contact info under        Signed, Cecilie Kicks, NP  01/22/2018, 9:05 AM    ---------------------------------------------------------   History and all data above reviewed.  Patient examined.  I agree with the findings as above.  [name] is feeling well, no throat tightness, and ready for angiography.  GEN: Well nourished, well developed in no acute distress HEENT: Normal NECK: No JVD; No carotid bruits CARDIAC: The rate and rhythm but tachycardic, no murmurs, rubs, gallops RESPIRATORY: Clear to auscultation without rales, wheezing or rhonchi  ABDOMEN: Soft, non-tender, non-distended MUSCULOSKELETAL: No edema; No deformity  SKIN: Warm and dry NEUROLOGIC: Alert and oriented x 3 PSYCHIATRIC: Normal affect   All available labs, radiology testing, previous records reviewed. Agree with documented assessment and plan of my colleague as stated above with the following additions or changes:  Principal Problem:   Non-ST elevation (NSTEMI) myocardial infarction [facility]) Active Problems:   Implantable cardioverter-defibrillator (ICD) in situ   Atrial flutter with rapid ventricular response (HCC)   Chronic systolic CHF (congestive heart failure) (HCC)   Atrial tachycardia ([place])    Plan:  His troponin was significantly elevated so we will plan for coronary  angiography today-update: Coronary angiography performed by Dr. Ellyn Hack demonstrates stable coronary artery disease.  No intervention performed.  Dr. Caryl Comes in electrophysiology was able to adjust his device settings, and his ventricular rate is appropriate in the 60s and 70s currently with adequate blood pressure.  We will seek the input of our EP colleagues as to whether amiodarone should be continued for atrial tachycardia home-going, my suspicion would be that it is not entirely necessary after hospital dismissal given appropriate adjustments to device settings.  We can consider hospital dismissal tomorrow depending on his clinical status.  Elouise Munroe, MD [facility] 11:17 PM  01/22/2018

## 2018-01-22 NOTE — Interval H&P Note (Signed)
History and Physical Interval Note:  01/22/2018 3:14 PM  [name]  has presented today for surgery, with the diagnosis of NSTEMI.   The various methods of treatment have been discussed with the patient and family. After consideration of risks, benefits and other options for treatment, the patient has consented to  Procedure(s): LEFT HEART CATH AND CORS/GRAFTS ANGIOGRAPHY (N/A) with possible PERCUTANEOUS CORONARY INTERVENTION as a surgical intervention .  The patient's history has been reviewed, patient examined, no change in status, stable for surgery.  I have reviewed the patient's chart and labs.  Questions were answered to the patient's satisfaction.    Cath Lab Visit (complete for each Cath Lab visit)  Clinical Evaluation Leading to the Procedure:   ACS: Yes.    Non-ACS:    Anginal Classification: CCS IV  Anti-ischemic medical therapy: Maximal Therapy (2 or more classes of medications)  Non-Invasive Test Results: No non-invasive testing performed  Prior CABG: Previous CABG    Glenetta Hew

## 2018-01-22 NOTE — Progress Notes (Signed)
[place] for heparin Indication: atrial fibrillation  Allergies  Allergen Reactions  . Cyclobenzaprine Hcl Other (See Comments)    Unknown reaction  . Tetracycline Rash         Patient Measurements: Height: 5' 6.5" (168.9 cm) Weight: 153 lb (69.4 kg) IBW/kg (Calculated) : 64.95 Heparin Dosing Weight: 70kg  Vital Signs: Temp: 98.7 F (37.1 C) (10/16 0754) Temp Source: Oral (10/16 0754) BP: 131/60 (10/16 0754) Pulse Rate: 62 (10/16 0540)  Labs: Recent Labs    01/20/18 1745 01/20/18 2359 01/21/18 0447 01/21/18 1100 01/21/18 1409 01/22/18 0745  HGB 10.5*  --  8.9*  --  10.5* 9.1*  HCT 34.8*  --  28.6*  --  35.5* 29.3*  PLT 159  --  150  --   --  145*  APTT  --   --   --   --   --  102*  LABPROT  --  15.8*  --   --   --   --   INR  --  1.27  --   --   --   --   HEPARINUNFRC  --   --   --   --   --  1.62*  CREATININE 1.92*  --  1.85*  --   --  1.70*  TROPONINI  --  1.25* 5.34* 15.36*  --   --     Estimated Creatinine Clearance: 31.3 mL/min (A) (by C-G formula based on SCr of 1.7 mg/dL (H)).   Medical History: Past Medical History:  Diagnosis Date  . Aortic stenosis, severe    s/p TAVR with Edwards Sapien 3 THV (size 26 mm, model # 9600TFX, serial # [id number])  . Arthritis   . Cardiac arrest ([place])   . Chronic systolic CHF (congestive heart failure) ([place]) 01/21/2018  . Claudication ([place])   . Coronary artery disease   . Essential hypertension   . Headache   . History of hiatal hernia   . Hyperlipidemia   . Kidney stones   . Non-ST elevation (NSTEMI) myocardial infarction ([place]) 01/21/2018  . Peripheral vascular disease ([place])      Assessment: 54 yoM admitted with palpitations and CP now with rising troponins. Pt on Xarelto PTA but now to transition to IV heparin. Last dose of Xarelto was 10/14 at 2349. Plans for cath today -aPTT is 102 and at goal; heparin level elevated due to recent xarelto  Goal of Therapy:   Heparin level 0.3-0.7 units/ml aPTT 66-102 seconds Monitor platelets by anticoagulation protocol: Yes   Plan:  -No heparin changes needed -Daily heparin level and CBC  Hildred Laser, PharmD Clinical Pharmacist Please check Amion for pharmacy contact number

## 2018-01-23 ENCOUNTER — Encounter (HOSPITAL_COMMUNITY): Payer: Self-pay | Admitting: Cardiology

## 2018-01-23 ENCOUNTER — Other Ambulatory Visit: Payer: Self-pay | Admitting: Cardiology

## 2018-01-23 DIAGNOSIS — I5021 Acute systolic (congestive) heart failure: Secondary | ICD-10-CM

## 2018-01-23 DIAGNOSIS — E876 Hypokalemia: Secondary | ICD-10-CM

## 2018-01-23 DIAGNOSIS — Z9889 Other specified postprocedural states: Secondary | ICD-10-CM

## 2018-01-23 HISTORY — DX: Other specified postprocedural states: Z98.890

## 2018-01-23 LAB — BASIC METABOLIC PANEL
Anion gap: 11 (ref 5–15)
BUN: 20 mg/dL (ref 8–23)
CO2: 20 mmol/L — ABNORMAL LOW (ref 22–32)
CREATININE: 1.66 mg/dL — AB (ref 0.61–1.24)
Calcium: 9.8 mg/dL (ref 8.9–10.3)
Chloride: 108 mmol/L (ref 98–111)
GFR calc Af Amer: 43 mL/min — ABNORMAL LOW (ref >60)
GFR, EST NON AFRICAN AMERICAN: 37 mL/min — AB (ref >60)
GLUCOSE: 172 mg/dL — AB (ref 70–99)
Potassium: 4.3 mmol/L (ref 3.5–5.1)
SODIUM: 139 mmol/L (ref 135–145)

## 2018-01-23 LAB — CBC
HCT: 30.6 % — ABNORMAL LOW (ref 39.0–52.0)
Hemoglobin: 9.7 g/dL — ABNORMAL LOW (ref 13.0–17.0)
MCH: 27.9 pg (ref 26.0–34.0)
MCHC: 31.7 g/dL (ref 30.0–36.0)
MCV: 87.9 fL (ref 80.0–100.0)
PLATELETS: 153 10*3/uL (ref 150–400)
RBC: 3.48 MIL/uL — ABNORMAL LOW (ref 4.22–5.81)
RDW: 13.7 % (ref 11.5–15.5)
WBC: 8.6 10*3/uL (ref 4.0–10.5)
nRBC: 0 % (ref 0.0–0.2)

## 2018-01-23 LAB — APTT: APTT: 71 s — AB (ref 24–36)

## 2018-01-23 MED ORDER — LISINOPRIL 10 MG PO TABS: 10.0000 mg | ORAL_TABLET | Freq: Every day | ORAL | 1 refills | Status: DC

## 2018-01-23 MED ORDER — LISINOPRIL 10 MG PO TABS: 10.0000 mg | ORAL_TABLET | Freq: Every day | ORAL | Status: DC

## 2018-01-23 MED ORDER — AMIODARONE HCL 200 MG PO TABS: 200.0000 mg | ORAL_TABLET | Freq: Every day | ORAL | 6 refills | Status: DC

## 2018-01-23 MED ORDER — AMIODARONE HCL 200 MG PO TABS
200.0000 mg | ORAL_TABLET | Freq: Every day | ORAL | Status: DC
  Administered 2018-01-23: 200 mg via ORAL
  Filled 2018-01-23: qty 1

## 2018-01-23 NOTE — Discharge Instructions (Signed)
Call [facility] at [phone] if any bleeding, swelling or drainage at cath site.  May shower, no tub baths for 48 hours for groin sticks. No lifting over 5 pounds for 3 days.  No Driving for 5 days if you drive  Heart Healthy diet  Begin xarelto tonight with meal.   New medication amiodarone 1 daily to prevent tachycardia.  Have lab work done on Tuesday the 22nd at [address] office.     We decreased your lisinopril to 10 mg for now, cal [place] if your BP at home increases.

## 2018-01-23 NOTE — Progress Notes (Signed)
Discharge instructions (including medications) discussed with and copy provided to patient/caregiver 

## 2018-01-23 NOTE — Care Management Important Message (Signed)
Important Message  Patient Details  Name: [name] MRN: [id number] Date of Birth: March 05, 1934   Medicare Important Message Given:  Yes    [name] [name] 01/23/2018, 11:42 AM

## 2018-01-23 NOTE — Discharge Summary (Signed)
Discharge Summary    Patient ID: [name] MRN: [id number]; DOB: Jul 03, 1933  Admit date: 01/20/2018 Discharge date: 01/23/2018  Primary Care Provider: Binnie Rail, MD  Primary Cardiologist: Lauree Chandler, MD  Primary Electrophysiologist:  Virl Axe, MD   Discharge Diagnoses    Principal Problem:   Non-ST elevation (NSTEMI) myocardial infarction [facility]) Active Problems:   S/P cardiac cath 01/22/18 stable -treating medically    Implantable cardioverter-defibrillator (ICD) in situ   Hypercholesteremia   Atrial flutter with rapid ventricular response (HCC)   Chronic systolic CHF (congestive heart failure) (HCC)   Atrial tachycardia (HCC)   Allergies Allergies  Allergen Reactions  . Cyclobenzaprine Hcl Other (See Comments)    Unknown reaction  . Tetracycline Rash         Diagnostic Studies/Procedures    Echo 01/21/18  ______Study Conclusions  - Left ventricle: The cavity size was normal. Wall thickness was   normal. Systolic function was moderately reduced. The estimated   ejection fraction was in the range of 35% to 40%. Severe   hypokinesis of the inferolateral myocardium; consistent with   ischemia or infarction in the distribution of the left circumflex   coronary artery. The study is not technically sufficient to allow   evaluation of LV diastolic function. - Ventricular septum: Septal motion showed dyssynergy and paradox.   These changes are consistent with right ventricular pacing. - Aortic valve: A stent-valve (TAVR) bioprosthesis was present and   functioning normally. Valve area (VTI): 1.47 cm^2. Valve area   (Vmax): 1.52 cm^2. Valve area (Vmean): 1.52 cm^2. - Mitral valve: Calcified annulus. There was mild regurgitation. - Left atrium: The atrium was severely dilated. - Right atrium: The atrium was mildly dilated.  Impressions:  - The rhythm appears to be atrial flutter/atrial tachycardia with   demand ventricular pacing (failure  to detect arrhythmia?). There   is profound apex-o-base and septal-lateral RV-pacing induced   dyssynchrony, that appears to be largely responsible for the drop   in LV systolic function.   Mid LM to Prox LAD lesion is 100% stenosed with 100% stenosed side branch in Ost Cx to Dist Cx. Known CTO  SVG-OM graft was visualized by angiography and is large. The graft exhibits minimal luminal irregularities.  Origin to Prox Graft stent has 5% in-stent restenosis. Mid Graft to Dist Graft stent has 5% in-stent restenosis.  LIMA graft was visualized by angiography and is normal in caliber. The graft exhibits no disease. The graft insertion to the mid LAD.  Prox LAD to Mid LAD lesion is 90% stenosed -retrograde from LIMA insertion. (Stable).  Mid LAD to Dist LAD lesion is 90% stenosed-antegrade from LIMA insertion. (Stable).  Ost RCA to Dist RCA lesion is 100% stenosed. Known CTO  ---------------------------------------------  SVG-dRCA graft was visualized by angiography and is large. The graft exhibits minimal luminal irregularities.  Dist RCA lesion is 40% stenosed at the graft insertion site.  ---------------------------------------------   Known severe native CAD with occluded RCA and left main. Patent SVG-d RCA, SVG-OM, LIMA-LAD. Chronic/stable severe disease with retrograde and antegrade from LIMA insertion.  No obvious culprit lesion to explain non-STEMI, suggest demand ischemia in the setting of A. fib RVR with LAD lesions leading to LAD ischemia.  Plan: Return to nursing unit for ongoing care.  Will restart IV heparin 8 hours after closure. Would continue current management per primary cardiology service.   Recommend to resume Rivaroxaban, at currently prescribed dose and frequency, on Evening of 01/23/2018.  Concurrent aspirin  81 mg. _______   History of Present Illness     84 y.o. male with Hx of CABG in 1982 and redo in 1994,  ICM and out of hospital cardiac arrest  with successful resuscitation and MDT ICD.  And PAF Then AS and TAVR 2015.  Also PAF.  On Xarelto.  Pt presented to ER 01/20/18 with fullness in throat and his fitbit told him his HR was 124.  This was unusual for him.  He came to ER for eval as the throat tightness had previously been cardiac angina.     Pt was found to be in atrial tach with HR 124.  BP was soft.  Pt was also V pacing.  Device was remotely checked.  IV amiodarone was started and xarelto was continued.  He had no further chest pain.  He was admitted to stepdown and serial troponins ordered.    Hospital Course     Consultants: Dr. Caryl Comes EP  By the next AM pt was stable. Though troponin 1.25 on second draw; 5.34 and pk of 15.36.  LDL was 58.   Hgb 8.9.  plts 150.   Hgb A1c 6.2.  TSH 2.398  LFTs WNL  Echo was done with decrease in EF 35-40%.  Severe   hypokinesis of the inferolateral myocardium; consistent with   ischemia or infarction in the distribution of the left circumflex   coronary artery. The study is not technically sufficient to allow   evaluation of LV diastolic function.  AND ON ECHO A FLUTTER /ATRIAL TACHYCARDIA with demand ventricular pacing (failure to detect arrhythmia?). There is profound apex-o-base and septal-lateral RV-pacing induced dyssynchrony, that appears to be largely responsible for the drop   in LV systolic function.  Pacemaker was again interrogated with Dr. Caryl Comes and adjustments were made.    With drop in EF pt underwent cardiac cath with results as above.  He did well.  IV amiodarone was changed to po and he will resume Xarelto tonight with supper.     He will have BMP on the 22nd and see Dr. Radford Pax for sleep on the 30Th.  He was seen and evaluated by Dr. Lavonna Monarch today and found stable for discharge.     _____________  Discharge Vitals Blood pressure (!) 135/54, pulse 64, temperature 98.5 F (36.9 C), temperature source Oral, resp. rate 18, height 5' 6.5" (1.689 m), weight 69.4 kg, SpO2 96 %.   Filed Weights   01/20/18 2250 01/22/18 0540  Weight: 70.4 kg 69.4 kg    Labs & Radiologic Studies    CBC Recent Labs    01/22/18 0745 01/23/18 1000  WBC 6.2 8.6  HGB 9.1* 9.7*  HCT 29.3* 30.6*  MCV 88.8 87.9  PLT 145* 627   Basic Metabolic Panel Recent Labs    01/20/18 1745 01/20/18 2359  01/22/18 0745 01/23/18 1000  NA 140  --    < > 140 139  K 4.1  --    < > 3.9 4.3  CL 111  --    < > 109 108  CO2 22  --    < > 23 20*  GLUCOSE 146*  --    < > 122* 172*  BUN 43*  --    < > 26* 20  CREATININE 1.92*  --    < > 1.70* 1.66*  CALCIUM 10.1  --    < > 9.5 9.8  MG 2.5* 2.3  --   --   --    < > =  values in this interval not displayed.   Liver Function Tests Recent Labs    01/20/18 1745 01/20/18 2359  AST 22 29  ALT 14 14  ALKPHOS 48 40  BILITOT 0.5 0.5  PROT 7.0 6.6  ALBUMIN 4.0 3.6   No results for input(s): LIPASE, AMYLASE in the last 72 hours. Cardiac Enzymes Recent Labs    01/20/18 2359 01/21/18 0447 01/21/18 1100  TROPONINI 1.25* 5.34* 15.36*   BNP Invalid input(s): POCBNP D-Dimer No results for input(s): DDIMER in the last 72 hours. Hemoglobin A1C Recent Labs    01/20/18 2359  HGBA1C 6.2*   Fasting Lipid Panel Recent Labs    01/21/18 0447  CHOL 130  HDL 30*  LDLCALC 58  TRIG 209*  CHOLHDL 4.3   Thyroid Function Tests Recent Labs    01/20/18 2359  TSH 2.398   _____________  Dg Chest Portable 1 View  Result Date: 01/20/2018 CLINICAL DATA:  84 year old male with a history of neck tightness EXAM: PORTABLE CHEST 1 VIEW COMPARISON:  03/13/2017 FINDINGS: Cardiomediastinal silhouette unchanged in size and contour. No evidence of central vascular congestion. No pneumothorax or pleural effusion. Surgical changes of prior median sternotomy and CABG. Surgical changes of prior trans catheter aortic valve replacement. Cardiac pacing device/AICD unchanged. No confluent airspace disease. IMPRESSION: Chronic lung changes without evidence of acute  cardiopulmonary disease. Surgical changes of prior median sternotomy and CABG, TAVR, and cardiac AICD Electronically Signed   By: Corrie Mckusick D.O.   On: 01/20/2018 18:17   Disposition   Pt is being discharged home today in good condition.  Follow-up Plans & Appointments    Follow-up Information    Deboraha Sprang, MD Follow up on 02/18/2018.   Specialty:  Cardiology Why:  at 2:30PM  Contact information: [address] [address] [phone]        Burnell Blanks, MD Follow up.   Specialty:  Cardiology Why:  Routine cardiology appointment on 05/15/17 at 10:20. Please arrive 15 minutes early for check in.  Contact information: [address] [place] [address] [phone]        [facility] [address] Follow up.   Specialty:  Cardiology Why:  Echocardiogram to be done on 02/10/18 at 11:30. Please arrive at 11:00 for check in.  Contact information: [address] [place] [phone]       Sueanne Margarita, MD Follow up on 02/05/2018.   Specialty:  Cardiology Why:  at 1300 Contact information: [place]. [address] [place] [address] [phone]           Call [facility] at [phone] if any bleeding, swelling or drainage at cath site.  May shower, no tub baths for 48 hours for groin sticks. No lifting over 5 pounds for 3 days.  No Driving for 5 days if you drive  Heart Healthy diet  Begin xarelto tonight with meal.   New medication amiodarone 1 daily to prevent tachycardia.  Have lab work done on Tuesday the 22nd at [address] office.     We decreased your lisinopril to 10 mg for now, cal [place] if your BP at home increases.     Discharge Medications     Allergies as of 01/23/2018      Reactions   Cyclobenzaprine Hcl Other (See Comments)   Unknown reaction   Tetracycline Rash  Medication List      STOP taking these medications   PRESCRIPTION MEDICATION     TAKE these medications   amiodarone 200 MG tablet Commonly known as:  PACERONE Take 1 tablet (200 mg total) by mouth daily. Start taking on:  01/24/2018   amLODipine 5 MG tablet Commonly known as:  NORVASC TAKE 1 TABLET BY MOUTH DAILY.   amoxicillin 500 MG tablet Commonly known as:  AMOXIL Take 4 tablets by mouth 60 minutes prior to dental appointments What changed:    how much to take  how to take this  when to take this  additional instructions   diazepam 5 MG tablet Commonly known as:  VALIUM Take 1 tablet (5 mg total) by mouth daily as needed. What changed:    when to take this  reasons to take this   furosemide 40 MG tablet Commonly known as:  LASIX Take 1 tablet (40 mg total) by mouth daily.   ketotifen 0.025 % ophthalmic solution Commonly known as:  ZADITOR Place 1 drop into both eyes daily as needed (seasonal allergies).   KRILL OIL PO Take 1 capsule by mouth at bedtime.   latanoprost 0.005 % ophthalmic solution Commonly known as:  XALATAN Place 1 drop into both eyes at bedtime.   lisinopril 10 MG tablet Commonly known as:  PRINIVIL,ZESTRIL Take 1 tablet (10 mg total) by mouth daily. Start taking on:  01/24/2018 What changed:    medication strength  how much to take   MAG-OXIDE PO Take 1 tablet by mouth at bedtime.   Melatonin 1 MG Tabs Take 2-3 mg by mouth at bedtime as needed (sleep).   metoprolol tartrate 50 MG tablet Commonly known as:  LOPRESSOR TAKE 1/2 TABLET BY MOUTH EVERY EVENING. What changed:    when to take this  Another medication with the same name was removed. Continue taking this medication, and follow the directions you see here.   multivitamin with minerals Tabs tablet Take 1 tablet by mouth daily.   nitroGLYCERIN 0.4 MG SL tablet Commonly known as:  NITROSTAT Place 1 tablet (0.4 mg total) under the tongue every 5 (five) minutes as needed for chest  pain (for chest pain (MAX of 3 doses)).   Potassium 99 MG Tabs Take 99 mg by mouth 2 (two) times daily.   QUNOL ULTRA COQ10 PO Take 10-15 mLs by mouth at bedtime.   rosuvastatin 40 MG tablet Commonly known as:  CRESTOR TAKE 1 TABLET BY MOUTH DAILY. What changed:  when to take this   SYSTANE COMPLETE 0.6 % Soln Generic drug:  Propylene Glycol Place 1 drop into both eyes daily as needed (dry eyes).   thiamine 100 MG tablet Commonly known as:  VITAMIN B-1 Take 100 mg by mouth at bedtime.   trospium 20 MG tablet Commonly known as:  SANCTURA Take 20 mg by mouth 2 (two) times daily.   TUMS PO Take 2 tablets by mouth 2 (two) times daily as needed (indigestion/ acid reflux).   XARELTO 20 MG Tabs tablet Generic drug:  rivaroxaban TAKE 1 TABLET BY MOUTH DAILY WITH SUPPER What changed:  See the new instructions.        Acute coronary syndrome (MI, NSTEMI, STEMI, etc) this admission?:  No.  The elevated Troponin was due to the acute medical illness or demand ischemia.    Outstanding Labs/Studies   BMP on 01/28/18  Duration of Discharge Encounter   Greater than 30 minutes including physician time.  Signed, Cecilie Kicks,  NP 01/23/2018, 11:17 AM

## 2018-01-23 NOTE — Progress Notes (Signed)
Progress Note  Patient Name: [name] Date of Encounter: 01/23/2018  Primary Cardiologist: Lauree Chandler, MD  EP Caryl Comes  Subjective   No chest pain or SOB, ready to go home.  Inpatient Medications    Scheduled Meds: . furosemide  40 mg Oral Daily  . latanoprost  1 drop Both Eyes QHS  . magnesium oxide  200 mg Oral QHS  . metoprolol tartrate  25 mg Oral BID  . potassium chloride  10 mEq Oral BID  . rosuvastatin  40 mg Oral Daily  . sodium chloride flush  3 mL Intravenous Q12H  . thiamine  100 mg Oral Daily   Continuous Infusions: . sodium chloride    . amiodarone 30 mg/hr (01/23/18 0154)  . amiodarone 150 mg (01/21/18 1646)  . heparin 950 Units/hr (01/23/18 0145)   PRN Meds: sodium chloride, acetaminophen, amiodarone, calcium carbonate, diazepam, naphazoline-pheniramine, nitroGLYCERIN, ondansetron (ZOFRAN) IV, sodium chloride flush   Vital Signs    Vitals:   01/22/18 2036 01/22/18 2132 01/22/18 2328 01/23/18 0359  BP: (!) 83/66 (!) 121/96 (!) 112/56 (!) 149/55  Pulse:  (!) 58    Resp: 17  16 (!) 22  Temp: (!) 96.2 F (35.7 C)  (!) 97 F (36.1 C) 98.5 F (36.9 C)  TempSrc: Axillary  Oral Oral  SpO2:      Weight:      Height:        Intake/Output Summary (Last 24 hours) at 01/23/2018 0816 Last data filed at 01/23/2018 0700 Gross per 24 hour  Intake 1309.34 ml  Output 950 ml  Net 359.34 ml   Filed Weights   01/20/18 2250 01/22/18 0540  Weight: 70.4 kg 69.4 kg    ECG    Atrial tach - Personally Reviewed  Physical Exam   GEN: Well nourished, well developed in no acute distress HEENT: Normal NECK: No JVD; No carotid bruits CARDIAC: The rate and rhythm but tachycardic, no murmurs, rubs, gallops RESPIRATORY: Clear to auscultation without rales, wheezing or rhonchi  ABDOMEN: Soft, non-tender, non-distended MUSCULOSKELETAL: No edema; No deformity  SKIN: Warm and dry NEUROLOGIC: Alert and oriented x 3 PSYCHIATRIC: Normal affect   Labs    Chemistry Recent Labs  Lab 01/20/18 1745 01/20/18 2359 01/21/18 0447 01/22/18 0745  NA 140  --  143 140  K 4.1  --  4.0 3.9  CL 111  --  112* 109  CO2 22  --  21* 23  GLUCOSE 146*  --  138* 122*  BUN 43*  --  38* 26*  CREATININE 1.92*  --  1.85* 1.70*  CALCIUM 10.1  --  9.7 9.5  PROT 7.0 6.6  --   --   ALBUMIN 4.0 3.6  --   --   AST 22 29  --   --   ALT 14 14  --   --   ALKPHOS 48 40  --   --   BILITOT 0.5 0.5  --   --   GFRNONAA 31*  --  33* 36*  GFRAA 36*  --  38* 42*  ANIONGAP 7  --  10 8     Hematology Recent Labs  Lab 01/20/18 1745 01/21/18 0447 01/21/18 1409 01/22/18 0745  WBC 7.6 9.3  --  6.2  RBC 3.82* 3.20*  --  3.30*  HGB 10.5* 8.9* 10.5* 9.1*  HCT 34.8* 28.6* 35.5* 29.3*  MCV 91.1 89.4  --  88.8  MCH 27.5 27.8  --  27.6  MCHC 30.2 31.1  --  31.1  RDW 13.7 13.6  --  13.8  PLT 159 150  --  145*    Cardiac Enzymes Recent Labs  Lab 01/20/18 2359 01/21/18 0447 01/21/18 1100  TROPONINI 1.25* 5.34* 15.36*    Recent Labs  Lab 01/20/18 1800  TROPIPOC 0.00     BNP Recent Labs  Lab 01/20/18 2102  BNP 951.6*     DDimer No results for input(s): DDIMER in the last 168 hours.   Radiology    No results found.  Cardiac Studies   Echo 01/21/18 Study Conclusions  - Left ventricle: The cavity size was normal. Wall thickness was   normal. Systolic function was moderately reduced. The estimated   ejection fraction was in the range of 35% to 40%. Severe   hypokinesis of the inferolateral myocardium; consistent with   ischemia or infarction in the distribution of the left circumflex   coronary artery. The study is not technically sufficient to allow   evaluation of LV diastolic function. - Ventricular septum: Septal motion showed dyssynergy and paradox.   These changes are consistent with right ventricular pacing. - Aortic valve: A stent-valve (TAVR) bioprosthesis was present and   functioning normally. Valve area (VTI): 1.47  cm^2. Valve area   (Vmax): 1.52 cm^2. Valve area (Vmean): 1.52 cm^2. - Mitral valve: Calcified annulus. There was mild regurgitation. - Left atrium: The atrium was severely dilated. - Right atrium: The atrium was mildly dilated.  Impressions:  - The rhythm appears to be atrial flutter/atrial tachycardia with   demand ventricular pacing (failure to detect arrhythmia?). There   is profound apex-o-base and septal-lateral RV-pacing induced   dyssynchrony, that appears to be largely responsible for the drop   in LV systolic function.   Patient Profile     84 y.o. male with Hx of CABG in 1982 and redo in 1994,  ICM and out of hospital cardiac arrest with successful resuscitation and MDT ICD.  And PAF Then AS and TAVR 2015.  Now admitted with throat tightness his angina, and tachycardia Assessment & Plan    Principal Problem:   Non-ST elevation (NSTEMI) myocardial infarction [facility]) Active Problems:   Implantable cardioverter-defibrillator (ICD) in situ   Atrial flutter with rapid ventricular response (HCC)   Chronic systolic CHF (congestive heart failure) (HCC)   Atrial tachycardia (HCC)  Plan:  Coronary angiography performed by Dr. Ellyn Hack demonstrates stable coronary artery disease.  No intervention performed.  Dr. Caryl Comes in electrophysiology was able to adjust his device settings, and his ventricular rate is appropriate in the 60s and 70s currently with adequate blood pressure.  I will plan to continue amiodarone for the short term for atrial tachycardia.  Plan for hospital dismissal today with the following recommendations:  - start amiodarone 200 mg daily until EP follow up - continue amlodipine, furosemide, lisinopril, metoprolol, rosuvastatin, and xarelto - follow up BMET and CBC prior to hospital dismissal.  Elouise Munroe, MD [facility] 8:16 AM  01/23/2018

## 2018-01-23 NOTE — Plan of Care (Signed)

## 2018-01-24 ENCOUNTER — Telehealth: Payer: Self-pay | Admitting: *Deleted

## 2018-01-24 NOTE — Telephone Encounter (Signed)
Pt was on TCM report admitted 01/20/18 evaluation for fullness in throat and HR being elevated at 124. Pt was also V pacing, and his device was remotely checked admitted to stepdown and serial troponins ordered.  Pacemaker was interrogated by Dr. Caryl Comes and adjustments were made. Pt D/C 01/23/18, and will follow-up w/cardiology on 02/05/18.Marland KitchenJohny Chess

## 2018-01-28 ENCOUNTER — Other Ambulatory Visit: Payer: Medicare Other

## 2018-01-28 DIAGNOSIS — E876 Hypokalemia: Secondary | ICD-10-CM

## 2018-01-28 LAB — BASIC METABOLIC PANEL
BUN / CREAT RATIO: 17 (ref 10–24)
BUN: 32 mg/dL — AB (ref 8–27)
CALCIUM: 9.9 mg/dL (ref 8.6–10.2)
CO2: 20 mmol/L (ref 20–29)
Chloride: 105 mmol/L (ref 96–106)
Creatinine, Ser: 1.83 mg/dL — ABNORMAL HIGH (ref 0.76–1.27)
GFR calc Af Amer: 39 mL/min/{1.73_m2} — ABNORMAL LOW (ref >59)
GFR, EST NON AFRICAN AMERICAN: 34 mL/min/{1.73_m2} — AB (ref >59)
Glucose: 96 mg/dL (ref 65–99)
Potassium: 4.9 mmol/L (ref 3.5–5.2)
Sodium: 140 mmol/L (ref 134–144)

## 2018-02-05 ENCOUNTER — Encounter: Payer: Self-pay | Admitting: Cardiology

## 2018-02-05 ENCOUNTER — Ambulatory Visit (INDEPENDENT_AMBULATORY_CARE_PROVIDER_SITE_OTHER): Payer: Medicare Other | Admitting: Cardiology

## 2018-02-05 VITALS — BP 106/82 | HR 62 | Ht 66.5 in | Wt 157.0 lb

## 2018-02-05 DIAGNOSIS — G4733 Obstructive sleep apnea (adult) (pediatric): Secondary | ICD-10-CM | POA: Diagnosis not present

## 2018-02-05 DIAGNOSIS — I255 Ischemic cardiomyopathy: Secondary | ICD-10-CM | POA: Diagnosis not present

## 2018-02-05 DIAGNOSIS — I119 Hypertensive heart disease without heart failure: Secondary | ICD-10-CM

## 2018-02-05 HISTORY — DX: Obstructive sleep apnea (adult) (pediatric): G47.33

## 2018-02-05 NOTE — Progress Notes (Signed)
Cardiology Office Note:    Date:  02/05/2018   ID:  [name], DOB 10/11/1933, MRN [id number]  PCP:  Binnie Rail, MD  Cardiologist:  Lauree Chandler, MD    Referring MD: Binnie Rail, MD   Chief Complaint  Patient presents with  . Sleep Apnea  . Hypertension    History of Present Illness:    [name] is a 84 y.o. male with a hx of severe aortic stenosis status post TAVR, hypertension, hyperlipidemia and chronic systolic heart failure who was referred by Dr. Angelena Form for evaluation for obstructive sleep apnea due to excessive daytime sleepiness, fatigue and witnessed apneas during sleep.  His Epworth Sleepiness Scale is elevated at 19.  He went sleep study showing severe obstructive sleep apnea with an AHI of 36.8/h with no significant central sleep apnea.  He had oxygen desaturations as low as 79%.  He underwent CPAP titration to 5 cm H2O and he is now here for further evaluation.  He is doing well with his CPAP device and thinks that he has gotten used to it.  He tolerates the mask and feels the pressure is adequate.  Since going on CPAP he things he may feel slightly more rested in the morning but he still is napping quite a bit during the day according to his wife.  He does get up 1-2 times a night to urinate and sometimes has a hard time falling back asleep.  He occasionally will have some problems with dry mouth but no dryness in the nose.  He does use distilled water and humidity and his device.Marland Kitchen  He does not think that he snores.     Past Medical History:  Diagnosis Date  . Aortic stenosis, severe    s/p TAVR with Edwards Sapien 3 THV (size 26 mm, model # 9600TFX, serial # [id number])  . Arthritis   . Cardiac arrest ([place])   . Chronic systolic CHF (congestive heart failure) ([place]) 01/21/2018  . Claudication ([place])   . Coronary artery disease   . Essential hypertension   . Headache   . History of hiatal hernia   . Hyperlipidemia   . Kidney stones   . Non-ST  elevation (NSTEMI) myocardial infarction ([place]) 01/21/2018  . OSA (obstructive sleep apnea) 02/05/2018   Severe obstructive sleep apnea with an AHI of 36.8/h with no significant central sleep apnea.   Now on CPAP at  5 cm H2O  . Peripheral vascular disease ([place])   . S/P cardiac cath 01/22/18  01/23/2018    Past Surgical History:  Procedure Laterality Date  . ADENOIDECTOMY    . APPENDECTOMY    . CARDIAC CATHETERIZATION   11/16/2009   . CARDIAC DEFIBRILLATOR PLACEMENT    . cataract surgery      Bilateral cataract surgery  . CORONARY ARTERY BYPASS GRAFT     in 1982 and '84 with subsequent PTCI  . EP IMPLANTABLE DEVICE N/A 03/21/2016   Procedure: ICD Generator Changeout;  Surgeon: Deboraha Sprang, MD;  Location: [place] [facility];  Service: Cardiovascular;  Laterality: N/A;  . HERNIA REPAIR    . LEFT AND RIGHT HEART CATHETERIZATION WITH CORONARY/GRAFT ANGIOGRAM N/A 02/01/2014   Procedure: LEFT AND RIGHT HEART CATHETERIZATION WITH Beatrix Fetters;  Surgeon: Sinclair Grooms, MD;  Location: [facility] CATH LAB;  Service: Cardiovascular;  Laterality: N/A;  . left elbow tendon surgery    . LEFT HEART CATH AND CORS/GRAFTS ANGIOGRAPHY N/A 01/22/2018   Procedure: LEFT HEART  CATH AND CORS/GRAFTS ANGIOGRAPHY;  Surgeon: Leonie Man, MD;  Location: [place] [facility];  Service: Cardiovascular;  Laterality: N/A;  . TEE WITHOUT CARDIOVERSION N/A 03/23/2014   Procedure: TRANSESOPHAGEAL ECHOCARDIOGRAM (TEE);  Surgeon: Burnell Blanks, MD;  Location: [place];  Service: Open Heart Surgery;  Laterality: N/A;  . TONSILLECTOMY    . TRANSCATHETER AORTIC VALVE REPLACEMENT, TRANSFEMORAL N/A 03/23/2014   Procedure: TRANSCATHETER AORTIC VALVE REPLACEMENT, TRANSFEMORAL;  Surgeon: Burnell Blanks, MD;  Location: [place];  Service: Open Heart Surgery;  Laterality: N/A;    Current Medications: No outpatient medications have been marked as taking for the 02/05/18 encounter (Office Visit) with Sueanne Margarita, MD.     Allergies:   Cyclobenzaprine hcl and Tetracycline   Social History   Socioeconomic History  . Marital status: Married    Spouse name: Not on file  . Number of children: 3  . Years of education: Not on file  . Highest education level: Not on file  Occupational History  . Occupation: Retired  Scientific laboratory technician  . Financial resource strain: Not hard at all  . Food insecurity:    Worry: Never true    Inability: Never true  . Transportation needs:    Medical: No    Non-medical: No  Tobacco Use  . Smoking status: Former Smoker    Last attempt to quit: 10/25/1952    Years since quitting: 65.3  . Smokeless tobacco: Never Used  Substance and Sexual Activity  . Alcohol use: Yes    Alcohol/week: 5.0 - 6.0 standard drinks    Types: 5 - 6 Glasses of wine per week    Comment: 5-6 times a week  . Drug use: No  . Sexual activity: Never  Lifestyle  . Physical activity:    Days per week: Not on file    Minutes per session: Not on file  . Stress: Only a little  Relationships  . Social connections:    Talks on phone: More than three times a week    Gets together: More than three times a week    Attends religious service: More than 4 times per year    Active member of club or organization: Not on file    Attends meetings of clubs or organizations: More than 4 times per year    Relationship status: Married  Other Topics Concern  . Not on file  Social History Narrative  . Not on file     Family History: The patient's family history includes Coronary artery disease in his other; Heart attack (age of onset: 45) in his father.  ROS:   Please see the history of present illness.    ROS  All other systems reviewed and negative.   EKGs/Labs/Other Studies Reviewed:    The following studies were reviewed today: PAP download  EKG:  EKG is not ordered today.    Recent Labs: 01/20/2018: ALT 14; B Natriuretic Peptide 951.6; Magnesium 2.3; TSH 2.398 01/23/2018: Hemoglobin  9.7; Platelets 153 01/28/2018: BUN 32; Creatinine, Ser 1.83; Potassium 4.9; Sodium 140   Recent Lipid Panel    Component Value Date/Time   CHOL 130 01/21/2018 0447   CHOL 125 03/21/2017 0814   TRIG 209 (H) 01/21/2018 0447   HDL 30 (L) 01/21/2018 0447   HDL 37 (L) 03/21/2017 0814   CHOLHDL 4.3 01/21/2018 0447   VLDL 42 (H) 01/21/2018 0447   LDLCALC 58 01/21/2018 0447   LDLCALC 68 03/21/2017 0814    Physical Exam:  VS:  BP 106/82   Pulse 62   Ht 5' 6.5" (1.689 m)   Wt 157 lb (71.2 kg)   BMI 24.96 kg/m     Wt Readings from Last 3 Encounters:  02/05/18 157 lb (71.2 kg)  01/22/18 153 lb (69.4 kg)  12/11/17 159 lb 6.4 oz (72.3 kg)     GEN:  Well nourished, well developed in no acute distress HEENT: Normal NECK: No JVD; No carotid bruits LYMPHATICS: No lymphadenopathy CARDIAC: RRR, no murmurs, rubs, gallops RESPIRATORY:  Clear to auscultation without rales, wheezing or rhonchi  ABDOMEN: Soft, non-tender, non-distended MUSCULOSKELETAL:  No edema; No deformity  SKIN: Warm and dry NEUROLOGIC:  Alert and oriented x 3 PSYCHIATRIC:  Normal affect   ASSESSMENT:    1. OSA (obstructive sleep apnea)   2. Benign hypertensive heart disease without heart failure    PLAN:    In order of problems listed above:  1.  OSA - the patient is tolerating PAP therapy well without any problems. The PAP download was reviewed today and showed an AHI of 5.5/hr on 5 cm H2O with 43% compliance in using more than 4 hours nightly.  He was hospitalized for 4 days recently and then he was not using his device before that because the cord was broken he had to get a new one.  The patient has been using and benefiting from PAP use and will continue to benefit from therapy.  I am going to increase his Pap pressure to 6 cm's H2O due to a mildly elevated AHI and repeat a download in 2 weeks.  I encouraged him to get some Biotene mouthwash to help with the dry mouth.  If this does not help then he may need a  chinstrap.  Asked him to call and let me know if he continues to have a dry mouth.  2. HTN - BP is controlled on exam today. -He will continue on amlodipine 5 mg daily, lisinopril 10 mg daily, Lopressor 25 mg in the evening.     Medication Adjustments/Labs and Tests Ordered: Current medicines are reviewed at length with the patient today.  Concerns regarding medicines are outlined above.  No orders of the defined types were placed in this encounter.  No orders of the defined types were placed in this encounter.   Signed, Fransico Him, MD  02/05/2018 1:30 PM    [place]

## 2018-02-05 NOTE — Patient Instructions (Signed)
Medication Instructions:   Biotene Mouthwash OTC  If you need a refill on your cardiac medications before your next appointment, please call your pharmacy.   Lab work:  If you have labs (blood work) drawn today and your tests are completely normal, you will receive your results only by: Marland Kitchen MyChart Message (if you have MyChart) OR . A paper copy in the mail If you have any lab test that is abnormal or we need to change your treatment, we will call you to review the results.  Follow-Up: At [facility], you and your health needs are our priority.  As part of our continuing mission to provide you with exceptional heart care, we have created designated Provider Care Teams.  These Care Teams include your primary Cardiologist (physician) and Advanced Practice Providers (APPs -  Physician Assistants and Nurse Practitioners) who all work together to provide you with the care you need, when you need it. . You will need a follow up appointment in 1 years.  Please call our office 2 months in advance to schedule this appointment.  You may see Dr. Radford Pax

## 2018-02-10 ENCOUNTER — Ambulatory Visit (HOSPITAL_COMMUNITY): Payer: Medicare Other | Attending: Cardiovascular Disease

## 2018-02-10 ENCOUNTER — Telehealth: Payer: Self-pay | Admitting: Cardiology

## 2018-02-10 ENCOUNTER — Other Ambulatory Visit: Payer: Self-pay

## 2018-02-10 DIAGNOSIS — I5021 Acute systolic (congestive) heart failure: Secondary | ICD-10-CM | POA: Insufficient documentation

## 2018-02-10 MED ORDER — PERFLUTREN LIPID MICROSPHERE
1.0000 mL | INTRAVENOUS | Status: AC | PRN
  Administered 2018-02-10: 2 mL via INTRAVENOUS

## 2018-02-10 NOTE — Telephone Encounter (Signed)
Spoke w/ pt wife and requested that pt send a remote transmission with his home monitor.

## 2018-02-10 NOTE — Telephone Encounter (Signed)
Thank you, Museum/gallery conservator and Pamala Hurry! MCr

## 2018-02-10 NOTE — Telephone Encounter (Signed)
[name] presenting on remote  Transmission received today    He is programmed DDI 22 Looks like he was probably reprogrammed during his 10/14 admission. ICD function normal.

## 2018-02-10 NOTE — Telephone Encounter (Signed)
[name] was recently admitted with recurrent atrial arrhythmias and HF exacerbation. Per notes, Dr Caryl Comes saw him while in the hospital, but I can not find his consult note.  Please call and check on him in regards to AF/HF symptoms.  If any symptoms, offer appt with AF clinic this week.  If doing ok, can keep Dr Caryl Comes appt next week.  Thank you!  Chanetta Marshall, NP 02/10/2018 6:16 PM

## 2018-02-10 NOTE — Telephone Encounter (Signed)
-----   Message from Patsey Berthold, NP sent at 02/10/2018  2:49 PM EST ----- Please have him send a remote to check device function.  Thank you!  ----- Message ----- From: Sanda Klein, MD Sent: 02/10/2018   2:18 PM EST To: Deboraha Sprang, MD, Patsey Berthold, NP  Reading echos today. By Doppler he is in 2:1 AV block with ventricular rate of 53. As far as I can tell, last check on his dual chamber device (LRL 60) was in March. Probably needs a device check, please MCr

## 2018-02-11 NOTE — Telephone Encounter (Signed)
Spoke to patient this morning who said that he was feeling wonderful and that he was about to "go outside and blow leaves."  He will call us if his symptoms return, otherwise he will f/u with Dr Caryl Comes as scheduled.

## 2018-02-12 LAB — CUP PACEART REMOTE DEVICE CHECK
Battery Remaining Longevity: 95 mo
Brady Statistic AP VP Percent: 7.75 %
Brady Statistic AP VS Percent: 79.21 %
Brady Statistic AS VP Percent: 0.16 %
Brady Statistic RA Percent Paced: 83.94 %
Date Time Interrogation Session: 20190926172625
HIGH POWER IMPEDANCE MEASURED VALUE: 34 Ohm
HighPow Impedance: 40 Ohm
Implantable Lead Implant Date: 20110601
Implantable Lead Location: 753859
Implantable Lead Location: 753860
Implantable Lead Model: 5076
Implantable Lead Model: 6947
Lead Channel Impedance Value: 247 Ohm
Lead Channel Impedance Value: 418 Ohm
Lead Channel Pacing Threshold Amplitude: 0.625 V
Lead Channel Pacing Threshold Amplitude: 1.375 V
Lead Channel Pacing Threshold Pulse Width: 0.4 ms
Lead Channel Sensing Intrinsic Amplitude: 0.875 mV
Lead Channel Sensing Intrinsic Amplitude: 0.875 mV
Lead Channel Sensing Intrinsic Amplitude: 1.625 mV
Lead Channel Setting Pacing Amplitude: 2.75 V
Lead Channel Setting Pacing Pulse Width: 0.4 ms
MDC IDC LEAD IMPLANT DT: 20110601
MDC IDC MSMT BATTERY VOLTAGE: 2.99 V
MDC IDC MSMT LEADCHNL RA PACING THRESHOLD PULSEWIDTH: 0.4 ms
MDC IDC MSMT LEADCHNL RV IMPEDANCE VALUE: 285 Ohm
MDC IDC MSMT LEADCHNL RV SENSING INTR AMPL: 1.625 mV
MDC IDC PG IMPLANT DT: 20171213
MDC IDC SET LEADCHNL RA PACING AMPLITUDE: 1.5 V
MDC IDC SET LEADCHNL RV SENSING SENSITIVITY: 0.3 mV
MDC IDC STAT BRADY AS VS PERCENT: 12.88 %
MDC IDC STAT BRADY RV PERCENT PACED: 8.26 %

## 2018-02-13 ENCOUNTER — Other Ambulatory Visit: Payer: Self-pay

## 2018-02-13 ENCOUNTER — Other Ambulatory Visit: Payer: Self-pay | Admitting: Cardiovascular Disease

## 2018-02-13 MED ORDER — RIVAROXABAN 15 MG PO TABS: 15.0000 mg | ORAL_TABLET | Freq: Every day | ORAL | 5 refills | Status: DC

## 2018-02-13 NOTE — Telephone Encounter (Signed)
Received refill request from [facility] to refill Xarelto 20mg  QD.  On refill states pt says he should be on the 10mg  tablet.  I am unable to find any documentation in the chart that the pt's dosage of Xarelto has been changed, and 10mg  would not be an appropriate treatment dosage for pt.   Pt last saw Dr Radford Pax 02/05/18, last labs 01/28/18 Creat 1.83.  Creatinine has been consistently elevated for the past month.  Age 84, weight 71.2kg, CrCl 31.88, discussed with Tana Coast, PharmD Xarelto 20mg  QD is not appropriate dosage based on CrCl 31.88, pt's dosage should be reduced to 15mg  QD.   Called spoke with pt, pt's dosage of Lisinopril was changed from 20mg  to 10mg  upon discharge and he was getting this dosage change confused with his Xarelto rx.  Advised pt that he did in fact given his recent labwork results need to decrease his Xarelto rx to 15mg  QD.  Will send in new rx to pharmacy, pt verbalized understanding.

## 2018-02-15 DIAGNOSIS — Z23 Encounter for immunization: Secondary | ICD-10-CM | POA: Diagnosis not present

## 2018-02-18 ENCOUNTER — Ambulatory Visit (INDEPENDENT_AMBULATORY_CARE_PROVIDER_SITE_OTHER): Payer: Medicare Other | Admitting: Internal Medicine

## 2018-02-18 ENCOUNTER — Encounter: Payer: Self-pay | Admitting: Internal Medicine

## 2018-02-18 ENCOUNTER — Ambulatory Visit: Payer: Medicare Other | Admitting: Nurse Practitioner

## 2018-02-18 VITALS — BP 120/60 | HR 61 | Ht 66.5 in | Wt 158.2 lb

## 2018-02-18 DIAGNOSIS — I471 Supraventricular tachycardia: Secondary | ICD-10-CM

## 2018-02-18 DIAGNOSIS — Z952 Presence of prosthetic heart valve: Secondary | ICD-10-CM

## 2018-02-18 DIAGNOSIS — I469 Cardiac arrest, cause unspecified: Secondary | ICD-10-CM

## 2018-02-18 DIAGNOSIS — R748 Abnormal levels of other serum enzymes: Secondary | ICD-10-CM

## 2018-02-18 DIAGNOSIS — Z95810 Presence of automatic (implantable) cardiac defibrillator: Secondary | ICD-10-CM

## 2018-02-18 DIAGNOSIS — I480 Paroxysmal atrial fibrillation: Secondary | ICD-10-CM | POA: Diagnosis not present

## 2018-02-18 DIAGNOSIS — I5022 Chronic systolic (congestive) heart failure: Secondary | ICD-10-CM | POA: Diagnosis not present

## 2018-02-18 DIAGNOSIS — I255 Ischemic cardiomyopathy: Secondary | ICD-10-CM | POA: Diagnosis not present

## 2018-02-18 DIAGNOSIS — E0590 Thyrotoxicosis, unspecified without thyrotoxic crisis or storm: Secondary | ICD-10-CM

## 2018-02-18 DIAGNOSIS — I4819 Other persistent atrial fibrillation: Secondary | ICD-10-CM | POA: Diagnosis not present

## 2018-02-18 DIAGNOSIS — I359 Nonrheumatic aortic valve disorder, unspecified: Secondary | ICD-10-CM | POA: Diagnosis not present

## 2018-02-18 LAB — CUP PACEART INCLINIC DEVICE CHECK
Battery Voltage: 2.99 V
Brady Statistic AP VS Percent: 1.17 %
Brady Statistic AS VP Percent: 33.97 %
Brady Statistic AS VS Percent: 25.66 %
Brady Statistic RA Percent Paced: 20.02 %
HighPow Impedance: 40 Ohm
HighPow Impedance: 45 Ohm
Implantable Lead Implant Date: 20110601
Implantable Lead Location: 753859
Implantable Lead Location: 753860
Implantable Lead Model: 5076
Implantable Pulse Generator Implant Date: 20171213
Lead Channel Impedance Value: 304 Ohm
Lead Channel Impedance Value: 418 Ohm
Lead Channel Pacing Threshold Amplitude: 0.625 V
Lead Channel Pacing Threshold Amplitude: 1.375 V
Lead Channel Pacing Threshold Pulse Width: 0.4 ms
Lead Channel Sensing Intrinsic Amplitude: 1.75 mV
Lead Channel Sensing Intrinsic Amplitude: 2.125 mV
Lead Channel Setting Pacing Amplitude: 2.75 V
MDC IDC LEAD IMPLANT DT: 20110601
MDC IDC MSMT BATTERY REMAINING LONGEVITY: 91 mo
MDC IDC MSMT LEADCHNL RA PACING THRESHOLD PULSEWIDTH: 0.4 ms
MDC IDC MSMT LEADCHNL RV IMPEDANCE VALUE: 247 Ohm
MDC IDC MSMT LEADCHNL RV SENSING INTR AMPL: 1.25 mV
MDC IDC MSMT LEADCHNL RV SENSING INTR AMPL: 1.5 mV
MDC IDC SESS DTM: 20191112160249
MDC IDC SET LEADCHNL RA PACING AMPLITUDE: 2 V
MDC IDC SET LEADCHNL RV PACING PULSEWIDTH: 0.4 ms
MDC IDC SET LEADCHNL RV SENSING SENSITIVITY: 0.3 mV
MDC IDC STAT BRADY AP VP PERCENT: 39.2 %
MDC IDC STAT BRADY RV PERCENT PACED: 41.51 %

## 2018-02-18 NOTE — H&P (View-Only) (Signed)
[name] Patient Care Team: Binnie Rail, MD as PCP - General (Internal Medicine) Burnell Blanks, MD as PCP - Cardiology (Cardiology) Deboraha Sprang, MD as PCP - Electrophysiology (Cardiology) Sueanne Margarita, MD as PCP - Sleep Medicine (Sleep Medicine) Burnell Blanks, MD as Consulting Physician (Cardiology) Deboraha Sprang, MD as Consulting Physician (Cardiology) Irine Seal, MD as Attending Physician (Urology) Marcene Brawn (Optometry)   HPI  [name] is a 84 y.o. male Seen in followup for ICD implanted for aborted cardiac arrest in the setting of ischemic heart disease prior bypass grafting and redo bypass grafting most recently in 1994 with subsequent PCI.  He underwent ICD generator replacement 12/17.  February 2015 he had recurrent polymorphic ventricular tachycardia associated with a long short initiation sequence  that resulted in appropriate ICD discharge  History of Atrial fibrillation anticoagulated with Rivaroxaban   Aortic stenosis status post TAVR 2017  Hospitalized 10/19 with a heart rate of 124 found to be in atrial tachycardia.  Had an associated non-STEMI.  Underwent catheterization demonstrating persistent obstructive but not severely so coronary disease.  He was started on amiodarone.  He comes in now with persistence of atrial tachycardia   DATE TEST EF   10/14 Echo   60 % AoSt   severe  2015 LHC  LIMA  SVG-OM,SVG RCA patent,  10/15 Echo   35-40 %  LAE severe  10/17 Echo  35-40      .    The question was raised as to whether his LV dysfunction was associated with persistent atrial tachycardia.  He is feeling better since hospital discharge.  Less shortness of breath no edema and no chest pain.    Date Cr K Hgb LFTs TSH  12/18 1.21(GFR 56) 4.0 13     10/19 1.83 4.9 9.7 29 2.398     Thrombo- embolic risk factors  notable for age-57  vascular disease and hypertension and LV dysfunction.  His CHADS VASC score is 5. Past Medical  History:  Diagnosis Date  . Aortic stenosis, severe    s/p TAVR with Edwards Sapien 3 THV (size 26 mm, model # 9600TFX, serial # [id number])  . Arthritis   . Cardiac arrest ([place])   . Chronic systolic CHF (congestive heart failure) ([place]) 01/21/2018  . Claudication ([place])   . Coronary artery disease   . Essential hypertension   . Headache   . History of hiatal hernia   . Hyperlipidemia   . Kidney stones   . Non-ST elevation (NSTEMI) myocardial infarction ([place]) 01/21/2018  . OSA (obstructive sleep apnea) 02/05/2018   Severe obstructive sleep apnea with an AHI of 36.8/h with no significant central sleep apnea.   Now on CPAP at  5 cm H2O  . Peripheral vascular disease ([place])   . S/P cardiac cath 01/22/18  01/23/2018    Past Surgical History:  Procedure Laterality Date  . ADENOIDECTOMY    . APPENDECTOMY    . CARDIAC CATHETERIZATION   11/16/2009   . CARDIAC DEFIBRILLATOR PLACEMENT    . cataract surgery      Bilateral cataract surgery  . CORONARY ARTERY BYPASS GRAFT     in 1982 and '84 with subsequent PTCI  . EP IMPLANTABLE DEVICE N/A 03/21/2016   Procedure: ICD Generator Changeout;  Surgeon: Deboraha Sprang, MD;  Location: [place] [facility];  Service: Cardiovascular;  Laterality: N/A;  . HERNIA REPAIR    . LEFT AND RIGHT HEART CATHETERIZATION WITH CORONARY/GRAFT ANGIOGRAM  N/A 02/01/2014   Procedure: LEFT AND RIGHT HEART CATHETERIZATION WITH Beatrix Fetters;  Surgeon: Sinclair Grooms, MD;  Location: [facility] CATH LAB;  Service: Cardiovascular;  Laterality: N/A;  . left elbow tendon surgery    . LEFT HEART CATH AND CORS/GRAFTS ANGIOGRAPHY N/A 01/22/2018   Procedure: LEFT HEART CATH AND CORS/GRAFTS ANGIOGRAPHY;  Surgeon: Leonie Man, MD;  Location: [place] [facility];  Service: Cardiovascular;  Laterality: N/A;  . TEE WITHOUT CARDIOVERSION N/A 03/23/2014   Procedure: TRANSESOPHAGEAL ECHOCARDIOGRAM (TEE);  Surgeon: Burnell Blanks, MD;  Location: [place];  Service: Open Heart  Surgery;  Laterality: N/A;  . TONSILLECTOMY    . TRANSCATHETER AORTIC VALVE REPLACEMENT, TRANSFEMORAL N/A 03/23/2014   Procedure: TRANSCATHETER AORTIC VALVE REPLACEMENT, TRANSFEMORAL;  Surgeon: Burnell Blanks, MD;  Location: [place];  Service: Open Heart Surgery;  Laterality: N/A;    Current Outpatient Medications  Medication Sig Dispense Refill  . amiodarone (PACERONE) 200 MG tablet Take 1 tablet (200 mg total) by mouth daily. 30 tablet 6  . amLODipine (NORVASC) 5 MG tablet TAKE 1 TABLET BY MOUTH DAILY. 90 tablet 3  . amoxicillin (AMOXIL) 500 MG tablet Take 4 tablets by mouth 60 minutes prior to dental appointments 4 tablet 4  . Calcium Carbonate Antacid (TUMS PO) Take 2 tablets by mouth 2 (two) times daily as needed (indigestion/ acid reflux).    . Coenzyme Q10-Vitamin E (QUNOL ULTRA COQ10 PO) Take 10-15 mLs by mouth at bedtime.    . diazepam (VALIUM) 5 MG tablet Take 1 tablet (5 mg total) by mouth daily as needed. 30 tablet 2  . furosemide (LASIX) 40 MG tablet Take 1 tablet (40 mg total) by mouth daily. 90 tablet 3  . ketotifen (ZADITOR) 0.025 % ophthalmic solution Place 1 drop into both eyes daily as needed (seasonal allergies).    Marland Kitchen KRILL OIL PO Take 1 capsule by mouth at bedtime.     Marland Kitchen latanoprost (XALATAN) 0.005 % ophthalmic solution Place 1 drop into both eyes at bedtime.     Marland Kitchen lisinopril (PRINIVIL,ZESTRIL) 10 MG tablet Take 1 tablet (10 mg total) by mouth daily. 30 tablet 1  . Magnesium Oxide (MAG-OXIDE PO) Take 1 tablet by mouth at bedtime.     . Melatonin 1 MG TABS Take 2-3 mg by mouth at bedtime as needed (sleep).    . metoprolol tartrate (LOPRESSOR) 50 MG tablet TAKE 1/2 TABLET BY MOUTH EVERY EVENING. 45 tablet 3  . Multiple Vitamin (MULTIVITAMIN WITH MINERALS) TABS tablet Take 1 tablet by mouth daily.    . nitroGLYCERIN (NITROSTAT) 0.4 MG SL tablet Place 1 tablet (0.4 mg total) under the tongue every 5 (five) minutes as needed for chest pain (for chest pain (MAX of 3  doses)). 25 tablet 6  . Potassium 99 MG TABS Take 99 mg by mouth 2 (two) times daily.    Marland Kitchen Propylene Glycol (SYSTANE COMPLETE) 0.6 % SOLN Place 1 drop into both eyes daily as needed (dry eyes).    . Rivaroxaban (XARELTO) 15 MG TABS tablet Take 1 tablet (15 mg total) by mouth daily with supper. 30 tablet 5  . rosuvastatin (CRESTOR) 40 MG tablet TAKE 1 TABLET BY MOUTH DAILY. 90 tablet 3  . Thiamine HCl (VITAMIN B-1) 100 MG tablet Take 100 mg by mouth at bedtime.     . trospium (SANCTURA) 20 MG tablet Take 20 mg by mouth 2 (two) times daily.     No current facility-administered medications for this visit.  Allergies  Allergen Reactions  . Cyclobenzaprine Hcl Other (See Comments)    Unknown reaction  . Tetracycline Rash         Review of Systems negative except from HPI and PMH  Physical Exam BP 120/60   Pulse 61   Ht 5' 6.5" (1.689 m)   Wt 158 lb 3.2 oz (71.8 kg)   SpO2 99%   BMI 25.15 kg/m  Well developed and nourished in no acute distress HENT normal Neck supple with JVP 6-8 Clear Regular rate and rhythm, no murmurs or gallops Abd-soft with active BS No Clubbing cyanosis tr edema Skin-warm and dry A & Oriented  Grossly normal sensory and motor function    ECG Atrial tach  awith 2:1 conduction   Assessment and  Plan  Sudden cardiac death-aborted -    AUTOMATIC IMPLANTABLE CARDIAC DEFIBRILLATOR SITU   .   S/P CABG (coronary artery bypass graft) -     Ischemic Cardiomyopathy  Aortic stenosis-s/p TAVR   Aortic insufficiency  new  Renal Insufficiency grade 3  Benign hypertensive heart disease   Anemia   Atrial fibrillation//atrial tach      Persistent atrial tachycardia.  Multiple attempts were made to pace terminated.  These failed.  We did accelerated atrial fibrillation.  Hopefully he will terminate spontaneously.  In the event not, we will undertake cardioversion now on supported amiodarone.  We will need amiodarone surveillance labs.  Without  symptoms of ischemia  On Anticoagulation;  No bleeding issues     Anemia is acute   Will check Hgb   More than 50% of 40 min was spent in counseling related to the above

## 2018-02-18 NOTE — Progress Notes (Addendum)
[name] Patient Care Team: Binnie Rail, MD as PCP - General (Internal Medicine) Burnell Blanks, MD as PCP - Cardiology (Cardiology) Deboraha Sprang, MD as PCP - Electrophysiology (Cardiology) Sueanne Margarita, MD as PCP - Sleep Medicine (Sleep Medicine) Burnell Blanks, MD as Consulting Physician (Cardiology) Deboraha Sprang, MD as Consulting Physician (Cardiology) Irine Seal, MD as Attending Physician (Urology) Marcene Brawn (Optometry)   HPI  [name] is a 84 y.o. male Seen in followup for ICD implanted for aborted cardiac arrest in the setting of ischemic heart disease prior bypass grafting and redo bypass grafting most recently in 1994 with subsequent PCI.  He underwent ICD generator replacement 12/17.  February 2015 he had recurrent polymorphic ventricular tachycardia associated with a long short initiation sequence  that resulted in appropriate ICD discharge  History of Atrial fibrillation anticoagulated with Rivaroxaban   Aortic stenosis status post TAVR 2017  Hospitalized 10/19 with a heart rate of 124 found to be in atrial tachycardia.  Had an associated non-STEMI.  Underwent catheterization demonstrating persistent obstructive but not severely so coronary disease.  He was started on amiodarone.  He comes in now with persistence of atrial tachycardia   DATE TEST EF   10/14 Echo   60 % AoSt   severe  2015 LHC  LIMA  SVG-OM,SVG RCA patent,  10/15 Echo   35-40 %  LAE severe  10/17 Echo  35-40      .    The question was raised as to whether his LV dysfunction was associated with persistent atrial tachycardia.  He is feeling better since hospital discharge.  Less shortness of breath no edema and no chest pain.    Date Cr K Hgb LFTs TSH  12/18 1.21(GFR 56) 4.0 13     10/19 1.83 4.9 9.7 29 2.398     Thrombo- embolic risk factors  notable for age-36  vascular disease and hypertension and LV dysfunction.  His CHADS VASC score is 5. Past Medical  History:  Diagnosis Date  . Aortic stenosis, severe    s/p TAVR with Edwards Sapien 3 THV (size 26 mm, model # 9600TFX, serial # [id number])  . Arthritis   . Cardiac arrest ([place])   . Chronic systolic CHF (congestive heart failure) ([place]) 01/21/2018  . Claudication ([place])   . Coronary artery disease   . Essential hypertension   . Headache   . History of hiatal hernia   . Hyperlipidemia   . Kidney stones   . Non-ST elevation (NSTEMI) myocardial infarction ([place]) 01/21/2018  . OSA (obstructive sleep apnea) 02/05/2018   Severe obstructive sleep apnea with an AHI of 36.8/h with no significant central sleep apnea.   Now on CPAP at  5 cm H2O  . Peripheral vascular disease ([place])   . S/P cardiac cath 01/22/18  01/23/2018    Past Surgical History:  Procedure Laterality Date  . ADENOIDECTOMY    . APPENDECTOMY    . CARDIAC CATHETERIZATION   11/16/2009   . CARDIAC DEFIBRILLATOR PLACEMENT    . cataract surgery      Bilateral cataract surgery  . CORONARY ARTERY BYPASS GRAFT     in 1982 and '84 with subsequent PTCI  . EP IMPLANTABLE DEVICE N/A 03/21/2016   Procedure: ICD Generator Changeout;  Surgeon: Deboraha Sprang, MD;  Location: [facility];  Service: Cardiovascular;  Laterality: N/A;  . HERNIA REPAIR    . LEFT AND RIGHT HEART CATHETERIZATION WITH CORONARY/GRAFT ANGIOGRAM  N/A 02/01/2014   Procedure: LEFT AND RIGHT HEART CATHETERIZATION WITH Beatrix Fetters;  Surgeon: Sinclair Grooms, MD;  Location[facility] CATH [facility];  Service: Cardiovascular;  Laterality: N/A;  . left elbow tendon surgery    . LEFT HEART CATH AND CORS/GRAFTS ANGIOGRAPHY N/A 01/22/2018   Procedure: LEFT HEART CATH AND CORS/GRAFTS ANGIOGRAPHY;  Surgeon: Leonie Man, MD;  Location: [facility];  Service: Cardiovascular;  Laterality: N/A;  . TEE WITHOUT CARDIOVERSION N/A 03/23/2014   Procedure: TRANSESOPHAGEAL ECHOCARDIOGRAM (TEE);  Surgeon: Burnell Blanks, MD;  Location: [facility];  Service: Open Heart  Surgery;  Laterality: N/A;  . TONSILLECTOMY    . TRANSCATHETER AORTIC VALVE REPLACEMENT, TRANSFEMORAL N/A 03/23/2014   Procedure: TRANSCATHETER AORTIC VALVE REPLACEMENT, TRANSFEMORAL;  Surgeon: Burnell Blanks, MD;  Location: [place];  Service: Open Heart Surgery;  Laterality: N/A;    Current Outpatient Medications  Medication Sig Dispense Refill  . amiodarone (PACERONE) 200 MG tablet Take 1 tablet (200 mg total) by mouth daily. 30 tablet 6  . amLODipine (NORVASC) 5 MG tablet TAKE 1 TABLET BY MOUTH DAILY. 90 tablet 3  . amoxicillin (AMOXIL) 500 MG tablet Take 4 tablets by mouth 60 minutes prior to dental appointments 4 tablet 4  . Calcium Carbonate Antacid (TUMS PO) Take 2 tablets by mouth 2 (two) times daily as needed (indigestion/ acid reflux).    . Coenzyme Q10-Vitamin E (QUNOL ULTRA COQ10 PO) Take 10-15 mLs by mouth at bedtime.    . diazepam (VALIUM) 5 MG tablet Take 1 tablet (5 mg total) by mouth daily as needed. 30 tablet 2  . furosemide (LASIX) 40 MG tablet Take 1 tablet (40 mg total) by mouth daily. 90 tablet 3  . ketotifen (ZADITOR) 0.025 % ophthalmic solution Place 1 drop into both eyes daily as needed (seasonal allergies).    Marland Kitchen KRILL OIL PO Take 1 capsule by mouth at bedtime.     Marland Kitchen latanoprost (XALATAN) 0.005 % ophthalmic solution Place 1 drop into both eyes at bedtime.     Marland Kitchen lisinopril (PRINIVIL,ZESTRIL) 10 MG tablet Take 1 tablet (10 mg total) by mouth daily. 30 tablet 1  . Magnesium Oxide (MAG-OXIDE PO) Take 1 tablet by mouth at bedtime.     . Melatonin 1 MG TABS Take 2-3 mg by mouth at bedtime as needed (sleep).    . metoprolol tartrate (LOPRESSOR) 50 MG tablet TAKE 1/2 TABLET BY MOUTH EVERY EVENING. 45 tablet 3  . Multiple Vitamin (MULTIVITAMIN WITH MINERALS) TABS tablet Take 1 tablet by mouth daily.    . nitroGLYCERIN (NITROSTAT) 0.4 MG SL tablet Place 1 tablet (0.4 mg total) under the tongue every 5 (five) minutes as needed for chest pain (for chest pain (MAX of 3  doses)). 25 tablet 6  . Potassium 99 MG TABS Take 99 mg by mouth 2 (two) times daily.    Marland Kitchen Propylene Glycol (SYSTANE COMPLETE) 0.6 % SOLN Place 1 drop into both eyes daily as needed (dry eyes).    . Rivaroxaban (XARELTO) 15 MG TABS tablet Take 1 tablet (15 mg total) by mouth daily with supper. 30 tablet 5  . rosuvastatin (CRESTOR) 40 MG tablet TAKE 1 TABLET BY MOUTH DAILY. 90 tablet 3  . Thiamine HCl (VITAMIN B-1) 100 MG tablet Take 100 mg by mouth at bedtime.     . trospium (SANCTURA) 20 MG tablet Take 20 mg by mouth 2 (two) times daily.     No current facility-administered medications for this visit.  Allergies  Allergen Reactions  . Cyclobenzaprine Hcl Other (See Comments)    Unknown reaction  . Tetracycline Rash         Review of Systems negative except from HPI and PMH  Physical Exam BP 120/60   Pulse 61   Ht 5' 6.5" (1.689 m)   Wt 158 lb 3.2 oz (71.8 kg)   SpO2 99%   BMI 25.15 kg/m  Well developed and nourished in no acute distress HENT normal Neck supple with JVP 6-8 Clear Regular rate and rhythm, no murmurs or gallops Abd-soft with active BS No Clubbing cyanosis tr edema Skin-warm and dry A & Oriented  Grossly normal sensory and motor function    ECG Atrial tach  awith 2:1 conduction   Assessment and  Plan  Sudden cardiac death-aborted -    AUTOMATIC IMPLANTABLE CARDIAC DEFIBRILLATOR SITU   .   S/P CABG (coronary artery bypass graft) -     Ischemic Cardiomyopathy  Aortic stenosis-s/p TAVR   Aortic insufficiency  new  Renal Insufficiency grade 3  Benign hypertensive heart disease   Anemia   Atrial fibrillation//atrial tach      Persistent atrial tachycardia.  Multiple attempts were made to pace terminated.  These failed.  We did accelerated atrial fibrillation.  Hopefully he will terminate spontaneously.  In the event not, we will undertake cardioversion now on supported amiodarone.  We will need amiodarone surveillance labs.  Without  symptoms of ischemia  On Anticoagulation;  No bleeding issues     Anemia is acute   Will check Hgb   More than 50% of 40 min was spent in counseling related to the above

## 2018-02-18 NOTE — Patient Instructions (Addendum)
Medication Instructions:  Your physician recommends that you continue on your current medications as directed. Please refer to the Current Medication list given to you today.  Labwork: You will have labs drawn today: CBC, BMP, TSH, LFT   Testing/Procedures: Your physician has recommended that you have a Cardioversion (DCCV). Electrical Cardioversion uses a jolt of electricity to your heart either through paddles or wired patches attached to your chest. This is a controlled, usually prescheduled, procedure. Defibrillation is done under light anesthesia in the hospital, and you usually go home the day of the procedure. This is done to get your heart back into a normal rhythm. You are not awake for the procedure. Please see the instruction sheet given to you today.   Follow-Up: Your physician recommends that you schedule a follow-up appointment in:   2 months with Tommye Standard  12 months with Dr Caryl Comes  Remote monitoring is used to monitor your ICD from home. This monitoring reduces the number of office visits required to check your device to one time per year. It allows [place] to keep an eye on the functioning of your device to ensure it is working properly. You are scheduled for a device check from home on 04/03/2018. You may send your transmission at any time that day. If you have a wireless device, the transmission will be sent automatically. After your physician reviews your transmission, you will receive a postcard with your next transmission date.    Any Other Special Instructions Will Be Listed Below (If Applicable).     If you need a refill on your cardiac medications before your next appointment, please call your pharmacy.

## 2018-02-19 LAB — HEPATIC FUNCTION PANEL
ALT: 13 IU/L (ref 0–44)
AST: 17 IU/L (ref 0–40)
Albumin: 4.8 g/dL — ABNORMAL HIGH (ref 3.5–4.7)
Alkaline Phosphatase: 54 IU/L (ref 39–117)
Bilirubin Total: 0.3 mg/dL (ref 0.0–1.2)
Bilirubin, Direct: 0.11 mg/dL (ref 0.00–0.40)
Total Protein: 7.3 g/dL (ref 6.0–8.5)

## 2018-02-19 LAB — BASIC METABOLIC PANEL
BUN / CREAT RATIO: 16 (ref 10–24)
BUN: 27 mg/dL (ref 8–27)
CO2: 21 mmol/L (ref 20–29)
CREATININE: 1.68 mg/dL — AB (ref 0.76–1.27)
Calcium: 10.4 mg/dL — ABNORMAL HIGH (ref 8.6–10.2)
Chloride: 103 mmol/L (ref 96–106)
GFR calc Af Amer: 43 mL/min/{1.73_m2} — ABNORMAL LOW (ref >59)
GFR, EST NON AFRICAN AMERICAN: 37 mL/min/{1.73_m2} — AB (ref >59)
GLUCOSE: 120 mg/dL — AB (ref 65–99)
Potassium: 4 mmol/L (ref 3.5–5.2)
SODIUM: 141 mmol/L (ref 134–144)

## 2018-02-19 LAB — CBC
Hematocrit: 30.6 % — ABNORMAL LOW (ref 37.5–51.0)
Hemoglobin: 9.8 g/dL — ABNORMAL LOW (ref 13.0–17.7)
MCH: 26.5 pg — AB (ref 26.6–33.0)
MCHC: 32 g/dL (ref 31.5–35.7)
MCV: 83 fL (ref 79–97)
Platelets: 175 10*3/uL (ref 150–450)
RBC: 3.7 x10E6/uL — ABNORMAL LOW (ref 4.14–5.80)
RDW: 13.5 % (ref 12.3–15.4)
WBC: 5.8 10*3/uL (ref 3.4–10.8)

## 2018-02-19 LAB — TSH: TSH: 2.53 u[IU]/mL (ref 0.450–4.500)

## 2018-02-20 ENCOUNTER — Other Ambulatory Visit: Payer: Medicare Other

## 2018-02-20 ENCOUNTER — Telehealth: Payer: Self-pay

## 2018-02-20 DIAGNOSIS — D649 Anemia, unspecified: Secondary | ICD-10-CM | POA: Diagnosis not present

## 2018-02-20 NOTE — Telephone Encounter (Signed)
-----   Message from Deboraha Sprang, MD sent at 02/19/2018 10:47 AM EST ----- L  Pt has persistent and new anemia   Could you please oreder Ferritin and Fe/TIBC  thx

## 2018-02-20 NOTE — Telephone Encounter (Signed)
Pt agrees to have anemia labs drawn. Will come in today.

## 2018-02-21 LAB — IRON AND TIBC
IRON SATURATION: 4 % — AB (ref 15–55)
Iron: 19 ug/dL — ABNORMAL LOW (ref 38–169)
Total Iron Binding Capacity: 429 ug/dL (ref 250–450)
UIBC: 410 ug/dL — ABNORMAL HIGH (ref 111–343)

## 2018-02-21 LAB — FERRITIN: FERRITIN: 11 ng/mL — AB (ref 30–400)

## 2018-02-22 ENCOUNTER — Other Ambulatory Visit: Payer: Self-pay | Admitting: Cardiology

## 2018-02-22 DIAGNOSIS — I4819 Other persistent atrial fibrillation: Secondary | ICD-10-CM

## 2018-02-22 MED ORDER — AMIODARONE HCL 200 MG PO TABS: 200.0000 mg | ORAL_TABLET | Freq: Every day | ORAL | 6 refills | Status: AC

## 2018-02-24 ENCOUNTER — Ambulatory Visit (HOSPITAL_COMMUNITY)
Admission: RE | Admit: 2018-02-24 | Discharge: 2018-02-24 | Disposition: A | Discharge status: Home / Self Care | Payer: Medicare Other | Source: Ambulatory Visit | Attending: Cardiology | Admitting: Cardiology

## 2018-02-24 ENCOUNTER — Encounter (HOSPITAL_COMMUNITY): Payer: Self-pay | Admitting: *Deleted

## 2018-02-24 ENCOUNTER — Ambulatory Visit (HOSPITAL_COMMUNITY): Payer: Medicare Other | Admitting: Anesthesiology

## 2018-02-24 ENCOUNTER — Encounter (HOSPITAL_COMMUNITY)
Admission: RE | Disposition: A | Discharge status: Home / Self Care | Payer: Self-pay | Source: Ambulatory Visit | Attending: Cardiology

## 2018-02-24 DIAGNOSIS — I471 Supraventricular tachycardia: Secondary | ICD-10-CM | POA: Diagnosis not present

## 2018-02-24 DIAGNOSIS — I350 Nonrheumatic aortic (valve) stenosis: Secondary | ICD-10-CM | POA: Insufficient documentation

## 2018-02-24 DIAGNOSIS — Z8674 Personal history of sudden cardiac arrest: Secondary | ICD-10-CM | POA: Diagnosis not present

## 2018-02-24 DIAGNOSIS — Z9889 Other specified postprocedural states: Secondary | ICD-10-CM | POA: Diagnosis not present

## 2018-02-24 DIAGNOSIS — Z888 Allergy status to other drugs, medicaments and biological substances status: Secondary | ICD-10-CM | POA: Diagnosis not present

## 2018-02-24 DIAGNOSIS — I110 Hypertensive heart disease with heart failure: Secondary | ICD-10-CM | POA: Insufficient documentation

## 2018-02-24 DIAGNOSIS — I252 Old myocardial infarction: Secondary | ICD-10-CM | POA: Insufficient documentation

## 2018-02-24 DIAGNOSIS — I5022 Chronic systolic (congestive) heart failure: Secondary | ICD-10-CM | POA: Diagnosis not present

## 2018-02-24 DIAGNOSIS — Z95810 Presence of automatic (implantable) cardiac defibrillator: Secondary | ICD-10-CM | POA: Insufficient documentation

## 2018-02-24 DIAGNOSIS — I4891 Unspecified atrial fibrillation: Secondary | ICD-10-CM | POA: Diagnosis not present

## 2018-02-24 DIAGNOSIS — Z79899 Other long term (current) drug therapy: Secondary | ICD-10-CM | POA: Diagnosis not present

## 2018-02-24 DIAGNOSIS — G4733 Obstructive sleep apnea (adult) (pediatric): Secondary | ICD-10-CM | POA: Insufficient documentation

## 2018-02-24 DIAGNOSIS — Z951 Presence of aortocoronary bypass graft: Secondary | ICD-10-CM | POA: Diagnosis not present

## 2018-02-24 DIAGNOSIS — I2510 Atherosclerotic heart disease of native coronary artery without angina pectoris: Secondary | ICD-10-CM | POA: Insufficient documentation

## 2018-02-24 DIAGNOSIS — N289 Disorder of kidney and ureter, unspecified: Secondary | ICD-10-CM | POA: Insufficient documentation

## 2018-02-24 DIAGNOSIS — I469 Cardiac arrest, cause unspecified: Secondary | ICD-10-CM

## 2018-02-24 DIAGNOSIS — Z952 Presence of prosthetic heart valve: Secondary | ICD-10-CM | POA: Diagnosis not present

## 2018-02-24 DIAGNOSIS — Z955 Presence of coronary angioplasty implant and graft: Secondary | ICD-10-CM | POA: Diagnosis not present

## 2018-02-24 DIAGNOSIS — Z881 Allergy status to other antibiotic agents status: Secondary | ICD-10-CM | POA: Diagnosis not present

## 2018-02-24 DIAGNOSIS — E785 Hyperlipidemia, unspecified: Secondary | ICD-10-CM | POA: Diagnosis not present

## 2018-02-24 DIAGNOSIS — I255 Ischemic cardiomyopathy: Secondary | ICD-10-CM | POA: Insufficient documentation

## 2018-02-24 DIAGNOSIS — D649 Anemia, unspecified: Secondary | ICD-10-CM | POA: Diagnosis not present

## 2018-02-24 DIAGNOSIS — Z7901 Long term (current) use of anticoagulants: Secondary | ICD-10-CM | POA: Diagnosis not present

## 2018-02-24 DIAGNOSIS — Z87442 Personal history of urinary calculi: Secondary | ICD-10-CM | POA: Insufficient documentation

## 2018-02-24 HISTORY — PX: CARDIOVERSION: SHX1299

## 2018-02-24 SURGERY — CARDIOVERSION
Anesthesia: General

## 2018-02-24 MED ORDER — SODIUM CHLORIDE 0.9 % IV SOLN: INTRAVENOUS | Status: DC

## 2018-02-24 MED ORDER — PROPOFOL 10 MG/ML IV BOLUS
INTRAVENOUS | Status: DC | PRN
  Administered 2018-02-24: 40 mg via INTRAVENOUS
  Administered 2018-02-24: 10 mg via INTRAVENOUS

## 2018-02-24 MED ORDER — LIDOCAINE 2% (20 MG/ML) 5 ML SYRINGE
INTRAMUSCULAR | Status: DC | PRN
  Administered 2018-02-24: 60 mg via INTRAVENOUS

## 2018-02-24 MED ORDER — METOPROLOL TARTRATE 50 MG PO TABS: 25.0000 mg | ORAL_TABLET | Freq: Every day | ORAL | Status: DC

## 2018-02-24 NOTE — Discharge Instructions (Signed)
Electrical Cardioversion, Care After °This sheet gives you information about how to care for yourself after your procedure. Your health care provider may also give you more specific instructions. If you have problems or questions, contact your health care provider. °What can I expect after the procedure? °After the procedure, it is common to have: °· Some redness on the skin where the shocks were given. ° °Follow these instructions at home: °· Do not drive for 24 hours if you were given a medicine to help you relax (sedative). °· Take over-the-counter and prescription medicines only as told by your health care provider. °· Ask your health care provider how to check your pulse. Check it often. °· Rest for 48 hours after the procedure or as told by your health care provider. °· Avoid or limit your caffeine use as told by your health care provider. °Contact a health care provider if: °· You feel like your heart is beating too quickly or your pulse is not regular. °· You have a serious muscle cramp that does not go away. °Get help right away if: °· You have discomfort in your chest. °· You are dizzy or you feel faint. °· You have trouble breathing or you are short of breath. °· Your speech is slurred. °· You have trouble moving an arm or leg on one side of your body. °· Your fingers or toes turn cold or blue. °This information is not intended to replace advice given to you by your health care provider. Make sure you discuss any questions you have with your health care provider. °Document Released: 01/14/2013 Document Revised: 10/28/2015 Document Reviewed: 09/30/2015 °Elsevier Interactive Patient Education © 2018 [employer] ° °

## 2018-02-24 NOTE — Interval H&P Note (Signed)
History and Physical Interval Note:  02/24/2018 8:48 AM  [name]  has presented today for surgery, with the diagnosis of AFIB  The various methods of treatment have been discussed with the patient and family. After consideration of risks, benefits and other options for treatment, the patient has consented to  Procedure(s): CARDIOVERSION (N/A) as a surgical intervention .  The patient's history has been reviewed, patient examined, no change in status, stable for surgery.  I have reviewed the patient's chart and labs.  Questions were answered to the patient's satisfaction.     Fransico Him

## 2018-02-24 NOTE — CV Procedure (Addendum)
   Electrical Cardioversion Procedure Note [name] [id number] 1934-01-06  Procedure: Electrical Cardioversion Indications:  Atrial Tachycardia  Time Out: Verified patient identification, verified procedure,medications/allergies/relevent history reviewed, required imaging and test results available.  Performed  Procedure Details  The patient was NPO after midnight. Anesthesia was administered at the beside  by [facility] with 50mg  of propofol and Lidocaine 60mg   Cardioversion was done with synchronized biphasic defibrillation with AP pads with 150watts.  The patient converted to normal sinus rhythm. The patient tolerated the procedure well   IMPRESSION:  Successful cardioversion of atrial tachycardia    [name] 02/24/2018, 8:48 AM

## 2018-02-24 NOTE — Anesthesia Procedure Notes (Signed)
Procedure Name: General with mask airway Date/Time: 02/24/2018 9:07 AM Performed by: Lance Coon, CRNA Pre-anesthesia Checklist: Patient identified, Emergency Drugs available, Suction available, Patient being monitored and Timeout performed Patient Re-evaluated:Patient Re-evaluated prior to induction Oxygen Delivery Method: Ambu bag Preoxygenation: Pre-oxygenation with 100% oxygen Induction Type: IV induction Ventilation: Mask ventilation without difficulty

## 2018-02-24 NOTE — Anesthesia Preprocedure Evaluation (Addendum)
Anesthesia Evaluation  Patient identified by MRN, date of birth, ID band Patient awake    Reviewed: Allergy & Precautions, NPO status , Patient's Chart, lab work & pertinent test results, reviewed documented beta blocker date and time   History of Anesthesia Complications Negative for: history of anesthetic complications  Airway Mallampati: I  TM Distance: >3 FB Neck ROM: Full    Dental  (+) Dental Advisory Given, Teeth Intact   Pulmonary sleep apnea and Continuous Positive Airway Pressure Ventilation , former smoker,    breath sounds clear to auscultation       Cardiovascular hypertension, Pt. on medications and Pt. on home beta blockers + CAD, + Past MI, + CABG, + Peripheral Vascular Disease and +CHF  + Valvular Problems/Murmurs (s/p TAVR)  Rhythm:Irregular Rate:Normal   '19 TTE - LV cavity mildly dilated. EF 40% to 45%. Hypokinesis of the inferolateral myocardium.Ventricular septal motion showed paradox. A stent-valve (TAVR) bioprosthesis was present and functioning normally. Mild MR. Left atrium was mildly dilated. RV cavity size was mildly dilated. Right atrium was moderately dilated.  '19 Cath - Mid LM to Prox LAD lesion is 100% stenosed with 100% stenosed side branch in Ost Cx to Dist Cx. Known CTO SVG-OM graft was visualized by angiography and is large. The graft exhibits minimal luminal irregularities. Origin to Prox Graft stent has 5% in-stent restenosis. Mid Graft to Dist Graft stent has 5% in-stent restenosis. LIMA graft was visualized by angiography and is normal in caliber. The graft exhibits no disease. The graft insertion to the mid LAD. Prox LAD to Mid LAD lesion is 90% stenosed -retrograde from LIMA insertion. (Stable). Mid LAD to Dist LAD lesion is 90% stenosed-antegrade from LIMA insertion. (Stable). Ost RCA to Dist RCA lesion 100% stenosed. Known CTO SVG-dRCA graft was visualized by angiography and is  large. The graft exhibits minimal luminal irregularities. Dist RCA lesion is 40% stenosed at graft insertion site.   Neuro/Psych  Headaches, negative psych ROS   GI/Hepatic Neg liver ROS, hiatal hernia,   Endo/Other  negative endocrine ROS  Renal/GU Renal InsufficiencyRenal disease     Musculoskeletal  (+) Arthritis ,   Abdominal   Peds  Hematology  (+) anemia ,   Anesthesia Other Findings   Reproductive/Obstetrics                            Anesthesia Physical Anesthesia Plan  ASA: III  Anesthesia Plan: General   Post-op Pain Management:    Induction: Intravenous  PONV Risk Score and Plan: 2 and Treatment may vary due to age or medical condition and Propofol infusion  Airway Management Planned: Mask and Natural Airway  Additional Equipment: None  Intra-op Plan:   Post-operative Plan:   Informed Consent: I have reviewed the patients History and Physical, chart, labs and discussed the procedure including the risks, benefits and alternatives for the proposed anesthesia with the patient or authorized representative who has indicated his/her understanding and acceptance.     Plan Discussed with: CRNA and Anesthesiologist  Anesthesia Plan Comments:        Anesthesia Quick Evaluation

## 2018-02-24 NOTE — Transfer of Care (Signed)
Immediate Anesthesia Transfer of Care Note  Patient: [name]  Procedure(s) Performed: CARDIOVERSION (N/A )  Patient Location: Endoscopy Unit  Anesthesia Type:General  Level of Consciousness: drowsy and patient cooperative  Airway & Oxygen Therapy: Patient Spontanous Breathing  Post-op Assessment: Report given to RN and Post -op Vital signs reviewed and stable  Post vital signs: Reviewed and stable  Last Vitals:  Vitals Value Taken Time  BP 126/49 02/24/2018  9:12 AM  Temp    Pulse 58 02/24/2018  9:13 AM  Resp 18 02/24/2018  9:13 AM  SpO2 97 % 02/24/2018  9:13 AM    Last Pain:  Vitals:   02/24/18 0831  TempSrc: Oral  PainSc: 0-No pain         Complications: No apparent anesthesia complications

## 2018-02-25 NOTE — Anesthesia Postprocedure Evaluation (Signed)
Anesthesia Post Note  Patient: [name]  Procedure(s) Performed: CARDIOVERSION (N/A )     Patient location during evaluation: PACU Anesthesia Type: General Level of consciousness: awake and alert Pain management: pain level controlled Vital Signs Assessment: post-procedure vital signs reviewed and stable Respiratory status: spontaneous breathing, nonlabored ventilation and respiratory function stable Cardiovascular status: blood pressure returned to baseline and stable Postop Assessment: no apparent nausea or vomiting Anesthetic complications: no    Last Vitals:  Vitals:   02/24/18 0920 02/24/18 0930  BP: (!) 105/54 (!) 119/55  Pulse: 62 62  Resp: (!) 21 18  Temp:    SpO2: 94% 95%    Last Pain:  Vitals:   02/25/18 1537  TempSrc:   PainSc: 0-No pain                 Audry Pili

## 2018-02-26 ENCOUNTER — Encounter (HOSPITAL_COMMUNITY): Payer: Self-pay | Admitting: Cardiology

## 2018-02-27 ENCOUNTER — Telehealth: Payer: Self-pay | Admitting: Internal Medicine

## 2018-02-27 NOTE — Telephone Encounter (Signed)
Copied from [place] [id number]. Topic: General - Other >> Feb 27, 2018 10:54 AM Cecelia Byars, NT wrote: Reason for CRM: Patient called and states Dr Olin Pia nurse called to tell him he is anemic [phone] , please advise

## 2018-02-27 NOTE — Telephone Encounter (Signed)
Spoke with pt and advised that he has not been seen in a while and needs to make an appointment to address this issue.

## 2018-03-02 NOTE — Progress Notes (Signed)
Subjective:    Patient ID: [name], male    DOB: 1933-05-26, 84 y.o.   MRN: [id number]  HPI The patient is here for an acute visit.  I last saw him in 2017.  He was in the hospital in October 2019 after experiencing throat tightness and tachycardia.  He was found to have a non- ST elevation MI.  He had an echocardiogram during the hospitalization and his EF had decreased.  He had a cardiac catheterization and was started on amiodarone.  During the hospitalization his kidney function decreased significantly.  It is unclear if this was related to his MI or contrast with the catheterization.  At this time he also became anemic, which did not worsen during the hospitalization.   Anemia:  He was advised to f/u with me regarding his anemia.  He is taking xarelto daily as prescribed for Afib.  He denies any bright red blood in the stool or black stool.  He denies any blood in the urine.  Been no signs of bleeding.  His only symptom that he is noticed since his hospitalization in October was some increase in fatigue.  When he sits down the couch he can fall asleep, which is different from previously.  He has not noticed any chest pain, palpitations, lightheadedness or dizziness.  He denies any headaches.  Overall he feels good.  He is using his CPAP nightly.  He plans on restarting regular exercise.  He thinks he is eating well overall.  Constipation: He does suffer from some constipation.  At times he will eat prunes or apricots and he knows that that helps, but he does not eat them or take anything consistently for his constipation.  Acute kidney injury: Occurred during his hospitalization in October.  He was told that his kidney function decreased, but was not sure how much.  He does drink water during the day and does not take any nsaids.    Medications and allergies reviewed with patient and updated if appropriate.  Patient Active Problem List   Diagnosis Date Noted  . OSA (obstructive  sleep apnea) 02/05/2018  . S/P cardiac cath 01/22/18 stable -treating medically  01/23/2018  . Atrial tachycardia ([place]) 01/22/2018  . Non-ST elevation (NSTEMI) myocardial infarction ([place]) 01/21/2018  . Chronic systolic CHF (congestive heart failure) ([place]) 01/21/2018  . Atrial flutter with rapid ventricular response ([place]) 01/20/2018  . Sleep difficulties 07/29/2015  . Erectile disorder due to medical condition in male patient 12/17/2014  . Severe aortic valve stenosis - s/p  Edwards Sapien 3 THV (size 26 mm, model # 9600TFX, serial # [id number]) 03/23/2014 03/23/2014  . Abnormal nuclear stress test 01/29/2014  . Ischemic cardiomyopathy Jan 13, 2012  . 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 Back pain 07/10/2010  . Hypercholesteremia 07/10/2010  . Benign hypertensive heart disease without heart failure 07/10/2010  . S/P CABG (coronary artery bypass graft) 1982 and re-do 1994  07/10/2010  . Implantable cardioverter-defibrillator (ICD) in situ 03/30/2010  . Atrial fibrillation ([place]) 12-Mar-202011    Current Outpatient Medications on File Prior to Visit  Medication Sig Dispense Refill  . amiodarone (PACERONE) 200 MG tablet Take 1 tablet (200 mg total) by mouth daily. 30 tablet 6  . amLODipine (NORVASC) 5 MG tablet TAKE 1 TABLET BY MOUTH DAILY. (Patient taking differently: Take 5 mg by mouth daily. ) 90 tablet 3  . amoxicillin (AMOXIL) 500 MG tablet Take 4 tablets by mouth 60 minutes prior to dental appointments 4 tablet 4  .  Calcium Carbonate Antacid (TUMS PO) Take 2 tablets by mouth 2 (two) times daily as needed (indigestion/ acid reflux).    . Coenzyme Q10-Vitamin E (QUNOL ULTRA COQ10 PO) Take 15 mLs by mouth at bedtime.     . diazepam (VALIUM) 5 MG tablet Take 1 tablet (5 mg total) by mouth daily as needed. (Patient taking differently: Take 5 mg by mouth at bedtime as needed (for sleep). ) 30 tablet 2  . furosemide (LASIX) 40 MG tablet Take 1 tablet (40 mg total) by mouth daily. 90 tablet 3  .  ketotifen (ZADITOR) 0.025 % ophthalmic solution Place 1 drop into both eyes daily as needed (seasonal allergies).    Marland Kitchen KRILL OIL PO Take 1 capsule by mouth at bedtime.     Marland Kitchen latanoprost (XALATAN) 0.005 % ophthalmic solution Place 1 drop into both eyes at bedtime.     Marland Kitchen lisinopril (PRINIVIL,ZESTRIL) 10 MG tablet Take 1 tablet (10 mg total) by mouth daily. 30 tablet 1  . Magnesium Oxide (MAG-OXIDE PO) Take 1 tablet by mouth at bedtime.     . Melatonin 1 MG TABS Take 2-3 mg by mouth at bedtime as needed (sleep).    . metoprolol tartrate (LOPRESSOR) 50 MG tablet Take 0.5 tablets (25 mg total) by mouth at bedtime.    . Multiple Vitamin (MULTIVITAMIN WITH MINERALS) TABS tablet Take 1 tablet by mouth daily.    . nitroGLYCERIN (NITROSTAT) 0.4 MG SL tablet Place 1 tablet (0.4 mg total) under the tongue every 5 (five) minutes as needed for chest pain (for chest pain (MAX of 3 doses)). 25 tablet 6  . Potassium 99 MG TABS Take 99 mg by mouth 2 (two) times daily.    Marland Kitchen Propylene Glycol (SYSTANE COMPLETE) 0.6 % SOLN Place 1 drop into both eyes daily as needed (dry eyes).     . Rivaroxaban (XARELTO) 15 MG TABS tablet Take 1 tablet (15 mg total) by mouth daily with supper. 30 tablet 5  . rosuvastatin (CRESTOR) 40 MG tablet TAKE 1 TABLET BY MOUTH DAILY. (Patient taking differently: Take 40 mg by mouth every evening. ) 90 tablet 3  . Thiamine HCl (VITAMIN B-1) 100 MG tablet Take 100 mg by mouth at bedtime.     . trospium (SANCTURA) 20 MG tablet Take 20 mg by mouth 2 (two) times daily.     No current facility-administered medications on file prior to visit.     Past Medical History:  Diagnosis Date  . Aortic stenosis, severe    s/p TAVR with Edwards Sapien 3 THV (size 26 mm, model # 9600TFX, serial # [id number])  . Arthritis   . Cardiac arrest ([place])   . Chronic systolic CHF (congestive heart failure) ([place]) 01/21/2018  . Claudication ([place])   . Coronary artery disease   . Essential hypertension   . Headache   .  History of hiatal hernia   . Hyperlipidemia   . Kidney stones   . Non-ST elevation (NSTEMI) myocardial infarction ([place]) 01/21/2018  . OSA (obstructive sleep apnea) 02/05/2018   Severe obstructive sleep apnea with an AHI of 36.8/h with no significant central sleep apnea.   Now on CPAP at  5 cm H2O  . Peripheral vascular disease ([place])   . S/P cardiac cath 01/22/18  01/23/2018    Past Surgical History:  Procedure Laterality Date  . ADENOIDECTOMY    . APPENDECTOMY    . CARDIAC CATHETERIZATION   11/16/2009   . CARDIAC DEFIBRILLATOR PLACEMENT    .  CARDIOVERSION N/A 02/24/2018   Procedure: CARDIOVERSION;  Surgeon: Sueanne Margarita, MD;  Location: [facility] ENDOSCOPY;  Service: Cardiovascular;  Laterality: N/A;  . cataract surgery      Bilateral cataract surgery  . CORONARY ARTERY BYPASS GRAFT     in 1982 and '84 with subsequent PTCI  . EP IMPLANTABLE DEVICE N/A 03/21/2016   Procedure: ICD Generator Changeout;  Surgeon: Deboraha Sprang, MD;  Location: [place] CV LAB;  Service: Cardiovascular;  Laterality: N/A;  . HERNIA REPAIR    . LEFT AND RIGHT HEART CATHETERIZATION WITH CORONARY/GRAFT ANGIOGRAM N/A 02/01/2014   Procedure: LEFT AND RIGHT HEART CATHETERIZATION WITH Beatrix Fetters;  Surgeon: Sinclair Grooms, MD;  Location: [facility] CATH LAB;  Service: Cardiovascular;  Laterality: N/A;  . left elbow tendon surgery    . LEFT HEART CATH AND CORS/GRAFTS ANGIOGRAPHY N/A 01/22/2018   Procedure: LEFT HEART CATH AND CORS/GRAFTS ANGIOGRAPHY;  Surgeon: Leonie Man, MD;  Location: [place] CV LAB;  Service: Cardiovascular;  Laterality: N/A;  . TEE WITHOUT CARDIOVERSION N/A 03/23/2014   Procedure: TRANSESOPHAGEAL ECHOCARDIOGRAM (TEE);  Surgeon: Burnell Blanks, MD;  Location: [place];  Service: Open Heart Surgery;  Laterality: N/A;  . TONSILLECTOMY    . TRANSCATHETER AORTIC VALVE REPLACEMENT, TRANSFEMORAL N/A 03/23/2014   Procedure: TRANSCATHETER AORTIC VALVE REPLACEMENT, TRANSFEMORAL;   Surgeon: Burnell Blanks, MD;  Location: [place] [facility];  Service: Open Heart Surgery;  Laterality: N/A;    Social History   Socioeconomic History  . Marital status: Married    Spouse name: Not on file  . Number of children: 3  . Years of education: Not on file  . Highest education level: Not on file  Occupational History  . Occupation: Retired  Scientific laboratory technician  . Financial resource strain: Not hard at all  . Food insecurity:    Worry: Never true    Inability: Never true  . Transportation needs:    Medical: No    Non-medical: No  Tobacco Use  . Smoking status: Former Smoker    Last attempt to quit: 10/25/1952    Years since quitting: 65.3  . Smokeless tobacco: Never Used  Substance and Sexual Activity  . Alcohol use: Yes    Alcohol/week: 5.0 - 6.0 standard drinks    Types: 5 - 6 Glasses of wine per week    Comment: 5-6 times a week  . Drug use: No  . Sexual activity: Never  Lifestyle  . Physical activity:    Days per week: Not on file    Minutes per session: Not on file  . Stress: Only a little  Relationships  . Social connections:    Talks on phone: More than three times a week    Gets together: More than three times a week    Attends religious service: More than 4 times per year    Active member of club or organization: Not on file    Attends meetings of clubs or organizations: More than 4 times per year    Relationship status: Married  Other Topics Concern  . Not on file  Social History Narrative  . Not on file    Family History  Problem Relation Age of Onset  . Heart attack Father 96  . Coronary artery disease Other        family history is dignificant for early CAD in several members    Review of Systems  Constitutional: Positive for fatigue. Negative for chills and fever.  Respiratory: Negative  for cough, shortness of breath and wheezing.   Cardiovascular: Negative for chest pain, palpitations and leg swelling.  Gastrointestinal: Positive for  constipation. Negative for abdominal pain, blood in stool, diarrhea and nausea.       GERD occasionally, 1-2 / month  Genitourinary: Negative for hematuria.  Neurological: Negative for light-headedness and headaches.  Hematological: Bruises/bleeds easily.       Objective:   Vitals:   03/03/18 0936  BP: (!) 162/80  Pulse: 83  Resp: 16  Temp: 98.2 F (36.8 C)  SpO2: 96%   BP Readings from Last 3 Encounters:  03/03/18 (!) 162/80  02/24/18 (!) 119/55  02/18/18 120/60   Wt Readings from Last 3 Encounters:  03/03/18 158 lb (71.7 kg)  02/24/18 155 lb (70.3 kg)  02/18/18 158 lb 3.2 oz (71.8 kg)   Body mass index is 25.12 kg/m.   Physical Exam  Constitutional: He is oriented to person, place, and time. He appears well-developed and well-nourished. No distress.  HENT:  Head: Normocephalic and atraumatic.  Neck: Neck supple. No tracheal deviation present. No thyromegaly present.  Cardiovascular: Normal rate.  Murmur (2/6 systolic) heard. Irregular rhythm  Pulmonary/Chest: Effort normal and breath sounds normal. No respiratory distress. He has no wheezes. He has no rales.  Abdominal: Soft. He exhibits no distension. There is no tenderness.  Musculoskeletal: He exhibits no edema.  Lymphadenopathy:    He has no cervical adenopathy.  Neurological: He is alert and oriented to person, place, and time.  Skin: Skin is warm and dry. He is not diaphoretic.           Assessment & Plan:    See Problem List for Assessment and Plan of chronic medical problems.

## 2018-03-03 ENCOUNTER — Other Ambulatory Visit (INDEPENDENT_AMBULATORY_CARE_PROVIDER_SITE_OTHER): Payer: Medicare Other

## 2018-03-03 ENCOUNTER — Ambulatory Visit (INDEPENDENT_AMBULATORY_CARE_PROVIDER_SITE_OTHER): Payer: Medicare Other | Admitting: Internal Medicine

## 2018-03-03 ENCOUNTER — Encounter: Payer: Self-pay | Admitting: Internal Medicine

## 2018-03-03 VITALS — BP 162/80 | HR 83 | Temp 98.2°F | Resp 16 | Ht 66.5 in | Wt 158.0 lb

## 2018-03-03 DIAGNOSIS — K5900 Constipation, unspecified: Secondary | ICD-10-CM | POA: Diagnosis not present

## 2018-03-03 DIAGNOSIS — N179 Acute kidney failure, unspecified: Secondary | ICD-10-CM | POA: Diagnosis not present

## 2018-03-03 DIAGNOSIS — D649 Anemia, unspecified: Secondary | ICD-10-CM

## 2018-03-03 DIAGNOSIS — I255 Ischemic cardiomyopathy: Secondary | ICD-10-CM | POA: Diagnosis not present

## 2018-03-03 LAB — CBC WITH DIFFERENTIAL/PLATELET
BASOS ABS: 0.1 10*3/uL (ref 0.0–0.1)
BASOS PCT: 0.9 % (ref 0.0–3.0)
Eosinophils Absolute: 0.2 10*3/uL (ref 0.0–0.7)
Eosinophils Relative: 2.8 % (ref 0.0–5.0)
HEMATOCRIT: 34.1 % — AB (ref 39.0–52.0)
HEMOGLOBIN: 10.9 g/dL — AB (ref 13.0–17.0)
LYMPHS PCT: 25.1 % (ref 12.0–46.0)
Lymphs Abs: 1.8 10*3/uL (ref 0.7–4.0)
MCHC: 31.9 g/dL (ref 30.0–36.0)
MCV: 80.6 fl (ref 78.0–100.0)
Monocytes Absolute: 0.7 10*3/uL (ref 0.1–1.0)
Monocytes Relative: 10 % (ref 3.0–12.0)
Neutro Abs: 4.4 10*3/uL (ref 1.4–7.7)
Neutrophils Relative %: 61.2 % (ref 43.0–77.0)
Platelets: 199 10*3/uL (ref 150.0–400.0)
RBC: 4.23 Mil/uL (ref 4.22–5.81)
RDW: 15.6 % — AB (ref 11.5–15.5)
WBC: 7.1 10*3/uL (ref 4.0–10.5)

## 2018-03-03 LAB — COMPREHENSIVE METABOLIC PANEL
ALK PHOS: 50 U/L (ref 39–117)
ALT: 13 U/L (ref 0–53)
AST: 16 U/L (ref 0–37)
Albumin: 4.8 g/dL (ref 3.5–5.2)
BILIRUBIN TOTAL: 0.5 mg/dL (ref 0.2–1.2)
BUN: 35 mg/dL — ABNORMAL HIGH (ref 6–23)
CALCIUM: 11.3 mg/dL — AB (ref 8.4–10.5)
CO2: 25 mEq/L (ref 19–32)
Chloride: 106 mEq/L (ref 96–112)
Creatinine, Ser: 1.95 mg/dL — ABNORMAL HIGH (ref 0.40–1.50)
GFR: 35.18 mL/min — AB (ref >60.00)
GLUCOSE: 104 mg/dL — AB (ref 70–99)
POTASSIUM: 4.5 meq/L (ref 3.5–5.1)
Sodium: 141 mEq/L (ref 135–145)
Total Protein: 8.1 g/dL (ref 6.0–8.3)

## 2018-03-03 NOTE — Assessment & Plan Note (Signed)
New after his hospitalization in October Some of the anemia is likely hemodilution from fluids Recheck CBC, iron panel Would like to avoid adding iron since he already suffers from constipation No evidence of GI bleeding-we will check fecal occult Further evaluation versus close monitoring depending on results today

## 2018-03-03 NOTE — Assessment & Plan Note (Signed)
Chronic Discussed that he can start eating prunes or apricots on a daily basis to see if that helps Can also add Metamucil on a daily basis

## 2018-03-03 NOTE — Assessment & Plan Note (Signed)
Occurred during hospitalization in October Likely from MI or cardiac cath - hopefully it will improve with time Continue increased water intake cmp

## 2018-03-03 NOTE — Patient Instructions (Signed)
  Tests ordered today. Your results will be released to [facility] (or called to you) after review, usually within 72hours after test completion. If any changes need to be made, you will be notified at that same time.  Try eating prunes or apricots regularly for your constipation.  You can also try metamucil daily.    Medications reviewed and updated.  Changes include :   none

## 2018-03-04 LAB — IRON,TIBC AND FERRITIN PANEL
%SAT: 8 % — AB (ref 20–48)
FERRITIN: 11 ng/mL — AB (ref 24–380)
Iron: 37 ug/dL — ABNORMAL LOW (ref 50–180)
TIBC: 490 ug/dL — AB (ref 250–425)

## 2018-03-04 LAB — RETICULOCYTES
ABS RETIC: 67040 {cells}/uL (ref 25000–9000)
Retic Ct Pct: 1.6 %

## 2018-03-10 ENCOUNTER — Other Ambulatory Visit: Payer: Self-pay | Admitting: Internal Medicine

## 2018-03-10 DIAGNOSIS — R944 Abnormal results of kidney function studies: Secondary | ICD-10-CM

## 2018-03-18 ENCOUNTER — Telehealth: Payer: Self-pay | Admitting: Internal Medicine

## 2018-03-18 DIAGNOSIS — N179 Acute kidney failure, unspecified: Secondary | ICD-10-CM

## 2018-03-18 DIAGNOSIS — G4733 Obstructive sleep apnea (adult) (pediatric): Secondary | ICD-10-CM

## 2018-03-18 DIAGNOSIS — D649 Anemia, unspecified: Secondary | ICD-10-CM

## 2018-03-18 NOTE — Telephone Encounter (Signed)
Blood pressure looks good.It does take a while to hear from the kidney specialist   It may not be a bad idea to recheck his kidney function and blood counts if he does not mind coming in for more blood work-this week or the beginning of next week.  I will order it just in case he does want to have it rechecked.

## 2018-03-18 NOTE — Telephone Encounter (Signed)
Copied from [place] [id number]. Topic: General - Other >> Mar 18, 2018  2:25 PM Lennox Solders wrote: Reason for CRM: pt is calling back with bp readings 12/3 124/57 pulse 63 in morning, afternoon 118/57 pulse 60, 12/4 138/67 pulse 73 at 1025 am , 12/10 142/67 at 142 pm and pulse 60. Pt is checking also on nephrologist . Pt has stop lisinopril on 03-11-18

## 2018-03-19 NOTE — Telephone Encounter (Signed)
Pt aware of response. Will come back in sometime this week or next week for repeat blood work.

## 2018-03-20 ENCOUNTER — Other Ambulatory Visit (INDEPENDENT_AMBULATORY_CARE_PROVIDER_SITE_OTHER): Payer: Medicare Other

## 2018-03-20 ENCOUNTER — Encounter: Payer: Self-pay | Admitting: Internal Medicine

## 2018-03-20 DIAGNOSIS — D649 Anemia, unspecified: Secondary | ICD-10-CM

## 2018-03-20 DIAGNOSIS — N179 Acute kidney failure, unspecified: Secondary | ICD-10-CM | POA: Diagnosis not present

## 2018-03-20 LAB — CBC WITH DIFFERENTIAL/PLATELET
BASOS ABS: 0.1 10*3/uL (ref 0.0–0.1)
Basophils Relative: 1 % (ref 0.0–3.0)
Eosinophils Absolute: 0.2 10*3/uL (ref 0.0–0.7)
Eosinophils Relative: 1.7 % (ref 0.0–5.0)
HCT: 34 % — ABNORMAL LOW (ref 39.0–52.0)
Hemoglobin: 10.7 g/dL — ABNORMAL LOW (ref 13.0–17.0)
Lymphocytes Relative: 18.9 % (ref 12.0–46.0)
Lymphs Abs: 1.7 10*3/uL (ref 0.7–4.0)
MCHC: 31.4 g/dL (ref 30.0–36.0)
MCV: 78.5 fl (ref 78.0–100.0)
Monocytes Absolute: 0.7 10*3/uL (ref 0.1–1.0)
Monocytes Relative: 8.3 % (ref 3.0–12.0)
NEUTROS PCT: 70.1 % (ref 43.0–77.0)
Neutro Abs: 6.2 10*3/uL (ref 1.4–7.7)
PLATELETS: 203 10*3/uL (ref 150.0–400.0)
RBC: 4.33 Mil/uL (ref 4.22–5.81)
RDW: 16.7 % — ABNORMAL HIGH (ref 11.5–15.5)
WBC: 8.8 10*3/uL (ref 4.0–10.5)

## 2018-03-20 LAB — COMPREHENSIVE METABOLIC PANEL
ALBUMIN: 4.8 g/dL (ref 3.5–5.2)
ALT: 15 U/L (ref 0–53)
AST: 20 U/L (ref 0–37)
Alkaline Phosphatase: 46 U/L (ref 39–117)
BILIRUBIN TOTAL: 0.5 mg/dL (ref 0.2–1.2)
BUN: 26 mg/dL — AB (ref 6–23)
CHLORIDE: 104 meq/L (ref 96–112)
CO2: 25 mEq/L (ref 19–32)
Calcium: 11 mg/dL — ABNORMAL HIGH (ref 8.4–10.5)
Creatinine, Ser: 1.6 mg/dL — ABNORMAL HIGH (ref 0.40–1.50)
GFR: 44.19 mL/min — ABNORMAL LOW (ref >60.00)
Glucose, Bld: 99 mg/dL (ref 70–99)
Potassium: 4.8 mEq/L (ref 3.5–5.1)
Sodium: 139 mEq/L (ref 135–145)
TOTAL PROTEIN: 7.8 g/dL (ref 6.0–8.3)

## 2018-03-28 ENCOUNTER — Other Ambulatory Visit (INDEPENDENT_AMBULATORY_CARE_PROVIDER_SITE_OTHER): Payer: Medicare Other

## 2018-03-28 DIAGNOSIS — D649 Anemia, unspecified: Secondary | ICD-10-CM

## 2018-03-28 LAB — FECAL OCCULT BLOOD, IMMUNOCHEMICAL: Fecal Occult Bld: NEGATIVE

## 2018-03-29 ENCOUNTER — Encounter: Payer: Self-pay | Admitting: Internal Medicine

## 2018-04-03 ENCOUNTER — Ambulatory Visit (INDEPENDENT_AMBULATORY_CARE_PROVIDER_SITE_OTHER): Payer: Medicare Other

## 2018-04-03 DIAGNOSIS — I469 Cardiac arrest, cause unspecified: Secondary | ICD-10-CM | POA: Diagnosis not present

## 2018-04-04 NOTE — Progress Notes (Signed)
Remote ICD transmission.   

## 2018-04-06 LAB — CUP PACEART REMOTE DEVICE CHECK
Battery Remaining Longevity: 91 mo
Battery Voltage: 2.99 V
Brady Statistic AP VP Percent: 5.94 %
Brady Statistic AS VS Percent: 26.13 %
Date Time Interrogation Session: 20191227013123
HighPow Impedance: 35 Ohm
HighPow Impedance: 42 Ohm
Implantable Lead Implant Date: 20110601
Implantable Lead Location: 753860
Implantable Lead Model: 5076
Implantable Pulse Generator Implant Date: 20171213
Lead Channel Impedance Value: 304 Ohm
Lead Channel Pacing Threshold Pulse Width: 0.4 ms
Lead Channel Sensing Intrinsic Amplitude: 0.875 mV
Lead Channel Sensing Intrinsic Amplitude: 1.75 mV
Lead Channel Sensing Intrinsic Amplitude: 1.75 mV
Lead Channel Setting Pacing Amplitude: 1.5 V
Lead Channel Setting Pacing Amplitude: 3 V
Lead Channel Setting Pacing Pulse Width: 0.4 ms
MDC IDC LEAD IMPLANT DT: 20110601
MDC IDC LEAD LOCATION: 753859
MDC IDC MSMT LEADCHNL RA IMPEDANCE VALUE: 456 Ohm
MDC IDC MSMT LEADCHNL RA PACING THRESHOLD AMPLITUDE: 0.625 V
MDC IDC MSMT LEADCHNL RA SENSING INTR AMPL: 0.875 mV
MDC IDC MSMT LEADCHNL RV IMPEDANCE VALUE: 247 Ohm
MDC IDC MSMT LEADCHNL RV PACING THRESHOLD AMPLITUDE: 1.5 V
MDC IDC MSMT LEADCHNL RV PACING THRESHOLD PULSEWIDTH: 0.4 ms
MDC IDC SET LEADCHNL RV SENSING SENSITIVITY: 0.3 mV
MDC IDC STAT BRADY AP VS PERCENT: 65.97 %
MDC IDC STAT BRADY AS VP PERCENT: 1.96 %
MDC IDC STAT BRADY RA PERCENT PACED: 69.96 %
MDC IDC STAT BRADY RV PERCENT PACED: 8.19 %

## 2018-04-16 DIAGNOSIS — N5201 Erectile dysfunction due to arterial insufficiency: Secondary | ICD-10-CM | POA: Diagnosis not present

## 2018-04-16 DIAGNOSIS — N3941 Urge incontinence: Secondary | ICD-10-CM | POA: Diagnosis not present

## 2018-04-16 DIAGNOSIS — R351 Nocturia: Secondary | ICD-10-CM | POA: Diagnosis not present

## 2018-04-17 ENCOUNTER — Ambulatory Visit (INDEPENDENT_AMBULATORY_CARE_PROVIDER_SITE_OTHER): Payer: Medicare Other | Admitting: Nurse Practitioner

## 2018-04-17 VITALS — BP 122/64 | HR 67 | Ht 66.5 in | Wt 156.0 lb

## 2018-04-17 DIAGNOSIS — I4819 Other persistent atrial fibrillation: Secondary | ICD-10-CM | POA: Diagnosis not present

## 2018-04-17 DIAGNOSIS — I119 Hypertensive heart disease without heart failure: Secondary | ICD-10-CM | POA: Diagnosis not present

## 2018-04-17 DIAGNOSIS — I2510 Atherosclerotic heart disease of native coronary artery without angina pectoris: Secondary | ICD-10-CM

## 2018-04-17 DIAGNOSIS — I5022 Chronic systolic (congestive) heart failure: Secondary | ICD-10-CM | POA: Diagnosis not present

## 2018-04-17 LAB — CUP PACEART INCLINIC DEVICE CHECK
Date Time Interrogation Session: 20200109113050
Implantable Lead Implant Date: 20110601
Implantable Lead Location: 753860
Implantable Lead Model: 5076
Implantable Lead Model: 6947
MDC IDC LEAD IMPLANT DT: 20110601
MDC IDC LEAD LOCATION: 753859
MDC IDC PG IMPLANT DT: 20171213

## 2018-04-17 NOTE — Patient Instructions (Signed)
Medication Instructions:  NONE If you need a refill on your cardiac medications before your next appointment, please call your pharmacy.   Lab work: NONE If you have labs (blood work) drawn today and your tests are completely normal, you will receive your results only by: Marland Kitchen MyChart Message (if you have MyChart) OR . A paper copy in the mail If you have any lab test that is abnormal or we need to change your treatment, we will call you to review the results.  Testing/Procedures: NONE  Follow-Up: At [facility], you and your health needs are our priority.  As part of our continuing mission to provide you with exceptional heart care, we have created designated Provider Care Teams.  These Care Teams include your primary Cardiologist (physician) and Advanced Practice Providers (APPs -  Physician Assistants and Nurse Practitioners) who all work together to provide you with the care you need, when you need it. You will need a follow up appointment in 6 months.  Please call our office 2 months in advance to schedule this appointment.  You may see Virl Axe, MD or one of the following Advanced Practice Providers on your designated Care Team:   Chanetta Marshall, NP . Tommye Standard, PA-C  Any Other Special Instructions Will Be Listed Below (If Applicable). Remote monitoring is used to monitor your ICD from home. This monitoring reduces the number of office visits required to check your device to one time per year. It allows [place] to keep an eye on the functioning of your device to ensure it is working properly. You are scheduled for a device check from home on 07/03/2018. You may send your transmission at any time that day. If you have a wireless device, the transmission will be sent automatically. After your physician reviews your transmission, you will receive a postcard with your next transmission date.

## 2018-04-17 NOTE — Progress Notes (Signed)
Electrophysiology Office Note Date: 04/17/2018  ID:  [name], DOB September 12, 1933, MRN [id number]  PCP: Binnie Rail, MD Electrophysiologist: Caryl Comes  CC: Routine ICD follow-up  [name] is a 85 y.o. male seen today for Dr Caryl Comes.  He presents today for routine electrophysiology followup.  Since last being seen in our clinic, the patient reports doing very well. He has not noted any more symptomatic atrial arrhythmias since cardioversion. He goes to the gym every other day and walks on the treadmill, does the rowing machine, and uses light weights. He denies chest pain, palpitations, dyspnea, PND, orthopnea, nausea, vomiting, dizziness, syncope, edema, weight gain, or early satiety.  He has not had ICD shocks.   Device History: MDT dual chamber ICD implanted 2011 for VF; gen change 2017 History of appropriate therapy: Yes History of AAD therapy: Yes - amiodarone   Past Medical History:  Diagnosis Date  . Aortic stenosis, severe    s/p TAVR with Edwards Sapien 3 THV (size 26 mm, model # 9600TFX, serial # [id number])  . Arthritis   . Cardiac arrest ([place])   . Chronic systolic CHF (congestive heart failure) ([place]) 01/21/2018  . Claudication ([place])   . Coronary artery disease   . Essential hypertension   . Headache   . History of hiatal hernia   . Hyperlipidemia   . Kidney stones   . Non-ST elevation (NSTEMI) myocardial infarction ([place]) 01/21/2018  . OSA (obstructive sleep apnea) 02/05/2018   Severe obstructive sleep apnea with an AHI of 36.8/h with no significant central sleep apnea.   Now on CPAP at  5 cm H2O  . Peripheral vascular disease ([place])   . S/P cardiac cath 01/22/18  01/23/2018   Past Surgical History:  Procedure Laterality Date  . ADENOIDECTOMY    . APPENDECTOMY    . CARDIAC CATHETERIZATION   11/16/2009   . CARDIAC DEFIBRILLATOR PLACEMENT    . CARDIOVERSION N/A 02/24/2018   Procedure: CARDIOVERSION;  Surgeon: Sueanne Margarita, MD;  Location[facility] ENDOSCOPY;   Service: Cardiovascular;  Laterality: N/A;  . cataract surgery      Bilateral cataract surgery  . CORONARY ARTERY BYPASS GRAFT     in 1982 and '84 with subsequent PTCI  . EP IMPLANTABLE DEVICE N/A 03/21/2016   Procedure: ICD Generator Changeout;  Surgeon: Deboraha Sprang, MD;  Location: [place] CV LAB;  Service: Cardiovascular;  Laterality: N/A;  . HERNIA REPAIR    . LEFT AND RIGHT HEART CATHETERIZATION WITH CORONARY/GRAFT ANGIOGRAM N/A 02/01/2014   Procedure: LEFT AND RIGHT HEART CATHETERIZATION WITH Beatrix Fetters;  Surgeon: Sinclair Grooms, MD;  Location: [facility] CATH LAB;  Service: Cardiovascular;  Laterality: N/A;  . left elbow tendon surgery    . LEFT HEART CATH AND CORS/GRAFTS ANGIOGRAPHY N/A 01/22/2018   Procedure: LEFT HEART CATH AND CORS/GRAFTS ANGIOGRAPHY;  Surgeon: Leonie Man, MD;  Location: [place] CV LAB;  Service: Cardiovascular;  Laterality: N/A;  . TEE WITHOUT CARDIOVERSION N/A 03/23/2014   Procedure: TRANSESOPHAGEAL ECHOCARDIOGRAM (TEE);  Surgeon: Burnell Blanks, MD;  Location: [place];  Service: Open Heart Surgery;  Laterality: N/A;  . TONSILLECTOMY    . TRANSCATHETER AORTIC VALVE REPLACEMENT, TRANSFEMORAL N/A 03/23/2014   Procedure: TRANSCATHETER AORTIC VALVE REPLACEMENT, TRANSFEMORAL;  Surgeon: Burnell Blanks, MD;  Location: [place];  Service: Open Heart Surgery;  Laterality: N/A;    Current Outpatient Medications  Medication Sig Dispense Refill  . amiodarone (PACERONE) 200 MG tablet Take 1 tablet (  200 mg total) by mouth daily. 30 tablet 6  . amLODipine (NORVASC) 5 MG tablet TAKE 1 TABLET BY MOUTH DAILY. (Patient taking differently: Take 5 mg by mouth daily. ) 90 tablet 3  . amoxicillin (AMOXIL) 500 MG tablet Take 4 tablets by mouth 60 minutes prior to dental appointments 4 tablet 4  . Calcium Carbonate Antacid (TUMS PO) Take 2 tablets by mouth 2 (two) times daily as needed (indigestion/ acid reflux).    . Coenzyme Q10-Vitamin E (QUNOL  ULTRA COQ10 PO) Take 15 mLs by mouth at bedtime.     . diazepam (VALIUM) 5 MG tablet Take 1 tablet (5 mg total) by mouth daily as needed. (Patient taking differently: Take 5 mg by mouth at bedtime as needed (for sleep). ) 30 tablet 2  . furosemide (LASIX) 40 MG tablet Take 1 tablet (40 mg total) by mouth daily. 90 tablet 3  . ketotifen (ZADITOR) 0.025 % ophthalmic solution Place 1 drop into both eyes daily as needed (seasonal allergies).    Marland Kitchen KRILL OIL PO Take 1 capsule by mouth at bedtime.     Marland Kitchen latanoprost (XALATAN) 0.005 % ophthalmic solution Place 1 drop into both eyes at bedtime.     . Magnesium Oxide (MAG-OXIDE PO) Take 1 tablet by mouth at bedtime.     . Melatonin 1 MG TABS Take 2-3 mg by mouth at bedtime as needed (sleep).    . metoprolol tartrate (LOPRESSOR) 50 MG tablet Take 0.5 tablets (25 mg total) by mouth at bedtime.    . Multiple Vitamin (MULTIVITAMIN WITH MINERALS) TABS tablet Take 1 tablet by mouth daily.    . nitroGLYCERIN (NITROSTAT) 0.4 MG SL tablet Place 1 tablet (0.4 mg total) under the tongue every 5 (five) minutes as needed for chest pain (for chest pain (MAX of 3 doses)). 25 tablet 6  . Potassium 99 MG TABS Take 99 mg by mouth 2 (two) times daily.    Marland Kitchen Propylene Glycol (SYSTANE COMPLETE) 0.6 % SOLN Place 1 drop into both eyes daily as needed (dry eyes).     . Rivaroxaban (XARELTO) 15 MG TABS tablet Take 1 tablet (15 mg total) by mouth daily with supper. 30 tablet 5  . rosuvastatin (CRESTOR) 40 MG tablet TAKE 1 TABLET BY MOUTH DAILY. (Patient taking differently: Take 40 mg by mouth every evening. ) 90 tablet 3  . Thiamine HCl (VITAMIN B-1) 100 MG tablet Take 100 mg by mouth at bedtime.     . trospium (SANCTURA) 20 MG tablet Take 20 mg by mouth 2 (two) times daily.     No current facility-administered medications for this visit.     Allergies:   Cyclobenzaprine hcl and Tetracycline   Social History: Social History   Socioeconomic History  . Marital status: Married     Spouse name: Not on file  . Number of children: 3  . Years of education: Not on file  . Highest education level: Not on file  Occupational History  . Occupation: Retired  Scientific laboratory technician  . Financial resource strain: Not hard at all  . Food insecurity:    Worry: Never true    Inability: Never true  . Transportation needs:    Medical: No    Non-medical: No  Tobacco Use  . Smoking status: Former Smoker    Last attempt to quit: 10/25/1952    Years since quitting: 65.5  . Smokeless tobacco: Never Used  Substance and Sexual Activity  . Alcohol use: Yes  Alcohol/week: 5.0 - 6.0 standard drinks    Types: 5 - 6 Glasses of wine per week    Comment: 5-6 times a week  . Drug use: No  . Sexual activity: Never  Lifestyle  . Physical activity:    Days per week: Not on file    Minutes per session: Not on file  . Stress: Only a little  Relationships  . Social connections:    Talks on phone: More than three times a week    Gets together: More than three times a week    Attends religious service: More than 4 times per year    Active member of club or organization: Not on file    Attends meetings of clubs or organizations: More than 4 times per year    Relationship status: Married  . Intimate partner violence:    Fear of current or ex partner: No    Emotionally abused: No    Physically abused: No    Forced sexual activity: No  Other Topics Concern  . Not on file  Social History Narrative  . Not on file    Family History: Family History  Problem Relation Age of Onset  . Heart attack Father 65  . Coronary artery disease Other        family history is dignificant for early CAD in several members    Review of Systems: All other systems reviewed and are otherwise negative except as noted above.   Physical Exam: VS:  BP 122/64   Pulse 67   Ht 5' 6.5" (1.689 m)   Wt 156 lb (70.8 kg)   BMI 24.80 kg/m  , BMI Body mass index is 24.8 kg/m.  GEN- The patient is elderly  appearing, alert and oriented x 3 today.   HEENT: normocephalic, atraumatic; sclera clear, conjunctiva pink; hearing intact; oropharynx clear; neck supple  Lungs- Clear to ausculation bilaterally, normal work of breathing.  No wheezes, rales, rhonchi Heart- Regular rate and rhythm  GI- soft, non-tender, non-distended, bowel sounds present  Extremities- no clubbing, cyanosis, or edema MS- no significant deformity or atrophy Skin- warm and dry, no rash or lesion; ICD pocket well healed Psych- euthymic mood, full affect Neuro- strength and sensation are intact  ICD interrogation- reviewed in detail today,  See PACEART report  EKG:  EKG is not ordered today.  Recent Labs: 01/20/2018: B Natriuretic Peptide 951.6; Magnesium 2.3 02/18/2018: TSH 2.530 03/20/2018: ALT 15; BUN 26; Creatinine, Ser 1.60; Hemoglobin 10.7; Platelets 203.0; Potassium 4.8; Sodium 139   Wt Readings from Last 3 Encounters:  04/17/18 156 lb (70.8 kg)  03/03/18 158 lb (71.7 kg)  02/24/18 155 lb (70.3 kg)     Other studies Reviewed: Additional studies/ records that were reviewed today include: Dr Olin Pia office notes   Assessment and Plan:  1.  Chronic systolic dysfunction euvolemic today Stable on an appropriate medical regimen Normal ICD function See Pace Art report No changes today  2.  Persistent atrial fibrillation/atrial tachycardia Maintaining SR post cardioversion on amiodarone with the exception of some atrial flutter at end of December Continue Xarelto for [facility] of 5 No bleeding complications Hgb stable by last check   3.  HTN Stable No change required today  4.  CAD s/p CABG No recent ischemic symptoms Continue current therapy    Current medicines are reviewed at length with the patient today.   The patient does not have concerns regarding his medicines.  The following changes were made today:  none  Labs/ tests ordered today include: none Orders Placed This Encounter  Procedures    . CUP PACEART INCLINIC DEVICE CHECK     Disposition:   Follow up with [facility], Dr Caryl Comes 6 months      Signed, Chanetta Marshall, NP 04/17/2018 11:52 AM  [place] [address] [phone] (office) [phone] (fax)

## 2018-05-15 ENCOUNTER — Encounter: Payer: Self-pay | Admitting: Cardiovascular Disease

## 2018-05-15 ENCOUNTER — Ambulatory Visit (INDEPENDENT_AMBULATORY_CARE_PROVIDER_SITE_OTHER): Payer: Medicare Other | Admitting: Cardiovascular Disease

## 2018-05-15 VITALS — BP 118/72 | HR 88 | Ht 66.5 in | Wt 159.8 lb

## 2018-05-15 DIAGNOSIS — I4819 Other persistent atrial fibrillation: Secondary | ICD-10-CM | POA: Diagnosis not present

## 2018-05-15 DIAGNOSIS — I359 Nonrheumatic aortic valve disorder, unspecified: Secondary | ICD-10-CM

## 2018-05-15 DIAGNOSIS — I5022 Chronic systolic (congestive) heart failure: Secondary | ICD-10-CM | POA: Diagnosis not present

## 2018-05-15 DIAGNOSIS — I2510 Atherosclerotic heart disease of native coronary artery without angina pectoris: Secondary | ICD-10-CM

## 2018-05-15 DIAGNOSIS — I255 Ischemic cardiomyopathy: Secondary | ICD-10-CM | POA: Diagnosis not present

## 2018-05-15 NOTE — Progress Notes (Signed)
Chief Complaint  Patient presents with  . Follow-up    Atrial flutter   History of Present Illness: 85 yo male with history of persistent atrial fibrillation, CAD s/p CABG (1982 with redo 1994), ischemic cardiomyopathy, out-of-hospital cardiac arrest in 2011 with successful resuscitation and subsequent placement of Medtronic ICD, and aortic stenosis s/p TAVR who is here today for cardiac follow up. I performed his TAVR procedure on 03/23/14.  Cardiac cath October 2015 with total occlusion of left main and RCA. Patent LIMA to LAD, patent SVG to OM, patent SVG to RCA. Of note, diffuse disease in LAD both before and after LIMA graft insertion. Echo April 2019 with NIDP=82-42%, grade 2 diastolic dysfunction. The bioprosthetic AVR is working well with trivial perivalvular leak, mean gradient 14 mmHg (10 mmHg by echo in 2017). He was admitted to [facility] in October 2019 with atrial flutter with RVR. He converted spontaneously to sinus on an amiodarone drip. Troponin was elevated. Cardiac cath 01/22/18 with stable CAD (all three grafts patent). The troponin elevation was felt to be due to demand ischemia. He was seen in follow up in the EP clinic by Dr. Caryl Comes November 2019. He remained in atrial tach. Cardioversion to sinus 02/24/18.   He is here today for follow up. The patient denies any chest pain, dyspnea, palpitations, lower extremity edema, orthopnea, PND, dizziness, near syncope or syncope. He does not sleep well at night due to restless legs.    Primary Care Physician: Binnie Rail, MD  Past Medical History:  Diagnosis Date  . Aortic stenosis, severe    s/p TAVR with Edwards Sapien 3 THV (size 26 mm, model # 9600TFX, serial # [id number])  . Arthritis   . Cardiac arrest ([place])   . Chronic systolic CHF (congestive heart failure) ([place]) 01/21/2018  . Claudication ([place])   . Coronary artery disease   . Essential hypertension   . Headache   . History of hiatal hernia   . Hyperlipidemia   . Kidney  stones   . Non-ST elevation (NSTEMI) myocardial infarction ([place]) 01/21/2018  . OSA (obstructive sleep apnea) 02/05/2018   Severe obstructive sleep apnea with an AHI of 36.8/h with no significant central sleep apnea.   Now on CPAP at  5 cm H2O  . Peripheral vascular disease ([place])   . S/P cardiac cath 01/22/18  01/23/2018    Past Surgical History:  Procedure Laterality Date  . ADENOIDECTOMY    . APPENDECTOMY    . CARDIAC CATHETERIZATION   11/16/2009   . CARDIAC DEFIBRILLATOR PLACEMENT    . CARDIOVERSION N/A 02/24/2018   Procedure: CARDIOVERSION;  Surgeon: Sueanne Margarita, MD;  Location: [facility] ENDOSCOPY;  Service: Cardiovascular;  Laterality: N/A;  . cataract surgery      Bilateral cataract surgery  . CORONARY ARTERY BYPASS GRAFT     in 1982 and '84 with subsequent PTCI  . EP IMPLANTABLE DEVICE N/A 03/21/2016   Procedure: ICD Generator Changeout;  Surgeon: Deboraha Sprang, MD;  Location: [place] CV LAB;  Service: Cardiovascular;  Laterality: N/A;  . HERNIA REPAIR    . LEFT AND RIGHT HEART CATHETERIZATION WITH CORONARY/GRAFT ANGIOGRAM N/A 02/01/2014   Procedure: LEFT AND RIGHT HEART CATHETERIZATION WITH Beatrix Fetters;  Surgeon: Sinclair Grooms, MD;  Location: [facility] CATH LAB;  Service: Cardiovascular;  Laterality: N/A;  . left elbow tendon surgery    . LEFT HEART CATH AND CORS/GRAFTS ANGIOGRAPHY N/A 01/22/2018   Procedure: LEFT HEART CATH AND CORS/GRAFTS ANGIOGRAPHY;  Surgeon:  Leonie Man, MD;  Location: [place] CV LAB;  Service: Cardiovascular;  Laterality: N/A;  . TEE WITHOUT CARDIOVERSION N/A 03/23/2014   Procedure: TRANSESOPHAGEAL ECHOCARDIOGRAM (TEE);  Surgeon: Burnell Blanks, MD;  Location: [place];  Service: Open Heart Surgery;  Laterality: N/A;  . TONSILLECTOMY    . TRANSCATHETER AORTIC VALVE REPLACEMENT, TRANSFEMORAL N/A 03/23/2014   Procedure: TRANSCATHETER AORTIC VALVE REPLACEMENT, TRANSFEMORAL;  Surgeon: Burnell Blanks, MD;  Location: [place];   Service: Open Heart Surgery;  Laterality: N/A;    Current Outpatient Medications  Medication Sig Dispense Refill  . amiodarone (PACERONE) 200 MG tablet Take 1 tablet (200 mg total) by mouth daily. 30 tablet 6  . amLODipine (NORVASC) 5 MG tablet TAKE 1 TABLET BY MOUTH DAILY. (Patient taking differently: Take 5 mg by mouth daily. ) 90 tablet 3  . amoxicillin (AMOXIL) 500 MG tablet Take 4 tablets by mouth 60 minutes prior to dental appointments 4 tablet 4  . Calcium Carbonate Antacid (TUMS PO) Take 2 tablets by mouth 2 (two) times daily as needed (indigestion/ acid reflux).    . Coenzyme Q10-Vitamin E (QUNOL ULTRA COQ10 PO) Take 15 mLs by mouth at bedtime.     . diazepam (VALIUM) 5 MG tablet Take 1 tablet (5 mg total) by mouth daily as needed. (Patient taking differently: Take 5 mg by mouth at bedtime as needed (for sleep). ) 30 tablet 2  . furosemide (LASIX) 40 MG tablet Take 1 tablet (40 mg total) by mouth daily. 90 tablet 3  . ketotifen (ZADITOR) 0.025 % ophthalmic solution Place 1 drop into both eyes daily as needed (seasonal allergies).    Marland Kitchen KRILL OIL PO Take 1 capsule by mouth at bedtime.     Marland Kitchen latanoprost (XALATAN) 0.005 % ophthalmic solution Place 1 drop into both eyes at bedtime.     . Magnesium Oxide (MAG-OXIDE PO) Take 1 tablet by mouth at bedtime.     . Melatonin 1 MG TABS Take 2-3 mg by mouth at bedtime as needed (sleep).    . metoprolol tartrate (LOPRESSOR) 50 MG tablet Take 0.5 tablets (25 mg total) by mouth at bedtime.    . Multiple Vitamin (MULTIVITAMIN WITH MINERALS) TABS tablet Take 1 tablet by mouth daily.    . nitroGLYCERIN (NITROSTAT) 0.4 MG SL tablet Place 1 tablet (0.4 mg total) under the tongue every 5 (five) minutes as needed for chest pain (for chest pain (MAX of 3 doses)). 25 tablet 6  . Potassium 99 MG TABS Take 99 mg by mouth 2 (two) times daily.    Marland Kitchen Propylene Glycol (SYSTANE COMPLETE) 0.6 % SOLN Place 1 drop into both eyes daily as needed (dry eyes).     .  Rivaroxaban (XARELTO) 15 MG TABS tablet Take 1 tablet (15 mg total) by mouth daily with supper. 30 tablet 5  . rosuvastatin (CRESTOR) 40 MG tablet TAKE 1 TABLET BY MOUTH DAILY. (Patient taking differently: Take 40 mg by mouth every evening. ) 90 tablet 3  . Thiamine HCl (VITAMIN B-1) 100 MG tablet Take 100 mg by mouth at bedtime.     . trospium (SANCTURA) 20 MG tablet Take 20 mg by mouth 2 (two) times daily.     No current facility-administered medications for this visit.     Allergies  Allergen Reactions  . Cyclobenzaprine Hcl Other (See Comments)    Unknown reaction  . Tetracycline Rash         Social History   Socioeconomic History  .  Marital status: Married    Spouse name: Not on file  . Number of children: 3  . Years of education: Not on file  . Highest education level: Not on file  Occupational History  . Occupation: Retired  Scientific laboratory technician  . Financial resource strain: Not hard at all  . Food insecurity:    Worry: Never true    Inability: Never true  . Transportation needs:    Medical: No    Non-medical: No  Tobacco Use  . Smoking status: Former Smoker    Last attempt to quit: 10/25/1952    Years since quitting: 65.5  . Smokeless tobacco: Never Used  Substance and Sexual Activity  . Alcohol use: Yes    Alcohol/week: 5.0 - 6.0 standard drinks    Types: 5 - 6 Glasses of wine per week    Comment: 5-6 times a week  . Drug use: No  . Sexual activity: Never  Lifestyle  . Physical activity:    Days per week: Not on file    Minutes per session: Not on file  . Stress: Only a little  Relationships  . Social connections:    Talks on phone: More than three times a week    Gets together: More than three times a week    Attends religious service: More than 4 times per year    Active member of club or organization: Not on file    Attends meetings of clubs or organizations: More than 4 times per year    Relationship status: Married  . Intimate partner violence:     Fear of current or ex partner: No    Emotionally abused: No    Physically abused: No    Forced sexual activity: No  Other Topics Concern  . Not on file  Social History Narrative  . Not on file    Family History  Problem Relation Age of Onset  . Heart attack Father 56  . Coronary artery disease Other        family history is dignificant for early CAD in several members    Review of Systems:  As stated in the HPI and otherwise negative.   BP 118/72   Pulse 88   Ht 5' 6.5" (1.689 m)   Wt 159 lb 12.8 oz (72.5 kg)   SpO2 92%   BMI 25.41 kg/m   Physical Examination:  General: Well developed, well nourished, NAD  HEENT: OP clear, mucus membranes moist  SKIN: warm, dry. No rashes. Neuro: No focal deficits  Musculoskeletal: Muscle strength 5/5 all ext  Psychiatric: Mood and affect normal  Neck: No JVD, no carotid bruits, no thyromegaly, no lymphadenopathy.  Lungs:Clear bilaterally, no wheezes, rhonci, crackles Cardiovascular: Regular rate and rhythm. No murmurs, gallops or rubs. Abdomen:Soft. Bowel sounds present. Non-tender.  Extremities: No lower extremity edema. Pulses are 2 + in the bilateral DP/PT.  Echo November 2019: .- Left ventricle: The cavity size was mildly dilated. Wall   thickness was normal. Systolic function was mildly to moderately   reduced. The estimated ejection fraction was in the range of 40%   to 45%. Hypokinesis of the inferolateral myocardium. - Ventricular septum: Septal motion showed paradox. - Aortic valve: A stent-valve (TAVR) bioprosthesis was present and   functioning normally. - Mitral valve: There was mild regurgitation directed centrally. - Left atrium: The atrium was mildly dilated. - Right ventricle: The cavity size was mildly dilated. - Right atrium: The atrium was moderately dilated.  Impressions:  -  Throughout most of the study, the rhythm is 2:1 AV block.  EKG:  EKG is not  ordered today. The ekg ordered today demonstrates    Recent Labs: 01/20/2018: B Natriuretic Peptide 951.6; Magnesium 2.3 02/18/2018: TSH 2.530 03/20/2018: ALT 15; BUN 26; Creatinine, Ser 1.60; Hemoglobin 10.7; Platelets 203.0; Potassium 4.8; Sodium 139   Lipid Panel    Component Value Date/Time   CHOL 130 01/21/2018 0447   CHOL 125 03/21/2017 0814   TRIG 209 (H) 01/21/2018 0447   HDL 30 (L) 01/21/2018 0447   HDL 37 (L) 03/21/2017 0814   CHOLHDL 4.3 01/21/2018 0447   VLDL 42 (H) 01/21/2018 0447   LDLCALC 58 01/21/2018 0447   LDLCALC 68 03/21/2017 0814     Wt Readings from Last 3 Encounters:  05/15/18 159 lb 12.8 oz (72.5 kg)  04/17/18 156 lb (70.8 kg)  03/03/18 158 lb (71.7 kg)     Other studies Reviewed: Additional studies/ records that were reviewed today include: . Review of the above records demonstrates:    Assessment and Plan:   1. Severe Aortic valve stenosis: He is s/p TAVR December 2015. His valve is working well by echo in November  2019. He is on Xarelto for atrial fib. Continue SBE prophylaxis prior to indicated procedures.   2. CAD s/p CABG without angina: He had CABG in 1982 and redo CABG in 1994. Last cath October 2019 with stable CAD/3 patent bypass grafts. Continue statin and beta blocker. He is not on ASA since he is on Eliquis.    3. Atrial fibrillation, persistent: Sinus today. He is now on amiodarone and metoprolol. He has been followed closely in the EP clinic by Dr. Caryl Comes and Chanetta Marshall, NP.    4. History of cardiac arrest: ICD in place.  This is followed by Dr. Caryl Comes  5. Ischemic cardiomyopathy/chronic systolic CHF: IRWE=31-54% when he was having sustained atrial tach in November 2019. CAD is stable. LV systolic function was normal in April 2019. Continue beta blocker. I will not start an ARB or Ace-inh given his chronic kidney disease.  5. HLD: Lipids controlled. Continue statin  6. Obstructive sleep apnea: He is using CPAP now.    Current medicines are reviewed at length with the patient  today.  The patient does not have concerns regarding medicines.  The following changes have been made:  no change  Labs/ tests ordered today include:   No orders of the defined types were placed in this encounter.  Disposition:   FU with me in 6 months.      Signed, Lauree Chandler, MD 05/15/2018 11:32 AM    [facility] [address], [place], [place]  [address] Phone: [phone]; Fax: [phone]

## 2018-05-15 NOTE — Patient Instructions (Signed)
Medication Instructions:  Your physician recommends that you continue on your current medications as directed. Please refer to the Current Medication list given to you today.  If you need a refill on your cardiac medications before your next appointment, please call your pharmacy.   Lab work: none If you have labs (blood work) drawn today and your tests are completely normal, you will receive your results only by: Marland Kitchen MyChart Message (if you have MyChart) OR . A paper copy in the mail If you have any lab test that is abnormal or we need to change your treatment, we will call you to review the results.  Testing/Procedures: none  Follow-Up: At [facility], you and your health needs are our priority.  As part of our continuing mission to provide you with exceptional heart care, we have created designated Provider Care Teams.  These Care Teams include your primary Cardiologist (physician) and Advanced Practice Providers (APPs -  Physician Assistants and Nurse Practitioners) who all work together to provide you with the care you need, when you need it. You will need a follow up appointment in 6 months.  Please call our office 3 months in advance to schedule this appointment.  You may see Lauree Chandler, MD or one of the following Advanced Practice Providers on your designated Care Team:   [place], PA-C Melina Copa, PA-C . Ermalinda Barrios, PA-C  Any Other Special Instructions Will Be Listed Below (If Applicable).

## 2018-05-19 ENCOUNTER — Other Ambulatory Visit: Payer: Self-pay | Admitting: Cardiovascular Disease

## 2018-05-19 DIAGNOSIS — F419 Anxiety disorder, unspecified: Secondary | ICD-10-CM

## 2018-05-19 NOTE — Telephone Encounter (Signed)
OK with me [name]  

## 2018-05-19 NOTE — Telephone Encounter (Signed)
Pt' pharmacy is requesting a refill on diazepam. Would Dr. Angelena Form like to refill this medication? Please address

## 2018-05-19 NOTE — Telephone Encounter (Signed)
Refill called to pharmacy.

## 2018-05-28 ENCOUNTER — Telehealth: Payer: Self-pay | Admitting: Cardiovascular Disease

## 2018-05-28 NOTE — Telephone Encounter (Signed)
I returned call and spoke with pt's wife.  She reports they received message to call office. I told her I did not see any calls placed to pt.  Pt's wife has no questions.

## 2018-05-28 NOTE — Telephone Encounter (Signed)
Patient states he is returning call for test results.

## 2018-05-28 NOTE — Telephone Encounter (Signed)
Pt recently seen by Dr. Angelena Form 05/15/18.

## 2018-06-10 DIAGNOSIS — L821 Other seborrheic keratosis: Secondary | ICD-10-CM | POA: Diagnosis not present

## 2018-06-10 DIAGNOSIS — Z85828 Personal history of other malignant neoplasm of skin: Secondary | ICD-10-CM | POA: Diagnosis not present

## 2018-06-10 DIAGNOSIS — L570 Actinic keratosis: Secondary | ICD-10-CM | POA: Diagnosis not present

## 2018-06-10 DIAGNOSIS — D1801 Hemangioma of skin and subcutaneous tissue: Secondary | ICD-10-CM | POA: Diagnosis not present

## 2018-07-03 ENCOUNTER — Other Ambulatory Visit: Payer: Self-pay

## 2018-07-03 ENCOUNTER — Ambulatory Visit (INDEPENDENT_AMBULATORY_CARE_PROVIDER_SITE_OTHER): Payer: Medicare Other | Admitting: *Deleted

## 2018-07-03 DIAGNOSIS — I255 Ischemic cardiomyopathy: Secondary | ICD-10-CM

## 2018-07-03 LAB — CUP PACEART REMOTE DEVICE CHECK
Battery Remaining Longevity: 85 mo
Battery Voltage: 2.96 V
Brady Statistic AP VP Percent: 12.6 %
Brady Statistic RA Percent Paced: 64.7 %
Date Time Interrogation Session: 20200326122723
HIGH POWER IMPEDANCE MEASURED VALUE: 36 Ohm
HighPow Impedance: 42 Ohm
Implantable Lead Implant Date: 20110601
Implantable Lead Location: 753859
Implantable Lead Model: 5076
Implantable Pulse Generator Implant Date: 20171213
Lead Channel Impedance Value: 247 Ohm
Lead Channel Impedance Value: 304 Ohm
Lead Channel Pacing Threshold Amplitude: 1.625 V
Lead Channel Pacing Threshold Pulse Width: 0.4 ms
Lead Channel Sensing Intrinsic Amplitude: 0.5 mV
Lead Channel Sensing Intrinsic Amplitude: 4.125 mV
Lead Channel Setting Pacing Amplitude: 3.25 V
Lead Channel Setting Pacing Pulse Width: 0.4 ms
Lead Channel Setting Sensing Sensitivity: 0.3 mV
MDC IDC LEAD IMPLANT DT: 20110601
MDC IDC LEAD LOCATION: 753860
MDC IDC MSMT LEADCHNL RA IMPEDANCE VALUE: 418 Ohm
MDC IDC MSMT LEADCHNL RA PACING THRESHOLD AMPLITUDE: 0.625 V
MDC IDC MSMT LEADCHNL RA SENSING INTR AMPL: 0.5 mV
MDC IDC MSMT LEADCHNL RV PACING THRESHOLD PULSEWIDTH: 0.4 ms
MDC IDC MSMT LEADCHNL RV SENSING INTR AMPL: 4.125 mV
MDC IDC SET LEADCHNL RA PACING AMPLITUDE: 1.5 V
MDC IDC STAT BRADY AP VS PERCENT: 55.63 %
MDC IDC STAT BRADY AS VP PERCENT: 24.25 %
MDC IDC STAT BRADY AS VS PERCENT: 7.52 %
MDC IDC STAT BRADY RV PERCENT PACED: 36.17 %

## 2018-07-08 ENCOUNTER — Encounter: Payer: Self-pay | Admitting: Cardiology

## 2018-07-08 NOTE — Progress Notes (Signed)
Remote ICD transmission.   

## 2018-07-25 ENCOUNTER — Telehealth: Payer: Self-pay | Admitting: Cardiovascular Disease

## 2018-07-25 NOTE — Telephone Encounter (Signed)
New Message:    Please call, he needs to talk to you about his blood thinner.

## 2018-07-25 NOTE — Telephone Encounter (Signed)
Called pt back advising him of PharmD advisement. Pt does NOT want to switch to Eliquis at this time but is thankful for the option which he may choose in the future. He is hoping the upcoming warmer weather will help.

## 2018-07-25 NOTE — Telephone Encounter (Signed)
Pt states that his Xarelto is making him "cold from the inside" and would like to switch to another blood thinner if possible. Pt states this is not a new problem. He is hoping to switch to a new drug that does not have this side effect.   Will route to PharmD for advisement.

## 2018-07-25 NOTE — Telephone Encounter (Signed)
His Xarelto should not be making him feel cold, this may be more related to his afib. If he would still like to change his blood thinner, he could try Eliquis 2.5mg  twice daily. He would take his first dose this evening when he would typically be due for his next dose of Xarelto.

## 2018-07-31 ENCOUNTER — Other Ambulatory Visit: Payer: Self-pay | Admitting: Cardiovascular Disease

## 2018-08-01 ENCOUNTER — Emergency Department (HOSPITAL_COMMUNITY)
Admission: EM | Admit: 2018-08-01 | Discharge: 2018-08-01 | Disposition: A | Discharge status: Home / Self Care | Payer: Medicare Other | Attending: Emergency Medicine | Admitting: Emergency Medicine

## 2018-08-01 ENCOUNTER — Other Ambulatory Visit: Payer: Self-pay

## 2018-08-01 ENCOUNTER — Emergency Department (HOSPITAL_COMMUNITY): Payer: Medicare Other

## 2018-08-01 ENCOUNTER — Ambulatory Visit: Payer: Self-pay

## 2018-08-01 ENCOUNTER — Encounter (HOSPITAL_COMMUNITY): Payer: Self-pay | Admitting: Emergency Medicine

## 2018-08-01 DIAGNOSIS — R531 Weakness: Secondary | ICD-10-CM | POA: Diagnosis present

## 2018-08-01 DIAGNOSIS — Z87891 Personal history of nicotine dependence: Secondary | ICD-10-CM | POA: Insufficient documentation

## 2018-08-01 DIAGNOSIS — I517 Cardiomegaly: Secondary | ICD-10-CM | POA: Diagnosis not present

## 2018-08-01 DIAGNOSIS — R5382 Chronic fatigue, unspecified: Secondary | ICD-10-CM | POA: Diagnosis not present

## 2018-08-01 DIAGNOSIS — N189 Chronic kidney disease, unspecified: Secondary | ICD-10-CM | POA: Insufficient documentation

## 2018-08-01 DIAGNOSIS — Z951 Presence of aortocoronary bypass graft: Secondary | ICD-10-CM | POA: Diagnosis not present

## 2018-08-01 DIAGNOSIS — R438 Other disturbances of smell and taste: Secondary | ICD-10-CM | POA: Insufficient documentation

## 2018-08-01 DIAGNOSIS — D649 Anemia, unspecified: Secondary | ICD-10-CM | POA: Insufficient documentation

## 2018-08-01 DIAGNOSIS — Z20828 Contact with and (suspected) exposure to other viral communicable diseases: Secondary | ICD-10-CM | POA: Insufficient documentation

## 2018-08-01 DIAGNOSIS — I5022 Chronic systolic (congestive) heart failure: Secondary | ICD-10-CM | POA: Diagnosis not present

## 2018-08-01 DIAGNOSIS — I130 Hypertensive heart and chronic kidney disease with heart failure and stage 1 through stage 4 chronic kidney disease, or unspecified chronic kidney disease: Secondary | ICD-10-CM | POA: Insufficient documentation

## 2018-08-01 DIAGNOSIS — R5383 Other fatigue: Secondary | ICD-10-CM | POA: Insufficient documentation

## 2018-08-01 DIAGNOSIS — Z7901 Long term (current) use of anticoagulants: Secondary | ICD-10-CM | POA: Insufficient documentation

## 2018-08-01 DIAGNOSIS — Z79899 Other long term (current) drug therapy: Secondary | ICD-10-CM | POA: Diagnosis not present

## 2018-08-01 DIAGNOSIS — J984 Other disorders of lung: Secondary | ICD-10-CM | POA: Diagnosis not present

## 2018-08-01 DIAGNOSIS — I129 Hypertensive chronic kidney disease with stage 1 through stage 4 chronic kidney disease, or unspecified chronic kidney disease: Secondary | ICD-10-CM | POA: Diagnosis not present

## 2018-08-01 LAB — CBC WITH DIFFERENTIAL/PLATELET
Abs Immature Granulocytes: 0.01 10*3/uL (ref 0.00–0.07)
Basophils Absolute: 0.1 10*3/uL (ref 0.0–0.1)
Basophils Relative: 1 %
Eosinophils Absolute: 0.2 10*3/uL (ref 0.0–0.5)
Eosinophils Relative: 3 %
HCT: 31.4 % — ABNORMAL LOW (ref 39.0–52.0)
Hemoglobin: 8.8 g/dL — ABNORMAL LOW (ref 13.0–17.0)
Immature Granulocytes: 0 %
Lymphocytes Relative: 21 %
Lymphs Abs: 1.4 10*3/uL (ref 0.7–4.0)
MCH: 19.9 pg — ABNORMAL LOW (ref 26.0–34.0)
MCHC: 28 g/dL — ABNORMAL LOW (ref 30.0–36.0)
MCV: 70.9 fL — ABNORMAL LOW (ref 80.0–100.0)
Monocytes Absolute: 0.8 10*3/uL (ref 0.1–1.0)
Monocytes Relative: 12 %
Neutro Abs: 4 10*3/uL (ref 1.7–7.7)
Neutrophils Relative %: 63 %
Platelets: 189 10*3/uL (ref 150–400)
RBC: 4.43 MIL/uL (ref 4.22–5.81)
RDW: 20 % — ABNORMAL HIGH (ref 11.5–15.5)
WBC: 6.3 10*3/uL (ref 4.0–10.5)
nRBC: 0 % (ref 0.0–0.2)

## 2018-08-01 LAB — BASIC METABOLIC PANEL
Anion gap: 8 (ref 5–15)
BUN: 35 mg/dL — ABNORMAL HIGH (ref 8–23)
CO2: 22 mmol/L (ref 22–32)
Calcium: 10.4 mg/dL — ABNORMAL HIGH (ref 8.9–10.3)
Chloride: 109 mmol/L (ref 98–111)
Creatinine, Ser: 1.71 mg/dL — ABNORMAL HIGH (ref 0.61–1.24)
GFR calc Af Amer: 42 mL/min — ABNORMAL LOW (ref >60)
GFR calc non Af Amer: 36 mL/min — ABNORMAL LOW (ref >60)
Glucose, Bld: 109 mg/dL — ABNORMAL HIGH (ref 70–99)
Potassium: 4.4 mmol/L (ref 3.5–5.1)
Sodium: 139 mmol/L (ref 135–145)

## 2018-08-01 LAB — TROPONIN I: Troponin I: <0.03 ng/mL (ref <0.03)

## 2018-08-01 NOTE — ED Provider Notes (Signed)
[facility] DEPT Provider Note   CSN: [id number] Arrival date & time: 08/01/18  1800    History   Chief Complaint Chief Complaint  Patient presents with  . Fatigue  . Weakness    HPI [name] is a 85 y.o. male.  He was sent in from his primary care doctor after telemetry visit for evaluation of lack of taste and generalized weakness.  Said the symptoms been going on over a week.  He said he is mildly short of breath increased with exertion but he relates this to his prior cardiac problems.  No known fever or cough.  No vomiting or diarrhea.  He continues to eat and drink despite his lack of taste.  No sick contacts or recent travel or known Covid exposure.     The history is provided by the patient.  Weakness  Severity:  Moderate Onset quality:  Gradual Timing:  Intermittent Progression:  Unchanged Chronicity:  New Context: decreased sleep   Relieved by:  Nothing Worsened by:  Nothing Ineffective treatments:  None tried Associated symptoms: shortness of breath   Associated symptoms: no abdominal pain, no chest pain, no cough, no diarrhea, no dysphagia, no dysuria, no fever, no foul-smelling urine, no headaches, no loss of consciousness, no nausea, no stroke symptoms, no syncope and no vomiting     Past Medical History:  Diagnosis Date  . Aortic stenosis, severe    s/p TAVR with Edwards Sapien 3 THV (size 26 mm, model # 9600TFX, serial # [id number])  . Arthritis   . Cardiac arrest ([place])   . Chronic systolic CHF (congestive heart failure) ([place]) 01/21/2018  . Claudication ([place])   . Coronary artery disease   . Essential hypertension   . Headache   . History of hiatal hernia   . Hyperlipidemia   . Kidney stones   . Non-ST elevation (NSTEMI) myocardial infarction ([place]) 01/21/2018  . OSA (obstructive sleep apnea) 02/05/2018   Severe obstructive sleep apnea with an AHI of 36.8/h with no significant central sleep apnea.   Now on CPAP at  5  cm H2O  . Peripheral vascular disease ([place])   . S/P cardiac cath 01/22/18  01/23/2018    Patient Active Problem List   Diagnosis Date Noted  . AKI (acute kidney injury) ([place]) 03/03/2018  . Constipation 03/03/2018  . Anemia 03/03/2018  . OSA (obstructive sleep apnea) 02/05/2018  . S/P cardiac cath 01/22/18 stable -treating medically  01/23/2018  . Atrial tachycardia ([place]) 01/22/2018  . Non-ST elevation (NSTEMI) myocardial infarction ([place]) 01/21/2018  . Chronic systolic CHF (congestive heart failure) ([place]) 01/21/2018  . Atrial flutter with rapid ventricular response ([place]) 01/20/2018  . Sleep difficulties 07/29/2015  . Erectile disorder due to medical condition in male patient 12/17/2014  . Severe aortic valve stenosis - s/p  Edwards Sapien 3 THV (size 26 mm, model # 9600TFX, serial # [id number]) 03/23/2014 03/23/2014  . Abnormal nuclear stress test 01/29/2014  . Ischemic cardiomyopathy 21-Dec-2011  . 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 Back pain 07/10/2010  . Hypercholesteremia 07/10/2010  . Benign hypertensive heart disease without heart failure 07/10/2010  . S/P CABG (coronary artery bypass graft) 1982 and re-do 1994  07/10/2010  . Implantable cardioverter-defibrillator (ICD) in situ 03/30/2010  . Atrial fibrillation ([place]) September 29, 202011    Past Surgical History:  Procedure Laterality Date  . ADENOIDECTOMY    . APPENDECTOMY    . CARDIAC CATHETERIZATION   11/16/2009   . CARDIAC DEFIBRILLATOR PLACEMENT    .  CARDIOVERSION N/A 02/24/2018   Procedure: CARDIOVERSION;  Surgeon: Sueanne Margarita, MD;  Location: [facility] ENDOSCOPY;  Service: Cardiovascular;  Laterality: N/A;  . cataract surgery      Bilateral cataract surgery  . CORONARY ARTERY BYPASS GRAFT     in 1982 and '84 with subsequent PTCI  . EP IMPLANTABLE DEVICE N/A 03/21/2016   Procedure: ICD Generator Changeout;  Surgeon: Deboraha Sprang, MD;  Location: [facility] CV LAB;  Service: Cardiovascular;  Laterality: N/A;  . HERNIA  REPAIR    . LEFT AND RIGHT HEART CATHETERIZATION WITH CORONARY/GRAFT ANGIOGRAM N/A 02/01/2014   Procedure: LEFT AND RIGHT HEART CATHETERIZATION WITH Beatrix Fetters;  Surgeon: Sinclair Grooms, MD;  Location: [facility] CATH LAB;  Service: Cardiovascular;  Laterality: N/A;  . left elbow tendon surgery    . LEFT HEART CATH AND CORS/GRAFTS ANGIOGRAPHY N/A 01/22/2018   Procedure: LEFT HEART CATH AND CORS/GRAFTS ANGIOGRAPHY;  Surgeon: Leonie Man, MD;  Location: [facility] CV LAB;  Service: Cardiovascular;  Laterality: N/A;  . TEE WITHOUT CARDIOVERSION N/A 03/23/2014   Procedure: TRANSESOPHAGEAL ECHOCARDIOGRAM (TEE);  Surgeon: Burnell Blanks, MD;  Location: [facility];  Service: Open Heart Surgery;  Laterality: N/A;  . TONSILLECTOMY    . TRANSCATHETER AORTIC VALVE REPLACEMENT, TRANSFEMORAL N/A 03/23/2014   Procedure: TRANSCATHETER AORTIC VALVE REPLACEMENT, TRANSFEMORAL;  Surgeon: Burnell Blanks, MD;  Location: [facility];  Service: Open Heart Surgery;  Laterality: N/A;        Home Medications    Prior to Admission medications   Medication Sig Start Date End Date Taking? Authorizing Provider  amiodarone (PACERONE) 200 MG tablet Take 1 tablet (200 mg total) by mouth daily. 02/22/18   Lyda Jester M, PA-C  amLODipine (NORVASC) 5 MG tablet TAKE 1 TABLET BY MOUTH DAILY. Patient taking differently: Take 5 mg by mouth daily.  12/23/17   Burnell Blanks, MD  amoxicillin (AMOXIL) 500 MG tablet Take 4 tablets by mouth 60 minutes prior to dental appointments 12/11/17   Burnell Blanks, MD  Calcium Carbonate Antacid (TUMS PO) Take 2 tablets by mouth 2 (two) times daily as needed (indigestion/ acid reflux).    [provider]  Coenzyme Q10-Vitamin E (QUNOL ULTRA COQ10 PO) Take 15 mLs by mouth at bedtime.     [provider]  diazepam (VALIUM) 5 MG tablet TAKE 1 TABLET BY MOUTH ONCE DAILY AS NEEDED 05/19/18   Burnell Blanks, MD  furosemide (LASIX)  40 MG tablet TAKE 1 TABLET (40 MG TOTAL) BY MOUTH DAILY. 07/31/18   Burnell Blanks, MD  ketotifen (ZADITOR) 0.025 % ophthalmic solution Place 1 drop into both eyes daily as needed (seasonal allergies).    [provider]  KRILL OIL PO Take 1 capsule by mouth at bedtime.     [provider]  latanoprost (XALATAN) 0.005 % ophthalmic solution Place 1 drop into both eyes at bedtime.  05/28/11   [provider]  Magnesium Oxide (MAG-OXIDE PO) Take 1 tablet by mouth at bedtime.     [provider]  Melatonin 1 MG TABS Take 2-3 mg by mouth at bedtime as needed (sleep).    [provider]  metoprolol tartrate (LOPRESSOR) 50 MG tablet Take 0.5 tablets (25 mg total) by mouth at bedtime. 02/24/18   Sueanne Margarita, MD  Multiple Vitamin (MULTIVITAMIN WITH MINERALS) TABS tablet Take 1 tablet by mouth daily.    [provider]  nitroGLYCERIN (NITROSTAT) 0.4 MG SL tablet Place 1 tablet (  0.4 mg total) under the tongue every 5 (five) minutes as needed for chest pain (for chest pain (MAX of 3 doses)). 04/12/15   Burnell Blanks, MD  Potassium 99 MG TABS Take 99 mg by mouth 2 (two) times daily.    [provider]  Propylene Glycol (SYSTANE COMPLETE) 0.6 % SOLN Place 1 drop into both eyes daily as needed (dry eyes).     [provider]  Rivaroxaban (XARELTO) 15 MG TABS tablet Take 1 tablet (15 mg total) by mouth daily with supper. 02/13/18   Burnell Blanks, MD  rosuvastatin (CRESTOR) 40 MG tablet TAKE 1 TABLET BY MOUTH DAILY. Patient taking differently: Take 40 mg by mouth every evening.  12/23/17   Burnell Blanks, MD  Thiamine HCl (VITAMIN B-1) 100 MG tablet Take 100 mg by mouth at bedtime.     [provider]  trospium (SANCTURA) 20 MG tablet Take 20 mg by mouth 2 (two) times daily.    [provider]    Family History Family History  Problem Relation Age of Onset  . Heart attack Father 80  .  Coronary artery disease Other        family history is dignificant for early CAD in several members    Social History Social History   Tobacco Use  . Smoking status: Former Smoker    Last attempt to quit: 10/25/1952    Years since quitting: 65.8  . Smokeless tobacco: Never Used  Substance Use Topics  . Alcohol use: Yes    Alcohol/week: 5.0 - 6.0 standard drinks    Types: 5 - 6 Glasses of wine per week    Comment: 5-6 times a week  . Drug use: No     Allergies   Cyclobenzaprine hcl and Tetracycline   Review of Systems Review of Systems  Constitutional: Negative for fever.  HENT: Negative for sore throat.   Eyes: Negative for visual disturbance.  Respiratory: Positive for shortness of breath. Negative for cough.   Cardiovascular: Negative for chest pain and syncope.  Gastrointestinal: Negative for abdominal pain, diarrhea, dysphagia, nausea and vomiting.  Genitourinary: Negative for dysuria.  Musculoskeletal: Negative for neck pain.  Skin: Negative for rash.  Neurological: Positive for weakness. Negative for loss of consciousness and headaches.     Physical Exam Updated Vital Signs BP 109/76 (BP Location: Left Arm)   Pulse 84   Temp 97.9 F (36.6 C) (Oral)   Resp 18   SpO2 96%   Physical Exam Vitals signs and nursing note reviewed.  Constitutional:      Appearance: He is well-developed. He is not ill-appearing or toxic-appearing.  HENT:     Head: Normocephalic and atraumatic.  Eyes:     Conjunctiva/sclera: Conjunctivae normal.  Neck:     Musculoskeletal: Neck supple.  Cardiovascular:     Rate and Rhythm: Normal rate and regular rhythm.     Heart sounds: No murmur.  Pulmonary:     Effort: Pulmonary effort is normal. No respiratory distress.     Breath sounds: Normal breath sounds.  Abdominal:     Palpations: Abdomen is soft.     Tenderness: There is no abdominal tenderness.  Musculoskeletal: Normal range of motion.        General: No tenderness or  signs of injury.     Right lower leg: No edema.     Left lower leg: No edema.  Skin:    General: Skin is warm and dry.  Capillary Refill: Capillary refill takes less than 2 seconds.  Neurological:     General: No focal deficit present.     Mental Status: He is alert and oriented to person, place, and time.     Sensory: No sensory deficit.     Motor: No weakness.      ED Treatments / Results  Labs (all labs ordered are listed, but only abnormal results are displayed) Labs Reviewed  BASIC METABOLIC PANEL - Abnormal; Notable for the following components:      Result Value   Glucose, Bld 109 (*)    BUN 35 (*)    Creatinine, Ser 1.71 (*)    Calcium 10.4 (*)    GFR calc non Af Amer 36 (*)    GFR calc Af Amer 42 (*)    All other components within normal limits  CBC WITH DIFFERENTIAL/PLATELET - Abnormal; Notable for the following components:   Hemoglobin 8.8 (*)    HCT 31.4 (*)    MCV 70.9 (*)    MCH 19.9 (*)    MCHC 28.0 (*)    RDW 20.0 (*)    All other components within normal limits  NOVEL CORONAVIRUS, NAA (HOSPITAL ORDER, SEND-OUT TO REF LAB)  TROPONIN I    EKG EKG Interpretation  Date/Time:  Friday August 01 2018 19:10:32 EDT Ventricular Rate:  65 PR Interval:    QRS Duration: 179 QT Interval:  499 QTC Calculation: 519 R Axis:   -77 Text Interpretation:  A-V dual-paced rhythm with some inhibition No further analysis attempted due to paced rhythm Baseline wander in lead(s) V2 slower rate than prior 10/19 Confirmed by Aletta Edouard [id number]) on 08/01/2018 7:22:24 PM   Radiology Dg Chest Port 1 View  Result Date: 08/01/2018 CLINICAL DATA:  Weakness EXAM: PORTABLE CHEST 1 VIEW COMPARISON:  01/20/2018 FINDINGS: Cardiomegaly. Post sternotomy changes. Left-sided pacing device and valve prosthesis. Scarring at the left base. No focal airspace disease or effusion. No pneumothorax. IMPRESSION: Cardiomegaly.  Mild scarring at the left base. Electronically Signed   By: Donavan Foil M.D.   On: 08/01/2018 19:05    Procedures Procedures (including critical care time)  Medications Ordered in ED Medications - No data to display   Initial Impression / Assessment and Plan / ED Course  I have reviewed the triage vital signs and the nursing notes.  Pertinent labs & imaging results that were available during my care of the patient were reviewed by me and considered in my medical decision making (see chart for details).  Clinical Course as of Jul 31 2317  Fri Aug 01, 2018  2005 Differential diagnosis includes Covid, viral syndrome, anemia, ACS, pneumonia.  His Covid test is pending.  His creatinine is elevated but at baseline.  He is slightly more anemic than his baseline.  White count normal troponin normal.  Chest x-ray showing no acute infiltrate.  Will discharge home with close follow-up with his PCP to follow his Covid testing.  Currently does not need admission.   [MB]    Clinical Course User Index [MB] Hayden Rasmussen, MD         Final Clinical Impressions(s) / ED Diagnoses   Final diagnoses:  Hypogeusia  Fatigue, unspecified type  Anemia, unspecified type  Chronic kidney disease, unspecified CKD stage    ED Discharge Orders    None       Hayden Rasmussen, MD 08/01/18 2321

## 2018-08-01 NOTE — ED Notes (Signed)
ED provider at bedside.

## 2018-08-01 NOTE — Discharge Instructions (Signed)
You were seen in the emergency department at the recommendation of your primary care doctor for evaluation of decreased sense of taste along with some fatigue.  Your blood work showed that your a little more anemic than your baseline.  The rest of your test were all baseline.  We sent off a Covid test that will probably result within the next day or 2.  Please follow-up this up with your doctor and on [place].  Return if any worsening symptoms.

## 2018-08-01 NOTE — Telephone Encounter (Signed)
Talked with patient's daughter----this could possibly be covid19 virus, esp with loss of taste/smell/difficulty breathing---even if it's not virus related---patient needs to be seen at [facility] ED regarding existing comorbidities and evaluated---advised to go to [place] now---daughter will take him to ED now

## 2018-08-01 NOTE — ED Triage Notes (Signed)
Patient complains of loss of taste and fatigue. He says, "the only thing that tastes good is an orange."

## 2018-08-01 NOTE — ED Notes (Signed)
Bed: EN40 Expected date:  Expected time:  Means of arrival:  Comments: Hold for [facility]

## 2018-08-01 NOTE — ED Notes (Signed)
X-ray at bedside

## 2018-08-01 NOTE — Telephone Encounter (Signed)
Pt c/o 1 week of fatigue, loss of taste, SOB, chills. Pt stated that he was concerned when he loss his sense of smell. Wife stated that it looked like he was breathing a little harder than usual. Pt high risk. Pt with signficant cardiac hx: CABG x 2, cardiac arrest, and has an internal defibrillator. Pt stated that he is worse than he was on Monday.  Care advice given and pt verbalized understanding. Routing to office per request.  Reason for Disposition . HIGH RISK patient (e.g., age > 81 years, diabetes, heart or lung disease, weak immune system) . MILD difficulty breathing (e.g., minimal/no SOB at rest, SOB with walking, pulse <100)  Answer Assessment - Initial Assessment Questions 1. COVID-19 DIAGNOSIS: "Who made your Coronavirus (COVID-19) diagnosis?" "Was it confirmed by a positive lab test?" If not diagnosed by a HCP, ask "Are there lots of cases (community spread) where you live?" (See public health department website, if unsure)   * MAJOR community spread: high number of cases; numbers of cases are increasing; many people hospitalized.   * MINOR community spread: low number of cases; not increasing; few or no people hospitalized     major 2. ONSET: "When did the COVID-19 symptoms start?"      Monday 3. WORST SYMPTOM: "What is your worst symptom?" (e.g., cough, fever, shortness of breath, muscle aches)     Chills, loss of taste, fatigue, breathing harder and faster, hoarse 4. COUGH: "How bad is the cough?"       no 5. FEVER: "Do you have a fever?" If so, ask: "What is your temperature, how was it measured, and when did it start?"    Checked temp during call- 97.1 6. RESPIRATORY STATUS: "Describe your breathing?" (e.g., shortness of breath, wheezing, unable to speak)      SOB 7. BETTER-SAME-WORSE: "Are you getting better, staying the same or getting worse compared to yesterday?"  If getting worse, ask, "In what way?"     worse 8. HIGH RISK DISEASE: "Do you have any chronic medical  problems?" (e.g., asthma, heart or lung disease, weak immune system, etc.)    HTN, defibrillator, 2 bypass, cardiac arrest 2011 9. PREGNANCY: "Is there any chance you are pregnant?" "When was your last menstrual period?"     N/a 10. OTHER SYMPTOMS: "Do you have any other symptoms?"  (e.g., runny nose, headache, sore throat, loss of smell)       Loss of taste, fatigue,  Protocols used: CORONAVIRUS (COVID-19) DIAGNOSED OR SUSPECTED-A-AH

## 2018-08-02 LAB — SARS CORONAVIRUS 2 BY RT PCR (HOSPITAL ORDER, PERFORMED IN ~~LOC~~ HOSPITAL LAB): SARS Coronavirus 2: NEGATIVE

## 2018-08-07 NOTE — Progress Notes (Signed)
Virtual Visit via Video Note  I connected with DOM HAVERLAND on 08/08/18 at  2:00 PM EDT by a video enabled telemedicine application and verified that I am speaking with the correct person using two identifiers.   I discussed the limitations of evaluation and management by telemedicine and the availability of in person appointments. The patient expressed understanding and agreed to proceed.  The patient is currently at home and I am in the office.    No referring provider.    History of Present Illness: This visit is for follow up of anemia/ ER follow up.   ED:  He was advised to go to the ED on 4/24 after calling reporting 1 week of fatigue, loss of taste, SOB and chills.  In the ED he stated his SOB was related to his cardiac problems.  He denied fever, vomiting, diarrhea.  He is drinking and eating normally.  He denied sick contacts.  CXR was normal.  COVID test was negative.  Anemia was worse.  Kidney function was stable.   He is not sleeping well.  He has RLS.  He sleeps a lot during the day. He is taking an iron pill daily.    OSA:  He wears his cpap sometimes - he takes it off after 4hrs.  Occasionally he will wear it for 7 hrs.    His taste has a metallic taste.    Anemia, iron def, CKD:   He had a negative fecal occult on 03/28/18.  His iron levels were low and he is taking an iron pill daily.  He will see [facility] next week for further evaluation/treatment of his CKD.    CAD, HFrEF, Afib, htn:  He denies chest pain, but has tightness in his throat at times.  He took the garbage cans down recently and had tightness in his throat, which is his anginal equivalent and took NTG.   He never has chest pain.  He has some SOB with exertion, but feels this is at his baseline.    Prediabetes:  He is compliant with a low sugar/carbohydrate diet.  He is not exercising.   Review of Systems  Constitutional: Positive for malaise/fatigue. Negative for chills and fever.  HENT: Negative for  congestion and sore throat.   Respiratory: Positive for cough (occ) and shortness of breath (with exertion - at baseline). Negative for wheezing.   Cardiovascular: Negative for chest pain (throat), palpitations and leg swelling.  Neurological: Negative for headaches.       Some lightheadedness  Endo/Heme/Allergies:       Cold      Social History   Socioeconomic History  . Marital status: Married    Spouse name: Not on file  . Number of children: 3  . Years of education: Not on file  . Highest education level: Not on file  Occupational History  . Occupation: Retired  Scientific laboratory technician  . Financial resource strain: Not hard at all  . Food insecurity:    Worry: Never true    Inability: Never true  . Transportation needs:    Medical: No    Non-medical: No  Tobacco Use  . Smoking status: Former Smoker    Last attempt to quit: 10/25/1952    Years since quitting: 65.8  . Smokeless tobacco: Never Used  Substance and Sexual Activity  . Alcohol use: Yes    Alcohol/week: 5.0 - 6.0 standard drinks    Types: 5 - 6 Glasses of wine per week  Comment: 5-6 times a week  . Drug use: No  . Sexual activity: Never  Lifestyle  . Physical activity:    Days per week: Not on file    Minutes per session: Not on file  . Stress: Only a little  Relationships  . Social connections:    Talks on phone: More than three times a week    Gets together: More than three times a week    Attends religious service: More than 4 times per year    Active member of club or organization: Not on file    Attends meetings of clubs or organizations: More than 4 times per year    Relationship status: Married  Other Topics Concern  . Not on file  Social History Narrative  . Not on file     Observations/Objective: Appears well in NAD Breathing normally w/o SOB Normal mood and affect  Assessment and Plan:  See Problem List for Assessment and Plan of chronic medical problems.   Follow Up Instructions:     I discussed the assessment and treatment plan with the patient. The patient was provided an opportunity to ask questions and all were answered. The patient agreed with the plan and demonstrated an understanding of the instructions.   The patient was advised to call back or seek an in-person evaluation if the symptoms worsen or if the condition fails to improve as anticipated.    Binnie Rail, MD

## 2018-08-08 ENCOUNTER — Encounter: Payer: Self-pay | Admitting: Internal Medicine

## 2018-08-08 ENCOUNTER — Ambulatory Visit (INDEPENDENT_AMBULATORY_CARE_PROVIDER_SITE_OTHER): Payer: Medicare Other | Admitting: Internal Medicine

## 2018-08-08 DIAGNOSIS — I255 Ischemic cardiomyopathy: Secondary | ICD-10-CM | POA: Diagnosis not present

## 2018-08-08 DIAGNOSIS — N183 Chronic kidney disease, stage 3 unspecified: Secondary | ICD-10-CM

## 2018-08-08 DIAGNOSIS — I5022 Chronic systolic (congestive) heart failure: Secondary | ICD-10-CM

## 2018-08-08 DIAGNOSIS — D649 Anemia, unspecified: Secondary | ICD-10-CM

## 2018-08-08 DIAGNOSIS — G4733 Obstructive sleep apnea (adult) (pediatric): Secondary | ICD-10-CM | POA: Diagnosis not present

## 2018-08-08 DIAGNOSIS — G479 Sleep disorder, unspecified: Secondary | ICD-10-CM | POA: Diagnosis not present

## 2018-08-08 DIAGNOSIS — I10 Essential (primary) hypertension: Secondary | ICD-10-CM

## 2018-08-09 ENCOUNTER — Observation Stay (HOSPITAL_BASED_OUTPATIENT_CLINIC_OR_DEPARTMENT_OTHER): Payer: Medicare Other

## 2018-08-09 ENCOUNTER — Encounter (HOSPITAL_COMMUNITY): Payer: Self-pay | Admitting: Emergency Medicine

## 2018-08-09 ENCOUNTER — Emergency Department (HOSPITAL_COMMUNITY): Payer: Medicare Other

## 2018-08-09 ENCOUNTER — Other Ambulatory Visit: Payer: Self-pay

## 2018-08-09 ENCOUNTER — Inpatient Hospital Stay (HOSPITAL_COMMUNITY)
Admission: EM | Admit: 2018-08-09 | Discharge: 2018-08-11 | DRG: 291 | Disposition: A | Discharge status: Home Health | Payer: Medicare Other | Attending: Internal Medicine | Admitting: Internal Medicine

## 2018-08-09 DIAGNOSIS — D649 Anemia, unspecified: Secondary | ICD-10-CM | POA: Diagnosis present

## 2018-08-09 DIAGNOSIS — I2510 Atherosclerotic heart disease of native coronary artery without angina pectoris: Secondary | ICD-10-CM | POA: Diagnosis present

## 2018-08-09 DIAGNOSIS — I130 Hypertensive heart and chronic kidney disease with heart failure and stage 1 through stage 4 chronic kidney disease, or unspecified chronic kidney disease: Secondary | ICD-10-CM | POA: Diagnosis not present

## 2018-08-09 DIAGNOSIS — I4819 Other persistent atrial fibrillation: Secondary | ICD-10-CM | POA: Diagnosis not present

## 2018-08-09 DIAGNOSIS — N179 Acute kidney failure, unspecified: Secondary | ICD-10-CM | POA: Diagnosis present

## 2018-08-09 DIAGNOSIS — I361 Nonrheumatic tricuspid (valve) insufficiency: Secondary | ICD-10-CM | POA: Diagnosis not present

## 2018-08-09 DIAGNOSIS — I5033 Acute on chronic diastolic (congestive) heart failure: Secondary | ICD-10-CM | POA: Diagnosis present

## 2018-08-09 DIAGNOSIS — Z951 Presence of aortocoronary bypass graft: Secondary | ICD-10-CM

## 2018-08-09 DIAGNOSIS — Z20828 Contact with and (suspected) exposure to other viral communicable diseases: Secondary | ICD-10-CM | POA: Diagnosis present

## 2018-08-09 DIAGNOSIS — Z79899 Other long term (current) drug therapy: Secondary | ICD-10-CM

## 2018-08-09 DIAGNOSIS — Z87891 Personal history of nicotine dependence: Secondary | ICD-10-CM

## 2018-08-09 DIAGNOSIS — Z03818 Encounter for observation for suspected exposure to other biological agents ruled out: Secondary | ICD-10-CM | POA: Diagnosis not present

## 2018-08-09 DIAGNOSIS — I340 Nonrheumatic mitral (valve) insufficiency: Secondary | ICD-10-CM

## 2018-08-09 DIAGNOSIS — R0689 Other abnormalities of breathing: Secondary | ICD-10-CM | POA: Diagnosis not present

## 2018-08-09 DIAGNOSIS — I5043 Acute on chronic combined systolic (congestive) and diastolic (congestive) heart failure: Secondary | ICD-10-CM | POA: Diagnosis present

## 2018-08-09 DIAGNOSIS — I252 Old myocardial infarction: Secondary | ICD-10-CM

## 2018-08-09 DIAGNOSIS — J9601 Acute respiratory failure with hypoxia: Secondary | ICD-10-CM | POA: Diagnosis present

## 2018-08-09 DIAGNOSIS — I509 Heart failure, unspecified: Secondary | ICD-10-CM

## 2018-08-09 DIAGNOSIS — I4891 Unspecified atrial fibrillation: Secondary | ICD-10-CM | POA: Diagnosis present

## 2018-08-09 DIAGNOSIS — I255 Ischemic cardiomyopathy: Secondary | ICD-10-CM | POA: Diagnosis present

## 2018-08-09 DIAGNOSIS — R062 Wheezing: Secondary | ICD-10-CM | POA: Diagnosis not present

## 2018-08-09 DIAGNOSIS — I5023 Acute on chronic systolic (congestive) heart failure: Secondary | ICD-10-CM

## 2018-08-09 DIAGNOSIS — I10 Essential (primary) hypertension: Secondary | ICD-10-CM | POA: Diagnosis present

## 2018-08-09 DIAGNOSIS — R0602 Shortness of breath: Secondary | ICD-10-CM | POA: Diagnosis not present

## 2018-08-09 DIAGNOSIS — E7800 Pure hypercholesterolemia, unspecified: Secondary | ICD-10-CM | POA: Diagnosis present

## 2018-08-09 DIAGNOSIS — E785 Hyperlipidemia, unspecified: Secondary | ICD-10-CM | POA: Diagnosis present

## 2018-08-09 DIAGNOSIS — R0902 Hypoxemia: Secondary | ICD-10-CM | POA: Diagnosis not present

## 2018-08-09 DIAGNOSIS — Z8249 Family history of ischemic heart disease and other diseases of the circulatory system: Secondary | ICD-10-CM

## 2018-08-09 DIAGNOSIS — G4733 Obstructive sleep apnea (adult) (pediatric): Secondary | ICD-10-CM | POA: Diagnosis present

## 2018-08-09 DIAGNOSIS — R001 Bradycardia, unspecified: Secondary | ICD-10-CM | POA: Diagnosis not present

## 2018-08-09 DIAGNOSIS — Z95810 Presence of automatic (implantable) cardiac defibrillator: Secondary | ICD-10-CM

## 2018-08-09 DIAGNOSIS — N183 Chronic kidney disease, stage 3 unspecified: Secondary | ICD-10-CM | POA: Diagnosis present

## 2018-08-09 DIAGNOSIS — Z7901 Long term (current) use of anticoagulants: Secondary | ICD-10-CM

## 2018-08-09 DIAGNOSIS — Z952 Presence of prosthetic heart valve: Secondary | ICD-10-CM

## 2018-08-09 DIAGNOSIS — I739 Peripheral vascular disease, unspecified: Secondary | ICD-10-CM | POA: Diagnosis present

## 2018-08-09 LAB — CBC WITH DIFFERENTIAL/PLATELET
Abs Immature Granulocytes: 0.03 10*3/uL (ref 0.00–0.07)
Basophils Absolute: 0.1 10*3/uL (ref 0.0–0.1)
Basophils Relative: 1 %
Eosinophils Absolute: 0.1 10*3/uL (ref 0.0–0.5)
Eosinophils Relative: 1 %
HCT: 31.5 % — ABNORMAL LOW (ref 39.0–52.0)
Hemoglobin: 9.2 g/dL — ABNORMAL LOW (ref 13.0–17.0)
Immature Granulocytes: 0 %
Lymphocytes Relative: 15 %
Lymphs Abs: 1.5 10*3/uL (ref 0.7–4.0)
MCH: 21.1 pg — ABNORMAL LOW (ref 26.0–34.0)
MCHC: 29.2 g/dL — ABNORMAL LOW (ref 30.0–36.0)
MCV: 72.2 fL — ABNORMAL LOW (ref 80.0–100.0)
Monocytes Absolute: 0.9 10*3/uL (ref 0.1–1.0)
Monocytes Relative: 10 %
Neutro Abs: 7.2 10*3/uL (ref 1.7–7.7)
Neutrophils Relative %: 73 %
Platelets: 210 10*3/uL (ref 150–400)
RBC: 4.36 MIL/uL (ref 4.22–5.81)
RDW: 23.9 % — ABNORMAL HIGH (ref 11.5–15.5)
WBC: 9.8 10*3/uL (ref 4.0–10.5)
nRBC: 0.2 % (ref 0.0–0.2)

## 2018-08-09 LAB — COMPREHENSIVE METABOLIC PANEL
ALT: 23 U/L (ref 0–44)
AST: 22 U/L (ref 15–41)
Albumin: 4 g/dL (ref 3.5–5.0)
Alkaline Phosphatase: 53 U/L (ref 38–126)
Anion gap: 10 (ref 5–15)
BUN: 31 mg/dL — ABNORMAL HIGH (ref 8–23)
CO2: 18 mmol/L — ABNORMAL LOW (ref 22–32)
Calcium: 10.1 mg/dL (ref 8.9–10.3)
Chloride: 108 mmol/L (ref 98–111)
Creatinine, Ser: 1.69 mg/dL — ABNORMAL HIGH (ref 0.61–1.24)
GFR calc Af Amer: 43 mL/min — ABNORMAL LOW (ref >60)
GFR calc non Af Amer: 37 mL/min — ABNORMAL LOW (ref >60)
Glucose, Bld: 125 mg/dL — ABNORMAL HIGH (ref 70–99)
Potassium: 4.8 mmol/L (ref 3.5–5.1)
Sodium: 136 mmol/L (ref 135–145)
Total Bilirubin: 0.9 mg/dL (ref 0.3–1.2)
Total Protein: 7 g/dL (ref 6.5–8.1)

## 2018-08-09 LAB — ECHOCARDIOGRAM COMPLETE
Height: 66.25 in
Weight: 2539.7 oz

## 2018-08-09 LAB — BRAIN NATRIURETIC PEPTIDE: B Natriuretic Peptide: 771 pg/mL — ABNORMAL HIGH (ref 0.0–100.0)

## 2018-08-09 LAB — TROPONIN I: Troponin I: <0.03 ng/mL (ref <0.03)

## 2018-08-09 LAB — SARS CORONAVIRUS 2 BY RT PCR (HOSPITAL ORDER, PERFORMED IN ~~LOC~~ HOSPITAL LAB): SARS Coronavirus 2: NEGATIVE

## 2018-08-09 MED ORDER — DIAZEPAM 5 MG PO TABS: 5.0000 mg | ORAL_TABLET | Freq: Every evening | ORAL | Status: DC | PRN

## 2018-08-09 MED ORDER — ASPIRIN EC 81 MG PO TBEC
81.0000 mg | DELAYED_RELEASE_TABLET | Freq: Every day | ORAL | Status: DC
  Administered 2018-08-09 – 2018-08-11 (×3): 81 mg via ORAL
  Filled 2018-08-09 (×3): qty 1

## 2018-08-09 MED ORDER — RIVAROXABAN 15 MG PO TABS
15.0000 mg | ORAL_TABLET | Freq: Every day | ORAL | Status: DC
  Administered 2018-08-09 – 2018-08-10 (×2): 15 mg via ORAL
  Filled 2018-08-09 (×2): qty 1

## 2018-08-09 MED ORDER — FUROSEMIDE 10 MG/ML IJ SOLN
40.0000 mg | Freq: Two times a day (BID) | INTRAMUSCULAR | Status: DC
  Administered 2018-08-09 – 2018-08-11 (×4): 40 mg via INTRAVENOUS
  Filled 2018-08-09 (×4): qty 4

## 2018-08-09 MED ORDER — DARIFENACIN HYDROBROMIDE ER 7.5 MG PO TB24
7.5000 mg | ORAL_TABLET | Freq: Every day | ORAL | Status: DC
  Administered 2018-08-09 – 2018-08-11 (×3): 7.5 mg via ORAL
  Filled 2018-08-09 (×3): qty 1

## 2018-08-09 MED ORDER — AMIODARONE HCL 200 MG PO TABS
200.0000 mg | ORAL_TABLET | Freq: Every day | ORAL | Status: DC
  Administered 2018-08-09 – 2018-08-11 (×3): 200 mg via ORAL
  Filled 2018-08-09 (×3): qty 1

## 2018-08-09 MED ORDER — SODIUM CHLORIDE 0.9% FLUSH: 3.0000 mL | INTRAVENOUS | Status: DC | PRN

## 2018-08-09 MED ORDER — ROSUVASTATIN CALCIUM 20 MG PO TABS
40.0000 mg | ORAL_TABLET | Freq: Every evening | ORAL | Status: DC
  Administered 2018-08-09 – 2018-08-10 (×2): 40 mg via ORAL
  Filled 2018-08-09 (×2): qty 2

## 2018-08-09 MED ORDER — ONDANSETRON HCL 4 MG/2ML IJ SOLN: 4.0000 mg | Freq: Four times a day (QID) | INTRAMUSCULAR | Status: DC | PRN

## 2018-08-09 MED ORDER — SODIUM CHLORIDE 0.9 % IV SOLN: 250.0000 mL | INTRAVENOUS | Status: DC | PRN

## 2018-08-09 MED ORDER — ACETAMINOPHEN 325 MG PO TABS: 650.0000 mg | ORAL_TABLET | ORAL | Status: DC | PRN

## 2018-08-09 MED ORDER — SODIUM CHLORIDE 0.9% FLUSH
3.0000 mL | Freq: Two times a day (BID) | INTRAVENOUS | Status: DC
  Administered 2018-08-09 – 2018-08-11 (×5): 3 mL via INTRAVENOUS

## 2018-08-09 MED ORDER — FUROSEMIDE 10 MG/ML IJ SOLN
80.0000 mg | Freq: Once | INTRAMUSCULAR | Status: AC
  Administered 2018-08-09: 06:00:00 80 mg via INTRAVENOUS
  Filled 2018-08-09: qty 8

## 2018-08-09 MED ORDER — CARVEDILOL 6.25 MG PO TABS
6.2500 mg | ORAL_TABLET | Freq: Two times a day (BID) | ORAL | Status: DC
  Administered 2018-08-09 – 2018-08-11 (×4): 6.25 mg via ORAL
  Filled 2018-08-09 (×4): qty 1

## 2018-08-09 NOTE — ED Notes (Signed)
Nurse drawing labs. 

## 2018-08-09 NOTE — Evaluation (Signed)
Physical Therapy Evaluation Patient Details Name: [name] MRN: [id number] DOB: [birth year] Today's Date: 08/09/2018   History of Present Illness  85 y.o. male with medical history significant of PVD; OSA; CAD; HLD; HTN; chronic systolic CHF with AICD; afib on Xarelto; and AS s/p TAVR presenting with SOB.  He has had SOB and suboptimal kidney function.  He has been feeling poorly for the last week, extremely weak.  SOB is with exertion. CHF exacerbation - on 4L O2 currently with sat 92%.  Given Lasix, looks overloaded and CXR looks wet.   Clinical Impression  PTA pt living with wife in two story home with master on first floor and 4 steps with rails to enter. Pt independent in community level mobility and iADLs. Pt currently limited by new oxygen dependence, and related decreased strength, balance and endurance. Pt is mod I for bed mobility, supervision for transfers and hands on min guard for ambulation and stairs. PT does not anticipate need for HHPT at d/c. PT will continue to see acutely to work on higher level balance and endurance.     Follow Up Recommendations No PT follow up;Supervision/Assistance - 24 hour    Equipment Recommendations  None recommended by PT    Recommendations for Other Services       Precautions / Restrictions Precautions Precautions: None Restrictions Weight Bearing Restrictions: No      Mobility  Bed Mobility Overal bed mobility: Modified Independent             General bed mobility comments: use of bed rails to pull to EoB  Transfers Overall transfer level: Needs assistance Equipment used: None Transfers: Sit to/from Stand Sit to Stand: Supervision         General transfer comment: supervision for safety, mild instability on standing but able to self correct  Ambulation/Gait Ambulation/Gait assistance: Min guard Gait Distance (Feet): 250 Feet Assistive device: None Gait Pattern/deviations: Step-through pattern;Decreased step length  - right;Decreased step length - left;Drifts right/left;Shuffle Gait velocity: slowed Gait velocity interpretation: 1.31 - 2.62 ft/sec, indicative of limited community ambulator General Gait Details: hands on min guard progressing to min guard and pt has mild instability drifting in hallway intially. As pt progresses gait is more stable   Stairs Stairs: Yes Stairs assistance: Min guard Stair Management: One rail Left;Forwards;Alternating pattern;Step to pattern Number of Stairs: 9 General stair comments: hands on min guard for ascent of 9 steps, step over step and descent of steps step to step        Balance Overall balance assessment: Mild deficits observed, not formally tested                                           Pertinent Vitals/Pain Pain Assessment: No/denies pain    Home Living Family/patient expects to be discharged to:: Private residence Living Arrangements: Spouse/significant other;Children Available Help at Discharge: Available 24 hours/day Type of Home: House Home Access: Stairs to enter Entrance Stairs-Rails: Can reach both Entrance Stairs-Number of Steps: 4 Home Layout: Able to live on main level with bedroom/bathroom;Two level Home Equipment: Shower seat;Hand held shower head      Prior Function Level of Independence: Independent                  Extremity/Trunk Assessment   Upper Extremity Assessment Upper Extremity Assessment: Generalized weakness    Lower Extremity Assessment Lower Extremity  Assessment: Generalized weakness    Cervical / Trunk Assessment Cervical / Trunk Assessment: Normal  Communication   Communication: No difficulties  Cognition Arousal/Alertness: Awake/alert Behavior During Therapy: WFL for tasks assessed/performed Overall Cognitive Status: Within Functional Limits for tasks assessed                                        General Comments General comments (skin integrity, edema,  etc.): Pt on 3L O2 on entry with SaO2 90%O2, with ambulation and stair training SaO2 91%O2. Pt with audible SoB with ambulation         Assessment/Plan    PT Assessment Patient needs continued PT services  PT Problem List Decreased strength;Decreased balance;Decreased mobility;Cardiopulmonary status limiting activity       PT Treatment Interventions DME instruction;Gait training;Stair training;Functional mobility training;Therapeutic activities;Therapeutic exercise;Balance training;Cognitive remediation;Patient/family education    PT Goals (Current goals can be found in the Care Plan section)  Acute Rehab PT Goals Patient Stated Goal: go home PT Goal Formulation: With patient Time For Goal Achievement: 08/23/18 Potential to Achieve Goals: Good    Frequency Min 3X/week    AM-PAC PT "6 Clicks" Mobility  Outcome Measure Help needed turning from your back to your side while in a flat bed without using bedrails?: None Help needed moving from lying on your back to sitting on the side of a flat bed without using bedrails?: None Help needed moving to and from a bed to a chair (including a wheelchair)?: None Help needed standing up from a chair using your arms (e.g., wheelchair or bedside chair)?: None Help needed to walk in hospital room?: A Little Help needed climbing 3-5 steps with a railing? : A Little 6 Click Score: 22    End of Session Equipment Utilized During Treatment: Gait belt;Oxygen Activity Tolerance: Patient tolerated treatment well Patient left: in chair;with call bell/phone within reach Nurse Communication: Mobility status PT Visit Diagnosis: Unsteadiness on feet (R26.81);Muscle weakness (generalized) (M62.81);Difficulty in walking, not elsewhere classified (R26.2)    Time: 8182-9937 PT Time Calculation (min) (ACUTE ONLY): 22 min   Charges:   PT Evaluation $PT Eval Moderate Complexity: 1 Mod          Chaddrick Brue B. Migdalia Dk PT, DPT Acute Rehabilitation  Services Pager [phone] Office [phone]   [place] 08/09/2018, 1:04 PM

## 2018-08-09 NOTE — Plan of Care (Signed)

## 2018-08-09 NOTE — Progress Notes (Signed)
  Echocardiogram 2D Echocardiogram has been performed.  Randa Lynn Kimi Kroft 08/09/2018, 12:18 PM

## 2018-08-09 NOTE — ED Notes (Addendum)
ED TO INPATIENT HANDOFF REPORT  ED Nurse Name and Phone #: Thurmond Butts [place] Name/Age/Gender [name] 85 y.o. male Room/Bed: 022C/022C  Code Status   Code Status: Prior  Home/SNF/Other Home Patient oriented to: self, place, time and situation Is this baseline? Yes   Triage Complete: Triage complete  Chief Complaint sob  Triage Note  Patient BIB EMS for SOB that has gotten progressively worse over the last week. Patient was seen at [facility] a few days ago and tested negative for COVID.  Patient woke up this morning and could not catch his breath.  SPO2 80% on RA when EMS arrived.  Patient given 1 nitro, 125mg  solumedrol, 2g Mag IV, and on 15L NRB.  Patient is A&O x4.  Patient states he feels better with O2.  Crackles heard in bilateral lungs.     Allergies Allergies  Allergen Reactions  . Cyclobenzaprine Hcl Other (See Comments)    Unknown reaction  . Tetracycline Rash         Level of Care/Admitting Diagnosis ED Disposition    ED Disposition Condition [facility]: [place] [[id number]]  Level of Care: Telemetry Cardiac [103]  I expect the patient will be discharged within 24 hours: No (not a candidate for 5C-Observation unit)  Covid Evaluation: N/A  Diagnosis: Acute systolic CHF (congestive heart failure) ([place]) [941740]  Admitting Physician: Karmen Bongo [[id number]]  Attending Physician: Karmen Bongo [[id number]]  PT Class (Do Not Modify): Observation [104]  PT Acc Code (Do Not Modify): Observation [10022]       B Medical/Surgery History Past Medical History:  Diagnosis Date  . Aortic stenosis, severe    s/p TAVR with Edwards Sapien 3 THV (size 26 mm, model # 9600TFX, serial # [id number])  . Arthritis   . Cardiac arrest ([place])   . Chronic systolic CHF (congestive heart failure) ([place]) 01/21/2018  . Coronary artery disease    NSTEMI 10/19  . Essential hypertension   . Headache   . History of hiatal hernia   . Hyperlipidemia    . Kidney stones   . OSA (obstructive sleep apnea) 02/05/2018   Severe obstructive sleep apnea with an AHI of 36.8/h with no significant central sleep apnea.   Now on CPAP at  5 cm H2O  . Peripheral vascular disease ([place])   . S/P cardiac cath 01/22/18  01/23/2018   Past Surgical History:  Procedure Laterality Date  . ADENOIDECTOMY    . APPENDECTOMY    . CARDIAC CATHETERIZATION   11/16/2009   . CARDIAC DEFIBRILLATOR PLACEMENT    . CARDIOVERSION N/A 02/24/2018   Procedure: CARDIOVERSION;  Surgeon: Sueanne Margarita, MD;  Location: [facility] ENDOSCOPY;  Service: Cardiovascular;  Laterality: N/A;  . cataract surgery      Bilateral cataract surgery  . CORONARY ARTERY BYPASS GRAFT     in 1982 and '84 with subsequent PTCI  . EP IMPLANTABLE DEVICE N/A 03/21/2016   Procedure: ICD Generator Changeout;  Surgeon: Deboraha Sprang, MD;  Location: [facility] CV LAB;  Service: Cardiovascular;  Laterality: N/A;  . HERNIA REPAIR    . LEFT AND RIGHT HEART CATHETERIZATION WITH CORONARY/GRAFT ANGIOGRAM N/A 02/01/2014   Procedure: LEFT AND RIGHT HEART CATHETERIZATION WITH Beatrix Fetters;  Surgeon: Sinclair Grooms, MD;  Location: [facility] CATH LAB;  Service: Cardiovascular;  Laterality: N/A;  . left elbow tendon surgery    . LEFT HEART CATH AND CORS/GRAFTS ANGIOGRAPHY N/A 01/22/2018   Procedure: LEFT  HEART CATH AND CORS/GRAFTS ANGIOGRAPHY;  Surgeon: Leonie Man, MD;  Location: [place] CV LAB;  Service: Cardiovascular;  Laterality: N/A;  . TEE WITHOUT CARDIOVERSION N/A 03/23/2014   Procedure: TRANSESOPHAGEAL ECHOCARDIOGRAM (TEE);  Surgeon: Burnell Blanks, MD;  Location: [facility];  Service: Open Heart Surgery;  Laterality: N/A;  . TONSILLECTOMY    . TRANSCATHETER AORTIC VALVE REPLACEMENT, TRANSFEMORAL N/A 03/23/2014   Procedure: TRANSCATHETER AORTIC VALVE REPLACEMENT, TRANSFEMORAL;  Surgeon: Burnell Blanks, MD;  Location: [facility];  Service: Open Heart Surgery;  Laterality: N/A;     A IV  Location/Drains/Wounds Patient Lines/Drains/Airways Status   Active Line/Drains/Airways    Name:   Placement date:   Placement time:   Site:   Days:   Peripheral IV 08/09/18 Left Hand   08/09/18    0342    Hand   less than 1          Intake/Output Last 24 hours No intake or output data in the 24 hours ending 08/09/18 4174  Labs/Imaging Results for orders placed or performed during the hospital encounter of 08/09/18 (from the past 64 hour(s))  SARS Coronavirus 2 (CEPHEID- Performed in [facility] hospital lab), Hosp Order     Status: None   Collection Time: 08/09/18  3:49 AM  Result Value Ref Range   SARS Coronavirus 2 NEGATIVE NEGATIVE    Comment: (NOTE) If result is NEGATIVE SARS-CoV-2 target nucleic acids are NOT DETECTED. The SARS-CoV-2 RNA is generally detectable in upper and lower  respiratory specimens during the acute phase of infection. The lowest  concentration of SARS-CoV-2 viral copies this assay can detect is 250  copies / mL. A negative result does not preclude SARS-CoV-2 infection  and should not be used as the sole basis for treatment or other  patient management decisions.  A negative result may occur with  improper specimen collection / handling, submission of specimen other  than nasopharyngeal swab, presence of viral mutation(s) within the  areas targeted by this assay, and inadequate number of viral copies  (<250 copies / mL). A negative result must be combined with clinical  observations, patient history, and epidemiological information. If result is POSITIVE SARS-CoV-2 target nucleic acids are DETECTED. The SARS-CoV-2 RNA is generally detectable in upper and lower  respiratory specimens dur ing the acute phase of infection.  Positive  results are indicative of active infection with SARS-CoV-2.  Clinical  correlation with patient history and other diagnostic information is  necessary to determine patient infection status.  Positive results do  not rule  out bacterial infection or co-infection with other viruses. If result is PRESUMPTIVE POSTIVE SARS-CoV-2 nucleic acids MAY BE PRESENT.   A presumptive positive result was obtained on the submitted specimen  and confirmed on repeat testing.  While 2019 novel coronavirus  (SARS-CoV-2) nucleic acids may be present in the submitted sample  additional confirmatory testing may be necessary for epidemiological  and / or clinical management purposes  to differentiate between  SARS-CoV-2 and other Sarbecovirus currently known to infect humans.  If clinically indicated additional testing with an alternate test  methodology [phone]) is advised. The SARS-CoV-2 RNA is generally  detectable in upper and lower respiratory sp ecimens during the acute  phase of infection. The expected result is Negative. Fact Sheet for Patients:  StrictlyIdeas.no Fact Sheet for Healthcare Providers: BankingDealers.co.za This test is not yet approved or cleared by the [place] FDA and has been authorized for detection and/or diagnosis of SARS-CoV-2 by FDA  under an Emergency Use Authorization (EUA).  This EUA will remain in effect (meaning this test can be used) for the duration of the COVID-19 declaration under Section 564(b)(1) of the Act, 21 U.S.C. section 360bbb-3(b)(1), unless the authorization is terminated or revoked sooner. Performed at [facility], [employer] [address], [place], [place] [address]   Brain natriuretic peptide (order ONLY if patient c/o SOB)     Status: Abnormal   Collection Time: 08/09/18  4:12 AM  Result Value Ref Range   B Natriuretic Peptide 771.0 (H) 0.0 - 100.0 pg/mL    Comment: Performed at [facility] [address], [place], [place] [address]  CBC with Differential/Platelet     Status: Abnormal   Collection Time: 08/09/18  4:12 AM  Result Value Ref Range   WBC 9.8 4.0 - 10.5 K/uL   RBC 4.36 4.22 - 5.81 MIL/uL   Hemoglobin  9.2 (L) 13.0 - 17.0 g/dL   HCT 31.5 (L) 39.0 - 52.0 %   MCV 72.2 (L) 80.0 - 100.0 fL   MCH 21.1 (L) 26.0 - 34.0 pg   MCHC 29.2 (L) 30.0 - 36.0 g/dL   RDW 23.9 (H) 11.5 - 15.5 %   Platelets 210 150 - 400 K/uL   nRBC 0.2 0.0 - 0.2 %   Neutrophils Relative % 73 %   Neutro Abs 7.2 1.7 - 7.7 K/uL   Lymphocytes Relative 15 %   Lymphs Abs 1.5 0.7 - 4.0 K/uL   Monocytes Relative 10 %   Monocytes Absolute 0.9 0.1 - 1.0 K/uL   Eosinophils Relative 1 %   Eosinophils Absolute 0.1 0.0 - 0.5 K/uL   Basophils Relative 1 %   Basophils Absolute 0.1 0.0 - 0.1 K/uL   Immature Granulocytes 0 %   Abs Immature Granulocytes 0.03 0.00 - 0.07 K/uL    Comment: Performed at [facility], [place] [address] [place], [place] [address]  Troponin I - ONCE - STAT     Status: None   Collection Time: 08/09/18  6:52 AM  Result Value Ref Range   Troponin I <0.03 <0.03 ng/mL    Comment: Performed at [facility] [address] [place], [place] [address]  Comprehensive metabolic panel     Status: Abnormal   Collection Time: 08/09/18  6:52 AM  Result Value Ref Range   Sodium 136 135 - 145 mmol/L   Potassium 4.8 3.5 - 5.1 mmol/L   Chloride 108 98 - 111 mmol/L   CO2 18 (L) 22 - 32 mmol/L   Glucose, Bld 125 (H) 70 - 99 mg/dL   BUN 31 (H) 8 - 23 mg/dL   Creatinine, Ser 1.69 (H) 0.61 - 1.24 mg/dL   Calcium 10.1 8.9 - 10.3 mg/dL   Total Protein 7.0 6.5 - 8.1 g/dL   Albumin 4.0 3.5 - 5.0 g/dL   AST 22 15 - 41 U/L   ALT 23 0 - 44 U/L   Alkaline Phosphatase 53 38 - 126 U/L   Total Bilirubin 0.9 0.3 - 1.2 mg/dL   GFR calc non Af Amer 37 (L) >60 mL/min   GFR calc Af Amer 43 (L) >60 mL/min   Anion gap 10 5 - 15    Comment: Performed at [facility] [address], [place], [place] [address]   Dg Chest Portable 1 View  Result Date: 08/09/2018 CLINICAL DATA:  Worsening shortness of breath over the past week. EXAM: PORTABLE CHEST 1  VIEW COMPARISON:  Chest x-ray dated August 01, 2018. FINDINGS: Unchanged  left chest wall pacemaker. Stable cardiomegaly status post CABG and TAVR. New diffusely increased interstitial markings. New small bilateral pleural effusions and left greater than right bibasilar atelectasis. No pneumothorax. No acute osseous abnormality. IMPRESSION: 1. New mild interstitial pulmonary edema and small bilateral pleural effusions, consistent with congestive heart failure. Electronically Signed   By: Titus Dubin M.D.   On: 08/09/2018 04:46    Pending Labs Unresulted Labs (From admission, onward)    Start     Ordered   08/09/18 0350  Urinalysis, Routine w reflex microscopic  ONCE - STAT,   STAT     08/09/18 0350          Vitals/Pain Today's Vitals   08/09/18 0545 08/09/18 0730 08/09/18 0800 08/09/18 0830  BP: (!) 146/68 (!) 123/59 129/63 125/65  Pulse: 66 60 60 60  Resp: (!) 22 20 (!) 22 (!) 31  Temp:      TempSrc:      SpO2: 93% 99% 96% (!) 87%  Weight:      Height:      PainSc:        Isolation Precautions  Medications Medications  furosemide (LASIX) injection 80 mg (80 mg Intravenous Given 08/09/18 4628)    Mobility walks Low fall risk   Focused Assessments Cardiac Assessment Handoff:    Lab Results  Component Value Date   CKTOTAL 1,645 (H) 08/30/2009   CKMB (HH) 08/30/2009    371.8 RESULTS CONFIRMED BY MANUAL DILUTION CRITICAL VALUE NOTED.  VALUE IS CONSISTENT WITH PREVIOUSLY REPORTED AND CALLED VALUE.   TROPONINI <0.03 08/09/2018   No results found for: DDIMER Does the Patient currently have chest pain? No     R Recommendations: See Admitting Provider Note  Report given to: Earlie Server, RN  Additional Notes:

## 2018-08-09 NOTE — H&P (Signed)
History and Physical    [name] OZD:[id number] DOB: 18-Jan-1934 DOA: 08/09/2018  PCP: Binnie Rail, MD Consultants:  Angelena Form - cardiology Patient coming from:  Home - lives with wife; NOK: Wife, [phone], [phone]  Chief Complaint: SOB  HPI: [name] is a 84 y.o. male with medical history significant of PVD; OSA; CAD; HLD; HTN; chronic systolic CHF with AICD; afib on Xarelto; and AS s/p TAVR presenting with SOB.  He has had SOB and suboptimal kidney function.  He has been feeling poorly for the last week, extremely weak.  SOB is with exertion.  No orthopnea.  No PND.  No chest pain.  N o change on Lasix dose.  No LE edema.  No weight changes.  No fevers.  No sick contacts.  He is not on home O2.  He wears a CPAP but does not wear consistently.   ED Course:  CHF exacerbation - on 4L O2 currently with sat 92%.  Given Lasix, looks overloaded and CXR looks wet.  COVID negative today and on 4/24.  Review of Systems: As per HPI; otherwise review of systems reviewed and negative.   Ambulatory Status:  Ambulates without assistance  Past Medical History:  Diagnosis Date  . Aortic stenosis, severe    s/p TAVR with Edwards Sapien 3 THV (size 26 mm, model # 9600TFX, serial # [id number])  . Arthritis   . Cardiac arrest ([place])   . Chronic systolic CHF (congestive heart failure) ([place]) 01/21/2018  . Coronary artery disease    NSTEMI 10/19  . Essential hypertension   . Headache   . History of hiatal hernia   . Hyperlipidemia   . Kidney stones   . OSA (obstructive sleep apnea) 02/05/2018   Severe obstructive sleep apnea with an AHI of 36.8/h with no significant central sleep apnea.   Now on CPAP at  5 cm H2O  . Peripheral vascular disease ([place])   . S/P cardiac cath 01/22/18  01/23/2018    Past Surgical History:  Procedure Laterality Date  . ADENOIDECTOMY    . APPENDECTOMY    . CARDIAC CATHETERIZATION   11/16/2009   . CARDIAC DEFIBRILLATOR PLACEMENT    . CARDIOVERSION N/A  02/24/2018   Procedure: CARDIOVERSION;  Surgeon: Sueanne Margarita, MD;  Location: [facility] ENDOSCOPY;  Service: Cardiovascular;  Laterality: N/A;  . cataract surgery      Bilateral cataract surgery  . CORONARY ARTERY BYPASS GRAFT     in 1982 and '84 with subsequent PTCI  . EP IMPLANTABLE DEVICE N/A 03/21/2016   Procedure: ICD Generator Changeout;  Surgeon: Deboraha Sprang, MD;  Location: [place] CV LAB;  Service: Cardiovascular;  Laterality: N/A;  . HERNIA REPAIR    . LEFT AND RIGHT HEART CATHETERIZATION WITH CORONARY/GRAFT ANGIOGRAM N/A 02/01/2014   Procedure: LEFT AND RIGHT HEART CATHETERIZATION WITH Beatrix Fetters;  Surgeon: Sinclair Grooms, MD;  Location: [facility] CATH LAB;  Service: Cardiovascular;  Laterality: N/A;  . left elbow tendon surgery    . LEFT HEART CATH AND CORS/GRAFTS ANGIOGRAPHY N/A 01/22/2018   Procedure: LEFT HEART CATH AND CORS/GRAFTS ANGIOGRAPHY;  Surgeon: Leonie Man, MD;  Location: [place] CV LAB;  Service: Cardiovascular;  Laterality: N/A;  . TEE WITHOUT CARDIOVERSION N/A 03/23/2014   Procedure: TRANSESOPHAGEAL ECHOCARDIOGRAM (TEE);  Surgeon: Burnell Blanks, MD;  Location: [place];  Service: Open Heart Surgery;  Laterality: N/A;  . TONSILLECTOMY    . TRANSCATHETER AORTIC VALVE REPLACEMENT, TRANSFEMORAL N/A 03/23/2014  Procedure: TRANSCATHETER AORTIC VALVE REPLACEMENT, TRANSFEMORAL;  Surgeon: Burnell Blanks, MD;  Location: [place];  Service: Open Heart Surgery;  Laterality: N/A;    Social History   Socioeconomic History  . Marital status: Married    Spouse name: Not on file  . Number of children: 3  . Years of education: Not on file  . Highest education level: Not on file  Occupational History  . Occupation: Retired  Scientific laboratory technician  . Financial resource strain: Not hard at all  . Food insecurity:    Worry: Never true    Inability: Never true  . Transportation needs:    Medical: No    Non-medical: No  Tobacco Use  . Smoking status:  Former Smoker    Last attempt to quit: 10/25/1952    Years since quitting: 65.8  . Smokeless tobacco: Never Used  Substance and Sexual Activity  . Alcohol use: Yes    Alcohol/week: 5.0 - 6.0 standard drinks    Types: 5 - 6 Glasses of wine per week    Comment: 5-6 times a week  . Drug use: No  . Sexual activity: Never  Lifestyle  . Physical activity:    Days per week: Not on file    Minutes per session: Not on file  . Stress: Only a little  Relationships  . Social connections:    Talks on phone: More than three times a week    Gets together: More than three times a week    Attends religious service: More than 4 times per year    Active member of club or organization: Not on file    Attends meetings of clubs or organizations: More than 4 times per year    Relationship status: Married  . Intimate partner violence:    Fear of current or ex partner: No    Emotionally abused: No    Physically abused: No    Forced sexual activity: No  Other Topics Concern  . Not on file  Social History Narrative  . Not on file    Allergies  Allergen Reactions  . Cyclobenzaprine Hcl Other (See Comments)    Unknown reaction  . Tetracycline Rash         Family History  Problem Relation Age of Onset  . Heart attack Father 65  . Coronary artery disease Other        family history is dignificant for early CAD in several members    Prior to Admission medications   Medication Sig Start Date End Date Taking? Authorizing Provider  amiodarone (PACERONE) 200 MG tablet Take 1 tablet (200 mg total) by mouth daily. 02/22/18   Lyda Jester M, PA-C  amLODipine (NORVASC) 5 MG tablet TAKE 1 TABLET BY MOUTH DAILY. Patient taking differently: Take 5 mg by mouth daily.  12/23/17   Burnell Blanks, MD  amoxicillin (AMOXIL) 500 MG tablet Take 4 tablets by mouth 60 minutes prior to dental appointments 12/11/17   Burnell Blanks, MD  Calcium Carbonate Antacid (TUMS PO) Take 2 tablets by  mouth 2 (two) times daily as needed (indigestion/ acid reflux).    [provider]  Coenzyme Q10-Vitamin E (QUNOL ULTRA COQ10 PO) Take 15 mLs by mouth at bedtime.     [provider]  diazepam (VALIUM) 5 MG tablet TAKE 1 TABLET BY MOUTH ONCE DAILY AS NEEDED 05/19/18   Burnell Blanks, MD  furosemide (LASIX) 40 MG tablet TAKE 1 TABLET (40 MG TOTAL) BY MOUTH DAILY.  07/31/18   Burnell Blanks, MD  ketotifen (ZADITOR) 0.025 % ophthalmic solution Place 1 drop into both eyes daily as needed (seasonal allergies).    [provider]  KRILL OIL PO Take 1 capsule by mouth at bedtime.     [provider]  latanoprost (XALATAN) 0.005 % ophthalmic solution Place 1 drop into both eyes at bedtime.  05/28/11   [provider]  Magnesium Oxide (MAG-OXIDE PO) Take 1 tablet by mouth at bedtime.     [provider]  Melatonin 1 MG TABS Take 2-3 mg by mouth at bedtime as needed (sleep).    [provider]  metoprolol tartrate (LOPRESSOR) 50 MG tablet Take 0.5 tablets (25 mg total) by mouth at bedtime. 02/24/18   Sueanne Margarita, MD  Multiple Vitamin (MULTIVITAMIN WITH MINERALS) TABS tablet Take 1 tablet by mouth daily.    [provider]  nitroGLYCERIN (NITROSTAT) 0.4 MG SL tablet Place 1 tablet (0.4 mg total) under the tongue every 5 (five) minutes as needed for chest pain (for chest pain (MAX of 3 doses)). 04/12/15   Burnell Blanks, MD  Potassium 99 MG TABS Take 99 mg by mouth 2 (two) times daily.    [provider]  Propylene Glycol (SYSTANE COMPLETE) 0.6 % SOLN Place 1 drop into both eyes daily as needed (dry eyes).     [provider]  Rivaroxaban (XARELTO) 15 MG TABS tablet Take 1 tablet (15 mg total) by mouth daily with supper. 02/13/18   Burnell Blanks, MD  rosuvastatin (CRESTOR) 40 MG tablet TAKE 1 TABLET BY MOUTH DAILY. Patient taking differently: Take 40 mg by mouth every evening.  12/23/17    Burnell Blanks, MD  Thiamine HCl (VITAMIN B-1) 100 MG tablet Take 100 mg by mouth at bedtime.     [provider]  trospium (SANCTURA) 20 MG tablet Take 20 mg by mouth 2 (two) times daily.    [provider]    Physical Exam: Vitals:   08/09/18 0730 08/09/18 0800 08/09/18 0830 08/09/18 0906  BP: (!) 123/59 129/63 125/65 (!) 123/59  Pulse: 60 60 60 (!) 59  Resp: 20 (!) 22 (!) 31 (!) 29  Temp:      TempSrc:      SpO2: 99% 96% (!) 87% 90%  Weight:      Height:         . General:  Appears calm and comfortable and is NAD - however, he remains hypoxic, now on 5L O2 . Eyes:  EOMI, normal lids, iris . ENT:  grossly normal hearing, lips & tongue, mmm . Neck:  no LAD, masses or thyromegaly . Cardiovascular:  RRR, no r/g, 2/6 systolic murmur. No LE edema.  Marland Kitchen Respiratory:   CTA bilaterally with scant bibasilar crackles.  Normal respiratory effort.  87-90% on 4L [place] O2. . Abdomen:  soft, NT, ND, NABS . Skin:  no rash or induration seen on limited exam . Musculoskeletal:  grossly normal tone BUE/BLE, good ROM, no bony abnormality . Psychiatric:  grossly normal mood and affect, speech fluent and appropriate, AOx3 . Neurologic:  CN 2-12 grossly intact, moves all extremities in coordinated fashion, sensation intact    Radiological Exams on Admission: Dg Chest Portable 1 View  Result Date: 08/09/2018 CLINICAL DATA:  Worsening shortness of breath over the past week. EXAM: PORTABLE CHEST 1 VIEW COMPARISON:  Chest x-ray dated August 01, 2018. FINDINGS: Unchanged left chest wall pacemaker. Stable cardiomegaly status post CABG  and TAVR. New diffusely increased interstitial markings. New small bilateral pleural effusions and left greater than right bibasilar atelectasis. No pneumothorax. No acute osseous abnormality. IMPRESSION: 1. New mild interstitial pulmonary edema and small bilateral pleural effusions, consistent with congestive heart failure. Electronically Signed   By:  Titus Dubin M.D.   On: 08/09/2018 04:46    EKG: Independently reviewed.  Ventricularly paced with rate 65   Labs on Admission: I have personally reviewed the available labs and imaging studies at the time of the admission.  Pertinent labs:   CO2 18 Glucose 125 BUN 31/Creatinine 1.69/GFR 37 - stable BNP 771.0; 951.6 on 10/14 Troponin <0.03 WBC 9.8 Hgb 9.2 - stable  Assessment/Plan Principal Problem:   Acute on chronic systolic CHF (congestive heart failure) (HCC) Active Problems:   Atrial fibrillation (HCC)   Hypercholesteremia   OSA (obstructive sleep apnea)   Essential hypertension   CKD (chronic kidney disease) stage 3, GFR 30-59 ml/min (HCC)   Acute respiratory failure with hypoxia, likely associated with systolic CHF exacerbation -Patient with significant cardiac history presenting with worsening DOE and hypoxia  -He does not have significant edema and his weight is unchanged -He was seen in the ER on 4/24 for weakness/fatigue, lack of taste for >1 week, and also c/o mild DOE at that time; COVID testing was negative and he was found to have worsening renal failure but appeared stable and was discharged -Symptoms have progressed and his CXR now indicates pulmonary edema -His BNP is also elevated -Suspect that patient has been having subacute CHF exacerbation which has continued to progress -Will place in observation status with telemetry -Will request repeat echocardiogram - his last Echo was in 11/19 after NSTEMI; it showed EF 40-45%  -His cath in 10/19 showed severe CAD with patent stents and no clearly identified culprit lesion -Will start ASA -No ACE due to CKD -He is on short-acting metoprolol and is only taking once daily; will change to Coreg 6.25 mg BID for now -Will hold Norvasc and suggest consideration of alternative medication since this can be associated with edema -CHF order set utilized; may need CHF team consult but will hold until Echo results are  available -Was given Lasix 80 mg x 1 in ER and will repeat with 40 mg IV BID - with plan for close monitoring of renal function -Continue [place] O2 for now -Repeat EKG in AM -Has AICD  CKD -He appears to have progressed from stage 2 CKD to worsening stage 3 CKD about a year ago -It has been stable since that time -He has an outpatient nephrology consult scheduled for this coming Thursday -Assuming renal function is stable with diuresis, outpatient f/u is likely reasonable -Unfortunately, he is not an ACE candidate at this time -His AC agent may need to be changed in the future due to his CKD  HTN -Takes Norvasc, Lopressor at home -As noted above, will hold Norvasc and change Lopressor to Coreg  HLD -Continue Crestor -Lipids were checked in 10/19 with LDL 58 so will not recheck  OSA -Intermittent use of home CPAP -Will order CPAP  Afib -rate controlled with pacer and Amiodarone -On Xarelto for [facility] 2  DVT prophylaxis: Xarelto Code Status:  Full - confirmed with patient Family Communication: None present Disposition Plan:  Home once clinically improved Consults called: CM/PT Admission status:  It is my clinical opinion that referral for OBSERVATION is reasonable and necessary in this patient based on the above information provided. The aforementioned taken together are  felt to place the patient at high risk for further clinical deterioration. However it is anticipated that the patient may be medically stable for discharge from the hospital within 24 to 48 hours.     Karmen Bongo MD [facility]   How to contact the [facility] Attending or Consulting provider Halfway or covering provider during after hours [place], for this patient?  1. Check the care team in [facility] and look for a) attending/consulting [facility] provider listed and b) the [facility] team listed 2. Log into [email or web address] and use [place]'s universal password to access. If you do not have the password, please contact the hospital  operator. 3. Locate the [facility] provider you are looking for under [facility] and page to a number that you can be directly reached. 4. If you still have difficulty reaching the provider, please page the [facility] (Director on Call) for the Hospitalists listed on amion for assistance.   08/09/2018, 9:18 AM

## 2018-08-09 NOTE — ED Triage Notes (Signed)
  Patient BIB EMS for SOB that has gotten progressively worse over the last week. Patient was seen at [facility] a few days ago and tested negative for COVID.  Patient woke up this morning and could not catch his breath.  SPO2 80% on RA when EMS arrived.  Patient given 1 nitro, 125mg  solumedrol, 2g Mag IV, and on 15L NRB.  Patient is A&O x4.  Patient states he feels better with O2.  Crackles heard in bilateral lungs.

## 2018-08-09 NOTE — ED Provider Notes (Signed)
Emergency Department Provider Note   I have reviewed the triage vital signs and the nursing notes.   HISTORY  Chief Complaint Shortness of Breath   HPI [name] is a 85 y.o. male with 1 week of progressively worsening dyspnea especially with exertion.  Worse when he lays flat.  Got even worse tonight so called EMS and on their arrival was 80% on room air so started on a nonrebreather with resolution of symptoms.  They gave magnesium, nitroglycerin, Solu-Medrol in route.  Vital signs within normal limits in route.   She states no recent changes in medications.  No recent illnesses.  No fevers or cough.  No other associated or modifying symptoms.    Past Medical History:  Diagnosis Date  . Aortic stenosis, severe    s/p TAVR with Edwards Sapien 3 THV (size 26 mm, model # 9600TFX, serial # [id number])  . Arthritis   . Cardiac arrest ([place])   . Chronic systolic CHF (congestive heart failure) ([place]) 01/21/2018  . Claudication ([place])   . Coronary artery disease   . Essential hypertension   . Headache   . History of hiatal hernia   . Hyperlipidemia   . Kidney stones   . Non-ST elevation (NSTEMI) myocardial infarction ([place]) 01/21/2018  . OSA (obstructive sleep apnea) 02/05/2018   Severe obstructive sleep apnea with an AHI of 36.8/h with no significant central sleep apnea.   Now on CPAP at  5 cm H2O  . Peripheral vascular disease ([place])   . S/P cardiac cath 01/22/18  01/23/2018    Patient Active Problem List   Diagnosis Date Noted  . AKI (acute kidney injury) ([place]) 03/03/2018  . Constipation 03/03/2018  . Anemia 03/03/2018  . OSA (obstructive sleep apnea) 02/05/2018  . S/P cardiac cath 01/22/18 stable -treating medically  01/23/2018  . Atrial tachycardia ([place]) 01/22/2018  . Non-ST elevation (NSTEMI) myocardial infarction ([place]) 01/21/2018  . Chronic systolic CHF (congestive heart failure) ([place]) 01/21/2018  . Atrial flutter with rapid ventricular response ([place]) 01/20/2018  .  Sleep difficulties 07/29/2015  . Erectile disorder due to medical condition in male patient 12/17/2014  . Severe aortic valve stenosis - s/p  Edwards Sapien 3 THV (size 26 mm, model # 9600TFX, serial # [id number]) 03/23/2014 03/23/2014  . Abnormal nuclear stress test 01/29/2014  . Ischemic cardiomyopathy 01-19-2012  . 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 Back pain 07/10/2010  . Hypercholesteremia 07/10/2010  . Benign hypertensive heart disease without heart failure 07/10/2010  . S/P CABG (coronary artery bypass graft) 1982 and re-do 1994  07/10/2010  . Implantable cardioverter-defibrillator (ICD) in situ 03/30/2010  . Atrial fibrillation ([place]) 11/27/2009    Past Surgical History:  Procedure Laterality Date  . ADENOIDECTOMY    . APPENDECTOMY    . CARDIAC CATHETERIZATION   11/16/2009   . CARDIAC DEFIBRILLATOR PLACEMENT    . CARDIOVERSION N/A 02/24/2018   Procedure: CARDIOVERSION;  Surgeon: Sueanne Margarita, MD;  Location: [facility] ENDOSCOPY;  Service: Cardiovascular;  Laterality: N/A;  . cataract surgery      Bilateral cataract surgery  . CORONARY ARTERY BYPASS GRAFT     in 1982 and '84 with subsequent PTCI  . EP IMPLANTABLE DEVICE N/A 03/21/2016   Procedure: ICD Generator Changeout;  Surgeon: Deboraha Sprang, MD;  Location: [facility] CV LAB;  Service: Cardiovascular;  Laterality: N/A;  . HERNIA REPAIR    . LEFT AND RIGHT HEART CATHETERIZATION WITH CORONARY/GRAFT ANGIOGRAM N/A 02/01/2014   Procedure: LEFT AND RIGHT  HEART CATHETERIZATION WITH Beatrix Fetters;  Surgeon: Sinclair Grooms, MD;  Location: [facility] CATH LAB;  Service: Cardiovascular;  Laterality: N/A;  . left elbow tendon surgery    . LEFT HEART CATH AND CORS/GRAFTS ANGIOGRAPHY N/A 01/22/2018   Procedure: LEFT HEART CATH AND CORS/GRAFTS ANGIOGRAPHY;  Surgeon: Leonie Man, MD;  Location: [place] CV LAB;  Service: Cardiovascular;  Laterality: N/A;  . TEE WITHOUT CARDIOVERSION N/A 03/23/2014   Procedure:  TRANSESOPHAGEAL ECHOCARDIOGRAM (TEE);  Surgeon: Burnell Blanks, MD;  Location: [facility];  Service: Open Heart Surgery;  Laterality: N/A;  . TONSILLECTOMY    . TRANSCATHETER AORTIC VALVE REPLACEMENT, TRANSFEMORAL N/A 03/23/2014   Procedure: TRANSCATHETER AORTIC VALVE REPLACEMENT, TRANSFEMORAL;  Surgeon: Burnell Blanks, MD;  Location: [place];  Service: Open Heart Surgery;  Laterality: N/A;    Current Outpatient Rx  . Order #: [id number] Class: Normal  . Order #: [id number] Class: Normal  . Order #: [id number] Class: Normal  . Order #: [id number] Class: Historical Med  . Order #: [id number] Class: Historical Med  . Order #: [id number] Class: Phone In  . Order #: [id number] Class: Normal  . Order #: [id number] Class: Historical Med  . Order #: [id number] Class: Historical Med  . Order #: [id number] Class: Historical Med  . Order #: [id number] Class: Historical Med  . Order #: [id number] Class: Historical Med  . Order #: [id number] Class: No Print  . Order #: [id number] Class: Historical Med  . Order #: [id number] Class: Normal  . Order #: [id number] Class: Historical Med  . Order #: [id number] Class: Historical Med  . Order #: [id number] Class: Normal  . Order #: [id number] Class: Normal  . Order #: [id number] Class: Historical Med  . Order #: [id number] Class: Historical Med    Allergies Cyclobenzaprine hcl and Tetracycline  Family History  Problem Relation Age of Onset  . Heart attack Father 26  . Coronary artery disease Other        family history is dignificant for early CAD in several members    Social History Social History   Tobacco Use  . Smoking status: Former Smoker    Last attempt to quit: 10/25/1952    Years since quitting: 65.8  . Smokeless tobacco: Never Used  Substance Use Topics  . Alcohol use: Yes    Alcohol/week: 5.0 - 6.0 standard drinks    Types: 5 - 6 Glasses of wine per week    Comment: 5-6 times a week  . Drug use: No    Review of Systems  All other systems negative except  as documented in the HPI. All pertinent positives and negatives as reviewed in the HPI. ____________________________________________   PHYSICAL EXAM:  VITAL SIGNS: ED Triage Vitals  Enc Vitals Group     BP 08/09/18 0348 (!) 158/65     Pulse Rate 08/09/18 0348 66     Resp 08/09/18 0348 (!) 22     Temp 08/09/18 0348 97.7 F (36.5 C)     Temp Source 08/09/18 0348 Oral     SpO2 08/09/18 0348 97 %     Weight 08/09/18 0349 152 lb 1.9 oz (69 kg)     Height 08/09/18 0349 5\' 6"  (1.676 m)    Constitutional: Alert and oriented. Well appearing and in no acute distress. Eyes: Conjunctivae are normal. PERRL. EOMI. Head: Atraumatic. Nose: No congestion/rhinnorhea. Mouth/Throat: Mucous membranes are moist.  Oropharynx non-erythematous. Neck: No stridor.  No meningeal signs.   Cardiovascular: Normal rate, regular rhythm. Good peripheral circulation. Grossly normal heart sounds.   Respiratory: Tachypneic respiratory effort.  No retractions. Lungs  diminished with rales in the bases. Gastrointestinal: Soft and nontender. No distention.  Musculoskeletal: No lower extremity tenderness nor edema. No gross deformities of extremities. Neurologic:  Normal speech and language. No gross focal neurologic deficits are appreciated.  Skin:  Skin is warm, dry and intact. No rash noted.   ____________________________________________   LABS (all labs ordered are listed, but only abnormal results are displayed)  Labs Reviewed  BRAIN NATRIURETIC PEPTIDE - Abnormal; Notable for the following components:      Result Value   B Natriuretic Peptide 771.0 (*)    All other components within normal limits  CBC WITH DIFFERENTIAL/PLATELET - Abnormal; Notable for the following components:   Hemoglobin 9.2 (*)    HCT 31.5 (*)    MCV 72.2 (*)    MCH 21.1 (*)    MCHC 29.2 (*)    RDW 23.9 (*)    All other components within normal limits  SARS CORONAVIRUS 2 (HOSPITAL ORDER, [place] [facility])   URINALYSIS, ROUTINE W REFLEX MICROSCOPIC  TROPONIN I  COMPREHENSIVE METABOLIC PANEL   ____________________________________________  EKG   EKG Interpretation  Date/Time:  Saturday Aug 09 2018 03:51:02 EDT Ventricular Rate:  65 PR Interval:    QRS Duration: 221 QT Interval:  524 QTC Calculation: 545 R Axis:   -64 Text Interpretation:  Ventricular-paced rhythm No further analysis attempted due to paced rhythm Confirmed by Merrily Pew [phone]) on 08/09/2018 4:05:41 AM       ____________________________________________  RADIOLOGY  Dg Chest Portable 1 View  Result Date: 08/09/2018 CLINICAL DATA:  Worsening shortness of breath over the past week. EXAM: PORTABLE CHEST 1 VIEW COMPARISON:  Chest x-ray dated August 01, 2018. FINDINGS: Unchanged left chest wall pacemaker. Stable cardiomegaly status post CABG and TAVR. New diffusely increased interstitial markings. New small bilateral pleural effusions and left greater than right bibasilar atelectasis. No pneumothorax. No acute osseous abnormality. IMPRESSION: 1. New mild interstitial pulmonary edema and small bilateral pleural effusions, consistent with congestive heart failure. Electronically Signed   By: Titus Dubin M.D.   On: 08/09/2018 04:46    ____________________________________________   PROCEDURES  Procedure(s) performed:   Procedures  CRITICAL CARE Performed by: Merrily Pew Total critical care time: 35 minutes Critical care time was exclusive of separately billable procedures and treating other patients. Critical care was necessary to treat or prevent imminent or life-threatening deterioration. Critical care was time spent personally by me on the following activities: development of treatment plan with patient and/or surrogate as well as nursing, discussions with consultants, evaluation of patient's response to treatment, examination of patient, obtaining history from patient or surrogate, ordering and performing  treatments and interventions, ordering and review of laboratory studies, ordering and review of radiographic studies, pulse oximetry and re-evaluation of patient's condition.  ____________________________________________   INITIAL IMPRESSION / ASSESSMENT AND PLAN / ED COURSE  GERMAIN KOOPMANN was evaluated in Emergency Department on 08/09/2018 for the symptoms described in the history of present illness. He was evaluated in the context of the global COVID-19 pandemic, which necessitated consideration that the patient might be at risk for infection with the SARS-CoV-2 virus that causes COVID-19. Institutional protocols and algorithms that pertain to the evaluation of patients at risk for COVID-19 are in a state of rapid change based on information released by regulatory bodies including the CDC and federal and state organizations. These policies and algorithms were followed during the patient's care in the ED.  Suspect patient has pulmonary edema.  Will  work on weaning his oxygen and diuresing him if x-ray consistent.  Will likely need admission.  Some type of difficulty with hemolyzed labs and obtain a new labs and thus delayed the emergency department.  Patient with improving oxygenation after Lasix as he had obvious pulmonary edema on his chest x-ray which is consistent with his symptoms.  Low suspicion for infectious cause at this time will hold on antibiotics.  Pending BMP and troponin and reassessment for likely admission at time of checkout.  Pertinent labs & imaging results that were available during my care of the patient were reviewed by me and considered in my medical decision making (see chart for details).  ____________________________________________  FINAL CLINICAL IMPRESSION(S) / ED DIAGNOSES  Final diagnoses:  Hypoxia     MEDICATIONS GIVEN DURING THIS VISIT:  Medications  furosemide (LASIX) injection 80 mg (80 mg Intravenous Given 08/09/18 0607)     NEW OUTPATIENT MEDICATIONS  STARTED DURING THIS VISIT:  New Prescriptions   No medications on file    Note:  This note was prepared with assistance of Dragon voice recognition software. Occasional wrong-word or sound-a-like substitutions may have occurred due to the inherent limitations of voice recognition software.   Darcell Sabino, Corene Cornea, MD 08/09/18 [phone]

## 2018-08-10 ENCOUNTER — Encounter: Payer: Self-pay | Admitting: Internal Medicine

## 2018-08-10 DIAGNOSIS — I252 Old myocardial infarction: Secondary | ICD-10-CM | POA: Diagnosis not present

## 2018-08-10 DIAGNOSIS — Z8249 Family history of ischemic heart disease and other diseases of the circulatory system: Secondary | ICD-10-CM | POA: Diagnosis not present

## 2018-08-10 DIAGNOSIS — Z951 Presence of aortocoronary bypass graft: Secondary | ICD-10-CM | POA: Diagnosis not present

## 2018-08-10 DIAGNOSIS — I255 Ischemic cardiomyopathy: Secondary | ICD-10-CM | POA: Diagnosis present

## 2018-08-10 DIAGNOSIS — Z79899 Other long term (current) drug therapy: Secondary | ICD-10-CM | POA: Diagnosis not present

## 2018-08-10 DIAGNOSIS — I5033 Acute on chronic diastolic (congestive) heart failure: Secondary | ICD-10-CM | POA: Diagnosis not present

## 2018-08-10 DIAGNOSIS — I130 Hypertensive heart and chronic kidney disease with heart failure and stage 1 through stage 4 chronic kidney disease, or unspecified chronic kidney disease: Secondary | ICD-10-CM | POA: Diagnosis not present

## 2018-08-10 DIAGNOSIS — Z95810 Presence of automatic (implantable) cardiac defibrillator: Secondary | ICD-10-CM | POA: Diagnosis not present

## 2018-08-10 DIAGNOSIS — Z7901 Long term (current) use of anticoagulants: Secondary | ICD-10-CM | POA: Diagnosis not present

## 2018-08-10 DIAGNOSIS — R0902 Hypoxemia: Secondary | ICD-10-CM | POA: Diagnosis present

## 2018-08-10 DIAGNOSIS — G4733 Obstructive sleep apnea (adult) (pediatric): Secondary | ICD-10-CM | POA: Diagnosis not present

## 2018-08-10 DIAGNOSIS — N183 Chronic kidney disease, stage 3 (moderate): Secondary | ICD-10-CM | POA: Diagnosis present

## 2018-08-10 DIAGNOSIS — I2510 Atherosclerotic heart disease of native coronary artery without angina pectoris: Secondary | ICD-10-CM | POA: Diagnosis not present

## 2018-08-10 DIAGNOSIS — J9601 Acute respiratory failure with hypoxia: Secondary | ICD-10-CM | POA: Diagnosis not present

## 2018-08-10 DIAGNOSIS — I509 Heart failure, unspecified: Secondary | ICD-10-CM

## 2018-08-10 DIAGNOSIS — E785 Hyperlipidemia, unspecified: Secondary | ICD-10-CM | POA: Diagnosis not present

## 2018-08-10 DIAGNOSIS — Z20828 Contact with and (suspected) exposure to other viral communicable diseases: Secondary | ICD-10-CM | POA: Diagnosis not present

## 2018-08-10 DIAGNOSIS — D649 Anemia, unspecified: Secondary | ICD-10-CM | POA: Diagnosis present

## 2018-08-10 DIAGNOSIS — I739 Peripheral vascular disease, unspecified: Secondary | ICD-10-CM | POA: Diagnosis not present

## 2018-08-10 DIAGNOSIS — Z87891 Personal history of nicotine dependence: Secondary | ICD-10-CM | POA: Diagnosis not present

## 2018-08-10 DIAGNOSIS — I5023 Acute on chronic systolic (congestive) heart failure: Secondary | ICD-10-CM | POA: Diagnosis not present

## 2018-08-10 DIAGNOSIS — I4819 Other persistent atrial fibrillation: Secondary | ICD-10-CM | POA: Diagnosis not present

## 2018-08-10 DIAGNOSIS — E7800 Pure hypercholesterolemia, unspecified: Secondary | ICD-10-CM | POA: Diagnosis not present

## 2018-08-10 DIAGNOSIS — Z952 Presence of prosthetic heart valve: Secondary | ICD-10-CM | POA: Diagnosis not present

## 2018-08-10 DIAGNOSIS — N179 Acute kidney failure, unspecified: Secondary | ICD-10-CM | POA: Diagnosis not present

## 2018-08-10 LAB — CBC WITH DIFFERENTIAL/PLATELET
Abs Immature Granulocytes: 0.02 10*3/uL (ref 0.00–0.07)
Basophils Absolute: 0 10*3/uL (ref 0.0–0.1)
Basophils Relative: 0 %
Eosinophils Absolute: 0 10*3/uL (ref 0.0–0.5)
Eosinophils Relative: 0 %
HCT: 28.9 % — ABNORMAL LOW (ref 39.0–52.0)
Hemoglobin: 8.7 g/dL — ABNORMAL LOW (ref 13.0–17.0)
Immature Granulocytes: 0 %
Lymphocytes Relative: 8 %
Lymphs Abs: 0.5 10*3/uL — ABNORMAL LOW (ref 0.7–4.0)
MCH: 21.1 pg — ABNORMAL LOW (ref 26.0–34.0)
MCHC: 30.1 g/dL (ref 30.0–36.0)
MCV: 70 fL — ABNORMAL LOW (ref 80.0–100.0)
Monocytes Absolute: 0.2 10*3/uL (ref 0.1–1.0)
Monocytes Relative: 4 %
Neutro Abs: 5.7 10*3/uL (ref 1.7–7.7)
Neutrophils Relative %: 88 %
Platelets: 164 10*3/uL (ref 150–400)
RBC: 4.13 MIL/uL — ABNORMAL LOW (ref 4.22–5.81)
RDW: 22.6 % — ABNORMAL HIGH (ref 11.5–15.5)
WBC: 6.4 10*3/uL (ref 4.0–10.5)
nRBC: 0 % (ref 0.0–0.2)

## 2018-08-10 LAB — BASIC METABOLIC PANEL
Anion gap: 10 (ref 5–15)
BUN: 35 mg/dL — ABNORMAL HIGH (ref 8–23)
CO2: 20 mmol/L — ABNORMAL LOW (ref 22–32)
Calcium: 10 mg/dL (ref 8.9–10.3)
Chloride: 108 mmol/L (ref 98–111)
Creatinine, Ser: 2 mg/dL — ABNORMAL HIGH (ref 0.61–1.24)
GFR calc Af Amer: 35 mL/min — ABNORMAL LOW (ref >60)
GFR calc non Af Amer: 30 mL/min — ABNORMAL LOW (ref >60)
Glucose, Bld: 170 mg/dL — ABNORMAL HIGH (ref 70–99)
Potassium: 4.2 mmol/L (ref 3.5–5.1)
Sodium: 138 mmol/L (ref 135–145)

## 2018-08-10 MED ORDER — FERROUS SULFATE 325 (65 FE) MG PO TABS: 325.0000 mg | ORAL_TABLET | Freq: Every day | ORAL | 3 refills | Status: AC

## 2018-08-10 MED ORDER — B COMPLEX VITAMINS PO CAPS: 1.0000 | ORAL_CAPSULE | Freq: Every day | ORAL | Status: DC

## 2018-08-10 NOTE — Assessment & Plan Note (Signed)
To establish with nephrology next week Discussed the importance of seeing the nephrologist Likely contributing to some of his anemia

## 2018-08-10 NOTE — Progress Notes (Signed)
Patient on RA oxygen saturation 96%. Ambulated patient in hallway on RA, saturation remained above 94%. Patient reports that he is feeling good.  Will continue to monitor.  Layla Gramm, RN

## 2018-08-10 NOTE — Assessment & Plan Note (Signed)
Likely multifactorial - has low iron, CKD, chronic disease Taking one iron daily - continue Will see nephrology next week hemoccult neg in the fall

## 2018-08-10 NOTE — Assessment & Plan Note (Signed)
Uses cpap for about 4 hrs at night, occasionally longer Stressed importance of using the cpap machine as long as possible

## 2018-08-10 NOTE — Progress Notes (Signed)
Pt refusing bed alarm.

## 2018-08-10 NOTE — Progress Notes (Signed)
PROGRESS NOTE    [name]  OYD:[id number] DOB: 1934/02/20 DOA: 08/09/2018 PCP: Binnie Rail, MD    Brief Narrative:  Patient with history of PVD, obstructive sleep apnea, coronary artery disease, chronic systolic heart failure status post AICD, A. fib on Xarelto, aortic stenosis status post TAVR, chronic kidney disease stage III/IV with baseline creatinine for about 1.7 presenting to the emergency room with about 1 week of feeling weak and short of breath with minimal exertion.  No weight gain or extremity edema.  Not on home oxygen.  He wears CPAP but he has hard time tolerating it.  In the emergency room he was hypoxic.  Needing 4 L of oxygen.  Chest x-ray with interstitial edema.   Assessment & Plan:   Principal Problem:   Acute on chronic systolic CHF (congestive heart failure) (HCC) Active Problems:   Atrial fibrillation (HCC)   Hypercholesteremia   OSA (obstructive sleep apnea)   Essential hypertension   CKD (chronic kidney disease) stage 3, GFR 30-59 ml/min (HCC)   CHF (congestive heart failure) (HCC)  Acute on chronic systolic congestive heart failure with acute hypoxic respiratory failure: Continue IV Lasix Repeat echocardiogram with depressed ejection fraction. Intake output monitoring.  Continue telemetry observation.  Close monitoring of renal functions. Already on Xarelto, added aspirin.  Not on ACE inhibitor because of worsening renal functions. Patient on beta-blockers that he will continue.  Holding Norvasc. Because of significant findings and worsening ejection fraction, I have discussed case with cardiology and they will see him in consultation.  Acute kidney injury on chronic kidney disease stage III:  Creatinine is 2.  Expect improvement with IV diuresis.  Will closely monitor.  Chronic atrial fibrillation: Rate controlled.   He is on beta-blockers.  Therapeutic on Xarelto.  Hypertension:  Blood pressures are stable.  Obstructive sleep apnea:  Uses  CPAP intermittently whenever he can tolerate it.  Patient still has significant symptoms.  He is needing IV diuresis and close monitoring of his renal functions and urine output.  Patient will need hospital stay anticipate more than 2 midnights.  Will change to inpatient.   DVT prophylaxis: Xarelto Code Status: Full code Family Communication: None.  Patient will talk to his wife. Disposition Plan: Inpatient.   Consultants:   Cardiology consult called.  Procedures:   None  Antimicrobials:   None   Subjective: Patient was seen and examined.  He is still on 4 L of oxygen.  Feels short of breath on ambulation.  Objective: Vitals:   08/10/18 0331 08/10/18 0446 08/10/18 0935 08/10/18 0941  BP:  116/60 119/63   Pulse:  66 68   Resp:  18 17   Temp:  97.9 F (36.6 C) 98.3 F (36.8 C)   TempSrc:  Oral Oral   SpO2: 94% 94% 97% 96%  Weight:      Height:        Intake/Output Summary (Last 24 hours) at 08/10/2018 1117 Last data filed at 08/10/2018 0935 Gross per 24 hour  Intake 963 ml  Output 1550 ml  Net -587 ml   Filed Weights   08/09/18 0349 08/09/18 1005 08/10/18 0311  Weight: 69 kg 72 kg 71.5 kg    Examination:  General exam: Appears calm and comfortable  Respiratory system: Clear to auscultation. Respiratory effort normal. Cardiovascular system: S1 & S2 heard, irregularly irregular . No JVD, murmurs, rubs, gallops or clicks.  Trace pedal edema.  AICD in place. Gastrointestinal system: Abdomen is nondistended, soft and nontender.  No organomegaly or masses felt. Normal bowel sounds heard. Central nervous system: Alert and oriented. No focal neurological deficits. Extremities: Symmetric 5 x 5 power. Skin: No rashes, lesions or ulcers Psychiatry: Judgement and insight appear normal. Mood & affect appropriate.     Data Reviewed: I have personally reviewed following labs and imaging studies  CBC: Recent Labs  Lab 08/09/18 0412 08/10/18 0028  WBC 9.8 6.4   NEUTROABS 7.2 5.7  HGB 9.2* 8.7*  HCT 31.5* 28.9*  MCV 72.2* 70.0*  PLT 210 945   Basic Metabolic Panel: Recent Labs  Lab 08/09/18 0652 08/10/18 0028  NA 136 138  K 4.8 4.2  CL 108 108  CO2 18* 20*  GLUCOSE 125* 170*  BUN 31* 35*  CREATININE 1.69* 2.00*  CALCIUM 10.1 10.0   GFR: Estimated Creatinine Clearance: 25.9 mL/min (A) (by C-G formula based on SCr of 2 mg/dL (H)). Liver Function Tests: Recent Labs  Lab 08/09/18 0652  AST 22  ALT 23  ALKPHOS 53  BILITOT 0.9  PROT 7.0  ALBUMIN 4.0   No results for input(s): LIPASE, AMYLASE in the last 168 hours. No results for input(s): AMMONIA in the last 168 hours. Coagulation Profile: No results for input(s): INR, PROTIME in the last 168 hours. Cardiac Enzymes: Recent Labs  Lab 08/09/18 0652  TROPONINI <0.03   BNP (last 3 results) No results for input(s): PROBNP in the last 8760 hours. HbA1C: No results for input(s): HGBA1C in the last 72 hours. CBG: No results for input(s): GLUCAP in the last 168 hours. Lipid Profile: No results for input(s): CHOL, HDL, LDLCALC, TRIG, CHOLHDL, LDLDIRECT in the last 72 hours. Thyroid Function Tests: No results for input(s): TSH, T4TOTAL, FREET4, T3FREE, THYROIDAB in the last 72 hours. Anemia Panel: No results for input(s): VITAMINB12, FOLATE, FERRITIN, TIBC, IRON, RETICCTPCT in the last 72 hours. Sepsis Labs: No results for input(s): PROCALCITON, LATICACIDVEN in the last 168 hours.  Recent Results (from the past 240 hour(s))  SARS Coronavirus 2 [facility] order, Performed in [place] hospital lab)     Status: None   Collection Time: 08/01/18  7:19 PM  Result Value Ref Range Status   SARS Coronavirus 2 NEGATIVE NEGATIVE Final    Comment: (NOTE) If result is NEGATIVE SARS-CoV-2 target nucleic acids are NOT DETECTED. The SARS-CoV-2 RNA is generally detectable in upper and lower  respiratory specimens during the acute phase of infection. The lowest  concentration of  SARS-CoV-2 viral copies this assay can detect is 250  copies / mL. A negative result does not preclude SARS-CoV-2 infection  and should not be used as the sole basis for treatment or other  patient management decisions.  A negative result may occur with  improper specimen collection / handling, submission of specimen other  than nasopharyngeal swab, presence of viral mutation(s) within the  areas targeted by this assay, and inadequate number of viral copies  (<250 copies / mL). A negative result must be combined with clinical  observations, patient history, and epidemiological information. If result is POSITIVE SARS-CoV-2 target nucleic acids are DETECTED. The SARS-CoV-2 RNA is generally detectable in upper and lower  respiratory specimens dur ing the acute phase of infection.  Positive  results are indicative of active infection with SARS-CoV-2.  Clinical  correlation with patient history and other diagnostic information is  necessary to determine patient infection status.  Positive results do  not rule out bacterial infection or co-infection with other viruses. If result is PRESUMPTIVE POSTIVE SARS-CoV-2 nucleic  acids MAY BE PRESENT.   A presumptive positive result was obtained on the submitted specimen  and confirmed on repeat testing.  While 2019 novel coronavirus  (SARS-CoV-2) nucleic acids may be present in the submitted sample  additional confirmatory testing may be necessary for epidemiological  and / or clinical management purposes  to differentiate between  SARS-CoV-2 and other Sarbecovirus currently known to infect humans.  If clinically indicated additional testing with an alternate test  methodology [phone]) is advised. The SARS-CoV-2 RNA is generally  detectable in upper and lower respiratory sp ecimens during the acute  phase of infection. The expected result is Negative. Fact Sheet for Patients:  StrictlyIdeas.no Fact Sheet for Healthcare  Providers: BankingDealers.co.za This test is not yet approved or cleared by the [place] FDA and has been authorized for detection and/or diagnosis of SARS-CoV-2 by FDA under an Emergency Use Authorization (EUA).  This EUA will remain in effect (meaning this test can be used) for the duration of the COVID-19 declaration under Section 564(b)(1) of the Act, 21 U.S.C. section 360bbb-3(b)(1), unless the authorization is terminated or revoked sooner. Performed at [facility], [place] [address], [place],  [address]   SARS Coronavirus 2 (CEPHEID- Performed in [place] hospital lab), Hosp Order     Status: None   Collection Time: 08/09/18  3:49 AM  Result Value Ref Range Status   SARS Coronavirus 2 NEGATIVE NEGATIVE Final    Comment: (NOTE) If result is NEGATIVE SARS-CoV-2 target nucleic acids are NOT DETECTED. The SARS-CoV-2 RNA is generally detectable in upper and lower  respiratory specimens during the acute phase of infection. The lowest  concentration of SARS-CoV-2 viral copies this assay can detect is 250  copies / mL. A negative result does not preclude SARS-CoV-2 infection  and should not be used as the sole basis for treatment or other  patient management decisions.  A negative result may occur with  improper specimen collection / handling, submission of specimen other  than nasopharyngeal swab, presence of viral mutation(s) within the  areas targeted by this assay, and inadequate number of viral copies  (<250 copies / mL). A negative result must be combined with clinical  observations, patient history, and epidemiological information. If result is POSITIVE SARS-CoV-2 target nucleic acids are DETECTED. The SARS-CoV-2 RNA is generally detectable in upper and lower  respiratory specimens dur ing the acute phase of infection.  Positive  results are indicative of active infection with SARS-CoV-2.  Clinical  correlation with patient  history and other diagnostic information is  necessary to determine patient infection status.  Positive results do  not rule out bacterial infection or co-infection with other viruses. If result is PRESUMPTIVE POSTIVE SARS-CoV-2 nucleic acids MAY BE PRESENT.   A presumptive positive result was obtained on the submitted specimen  and confirmed on repeat testing.  While 2019 novel coronavirus  (SARS-CoV-2) nucleic acids may be present in the submitted sample  additional confirmatory testing may be necessary for epidemiological  and / or clinical management purposes  to differentiate between  SARS-CoV-2 and other Sarbecovirus currently known to infect humans.  If clinically indicated additional testing with an alternate test  methodology [phone]) is advised. The SARS-CoV-2 RNA is generally  detectable in upper and lower respiratory sp ecimens during the acute  phase of infection. The expected result is Negative. Fact Sheet for Patients:  StrictlyIdeas.no Fact Sheet for Healthcare Providers: BankingDealers.co.za This test is not yet approved or cleared by the [place]  FDA and has been authorized for detection and/or diagnosis of SARS-CoV-2 by FDA under an Emergency Use Authorization (EUA).  This EUA will remain in effect (meaning this test can be used) for the duration of the COVID-19 declaration under Section 564(b)(1) of the Act, 21 U.S.C. section 360bbb-3(b)(1), unless the authorization is terminated or revoked sooner. Performed at [facility], [place] [address] [place], [place] [address]          Radiology Studies: Dg Chest Portable 1 View  Result Date: 08/09/2018 CLINICAL DATA:  Worsening shortness of breath over the past week. EXAM: PORTABLE CHEST 1 VIEW COMPARISON:  Chest x-ray dated August 01, 2018. FINDINGS: Unchanged left chest wall pacemaker. Stable cardiomegaly status post CABG and TAVR. New diffusely increased  interstitial markings. New small bilateral pleural effusions and left greater than right bibasilar atelectasis. No pneumothorax. No acute osseous abnormality. IMPRESSION: 1. New mild interstitial pulmonary edema and small bilateral pleural effusions, consistent with congestive heart failure. Electronically Signed   By: Titus Dubin M.D.   On: 08/09/2018 04:46        Scheduled Meds: . amiodarone  200 mg Oral Daily  . aspirin EC  81 mg Oral Daily  . carvedilol  6.25 mg Oral BID WC  . darifenacin  7.5 mg Oral Daily  . furosemide  40 mg Intravenous Q12H  . Rivaroxaban  15 mg Oral Q supper  . rosuvastatin  40 mg Oral QPM  . sodium chloride flush  3 mL Intravenous Q12H   Continuous Infusions: . sodium chloride       LOS: 0 days    Time spent: 25 minutes    Barb Merino, MD [facility] Pager [phone]  If 7PM-7AM, please contact night-coverage [email or web address] Password [email or web address] 08/10/2018, 11:17 AM

## 2018-08-10 NOTE — Care Management Obs Status (Signed)
[place] NOTIFICATION   Patient Details  Name: [name] MRN: [id number] Date of Birth: [birth year]   Medicare Observation Status Notification Given:  Yes    Claudie Leach, RN 08/10/2018, 8:49 AM

## 2018-08-10 NOTE — Assessment & Plan Note (Signed)
Related to RLS, also sleeps a lot during the day Taking iron daily which may help RLS Takes diazepam prn

## 2018-08-10 NOTE — Assessment & Plan Note (Signed)
BP Has been well controlled Continue current medications

## 2018-08-10 NOTE — Discharge Instructions (Addendum)
YOUR CARDIOLOGY TEAM HAS ARRANGED FOR AN E-VISIT FOR YOUR APPOINTMENT - PLEASE REVIEW IMPORTANT INFORMATION BELOW SEVERAL DAYS PRIOR TO YOUR APPOINTMENT  Due to the recent COVID-19 pandemic, we are transitioning in-person office visits to tele-medicine visits in an effort to decrease unnecessary exposure to our patients, their families, and staff. These visits are billed to your insurance just like a normal visit is. We also encourage you to sign up for MyChart if you have not already done so. You will need a smartphone if possible. For patients that do not have this, we can still complete the visit using a regular telephone but do prefer a smartphone to enable video when possible. You may have a family member that lives with you that can help. If possible, we also ask that you have a blood pressure cuff and scale at home to measure your blood pressure, heart rate and weight prior to your scheduled appointment. Patients with clinical needs that need an in-person evaluation and testing will still be able to come to the office if absolutely necessary. If you have any questions, feel free to call our office.     YOUR PROVIDER WILL BE USING THE FOLLOWING PLATFORM TO COMPLETE YOUR VISIT: [email or web address]   IF USING MYCHART - How to Download the MyChart App to Your SmartPhone   - If Apple, go to [employer] and type in MyChart in the search bar and download the app. If Android, ask patient to go to [employer] and type in [place] in the search bar and download the app. The app is free but as with any other app downloads, your phone may require you to verify saved payment information or Apple/Android password.  - You will need to then log into the app with your MyChart username and password, and select [facility] as your healthcare provider to link the account.  - When it is time for your visit, go to the MyChart app, find appointments, and click Begin Video Visit. Be sure to Select Allow for your device to  access the Microphone and Camera for your visit. You will then be connected, and your provider will be with you shortly.  **If you have any issues connecting or need assistance, please contact MyChart service desk [phone])**  **If using a computer, in order to ensure the best quality for your visit, you will need to use either of the following Internet Browsers: Insurance underwriter or Microsoft Edge**   IF USING DOXIMITY or [email or web address] - The staff will give you instructions on receiving your link to join the meeting the day of your visit.      2-3 DAYS BEFORE YOUR APPOINTMENT  You will receive a telephone call from one of our [facility] team members - your caller ID may say "Unknown caller." If this is a video visit, we will walk you through how to get the video launched on your phone. We will remind you check your blood pressure, heart rate and weight prior to your scheduled appointment. If you have an Apple Watch or Kardia, please upload any pertinent ECG strips the day before or morning of your appointment to [place]. Our staff will also make sure you have reviewed the consent and agree to move forward with your scheduled tele-health visit.     THE DAY OF YOUR APPOINTMENT  Approximately 15 minutes prior to your scheduled appointment, you will receive a telephone call from one of [facility] team - your caller ID may say "Unknown caller."  Our staff will confirm medications, vital signs for the day and any symptoms you may be experiencing. Please have this information available prior to the time of visit start. It may also be helpful for you to have a pad of paper and pen handy for any instructions given during your visit. They will also walk you through joining the smartphone meeting if this is a video visit.    CONSENT FOR TELE-HEALTH VISIT - PLEASE REVIEW  I hereby voluntarily request, consent and authorize [facility] and its employed or contracted physicians, physician  assistants, nurse practitioners or other licensed health care professionals (the Practitioner), to provide me with telemedicine health care services (the Services") as deemed necessary by the treating Practitioner. I acknowledge and consent to receive the Services by the Practitioner via telemedicine. I understand that the telemedicine visit will involve communicating with the Practitioner through live audiovisual communication technology and the disclosure of certain medical information by electronic transmission. I acknowledge that I have been given the opportunity to request an in-person assessment or other available alternative prior to the telemedicine visit and am voluntarily participating in the telemedicine visit.  I understand that I have the right to withhold or withdraw my consent to the use of telemedicine in the course of my care at any time, without affecting my right to future care or treatment, and that the Practitioner or I may terminate the telemedicine visit at any time. I understand that I have the right to inspect all information obtained and/or recorded in the course of the telemedicine visit and may receive copies of available information for a reasonable fee.  I understand that some of the potential risks of receiving the Services via telemedicine include:   Delay or interruption in medical evaluation due to technological equipment failure or disruption;  Information transmitted may not be sufficient (e.g. poor resolution of images) to allow for appropriate medical decision making by the Practitioner; and/or   In rare instances, security protocols could fail, causing a breach of personal health information.  Furthermore, I acknowledge that it is my responsibility to provide information about my medical history, conditions and care that is complete and accurate to the best of my ability. I acknowledge that Practitioner's advice, recommendations, and/or decision may be based on  factors not within their control, such as incomplete or inaccurate data provided by me or distortions of diagnostic images or specimens that may result from electronic transmissions. I understand that the practice of medicine is not an exact science and that Practitioner makes no warranties or guarantees regarding treatment outcomes. I acknowledge that I will receive a copy of this consent concurrently upon execution via email to the email address I last provided but may also request a printed copy by calling the office of [facility].    I understand that my insurance will be billed for this visit.   I have read or had this consent read to me.  I understand the contents of this consent, which adequately explains the benefits and risks of the Services being provided via telemedicine.   I have been provided ample opportunity to ask questions regarding this consent and the Services and have had my questions answered to my satisfaction.  I give my informed consent for the services to be provided through the use of telemedicine in my medical care  By participating in this telemedicine visit I agree to the above.     Information on my medicine - XARELTO (Rivaroxaban)  Why was Xarelto  prescribed for you? Xarelto was prescribed for you to reduce the risk of a blood clot forming that can cause a stroke if you have a medical condition called atrial fibrillation (a type of irregular heartbeat).  What do you need to know about xarelto ? Take your Xarelto ONCE DAILY at the same time every day with your evening meal. If you have difficulty swallowing the tablet whole, you may crush it and mix in applesauce just prior to taking your dose.  Take Xarelto exactly as prescribed by your doctor and DO NOT stop taking Xarelto without talking to the doctor who prescribed the medication.  Stopping without other stroke prevention medication to take the place of Xarelto may increase your risk of  developing a clot that causes a stroke.  Refill your prescription before you run out.  After discharge, you should have regular check-up appointments with your healthcare provider that is prescribing your Xarelto.  In the future your dose may need to be changed if your kidney function or weight changes by a significant amount.  What do you do if you miss a dose? If you are taking Xarelto ONCE DAILY and you miss a dose, take it as soon as you remember on the same day then continue your regularly scheduled once daily regimen the next day. Do not take two doses of Xarelto at the same time or on the same day.   Important Safety Information A possible side effect of Xarelto is bleeding. You should call your healthcare provider right away if you experience any of the following: ? Bleeding from an injury or your nose that does not stop. ? Unusual colored urine (red or dark brown) or unusual colored stools (red or black). ? Unusual bruising for unknown reasons. ? A serious fall or if you hit your head (even if there is no bleeding).  Some medicines may interact with Xarelto and might increase your risk of bleeding while on Xarelto. To help avoid this, consult your healthcare provider or pharmacist prior to using any new prescription or non-prescription medications, including herbals, vitamins, non-steroidal anti-inflammatory drugs (NSAIDs) and supplements.  This website has more information on Xarelto: [email or web address]

## 2018-08-10 NOTE — Consult Note (Signed)
Cardiology Consultation:   Patient ID: [name] MRN: [id number]; DOB: Sep 24, 1933  Admit date: 08/09/2018 Date of Consult: 08/10/2018  Primary Care Provider: Binnie Rail, MD Primary Cardiologist: Lauree Chandler, MD  Primary Electrophysiologist:  Virl Axe, MD   Patient Profile:   [name] is a 85 y.o. male with a hx of persistent atrial fibrillation, CAD s/p CABG 1982 with redo 1994, ischemic cardiomyopathy with improved EF, history out of the hospital cardiac arrest in 2011 with subsequent Medtronic ICD implantation, aortic stenosis s/p TAVR, hypertension, hyperlipidemia and obstructive sleep apnea who is being seen today for the evaluation of dyspnea at the request of Dr. Sloan Leiter.  History of Present Illness:   [name] is a is a pleasant 85 year old male with past medical history of persistent atrial fibrillation, CAD s/p CABG 1982 with redo 1994, ischemic cardiomyopathy with improved EF, history out of the hospital cardiac arrest in 2011 with subsequent Medtronic ICD implantation, aortic stenosis s/p TAVR, hypertension, hyperlipidemia and obstructive sleep apnea.  Cardiac catheterization in October 2015 prior to the TAVR procedure revealed a occluded left main and RCA, patent LIMA to LAD, patent SVG to OM, patent SVG to RCA.  He subsequently underwent TAVR on 03/23/2014.  He was admitted in October 2019 with atrial flutter with RVR.  He converted to sinus rhythm on amiodarone drip.  Troponin was elevated.  He underwent another cardiac catheterization on 01/22/2018 which showed stable coronary anatomy.  Elevated troponin likely related to demand ischemia.  He underwent another cardioversion back to sinus rhythm in November 2019.  Last echocardiogram obtained on 02/10/2018 showed EF 40 to 45%, hypokinesis of the inferolateral myocardium.  Patient was last seen by Dr. Angelena Form February 2020 at which time he was doing well.  His weight at that time was 72.5 kg.  Last ICD  interrogation was performed in March or 2020, this revealed a normal device function, since January 2020, his A. fib burden has been around 31%.  Patient was seen in the ED in April 2020 with malaise, fatigue and loss of taste for 1 week.  COVID-19 test came back negative.  He also noted to have acute on chronic renal insufficiency.  He was referred to the nephrology service and is scheduled to see nephrology as outpatient.  Unfortunately, prior to his nephrology evaluation, he came back to the hospital on 08/09/2018 with shortness of breath.  Patient says he ate breakfast yesterday morning, while taking the trash out, he walked about 125 feet to the trash can.  On his way back he started having significant shortness of breath and had to rest.  He says he never has this type of shortness of breath with this short distance of walking before.  This prompted him to seek medical attention.  He denies any recent exertional chest discomfort, lower extremity edema, orthopnea or PND.  Significant lab work on arrival shows creatinine of 1.69 which is close to his baseline.  BNP 771.  Hemoglobin 9.2 also near his baseline.  Troponin was negative.  Chest x-ray concerning for developing pulmonary edema.  EKG showed paced rhythm with a heart rate of 60.  Echocardiogram was performed during this hospitalization on 08/09/2018, initial interpretation was EF 30 to 35%, however this was corrected later by Dr. Harrington Challenger who says EF is around 50 to 55% with apical hypokinesis however the study had very poor acoustic window, severe LAE, no significant valvular issue.  TAVR seems to be stable by echocardiogram.  Patient underwent IV  diuresis, he is net -700 mL since admission.  Urinary output has not been very significant on the IV diuresis.  Unfortunately his creatinine has trended up from 1.7-2.0.  Cardiology was consulted for acute on chronic diastolic heart failure.  Past Medical History:  Diagnosis Date   Aortic stenosis, severe     s/p TAVR with Edwards Sapien 3 THV (size 26 mm, model # 9600TFX, serial # [id number])   Arthritis    Cardiac arrest (HCC)    Chronic systolic CHF (congestive heart failure) ([place]) 01/21/2018   Coronary artery disease    NSTEMI 10/19   Essential hypertension    Headache    History of hiatal hernia    Hyperlipidemia    Kidney stones    OSA (obstructive sleep apnea) 02/05/2018   Severe obstructive sleep apnea with an AHI of 36.8/h with no significant central sleep apnea.   Now on CPAP at  5 cm H2O   Peripheral vascular disease ([place])    S/P cardiac cath 01/22/18  01/23/2018    Past Surgical History:  Procedure Laterality Date   ADENOIDECTOMY     APPENDECTOMY     CARDIAC CATHETERIZATION   11/16/2009    CARDIAC DEFIBRILLATOR PLACEMENT     CARDIOVERSION N/A 02/24/2018   Procedure: CARDIOVERSION;  Surgeon: Sueanne Margarita, MD;  Location: [facility];  Service: Cardiovascular;  Laterality: N/A;   cataract surgery      Bilateral cataract surgery   CORONARY ARTERY BYPASS GRAFT     in 1982 and '84 with subsequent PTCI   EP IMPLANTABLE DEVICE N/A 03/21/2016   Procedure: [place];  Surgeon: Deboraha Sprang, MD;  Location: [facility];  Service: Cardiovascular;  Laterality: N/A;   HERNIA REPAIR     LEFT AND RIGHT HEART CATHETERIZATION WITH CORONARY/GRAFT ANGIOGRAM N/A 02/01/2014   Procedure: LEFT AND RIGHT HEART CATHETERIZATION WITH Beatrix Fetters;  Surgeon: Sinclair Grooms, MD;  Location: [facility];  Service: Cardiovascular;  Laterality: N/A;   left elbow tendon surgery     LEFT HEART CATH AND CORS/GRAFTS ANGIOGRAPHY N/A 01/22/2018   Procedure: LEFT HEART CATH AND CORS/GRAFTS ANGIOGRAPHY;  Surgeon: Leonie Man, MD;  Location: [facility];  Service: Cardiovascular;  Laterality: N/A;   TEE WITHOUT CARDIOVERSION N/A 03/23/2014   Procedure: TRANSESOPHAGEAL ECHOCARDIOGRAM (TEE);  Surgeon: Burnell Blanks, MD;  Location: [facility];  Service: Open Heart Surgery;  Laterality: N/A;   TONSILLECTOMY     TRANSCATHETER AORTIC VALVE REPLACEMENT, TRANSFEMORAL N/A 03/23/2014   Procedure: TRANSCATHETER AORTIC VALVE REPLACEMENT, TRANSFEMORAL;  Surgeon: Burnell Blanks, MD;  Location: [facility];  Service: Open Heart Surgery;  Laterality: N/A;     Home Medications:  Prior to Admission medications   Medication Sig Start Date End Date Taking? Authorizing Provider  amiodarone (PACERONE) 200 MG tablet Take 1 tablet (200 mg total) by mouth daily. 02/22/18  Yes Simmons, Brittainy M, PA-C  amLODipine (NORVASC) 5 MG tablet TAKE 1 TABLET BY MOUTH DAILY. Patient taking differently: Take 5 mg by mouth daily.  12/23/17  Yes Burnell Blanks, MD  Calcium Carbonate Antacid (TUMS PO) Take 2 tablets by mouth 2 (two) times daily as needed (indigestion/ acid reflux).   Yes [provider]  Coenzyme Q10-Vitamin E (QUNOL ULTRA COQ10 PO) Take 1 Dose by mouth at bedtime.    Yes [provider]  diazepam (VALIUM) 5 MG tablet TAKE 1 TABLET BY MOUTH ONCE DAILY AS NEEDED Patient taking differently: Take  5 mg by mouth at bedtime as needed (Sleep).  05/19/18  Yes Burnell Blanks, MD  furosemide (LASIX) 40 MG tablet TAKE 1 TABLET (40 MG TOTAL) BY MOUTH DAILY. 07/31/18  Yes Burnell Blanks, MD  ketotifen (ZADITOR) 0.025 % ophthalmic solution Place 1 drop into both eyes daily as needed (seasonal allergies).   Yes [provider]  KRILL OIL PO Take 1 capsule by mouth at bedtime.    Yes [provider]  Magnesium Oxide (MAG-OXIDE PO) Take 1 tablet by mouth at bedtime.    Yes [provider]  Melatonin 1 MG TABS Take 3 mg by mouth at bedtime as needed (sleep).    Yes [provider]  Multiple Vitamin (MULTIVITAMIN WITH MINERALS) TABS tablet Take 1 tablet by mouth daily.   Yes [provider]  nitroGLYCERIN (NITROSTAT) 0.4 MG SL tablet Place 1 tablet (0.4 mg total) under the  tongue every 5 (five) minutes as needed for chest pain (for chest pain (MAX of 3 doses)). 04/12/15  Yes Burnell Blanks, MD  Potassium 99 MG TABS Take 99 mg by mouth 2 (two) times daily.   Yes [provider]  Rivaroxaban (XARELTO) 15 MG TABS tablet Take 1 tablet (15 mg total) by mouth daily with supper. 02/13/18  Yes Burnell Blanks, MD  rosuvastatin (CRESTOR) 40 MG tablet TAKE 1 TABLET BY MOUTH DAILY. Patient taking differently: Take 40 mg by mouth every evening.  12/23/17  Yes Burnell Blanks, MD  Thiamine HCl (VITAMIN B-1) 100 MG tablet Take 100 mg by mouth at bedtime.    Yes [provider]  trospium (SANCTURA) 20 MG tablet Take 20 mg by mouth 2 (two) times daily.   Yes [provider]  b complex vitamins capsule Take 1 capsule by mouth daily. 08/10/18   Binnie Rail, MD  ferrous sulfate 325 (65 FE) MG tablet Take 1 tablet (325 mg total) by mouth daily with breakfast. 08/10/18   Binnie Rail, MD    Inpatient Medications: Scheduled Meds:  amiodarone  200 mg Oral Daily   aspirin EC  81 mg Oral Daily   carvedilol  6.25 mg Oral BID WC   darifenacin  7.5 mg Oral Daily   furosemide  40 mg Intravenous Q12H   Rivaroxaban  15 mg Oral Q supper   rosuvastatin  40 mg Oral QPM   sodium chloride flush  3 mL Intravenous Q12H   Continuous Infusions:  sodium chloride     PRN Meds: sodium chloride, acetaminophen, diazepam, ondansetron (ZOFRAN) IV, sodium chloride flush  Allergies:    Allergies  Allergen Reactions   Cyclobenzaprine Hcl Other (See Comments)    Unknown reaction   Tetracycline Rash         Social History:   Social History   Socioeconomic History   Marital status: Married    Spouse name: Not on file   Number of children: 3   Years of education: Not on file   Highest education level: Not on file  Occupational History   Occupation: Retired  Scientist, product/process development strain: Not hard at [employer]  insecurity:    Worry: Never true    Inability: Never true   Transportation needs:    Medical: No    Non-medical: No  Tobacco Use   Smoking status: Former Smoker    Last attempt to quit: 10/25/1952    Years since quitting: 65.8   Smokeless tobacco: Never Used  Substance and Sexual Activity   Alcohol use: Yes    Alcohol/week: 5.0 - 6.0 standard drinks    Types: 5 - 6 Glasses of wine per week    Comment: 5-6 times a week   Drug use: No   Sexual activity: Never  Lifestyle   Physical activity:    Days per week: Not on file    Minutes per session: Not on file   Stress: Only a little  Relationships   Social connections:    Talks on phone: More than three times a week    Gets together: More than three times a week    Attends religious service: More than 4 times per year    Active member of club or organization: Not on file    Attends meetings of clubs or organizations: More than 4 times per year    Relationship status: Married   Intimate partner violence:    Fear of current or ex partner: No    Emotionally abused: No    Physically abused: No    Forced sexual activity: No  Other Topics Concern   Not on file  Social History Narrative   Not on file    Family History:    Family History  Problem Relation Age of Onset   Heart attack Father 18   Coronary artery disease Other        family history is dignificant for early CAD in several members     ROS:  Please see the history of present illness.   All other ROS reviewed and negative.     Physical Exam/Data:   Vitals:   08/10/18 0446 08/10/18 0935 08/10/18 0941 08/10/18 1147  BP: 116/60 119/63  (!) 130/55  Pulse: 66 68  63  Resp: 18 17  18   Temp: 97.9 F (36.6 C) 98.3 F (36.8 C)  98.1 F (36.7 C)  TempSrc: Oral Oral  Oral  SpO2: 94% 97% 96% 99%  Weight:      Height:        Intake/Output Summary (Last 24 hours) at 08/10/2018 1221 Last data filed at 08/10/2018 1149 Gross per 24 hour  Intake 963 ml    Output 1600 ml  Net -637 ml   Last 3 Weights 08/10/2018 08/09/2018 08/09/2018  Weight (lbs) 157 lb 10.1 oz 158 lb 11.7 oz 152 lb 1.9 oz  Weight (kg) 71.5 kg 72 kg 69 kg     Body mass index is 25.25 kg/m.  General:  Well nourished, well developed, in no acute distress HEENT: normal Lymph: no adenopathy Neck: no JVD Endocrine:  No thryomegaly Vascular: No carotid bruits; FA pulses 2+ bilaterally without bruits  Cardiac:  normal S1, S2; RRR; no murmur  Lungs: Diffusely diminished breath sounds without obvious crackles or wheezing Abd: soft, nontender, no hepatomegaly  Ext: no edema Musculoskeletal:  No deformities, BUE and BLE strength normal and equal Skin: warm and dry  Neuro:  CNs 2-12 intact, no focal abnormalities noted Psych:  Normal affect   EKG:  The EKG was personally reviewed and demonstrates: Paced rhythm Telemetry:  Telemetry was personally reviewed and demonstrates: Paced rhythm on telemetry, heart rate 60  Relevant CV Studies:  Cath 01/22/2018  Mid LM to Prox LAD lesion is 100% stenosed with 100% stenosed side branch in Ost Cx to Dist Cx. Known CTO  SVG-OM graft was visualized by angiography and is large. The graft exhibits minimal luminal irregularities.  Origin to Prox Graft stent has 5% in-stent restenosis. Mid  Graft to Dist Graft stent has 5% in-stent restenosis.  LIMA graft was visualized by angiography and is normal in caliber. The graft exhibits no disease. The graft insertion to the mid LAD.  Prox LAD to Mid LAD lesion is 90% stenosed -retrograde from LIMA insertion. (Stable).  Mid LAD to Dist LAD lesion is 90% stenosed-antegrade from LIMA insertion. (Stable).  Ost RCA to Dist RCA lesion is 100% stenosed. Known CTO  ---------------------------------------------  SVG-dRCA graft was visualized by angiography and is large. The graft exhibits minimal luminal irregularities.  Dist RCA lesion is 40% stenosed at the graft insertion  site.  ---------------------------------------------   Known severe native CAD with occluded RCA and left main. Patent SVG-d RCA, SVG-OM, LIMA-LAD. Chronic/stable severe disease with retrograde and antegrade from LIMA insertion.  No obvious culprit lesion to explain non-STEMI, suggest demand ischemia in the setting of A. fib RVR with LAD lesions leading to LAD ischemia.  Plan: Return to nursing unit for ongoing care.  Will restart IV heparin 8 hours after closure. Would continue current management per primary cardiology service.   Recommend to resume Rivaroxaban, at currently prescribed dose and frequency, on Evening of 01/23/2018.  Concurrent aspirin 81 mg.  Laboratory Data:  Chemistry Recent Labs  Lab 08/09/18 0652 08/10/18 0028  NA 136 138  K 4.8 4.2  CL 108 108  CO2 18* 20*  GLUCOSE 125* 170*  BUN 31* 35*  CREATININE 1.69* 2.00*  CALCIUM 10.1 10.0  GFRNONAA 37* 30*  GFRAA 43* 35*  ANIONGAP 10 10    Recent Labs  Lab 08/09/18 0652  PROT 7.0  ALBUMIN 4.0  AST 22  ALT 23  ALKPHOS 53  BILITOT 0.9   Hematology Recent Labs  Lab 08/09/18 0412 08/10/18 0028  WBC 9.8 6.4  RBC 4.36 4.13*  HGB 9.2* 8.7*  HCT 31.5* 28.9*  MCV 72.2* 70.0*  MCH 21.1* 21.1*  MCHC 29.2* 30.1  RDW 23.9* 22.6*  PLT 210 164   Cardiac Enzymes Recent Labs  Lab 08/09/18 0652  TROPONINI <0.03   No results for input(s): TROPIPOC in the last 168 hours.  BNP Recent Labs  Lab 08/09/18 0412  BNP 771.0*    DDimer No results for input(s): DDIMER in the last 168 hours.  Radiology/Studies:  Dg Chest Portable 1 View  Result Date: 08/09/2018 CLINICAL DATA:  Worsening shortness of breath over the past week. EXAM: PORTABLE CHEST 1 VIEW COMPARISON:  Chest x-ray dated August 01, 2018. FINDINGS: Unchanged left chest wall pacemaker. Stable cardiomegaly status post CABG and TAVR. New diffusely increased interstitial markings. New small bilateral pleural effusions and left greater than right  bibasilar atelectasis. No pneumothorax. No acute osseous abnormality. IMPRESSION: 1. New mild interstitial pulmonary edema and small bilateral pleural effusions, consistent with congestive heart failure. Electronically Signed   By: Titus Dubin M.D.   On: 08/09/2018 04:46    Assessment and Plan:   1. Acute on chronic diastolic heart failure: Although initial echocardiogram was interpreted EF 35 to 40%, this was later corrected by Dr. Harrington Challenger who says despite the poor cost window, EF is truly around 50 to 55% with apical hypokinesis.  On physical exam, he looks surprisingly euvolemic.  I did not see significant JVD.  There is no lower extremity edema.  He has diffusely diminished breath sound all over however no basilar crackles was appreciated.  It is unclear to me what caused him to have sudden onset of exertional dyspnea however since admission, he has been ambulating in  the hallway without significant shortness of breath.  He might be mildly volume overloaded on arrival, however I think at this point he is approaching his euvolemic level.  His weight today is 71.5 pounds which is lower than his baseline dry weight.  2. CAD s/p CABG in 1982 with redo in 1994: Most recent cardiac catheterization was performed on 01/22/2018 which showed stable coronary anatomy.  Patient denies any recent exertional chest pain.  Since EF is not really down, would not recommend any ischemic work-up at this time  3. Ischemic cardiomyopathy with improved EF: EF improved from 40% to 50%  4. History of cardiac arrest in 2011 s/p Medtronic ICD: Based on telemetry, it appears his ICD was set to pace at 60 bpm.  He has been having more fatigue recently, this is likely multifactorial related to his age, mild congestive heart failure and anemia.  Question if increase the pacing rate will improve his symptom however I suspect symptoms will improve with improvement of hemoglobin.  5. Aortic s/p post TAVR: Stable on recent  echocardiogram  6. Hypertension  7. Hyperlipidemia  8. Obstructive sleep apnea      For questions or updates, please contact [place] Please consult [email or web address] for contact info under     Hilbert Corrigan, [place]  08/10/2018 12:21 PM

## 2018-08-10 NOTE — TOC Initial Note (Addendum)
Transition of Care [facility]) - Initial/Assessment Note    Patient Details  Name: [name] MRN: [id number] Date of Birth: [birth year]  [facility] CM/SW Contact:    Claudie Leach, RN Phone Number: 08/10/2018, 9:03 AM  Clinical Narrative:                 Pt presented for shortness of breath increasing over the last week, with ED visit for same last week.  Pt lives with wife and both are independent with ADLs.  Pt and wife drive to pharmacy/appointments as necessary. Pt mows yard with riding mower and would like to play golf, but has been unable to due to SOB with exertion during last few months.  Pt has no DME at home other than CPAP and has never used home health. Pt is moderately compliant with CPAP, stating he usually takes it off during the night because it "irritates" him.  Pt states he weighs every morning with goal of keeping weight 150-155 lbs. Pt states his wife cooks to follow a low sodium diet for him and diabetic diet for herself.  There are no barriers to healthcare or medication regimen.  Pt would be open to RN visits if deemed necessary when he leaves the hospital. Pt is a candidate for [facility] and may do well with [facility] follow up.   Expected Discharge Plan: [place] Barriers to Discharge: No Barriers Identified   Patient Goals and CMS Choice Patient states their goals for this hospitalization and ongoing recovery are:: to be more active       Expected Discharge Plan and Services Expected Discharge Plan: [place]   Discharge Planning Services: CM Consult   Living arrangements for the past 2 months: Single Family Home Expected Discharge Date: 08/11/18                 Prior Living Arrangements/Services Living arrangements for the past 2 months: Single Family Home Lives with:: Spouse Patient language and need for interpreter reviewed:: Yes        Need for Family Participation in Patient Care: Yes (Comment) Care giver  support system in place?: Yes (comment)   Criminal Activity/Legal Involvement Pertinent to Current Situation/Hospitalization: No - Comment as needed  Activities of Daily Living Home Assistive Devices/Equipment: None ADL Screening (condition at time of admission) Patient's cognitive ability adequate to safely complete daily activities?: Yes Is the patient deaf or have difficulty hearing?: No Does the patient have difficulty seeing, even when wearing glasses/contacts?: No Does the patient have difficulty concentrating, remembering, or making decisions?: No Patient able to express need for assistance with ADLs?: Yes Does the patient have difficulty dressing or bathing?: No Independently performs ADLs?: Yes (appropriate for developmental age) Does the patient have difficulty walking or climbing stairs?: Yes Weakness of Legs: Both Weakness of Arms/Hands: None  Emotional Assessment Appearance:: Appears stated age Attitude/Demeanor/Rapport: Gracious, Engaged Affect (typically observed): Accepting, Adaptable, Hopeful Orientation: : Oriented to Self, Oriented to Place, Oriented to  Time, Oriented to Situation Alcohol / Substance Use: Not Applicable Psych Involvement: No (comment)  Admission diagnosis:  Hypoxia [R09.02] Patient Active Problem List   Diagnosis Date Noted  . Acute on chronic systolic CHF (congestive heart failure) ([place]) 08/09/2018  . Essential hypertension 08/09/2018  . CKD (chronic kidney disease) stage 3, GFR 30-59 ml/min (HCC) 08/09/2018  . AKI (acute kidney injury) ([place]) 03/03/2018  . Constipation 03/03/2018  . Anemia 03/03/2018  . OSA (obstructive sleep  apnea) 02/05/2018  . S/P cardiac cath 01/22/18 stable -treating medically  01/23/2018  . Non-ST elevation (NSTEMI) myocardial infarction ([place]) 01/21/2018  . Chronic systolic CHF (congestive heart failure) ([place]) 01/21/2018  . Sleep difficulties 07/29/2015  . Erectile disorder due to medical condition in male patient  12/17/2014  . Severe aortic valve stenosis - s/p  Edwards Sapien 3 THV (size 26 mm, model # 9600TFX, serial # [id number]) 03/23/2014 03/23/2014  . Abnormal nuclear stress test 01/29/2014  . Ischemic cardiomyopathy 01-08-2012  . Sudden cardiac death-aborted Nov 01, 2010  . Back pain 07/10/2010  . Hypercholesteremia 07/10/2010  . Benign hypertensive heart disease without heart failure 07/10/2010  . S/P CABG (coronary artery bypass graft) 1982 and re-do 1994  07/10/2010  . Implantable cardioverter-defibrillator (ICD) in situ 03/30/2010  . Atrial fibrillation ([place]) 2020/04/209   PCP:  Binnie Rail, MD Pharmacy:   [facility], [place] [address] Phone: [phone] Fax: [phone]  [id number] [place], [place] [address] Phone: [phone] Fax: [phone]     Social Determinants of Health (SDOH) Interventions    Readmission Risk Interventions No flowsheet data found.

## 2018-08-10 NOTE — Assessment & Plan Note (Signed)
Has chronic SOB - he says this is at his baseline Continue current medications

## 2018-08-11 DIAGNOSIS — I5043 Acute on chronic combined systolic (congestive) and diastolic (congestive) heart failure: Secondary | ICD-10-CM | POA: Diagnosis present

## 2018-08-11 DIAGNOSIS — I5033 Acute on chronic diastolic (congestive) heart failure: Secondary | ICD-10-CM

## 2018-08-11 LAB — BASIC METABOLIC PANEL
Anion gap: 9 (ref 5–15)
BUN: 43 mg/dL — ABNORMAL HIGH (ref 8–23)
CO2: 23 mmol/L (ref 22–32)
Calcium: 9.8 mg/dL (ref 8.9–10.3)
Chloride: 108 mmol/L (ref 98–111)
Creatinine, Ser: 1.96 mg/dL — ABNORMAL HIGH (ref 0.61–1.24)
GFR calc Af Amer: 36 mL/min — ABNORMAL LOW (ref >60)
GFR calc non Af Amer: 31 mL/min — ABNORMAL LOW (ref >60)
Glucose, Bld: 107 mg/dL — ABNORMAL HIGH (ref 70–99)
Potassium: 4 mmol/L (ref 3.5–5.1)
Sodium: 140 mmol/L (ref 135–145)

## 2018-08-11 MED ORDER — CARVEDILOL 6.25 MG PO TABS: 6.2500 mg | ORAL_TABLET | Freq: Two times a day (BID) | ORAL | 0 refills | Status: DC

## 2018-08-11 NOTE — Progress Notes (Signed)
Physical Therapy Treatment Patient Details Name: [name] MRN: [id number] DOB: 1933-05-07 Today's Date: 08/11/2018    History of Present Illness 85 y.o. male with medical history significant of PVD; OSA; CAD; HLD; HTN; chronic systolic CHF with AICD; afib on Xarelto; and AS s/p TAVR presenting with SOB.  CHF exacerbation     PT Comments    Pt very pleasant supine on arrival with HOB 35 degrees and sPO2 89% on RA. Pt with increased SPO2 with elevated trunk remaining 92-98% on RA during sitting, standing and gait. PT reports plan to walk 4 holes of golf course on return home for his walking program. PT with great improvement and safe for return home. PT with 1/4 DOE and able to maintain sats. HR 104-111    Follow Up Recommendations  No PT follow up;Supervision/Assistance - 24 hour     Equipment Recommendations  None recommended by PT    Recommendations for Other Services       Precautions / Restrictions Precautions Precautions: None    Mobility  Bed Mobility Overal bed mobility: Modified Independent             General bed mobility comments: use of bed rails to pull to EoB  Transfers Overall transfer level: Modified independent   Transfers: Sit to/from Stand              Ambulation/Gait Ambulation/Gait assistance: Modified independent (Device/Increase time) Gait Distance (Feet): 500 Feet Assistive device: None Gait Pattern/deviations: Step-through pattern;Decreased stride length   Gait velocity interpretation: >2.62 ft/sec, indicative of community ambulatory General Gait Details: pt with good stability with gait this session on RA, no AD and no need for cues or assist with increased gait tolerance   Stairs             Wheelchair Mobility    Modified Rankin (Stroke Patients Only)       Balance Overall balance assessment: Mild deficits observed, not formally tested                                          Cognition  Arousal/Alertness: Awake/alert Behavior During Therapy: WFL for tasks assessed/performed Overall Cognitive Status: Within Functional Limits for tasks assessed                                        Exercises      General Comments        Pertinent Vitals/Pain Pain Assessment: No/denies pain    Home Living                      Prior Function            PT Goals (current goals can now be found in the care plan section) Progress towards PT goals: Progressing toward goals    Frequency           PT Plan Current plan remains appropriate    Co-evaluation              AM-PAC PT "6 Clicks" Mobility   Outcome Measure  Help needed turning from your back to your side while in a flat bed without using bedrails?: None Help needed moving from lying on your back to sitting on the side of a flat bed without using  bedrails?: None Help needed moving to and from a bed to a chair (including a wheelchair)?: None Help needed standing up from a chair using your arms (e.g., wheelchair or bedside chair)?: None Help needed to walk in hospital room?: None Help needed climbing 3-5 steps with a railing? : A Little 6 Click Score: 23    End of Session Equipment Utilized During Treatment: Gait belt Activity Tolerance: Patient tolerated treatment well Patient left: in chair;with call bell/phone within reach Nurse Communication: Mobility status PT Visit Diagnosis: Unsteadiness on feet (R26.81);Muscle weakness (generalized) (M62.81);Difficulty in walking, not elsewhere classified (R26.2)     Time: 4709-6283 PT Time Calculation (min) (ACUTE ONLY): 15 min  Charges:  $Gait Training: 8-22 mins                     Asami Lambright Pam Drown, PT Acute Rehabilitation Services Pager: [phone] Office: [place] 08/11/2018, 12:29 PM

## 2018-08-11 NOTE — Discharge Summary (Signed)
Physician Discharge Summary  RAJAT STAVER IRJ:[id number] DOB: July 23, 1933 DOA: 08/09/2018  PCP: Binnie Rail, MD  Admit date: 08/09/2018 Discharge date: 08/11/2018  Admitted From: home  Disposition:  Home   Recommendations for Outpatient Follow-up:  1. Follow up with PCP in 1-2 weeks 2. Please obtain BMP/CBC in one week 3. Follow-up with your nephrology as scheduled.  Home Health: Not applicable Equipment/Devices: None  Discharge Condition: Stable CODE STATUS: Full code Diet recommendation: Low-salt  Brief/Interim Summary: Patient with history of PVD, obstructive sleep apnea, coronary artery disease, chronic systolic heart failure status post AICD, A. fib on Xarelto, aortic stenosis status post TAVR, chronic kidney disease stage III/IV with baseline creatinine for about 1.7 presenting to the emergency room with about 1 week of feeling weak and short of breath with minimal exertion.  No weight gain or extremity edema.  Not on home oxygen.  He wears CPAP but he has hard time tolerating it.  In the emergency room he was hypoxic.  Needing 4 L of oxygen.  Chest x-ray with interstitial edema.  Discharge Diagnoses:  Principal Problem:   Acute on chronic diastolic CHF (congestive heart failure) (HCC) Active Problems:   Atrial fibrillation (HCC)   Hypercholesteremia   OSA (obstructive sleep apnea)   Essential hypertension   CKD (chronic kidney disease) stage 3, GFR 30-59 ml/min (HCC)   CHF (congestive heart failure) (HCC)  Acute on chronic Diastolic congestive heart failure with acute hypoxic respiratory failure: Initially required 4 L of oxygen.  Treated with IV Lasix. Repeat echocardiogram with improvement of ejection fraction.  Corrected ejection fraction 55%. Good clinical improvement with IV diuresis.  Currently on room air and ambulating with no desaturation. Also seen by cardiology.  Recommended to continue treatment. On once a day dose of metoprolol, changed to carvedilol twice a  day. Continue amlodipine. Not on ACE inhibitor because of worsening renal functions.   Acute kidney injury on chronic kidney disease stage III: Baseline creatinine about 1.7-1.9. Fluctuating levels.  Will need long-term management.  He is a scheduled to see his nephrologist on 08/14/2018. Today creatinine is 1.96.  Chronic atrial fibrillation: Rate controlled.    On beta-blockers and amiodarone.  Also has biventricular AICD and pacemaker. Therapeutic on Xarelto.  Hypertension: Blood pressures are stable.  Obstructive sleep apnea: Uses CPAP intermittently whenever he can tolerate it  Patient is fairly stable.  He is on room air.  He is without symptoms.  Going home today.  Discharge Instructions  Discharge Instructions    Call MD for:  difficulty breathing, headache or visual disturbances   Complete by:  As directed    Call MD for:  persistant dizziness or light-headedness   Complete by:  As directed    Diet - low sodium heart healthy   Complete by:  As directed    Discharge instructions   Complete by:  As directed    Take all your medications, There is a new medicine called carvedilol, sent to your pharmacy   Increase activity slowly   Complete by:  As directed      Allergies as of 08/11/2018      Reactions   Cyclobenzaprine Hcl Other (See Comments)   Unknown reaction   Tetracycline Rash         Medication List    TAKE these medications   amiodarone 200 MG tablet Commonly known as:  PACERONE Take 1 tablet (200 mg total) by mouth daily.   amLODipine 5 MG tablet Commonly known as:  NORVASC TAKE 1 TABLET BY MOUTH DAILY.   b complex vitamins capsule Take 1 capsule by mouth daily.   carvedilol 6.25 MG tablet Commonly known as:  COREG Take 1 tablet (6.25 mg total) by mouth 2 (two) times daily with a meal for 30 days.   diazepam 5 MG tablet Commonly known as:  VALIUM TAKE 1 TABLET BY MOUTH ONCE DAILY AS NEEDED What changed:    when to take this  reasons to  take this   ferrous sulfate 325 (65 FE) MG tablet Take 1 tablet (325 mg total) by mouth daily with breakfast.   furosemide 40 MG tablet Commonly known as:  LASIX TAKE 1 TABLET (40 MG TOTAL) BY MOUTH DAILY.   ketotifen 0.025 % ophthalmic solution Commonly known as:  ZADITOR Place 1 drop into both eyes daily as needed (seasonal allergies).   KRILL OIL PO Take 1 capsule by mouth at bedtime.   MAG-OXIDE PO Take 1 tablet by mouth at bedtime.   Melatonin 1 MG Tabs Take 3 mg by mouth at bedtime as needed (sleep).   multivitamin with minerals Tabs tablet Take 1 tablet by mouth daily.   nitroGLYCERIN 0.4 MG SL tablet Commonly known as:  NITROSTAT Place 1 tablet (0.4 mg total) under the tongue every 5 (five) minutes as needed for chest pain (for chest pain (MAX of 3 doses)).   Potassium 99 MG Tabs Take 99 mg by mouth 2 (two) times daily.   QUNOL ULTRA COQ10 PO Take 1 Dose by mouth at bedtime.   Rivaroxaban 15 MG Tabs tablet Commonly known as:  XARELTO Take 1 tablet (15 mg total) by mouth daily with supper.   rosuvastatin 40 MG tablet Commonly known as:  CRESTOR TAKE 1 TABLET BY MOUTH DAILY. What changed:  when to take this   thiamine 100 MG tablet Commonly known as:  VITAMIN B-1 Take 100 mg by mouth at bedtime.   trospium 20 MG tablet Commonly known as:  SANCTURA Take 20 mg by mouth 2 (two) times daily.   TUMS PO Take 2 tablets by mouth 2 (two) times daily as needed (indigestion/ acid reflux).      Follow-up Information    Burnell Blanks, MD Follow up.   Specialty:  Cardiology Why:  office scheduler will contact you to arrange telehealth visit, please give [place] a call if you do not hear from [place] in 3 business days. Contact information: [address] [place] [address] [phone]        Burns, Stacy J, MD. Go on 08/18/2018.   Specialty:  Internal Medicine Why:  @1 :00pm will call your cell phone with instructions for TELE  VISIT Contact information: [address] [place] [address] [phone]          Allergies  Allergen Reactions  . Cyclobenzaprine Hcl Other (See Comments)    Unknown reaction  . Tetracycline Rash         Consultations:  Cardiology   Procedures/Studies: Dg Chest Portable 1 View  Result Date: 08/09/2018 CLINICAL DATA:  Worsening shortness of breath over the past week. EXAM: PORTABLE CHEST 1 VIEW COMPARISON:  Chest x-ray dated August 01, 2018. FINDINGS: Unchanged left chest wall pacemaker. Stable cardiomegaly status post CABG and TAVR. New diffusely increased interstitial markings. New small bilateral pleural effusions and left greater than right bibasilar atelectasis. No pneumothorax. No acute osseous abnormality. IMPRESSION: 1. New mild interstitial pulmonary edema and small bilateral pleural effusions, consistent with congestive heart failure.  Electronically Signed   By: Titus Dubin M.D.   On: 08/09/2018 04:46   Dg Chest Port 1 View  Result Date: 08/01/2018 CLINICAL DATA:  Weakness EXAM: PORTABLE CHEST 1 VIEW COMPARISON:  01/20/2018 FINDINGS: Cardiomegaly. Post sternotomy changes. Left-sided pacing device and valve prosthesis. Scarring at the left base. No focal airspace disease or effusion. No pneumothorax. IMPRESSION: Cardiomegaly.  Mild scarring at the left base. Electronically Signed   By: Donavan Foil M.D.   On: 08/01/2018 19:05    Echocardiogram: Ejection fraction 55%.   Subjective: Patient was seen and examined on the day of discharge.  He is ambulating in the hallway.  On room air.  Denies any complaints.  He was worried about his kidney functions and is rescheduled to follow-up with nephrology as outpatient this week.  No chest pain or shortness of breath today.   Discharge Exam: Vitals:   08/11/18 0552 08/11/18 0800  BP: 134/68   Pulse: 60   Resp: 18   Temp: 98 F (36.7 C)   SpO2: 96% 94%   Vitals:   08/10/18 1919 08/11/18 0552 08/11/18 0552  08/11/18 0800  BP: (!) 128/59 134/68 134/68   Pulse: (!) 59 65 60   Resp: 18 18 18    Temp: (!) 97 F (36.1 C) 98 F (36.7 C) 98 F (36.7 C)   TempSrc:  Oral Oral   SpO2: 96% 96% 96% 94%  Weight:      Height:        General: Pt is alert, awake, not in acute distress, on room air Cardiovascular: RRR, S1/S2 +, no rubs, no gallops, AICD in place. Respiratory: CTA bilaterally, no wheezing, no rhonchi Abdominal: Soft, NT, ND, bowel sounds + Extremities: no edema, no cyanosis    The results of significant diagnostics from this hospitalization (including imaging, microbiology, ancillary and laboratory) are listed below for reference.     Microbiology: Recent Results (from the past 240 hour(s))  SARS Coronavirus 2 [facility] order, Performed in [place] hospital lab)     Status: None   Collection Time: 08/01/18  7:19 PM  Result Value Ref Range Status   SARS Coronavirus 2 NEGATIVE NEGATIVE Final    Comment: (NOTE) If result is NEGATIVE SARS-CoV-2 target nucleic acids are NOT DETECTED. The SARS-CoV-2 RNA is generally detectable in upper and lower  respiratory specimens during the acute phase of infection. The lowest  concentration of SARS-CoV-2 viral copies this assay can detect is 250  copies / mL. A negative result does not preclude SARS-CoV-2 infection  and should not be used as the sole basis for treatment or other  patient management decisions.  A negative result may occur with  improper specimen collection / handling, submission of specimen other  than nasopharyngeal swab, presence of viral mutation(s) within the  areas targeted by this assay, and inadequate number of viral copies  (<250 copies / mL). A negative result must be combined with clinical  observations, patient history, and epidemiological information. If result is POSITIVE SARS-CoV-2 target nucleic acids are DETECTED. The SARS-CoV-2 RNA is generally detectable in upper and lower  respiratory specimens dur ing  the acute phase of infection.  Positive  results are indicative of active infection with SARS-CoV-2.  Clinical  correlation with patient history and other diagnostic information is  necessary to determine patient infection status.  Positive results do  not rule out bacterial infection or co-infection with other viruses. If result is PRESUMPTIVE POSTIVE SARS-CoV-2 nucleic acids MAY BE PRESENT.  A presumptive positive result was obtained on the submitted specimen  and confirmed on repeat testing.  While 2019 novel coronavirus  (SARS-CoV-2) nucleic acids may be present in the submitted sample  additional confirmatory testing may be necessary for epidemiological  and / or clinical management purposes  to differentiate between  SARS-CoV-2 and other Sarbecovirus currently known to infect humans.  If clinically indicated additional testing with an alternate test  methodology [phone]) is advised. The SARS-CoV-2 RNA is generally  detectable in upper and lower respiratory sp ecimens during the acute  phase of infection. The expected result is Negative. Fact Sheet for Patients:  StrictlyIdeas.no Fact Sheet for Healthcare Providers: BankingDealers.co.za This test is not yet approved or cleared by the [place] FDA and has been authorized for detection and/or diagnosis of SARS-CoV-2 by FDA under an Emergency Use Authorization (EUA).  This EUA will remain in effect (meaning this test can be used) for the duration of the COVID-19 declaration under Section 564(b)(1) of the Act, 21 U.S.C. section 360bbb-3(b)(1), unless the authorization is terminated or revoked sooner. Performed at [facility], [place] [address], [place], [place] [address]   SARS Coronavirus 2 (CEPHEID- Performed in [place] hospital lab), Hosp Order     Status: None   Collection Time: 08/09/18  3:49 AM  Result Value Ref Range Status   SARS Coronavirus 2  NEGATIVE NEGATIVE Final    Comment: (NOTE) If result is NEGATIVE SARS-CoV-2 target nucleic acids are NOT DETECTED. The SARS-CoV-2 RNA is generally detectable in upper and lower  respiratory specimens during the acute phase of infection. The lowest  concentration of SARS-CoV-2 viral copies this assay can detect is 250  copies / mL. A negative result does not preclude SARS-CoV-2 infection  and should not be used as the sole basis for treatment or other  patient management decisions.  A negative result may occur with  improper specimen collection / handling, submission of specimen other  than nasopharyngeal swab, presence of viral mutation(s) within the  areas targeted by this assay, and inadequate number of viral copies  (<250 copies / mL). A negative result must be combined with clinical  observations, patient history, and epidemiological information. If result is POSITIVE SARS-CoV-2 target nucleic acids are DETECTED. The SARS-CoV-2 RNA is generally detectable in upper and lower  respiratory specimens dur ing the acute phase of infection.  Positive  results are indicative of active infection with SARS-CoV-2.  Clinical  correlation with patient history and other diagnostic information is  necessary to determine patient infection status.  Positive results do  not rule out bacterial infection or co-infection with other viruses. If result is PRESUMPTIVE POSTIVE SARS-CoV-2 nucleic acids MAY BE PRESENT.   A presumptive positive result was obtained on the submitted specimen  and confirmed on repeat testing.  While 2019 novel coronavirus  (SARS-CoV-2) nucleic acids may be present in the submitted sample  additional confirmatory testing may be necessary for epidemiological  and / or clinical management purposes  to differentiate between  SARS-CoV-2 and other Sarbecovirus currently known to infect humans.  If clinically indicated additional testing with an alternate test  methodology [phone])  is advised. The SARS-CoV-2 RNA is generally  detectable in upper and lower respiratory sp ecimens during the acute  phase of infection. The expected result is Negative. Fact Sheet for Patients:  StrictlyIdeas.no Fact Sheet for Healthcare Providers: BankingDealers.co.za This test is not yet approved or cleared by the [place] FDA and has been authorized for  detection and/or diagnosis of SARS-CoV-2 by FDA under an Emergency Use Authorization (EUA).  This EUA will remain in effect (meaning this test can be used) for the duration of the COVID-19 declaration under Section 564(b)(1) of the Act, 21 U.S.C. section 360bbb-3(b)(1), unless the authorization is terminated or revoked sooner. Performed at [facility], [place] [address] [place], [place] [address]      Labs: BNP (last 3 results) Recent Labs    01/20/18 2102 08/09/18 0412  BNP 951.6* 626.9*   Basic Metabolic Panel: Recent Labs  Lab 08/09/18 0652 08/10/18 0028 08/11/18 0522  NA 136 138 140  K 4.8 4.2 4.0  CL 108 108 108  CO2 18* 20* 23  GLUCOSE 125* 170* 107*  BUN 31* 35* 43*  CREATININE 1.69* 2.00* 1.96*  CALCIUM 10.1 10.0 9.8   Liver Function Tests: Recent Labs  Lab 08/09/18 0652  AST 22  ALT 23  ALKPHOS 53  BILITOT 0.9  PROT 7.0  ALBUMIN 4.0   No results for input(s): LIPASE, AMYLASE in the last 168 hours. No results for input(s): AMMONIA in the last 168 hours. CBC: Recent Labs  Lab 08/09/18 0412 08/10/18 0028  WBC 9.8 6.4  NEUTROABS 7.2 5.7  HGB 9.2* 8.7*  HCT 31.5* 28.9*  MCV 72.2* 70.0*  PLT 210 164   Cardiac Enzymes: Recent Labs  Lab 08/09/18 0652  TROPONINI <0.03   BNP: Invalid input(s): POCBNP CBG: No results for input(s): GLUCAP in the last 168 hours. D-Dimer No results for input(s): DDIMER in the last 72 hours. Hgb A1c No results for input(s): HGBA1C in the last 72 hours. Lipid Profile No results for input(s): CHOL,  HDL, LDLCALC, TRIG, CHOLHDL, LDLDIRECT in the last 72 hours. Thyroid function studies No results for input(s): TSH, T4TOTAL, T3FREE, THYROIDAB in the last 72 hours.  Invalid input(s): FREET3 Anemia work up No results for input(s): VITAMINB12, FOLATE, FERRITIN, TIBC, IRON, RETICCTPCT in the last 72 hours. Urinalysis    Component Value Date/Time   COLORURINE YELLOW 03/19/2014 [place] 03/19/2014 1418   LABSPEC 1.016 03/19/2014 1418   PHURINE 6.0 03/19/2014 1418   GLUCOSEU NEGATIVE 03/19/2014 1418   HGBUR NEGATIVE 03/19/2014 1418   BILIRUBINUR negative 03/14/2017 1422   BILIRUBINUR neg 09/08/2014 1948   KETONESUR negative 03/14/2017 1422   KETONESUR NEGATIVE 03/19/2014 1418   PROTEINUR negative 03/14/2017 1422   PROTEINUR neg 09/08/2014 1948   PROTEINUR NEGATIVE 03/19/2014 1418   UROBILINOGEN 0.2 03/14/2017 1422   UROBILINOGEN 0.2 03/19/2014 1418   NITRITE Negative 03/14/2017 1422   NITRITE neg 09/08/2014 1948   NITRITE NEGATIVE 03/19/2014 1418   LEUKOCYTESUR Negative 03/14/2017 1422   Sepsis Labs Invalid input(s): PROCALCITONIN,  WBC,  LACTICIDVEN Microbiology Recent Results (from the past 240 hour(s))  SARS Coronavirus 2 [facility] order, Performed in [place] hospital lab)     Status: None   Collection Time: 08/01/18  7:19 PM  Result Value Ref Range Status   SARS Coronavirus 2 NEGATIVE NEGATIVE Final    Comment: (NOTE) If result is NEGATIVE SARS-CoV-2 target nucleic acids are NOT DETECTED. The SARS-CoV-2 RNA is generally detectable in upper and lower  respiratory specimens during the acute phase of infection. The lowest  concentration of SARS-CoV-2 viral copies this assay can detect is 250  copies / mL. A negative result does not preclude SARS-CoV-2 infection  and should not be used as the sole basis for treatment or other  patient management decisions.  A negative result may  occur with  improper specimen collection / handling, submission of specimen  other  than nasopharyngeal swab, presence of viral mutation(s) within the  areas targeted by this assay, and inadequate number of viral copies  (<250 copies / mL). A negative result must be combined with clinical  observations, patient history, and epidemiological information. If result is POSITIVE SARS-CoV-2 target nucleic acids are DETECTED. The SARS-CoV-2 RNA is generally detectable in upper and lower  respiratory specimens dur ing the acute phase of infection.  Positive  results are indicative of active infection with SARS-CoV-2.  Clinical  correlation with patient history and other diagnostic information is  necessary to determine patient infection status.  Positive results do  not rule out bacterial infection or co-infection with other viruses. If result is PRESUMPTIVE POSTIVE SARS-CoV-2 nucleic acids MAY BE PRESENT.   A presumptive positive result was obtained on the submitted specimen  and confirmed on repeat testing.  While 2019 novel coronavirus  (SARS-CoV-2) nucleic acids may be present in the submitted sample  additional confirmatory testing may be necessary for epidemiological  and / or clinical management purposes  to differentiate between  SARS-CoV-2 and other Sarbecovirus currently known to infect humans.  If clinically indicated additional testing with an alternate test  methodology [phone]) is advised. The SARS-CoV-2 RNA is generally  detectable in upper and lower respiratory sp ecimens during the acute  phase of infection. The expected result is Negative. Fact Sheet for Patients:  StrictlyIdeas.no Fact Sheet for Healthcare Providers: BankingDealers.co.za This test is not yet approved or cleared by the [place] FDA and has been authorized for detection and/or diagnosis of SARS-CoV-2 by FDA under an Emergency Use Authorization (EUA).  This EUA will remain in effect (meaning this test can be used) for the duration  of the COVID-19 declaration under Section 564(b)(1) of the Act, 21 U.S.C. section 360bbb-3(b)(1), unless the authorization is terminated or revoked sooner. Performed at [facility], [place] [address], [place], [place] [address]   SARS Coronavirus 2 (CEPHEID- Performed in [place] hospital lab), Hosp Order     Status: None   Collection Time: 08/09/18  3:49 AM  Result Value Ref Range Status   SARS Coronavirus 2 NEGATIVE NEGATIVE Final    Comment: (NOTE) If result is NEGATIVE SARS-CoV-2 target nucleic acids are NOT DETECTED. The SARS-CoV-2 RNA is generally detectable in upper and lower  respiratory specimens during the acute phase of infection. The lowest  concentration of SARS-CoV-2 viral copies this assay can detect is 250  copies / mL. A negative result does not preclude SARS-CoV-2 infection  and should not be used as the sole basis for treatment or other  patient management decisions.  A negative result may occur with  improper specimen collection / handling, submission of specimen other  than nasopharyngeal swab, presence of viral mutation(s) within the  areas targeted by this assay, and inadequate number of viral copies  (<250 copies / mL). A negative result must be combined with clinical  observations, patient history, and epidemiological information. If result is POSITIVE SARS-CoV-2 target nucleic acids are DETECTED. The SARS-CoV-2 RNA is generally detectable in upper and lower  respiratory specimens dur ing the acute phase of infection.  Positive  results are indicative of active infection with SARS-CoV-2.  Clinical  correlation with patient history and other diagnostic information is  necessary to determine patient infection status.  Positive results do  not rule out bacterial infection or co-infection with other viruses. If result is PRESUMPTIVE  POSTIVE SARS-CoV-2 nucleic acids MAY BE PRESENT.   A presumptive positive result was obtained on the  submitted specimen  and confirmed on repeat testing.  While 2019 novel coronavirus  (SARS-CoV-2) nucleic acids may be present in the submitted sample  additional confirmatory testing may be necessary for epidemiological  and / or clinical management purposes  to differentiate between  SARS-CoV-2 and other Sarbecovirus currently known to infect humans.  If clinically indicated additional testing with an alternate test  methodology [phone]) is advised. The SARS-CoV-2 RNA is generally  detectable in upper and lower respiratory sp ecimens during the acute  phase of infection. The expected result is Negative. Fact Sheet for Patients:  StrictlyIdeas.no Fact Sheet for Healthcare Providers: BankingDealers.co.za This test is not yet approved or cleared by the [place] FDA and has been authorized for detection and/or diagnosis of SARS-CoV-2 by FDA under an Emergency Use Authorization (EUA).  This EUA will remain in effect (meaning this test can be used) for the duration of the COVID-19 declaration under Section 564(b)(1) of the Act, 21 U.S.C. section 360bbb-3(b)(1), unless the authorization is terminated or revoked sooner. Performed at [facility], [place] [address] [place],  [address]      Time coordinating discharge: 32 minutes  SIGNED:   Barb Merino, MD  [facility] 08/11/2018, 10:55 AM Pager [phone]  If 7PM-7AM, please contact night-coverage [email or web address] Password [email or web address]

## 2018-08-12 ENCOUNTER — Telehealth: Payer: Self-pay | Admitting: *Deleted

## 2018-08-12 ENCOUNTER — Encounter: Payer: Self-pay | Admitting: Nephrology

## 2018-08-12 DIAGNOSIS — I469 Cardiac arrest, cause unspecified: Secondary | ICD-10-CM | POA: Insufficient documentation

## 2018-08-12 NOTE — Telephone Encounter (Signed)
-----   Message from Deboraha Sprang, MD sent at 08/11/2018  8:33 AM EDT ----- Remote reviewed. This remote is abnormal for persistent afib  LADIES  Could you do me a great favor PLZ  and get another transmission and see if he has reverted to sinus  Thanks SL

## 2018-08-12 NOTE — Telephone Encounter (Signed)
Called pt concerning hosp f/u appt that was made by [facility] for 08/18/18. Pt verified appt change to an virtual, and updated new email. Completed TCM call below.[name]  Transition Care Management Follow-up Telephone Call   Date discharged? 08/11/18   How have you been since you were released from the hospital? Pt states he is doing fine   Do you understand why you were in the hospital? YES   Do you understand the discharge instructions? YES   Where were you discharged to? Home   Items Reviewed:  Medications reviewed: Pt states there was no changes   Allergies reviewed: YES  Dietary changes reviewed: YES, low salt, and he states his appetite is good  Referrals reviewed: YES, he states he has appt w/nephologist 5/7    Functional Questionnaire:   Activities of Daily Living (ADLs):   He states he are independent in the following: ambulation, bathing and hygiene, feeding, continence, grooming, toileting and dressing States he doesn't need assistance    Any transportation issues/concerns?: NO   Any patient concerns? NO   Confirmed importance and date/time of follow-up visits scheduled YES, virtual 08/18/18  Provider Appointment booked with Dr. Quay Burow  Confirmed with patient if condition begins to worsen call PCP or go to the ER.  Patient was given the office number and encouraged to call back with question or concerns.  : YES

## 2018-08-12 NOTE — Telephone Encounter (Signed)
Spoke w/ pt wife and requested that pt send a manual transmission w/ his home monitor. She stated that she would help pt do this today.

## 2018-08-12 NOTE — Telephone Encounter (Signed)
LVM for pt. Dr Caryl Comes would like to set up a time to do a virtual visit with him to discuss.

## 2018-08-12 NOTE — Telephone Encounter (Signed)
Patient sent in transmission as requested by Dr. Caryl Comes. Presenting rhythm from 5/5 at 12:16 shows AT/Vp @ 71bpm with intermittent A-pacing. AT/AF burden 97.3% since 07/03/18. Routed to Dr. Caryl Comes for review and recommendations.

## 2018-08-14 DIAGNOSIS — I214 Non-ST elevation (NSTEMI) myocardial infarction: Secondary | ICD-10-CM | POA: Diagnosis not present

## 2018-08-14 DIAGNOSIS — D649 Anemia, unspecified: Secondary | ICD-10-CM | POA: Diagnosis not present

## 2018-08-14 DIAGNOSIS — Z952 Presence of prosthetic heart valve: Secondary | ICD-10-CM | POA: Diagnosis not present

## 2018-08-14 DIAGNOSIS — I5033 Acute on chronic diastolic (congestive) heart failure: Secondary | ICD-10-CM | POA: Diagnosis not present

## 2018-08-14 DIAGNOSIS — D509 Iron deficiency anemia, unspecified: Secondary | ICD-10-CM | POA: Diagnosis not present

## 2018-08-14 DIAGNOSIS — I4891 Unspecified atrial fibrillation: Secondary | ICD-10-CM | POA: Diagnosis not present

## 2018-08-14 DIAGNOSIS — N183 Chronic kidney disease, stage 3 (moderate): Secondary | ICD-10-CM | POA: Diagnosis not present

## 2018-08-14 NOTE — Telephone Encounter (Signed)
Spoke with pt and he agrees to a virtual visit with Dr Caryl Comes on 5/8 @ 1200. He states he has been feeling "ok" with the exception of some SOB and swelling in his legs. Today, he will see Dr Lorrene Reid and [facility]. I advised him it would be okay for her to contact Dr Caryl Comes today regarding fluid management and Dr Olin Pia plan for his AF. Pt has verbalized understanding and has no additional questions.

## 2018-08-15 ENCOUNTER — Other Ambulatory Visit: Payer: Self-pay

## 2018-08-15 ENCOUNTER — Encounter: Payer: Self-pay | Admitting: Internal Medicine

## 2018-08-15 ENCOUNTER — Telehealth (INDEPENDENT_AMBULATORY_CARE_PROVIDER_SITE_OTHER): Payer: Medicare Other | Admitting: Internal Medicine

## 2018-08-15 VITALS — Ht 66.25 in | Wt 151.1 lb

## 2018-08-15 DIAGNOSIS — I4819 Other persistent atrial fibrillation: Secondary | ICD-10-CM | POA: Diagnosis not present

## 2018-08-15 DIAGNOSIS — I5022 Chronic systolic (congestive) heart failure: Secondary | ICD-10-CM

## 2018-08-15 DIAGNOSIS — Z952 Presence of prosthetic heart valve: Secondary | ICD-10-CM

## 2018-08-15 DIAGNOSIS — Z95810 Presence of automatic (implantable) cardiac defibrillator: Secondary | ICD-10-CM

## 2018-08-15 NOTE — H&P (View-Only) (Signed)
Electrophysiology TeleHealth Note   Due to national recommendations of social distancing due to COVID 19, an audio/video telehealth visit is felt to be most appropriate for this patient at this time.  See MyChart message from today for the patient's consent to telehealth for [facility]   Date:  08/15/2018   ID:  [name], DOB 11-Jul-1933, MRN [id number]  Location: patient's home  Provider location: [address], [place]  Evaluation Performed: Follow-up visit  PCP:  Carlos Rail, MD  Cardiologist:   [facility] Electrophysiologist:  SK   Chief Complaint:  Swelling   History of Present Illness:    [name] is a 85 y.o. male who presents via audio/video conferencing for a telehealth visit today.  Since last being seen in our clinic, the patient reports acute onset of lower extremity edema and dyspnea.  She was hospitalized last week for the above.  Felt to be a   pulmonary edema  Noted to be significantly anemic.   Echocardiogram demonstrated interval improvement in LV function.  From the notes, it was unclear whether the persistent atrial tachycardia was appreciated.  Notes do not comment on the anemia  Saw Dr. Roque Gregory yesterday for renal.  She did not have spoken a couple of times.  Reviewing the records from 01/2018 he presented quite ill with an atrial tachycardia at that time as well.  Hemoglobin was 8.9 at that admission.  Iron saturation was low and he had some OTC iron as best as I can tell.  There was some rebound up to 10.9 and then a decline again into the mid eights  He feels extremely cold needing to go to bed with a heating pad   DATE TEST EF   10/14 Echo   60 % AoSt   severe  2015 LHC  LIMA  SVG-OM,SVG RCA patent,  10/15 Echo   35-40 %  LAE severe  10/17 Echo  35-40   11/19 Echo  40-45%   5/20 Echo  50-55% (confirmed with PR)     The patient denies symptoms of fevers, chills, cough, or new SOB worrisome for COVID 19.    Past  Medical History:  Diagnosis Date  . Aortic stenosis, severe    s/p TAVR with Edwards Sapien 3 THV (size 26 mm, model # 9600TFX, serial # [id number])  . Arthritis   . Cardiac arrest ([place])   . Chronic systolic CHF (congestive heart failure) ([place]) 01/21/2018  . Coronary artery disease    NSTEMI 10/19  . Essential hypertension   . Headache   . History of hiatal hernia   . Hyperlipidemia   . Kidney stones   . OSA (obstructive sleep apnea) 02/05/2018   Severe obstructive sleep apnea with an AHI of 36.8/h with no significant central sleep apnea.   Now on CPAP at  5 cm H2O  . Peripheral vascular disease ([place])   . S/P cardiac cath 01/22/18  01/23/2018    Past Surgical History:  Procedure Laterality Date  . ADENOIDECTOMY    . APPENDECTOMY    . CARDIAC CATHETERIZATION   11/16/2009   . CARDIAC DEFIBRILLATOR PLACEMENT    . CARDIOVERSION N/A 02/24/2018   Procedure: CARDIOVERSION;  Surgeon: Sueanne Margarita, MD;  Location: [facility] ENDOSCOPY;  Service: Cardiovascular;  Laterality: N/A;  . cataract surgery      Bilateral cataract surgery  . CORONARY ARTERY BYPASS GRAFT     in 1982 and '84 with subsequent PTCI  .  EP IMPLANTABLE DEVICE N/A 03/21/2016   Procedure: ICD Generator Changeout;  Surgeon: Carlos Sprang, MD;  Location: [place] CV LAB;  Service: Cardiovascular;  Laterality: N/A;  . HERNIA REPAIR    . LEFT AND RIGHT HEART CATHETERIZATION WITH CORONARY/GRAFT ANGIOGRAM N/A 02/01/2014   Procedure: LEFT AND RIGHT HEART CATHETERIZATION WITH [name];  Surgeon: Carlos Grooms, MD;  Location: [facility] CATH LAB;  Service: Cardiovascular;  Laterality: N/A;  . left elbow tendon surgery    . LEFT HEART CATH AND CORS/GRAFTS ANGIOGRAPHY N/A 01/22/2018   Procedure: LEFT HEART CATH AND CORS/GRAFTS ANGIOGRAPHY;  Surgeon: Carlos Man, MD;  Location: [place] CV LAB;  Service: Cardiovascular;  Laterality: N/A;  . TEE WITHOUT CARDIOVERSION N/A 03/23/2014   Procedure: TRANSESOPHAGEAL  ECHOCARDIOGRAM (TEE);  Surgeon: Carlos Blanks, MD;  Location: [place];  Service: Open Heart Surgery;  Laterality: N/A;  . TONSILLECTOMY    . TRANSCATHETER AORTIC VALVE REPLACEMENT, TRANSFEMORAL N/A 03/23/2014   Procedure: TRANSCATHETER AORTIC VALVE REPLACEMENT, TRANSFEMORAL;  Surgeon: Carlos Blanks, MD;  Location: [place];  Service: Open Heart Surgery;  Laterality: N/A;    Current Outpatient Medications  Medication Sig Dispense Refill  . amiodarone (PACERONE) 200 MG tablet Take 1 tablet (200 mg total) by mouth daily. 30 tablet 6  . amLODipine (NORVASC) 5 MG tablet TAKE 1 TABLET BY MOUTH DAILY. 90 tablet 3  . b complex vitamins capsule Take 1 capsule by mouth daily.    . Calcium Carbonate Antacid (TUMS PO) Take 2 tablets by mouth 2 (two) times daily as needed (indigestion/ acid reflux).    . carvedilol (COREG) 6.25 MG tablet Take 1 tablet (6.25 mg total) by mouth 2 (two) times daily with a meal for 30 days. 60 tablet 0  . Coenzyme Q10-Vitamin E (QUNOL ULTRA COQ10 PO) Take 1 Dose by mouth at bedtime.     . diazepam (VALIUM) 5 MG tablet TAKE 1 TABLET BY MOUTH ONCE DAILY AS NEEDED 30 tablet 2  . ferrous sulfate 325 (65 FE) MG tablet Take 1 tablet (325 mg total) by mouth daily with breakfast.  3  . furosemide (LASIX) 40 MG tablet TAKE 1 TABLET (40 MG TOTAL) BY MOUTH DAILY. 90 tablet 3  . ketotifen (ZADITOR) 0.025 % ophthalmic solution Place 1 drop into both eyes daily as needed (seasonal allergies).    Marland Kitchen KRILL OIL PO Take 1 capsule by mouth at bedtime.     . Magnesium Oxide (MAG-OXIDE PO) Take 1 tablet by mouth at bedtime.     . Melatonin 1 MG TABS Take 3 mg by mouth at bedtime as needed (sleep).     . Multiple Vitamin (MULTIVITAMIN WITH MINERALS) TABS tablet Take 1 tablet by mouth daily.    . nitroGLYCERIN (NITROSTAT) 0.4 MG SL tablet Place 1 tablet (0.4 mg total) under the tongue every 5 (five) minutes as needed for chest pain (for chest pain (MAX of 3 doses)). 25 tablet 6  .  Potassium 99 MG TABS Take 99 mg by mouth 2 (two) times daily.    . Rivaroxaban (XARELTO) 15 MG TABS tablet Take 1 tablet (15 mg total) by mouth daily with supper. 30 tablet 5  . rosuvastatin (CRESTOR) 40 MG tablet TAKE 1 TABLET BY MOUTH DAILY. 90 tablet 3  . trospium (SANCTURA) 20 MG tablet Take 20 mg by mouth 2 (two) times daily.     No current facility-administered medications for this visit.     Allergies:   Cyclobenzaprine hcl and  Tetracycline   Social History:  The patient  reports that he quit smoking about 65 years ago. He has never used smokeless tobacco. He reports current alcohol use of about 5.0 - 6.0 standard drinks of alcohol per week. He reports that he does not use drugs.   Family History:  The patient's   family history includes Coronary artery disease in an other family member; Heart attack (age of onset: 34) in his father.   ROS:  Please see the history of present illness.   All other systems are personally reviewed and negative.    Exam:    Vital Signs:  Ht 5' 6.25" (1.683 m)   Wt 151 lb 1.6 oz (68.5 kg)   BMI 24.20 kg/m     Well appearing, alert and conversant, regular work of breathing,  good skin color Eyes- anicteric, neuro- grossly intact, skin- no apparent rash or lesions or cyanosis, mouth- oral mucosa is pink   Labs/Other Tests and Data Reviewed:    Recent Labs: 01/20/2018: Magnesium 2.3 02/18/2018: TSH 2.530 08/09/2018: ALT 23; B Natriuretic Peptide 771.0 08/10/2018: Hemoglobin 8.7; Platelets 164 08/11/2018: BUN 43; Creatinine, Ser 1.96; Potassium 4.0; Sodium 140   Wt Readings from Last 3 Encounters:  08/15/18 151 lb 1.6 oz (68.5 kg)  08/10/18 157 lb 10.1 oz (71.5 kg)  08/01/18 152 lb (68.9 kg)     Other studies personally reviewed: Additional studies/ records that were reviewed today include: As above  Review of the above records today demonstrates:  As above    Last device remote is reviewed from [place] PDF dated 5/20 which reveals normal device  function,   arrhythmias - persistent atrial tach/flutter ACl 330-360 msec   ASSESSMENT & PLAN:   Sudden cardiac death-aborted -    AUTOMATIC IMPLANTABLE CARDIAC DEFIBRILLATOR SITU   .   S/P CABG (coronary artery bypass graft) -     Ischemic Cardiomyopathy  Aortic stenosis-s/p TAVR   Aortic insufficiency  new  Renal Insufficiency grade 3  Benign hypertensive heart disease   Anemia     atrial fibrillation//atrial tach  Patient is significantly volume overloaded with worsening renal function.  Importantly, there has been interval improvement in LV systolic function excluding pacemaker cardiomyopathy as the underlying cause of the initial decrease in LVEF. .    Multiple potential contributors include persistent atrial tachycardia.  This is suggested by his response to restoration of sinus rhythm last fall.  In addition, he has significant anemia.  I suspect that this is iron deficiency based on the numbers last fall and ongoing worsening of his hemoglobin.  Restoration of sinus rhythm is likely going to be significantly hemodynamically beneficial.  We will potentially have to hold anticoagulation given his falling hemoglobin at some point.  For now, would proceed with cardioversion as a tier 1/2 as well as continue and rebolus w amiodarone for [place] to be able to help maintain sinus rhythm as his atrial arrhythmia has been recurrent.  Will need an ecg to look at atrial arrhythmia to consider ablation  He was on Lasix 80 this morning and has diuresed moderately this a.m.  He has taken another dose already.  He will need close follow-up.  Iron infusions are anticipated if deficiency is confirmed    COVID 19 screen The patient denies symptoms of COVID 19 at this time.  The importance of social distancing was discussed today.  Follow-up:  2 w Next remote: As Scheduled   Current medicines are reviewed at length with the  patient today.   The patient does not have concerns  regarding his medicines.  The following changes were made today: Increase amiodarone 400 twice daily x2 weeks then 400 a day x2 weeks and return to 200 mg a day  Labs/ tests ordered today include:   DCCV next week if possible tier 1/2   Thanks Obtain TSH and LFTs when comes in for cardioversion  No orders of the defined types were placed in this encounter.   Future tests ( post COVID )     Patient Risk:  after full review of this patients clinical status, I feel that they are at moderate risk at this time.  Today, I have spent 23 minutes with the patient with telehealth technology discussing the above.  Also spoke again with Dr. Lorrene Reid  Signed, Virl Axe, MD  08/15/2018 12:04 PM     [place] [address] [place] [address] [phone] (office) [phone] (fax)

## 2018-08-15 NOTE — Progress Notes (Signed)
Electrophysiology TeleHealth Note   Due to national recommendations of social distancing due to COVID 19, an audio/video telehealth visit is felt to be most appropriate for this patient at this time.  See MyChart message from today for the patient's consent to telehealth for [facility].   Date:  08/15/2018   ID:  [name], DOB 1933/05/19, MRN [id number]  Location: patient's home  Provider location: [address], [place]  Evaluation Performed: Follow-up visit  PCP:  Binnie Rail, MD  Cardiologist:   [facility] Electrophysiologist:  SK   Chief Complaint:  Swelling   History of Present Illness:    [name] is a 85 y.o. male who presents via audio/video conferencing for a telehealth visit today.  Since last being seen in our clinic, the patient reports acute onset of lower extremity edema and dyspnea.  She was hospitalized last week for the above.  Felt to be a   pulmonary edema  Noted to be significantly anemic.   Echocardiogram demonstrated interval improvement in LV function.  From the notes, it was unclear whether the persistent atrial tachycardia was appreciated.  Notes do not comment on the anemia  Saw Dr. Roque Lias yesterday for renal.  She did not have spoken a couple of times.  Reviewing the records from 01/2018 he presented quite ill with an atrial tachycardia at that time as well.  Hemoglobin was 8.9 at that admission.  Iron saturation was low and he had some OTC iron as best as I can tell.  There was some rebound up to 10.9 and then a decline again into the mid eights  He feels extremely cold needing to go to bed with a heating pad   DATE TEST EF   10/14 Echo   60 % AoSt   severe  2015 LHC  LIMA  SVG-OM,SVG RCA patent,  10/15 Echo   35-40 %  LAE severe  10/17 Echo  35-40   11/19 Echo  40-45%   5/20 Echo  50-55% (confirmed with PR)     The patient denies symptoms of fevers, chills, cough, or new SOB worrisome for COVID 19.    Past  Medical History:  Diagnosis Date  . Aortic stenosis, severe    s/p TAVR with Edwards Sapien 3 THV (size 26 mm, model # 9600TFX, serial # [id number])  . Arthritis   . Cardiac arrest ([place])   . Chronic systolic CHF (congestive heart failure) ([place]) 01/21/2018  . Coronary artery disease    NSTEMI 10/19  . Essential hypertension   . Headache   . History of hiatal hernia   . Hyperlipidemia   . Kidney stones   . OSA (obstructive sleep apnea) 02/05/2018   Severe obstructive sleep apnea with an AHI of 36.8/h with no significant central sleep apnea.   Now on CPAP at  5 cm H2O  . Peripheral vascular disease ([place])   . S/P cardiac cath 01/22/18  01/23/2018    Past Surgical History:  Procedure Laterality Date  . ADENOIDECTOMY    . APPENDECTOMY    . CARDIAC CATHETERIZATION   11/16/2009   . CARDIAC DEFIBRILLATOR PLACEMENT    . CARDIOVERSION N/A 02/24/2018   Procedure: CARDIOVERSION;  Surgeon: Sueanne Margarita, MD;  Location: [facility] ENDOSCOPY;  Service: Cardiovascular;  Laterality: N/A;  . cataract surgery      Bilateral cataract surgery  . CORONARY ARTERY BYPASS GRAFT     in 1982 and '84 with subsequent PTCI  .  EP IMPLANTABLE DEVICE N/A 03/21/2016   Procedure: ICD Generator Changeout;  Surgeon: Deboraha Sprang, MD;  Location: [place] CV LAB;  Service: Cardiovascular;  Laterality: N/A;  . HERNIA REPAIR    . LEFT AND RIGHT HEART CATHETERIZATION WITH CORONARY/GRAFT ANGIOGRAM N/A 02/01/2014   Procedure: LEFT AND RIGHT HEART CATHETERIZATION WITH Beatrix Fetters;  Surgeon: Sinclair Grooms, MD;  Location: [facility] CATH LAB;  Service: Cardiovascular;  Laterality: N/A;  . left elbow tendon surgery    . LEFT HEART CATH AND CORS/GRAFTS ANGIOGRAPHY N/A 01/22/2018   Procedure: LEFT HEART CATH AND CORS/GRAFTS ANGIOGRAPHY;  Surgeon: Leonie Man, MD;  Location: [place] CV LAB;  Service: Cardiovascular;  Laterality: N/A;  . TEE WITHOUT CARDIOVERSION N/A 03/23/2014   Procedure: TRANSESOPHAGEAL  ECHOCARDIOGRAM (TEE);  Surgeon: Burnell Blanks, MD;  Location: [place];  Service: Open Heart Surgery;  Laterality: N/A;  . TONSILLECTOMY    . TRANSCATHETER AORTIC VALVE REPLACEMENT, TRANSFEMORAL N/A 03/23/2014   Procedure: TRANSCATHETER AORTIC VALVE REPLACEMENT, TRANSFEMORAL;  Surgeon: Burnell Blanks, MD;  Location: [place];  Service: Open Heart Surgery;  Laterality: N/A;    Current Outpatient Medications  Medication Sig Dispense Refill  . amiodarone (PACERONE) 200 MG tablet Take 1 tablet (200 mg total) by mouth daily. 30 tablet 6  . amLODipine (NORVASC) 5 MG tablet TAKE 1 TABLET BY MOUTH DAILY. 90 tablet 3  . b complex vitamins capsule Take 1 capsule by mouth daily.    . Calcium Carbonate Antacid (TUMS PO) Take 2 tablets by mouth 2 (two) times daily as needed (indigestion/ acid reflux).    . carvedilol (COREG) 6.25 MG tablet Take 1 tablet (6.25 mg total) by mouth 2 (two) times daily with a meal for 30 days. 60 tablet 0  . Coenzyme Q10-Vitamin E (QUNOL ULTRA COQ10 PO) Take 1 Dose by mouth at bedtime.     . diazepam (VALIUM) 5 MG tablet TAKE 1 TABLET BY MOUTH ONCE DAILY AS NEEDED 30 tablet 2  . ferrous sulfate 325 (65 FE) MG tablet Take 1 tablet (325 mg total) by mouth daily with breakfast.  3  . furosemide (LASIX) 40 MG tablet TAKE 1 TABLET (40 MG TOTAL) BY MOUTH DAILY. 90 tablet 3  . ketotifen (ZADITOR) 0.025 % ophthalmic solution Place 1 drop into both eyes daily as needed (seasonal allergies).    Marland Kitchen KRILL OIL PO Take 1 capsule by mouth at bedtime.     . Magnesium Oxide (MAG-OXIDE PO) Take 1 tablet by mouth at bedtime.     . Melatonin 1 MG TABS Take 3 mg by mouth at bedtime as needed (sleep).     . Multiple Vitamin (MULTIVITAMIN WITH MINERALS) TABS tablet Take 1 tablet by mouth daily.    . nitroGLYCERIN (NITROSTAT) 0.4 MG SL tablet Place 1 tablet (0.4 mg total) under the tongue every 5 (five) minutes as needed for chest pain (for chest pain (MAX of 3 doses)). 25 tablet 6  .  Potassium 99 MG TABS Take 99 mg by mouth 2 (two) times daily.    . Rivaroxaban (XARELTO) 15 MG TABS tablet Take 1 tablet (15 mg total) by mouth daily with supper. 30 tablet 5  . rosuvastatin (CRESTOR) 40 MG tablet TAKE 1 TABLET BY MOUTH DAILY. 90 tablet 3  . trospium (SANCTURA) 20 MG tablet Take 20 mg by mouth 2 (two) times daily.     No current facility-administered medications for this visit.     Allergies:   Cyclobenzaprine hcl and  Tetracycline   Social History:  The patient  reports that he quit smoking about 65 years ago. He has never used smokeless tobacco. He reports current alcohol use of about 5.0 - 6.0 standard drinks of alcohol per week. He reports that he does not use drugs.   Family History:  The patient's   family history includes Coronary artery disease in an other family member; Heart attack (age of onset: 61) in his father.   ROS:  Please see the history of present illness.   All other systems are personally reviewed and negative.    Exam:    Vital Signs:  Ht 5' 6.25" (1.683 m)   Wt 151 lb 1.6 oz (68.5 kg)   BMI 24.20 kg/m     Well appearing, alert and conversant, regular work of breathing,  good skin color Eyes- anicteric, neuro- grossly intact, skin- no apparent rash or lesions or cyanosis, mouth- oral mucosa is pink   Labs/Other Tests and Data Reviewed:    Recent Labs: 01/20/2018: Magnesium 2.3 02/18/2018: TSH 2.530 08/09/2018: ALT 23; B Natriuretic Peptide 771.0 08/10/2018: Hemoglobin 8.7; Platelets 164 08/11/2018: BUN 43; Creatinine, Ser 1.96; Potassium 4.0; Sodium 140   Wt Readings from Last 3 Encounters:  08/15/18 151 lb 1.6 oz (68.5 kg)  08/10/18 157 lb 10.1 oz (71.5 kg)  08/01/18 152 lb (68.9 kg)     Other studies personally reviewed: Additional studies/ records that were reviewed today include: As above  Review of the above records today demonstrates:  As above    Last device remote is reviewed from [place] PDF dated 5/20 which reveals normal device  function,   arrhythmias - persistent atrial tach/flutter ACl 330-360 msec   ASSESSMENT & PLAN:   Sudden cardiac death-aborted -    AUTOMATIC IMPLANTABLE CARDIAC DEFIBRILLATOR SITU   .   S/P CABG (coronary artery bypass graft) -     Ischemic Cardiomyopathy  Aortic stenosis-s/p TAVR   Aortic insufficiency  new  Renal Insufficiency grade 3  Benign hypertensive heart disease   Anemia     atrial fibrillation//atrial tach  Patient is significantly volume overloaded with worsening renal function.  Importantly, there has been interval improvement in LV systolic function excluding pacemaker cardiomyopathy as the underlying cause of the initial decrease in LVEF. .    Multiple potential contributors include persistent atrial tachycardia.  This is suggested by his response to restoration of sinus rhythm last fall.  In addition, he has significant anemia.  I suspect that this is iron deficiency based on the numbers last fall and ongoing worsening of his hemoglobin.  Restoration of sinus rhythm is likely going to be significantly hemodynamically beneficial.  We will potentially have to hold anticoagulation given his falling hemoglobin at some point.  For now, would proceed with cardioversion as a tier 1/2 as well as continue and rebolus w amiodarone for [place] to be able to help maintain sinus rhythm as his atrial arrhythmia has been recurrent.  Will need an ecg to look at atrial arrhythmia to consider ablation  He was on Lasix 80 this morning and has diuresed moderately this a.m.  He has taken another dose already.  He will need close follow-up.  Iron infusions are anticipated if deficiency is confirmed    COVID 19 screen The patient denies symptoms of COVID 19 at this time.  The importance of social distancing was discussed today.  Follow-up:  2 w Next remote: As Scheduled   Current medicines are reviewed at length with the  patient today.   The patient does not have concerns  regarding his medicines.  The following changes were made today: Increase amiodarone 400 twice daily x2 weeks then 400 a day x2 weeks and return to 200 mg a day  Labs/ tests ordered today include:   DCCV next week if possible tier 1/2   Thanks Obtain TSH and LFTs when comes in for cardioversion  No orders of the defined types were placed in this encounter.   Future tests ( post COVID )     Patient Risk:  after full review of this patients clinical status, I feel that they are at moderate risk at this time.  Today, I have spent 23 minutes with the patient with telehealth technology discussing the above.  Also spoke again with Dr. Lorrene Reid  Signed, Virl Axe, MD  08/15/2018 12:04 PM     [place] [address] [place] [address] [phone] (office) [phone] (fax)

## 2018-08-17 NOTE — Progress Notes (Signed)
Virtual Visit via Video Note  I connected with [name] on 08/18/18 at  1:00 PM EDT by a video enabled telemedicine application and verified that I am speaking with the correct person using two identifiers.   I discussed the limitations of evaluation and management by telemedicine and the availability of in person appointments. The patient expressed understanding and agreed to proceed.  The patient is currently at home and I am in the office.  His wife and other family members were present.    No referring provider.    History of Present Illness: This visit is for hospital follow up.    Admitted 08/09/18- 08/11/18  Recommendations for Outpatient Follow-up:  1. Follow up with PCP in 1-2 weeks 2. Please obtain BMP/CBC in one week 3. Follow-up with nephrology as scheduled.   He went to the ED for SOB and feeling weak.   He was SOB with minimal exertion.  He denied weight gain or edema.  He wears cpap but does not tolerate it well.  He does not have supplemental oxygen.  He was hypoxic in the ED and required 4 L.  CXR had interstitial edema.     Acute on chronic diastolic CHF with acute hypoxic resp failure: initally requried 4 L of oxygen. Treated with IV lasix Echo w/ improvement in EF - 55% Good clinical improvement with IF diuresis Was on RA and ambulating with no desaturation Seen by cardiology Metoprolol changed to Coreg BID Amlodipine continued Not on ACE-I due to CKD   AKI on CKD: Baseline Cr 1.7-1.9 Saw nephrology last week after d/c Cr on d/c 1.96  Afib, htn: Rate controlled, but according to cardiology he has been in atrial fibrillation BP stable On amiodarone, on BB Has BiV AICD, pacemaker On xarelto  OSA: On cpap  Uses intermittently when he can tolerate it  Anemia: Blood count stable in the hospital-he did not have a blood transfusion He is taking iron pill daily Had follow-up blood work with nephrology   Acute on chronic diastolic CHF, atrial  fibrillation: He states he feels ok.  He is not sure if he is SOB or not.  He just woke up and has been sleeping a lot.  He has not been doing any activity except for walking around the house to get from room to room he has no energy.  He has some leg swelling, but states it is better than when he was in the hospital.  He is taking the Lasix daily.  He was advised by nephrology to continue his current dose of furosemide.  According to his family his BUN last week at the nephrology visit was 35 and creatinine 1.59.  ESR was 40. Dr. Caryl Comes wrote his note that he was going to increase the amiodarone temporarily and consider cardioversion.  His family had some confusion regarding increasing the amiodarone and states they were not advised to do that.  Advised that they call his office to clarify.  He denies any palpitations and is unsure when he is in atrial fibrillation.  Hypertension: He is taking his medication as prescribed.  His blood pressure today was 133/64.  He denies any chest pain or palpitations.  Anemia: His hemoglobin last week at his nephrology visit was 9.6.  He is taking the iron once daily.  Sleep apnea: He uses the CPAP at night, but can only tolerate it for so long.  He does try to use it as long as possible.  Chronic kidney disease:  He did establish with nephrology.  He was advised to continue his current Lasix dosage.  An ultrasound of his kidney was ordered.  He has a follow-up in 1 month.   Review of Systems  Constitutional: Positive for chills. Negative for fever.  Respiratory: Positive for shortness of breath (moderate exertion). Negative for cough and wheezing.   Cardiovascular: Positive for leg swelling. Negative for chest pain and palpitations.  Neurological: Negative for dizziness and headaches.      Social History   Socioeconomic History  . Marital status: Married    Spouse name: Not on file  . Number of children: 3  . Years of education: Not on file  . Highest  education level: Not on file  Occupational History  . Occupation: Retired  Scientific laboratory technician  . Financial resource strain: Not hard at all  . Food insecurity:    Worry: Never true    Inability: Never true  . Transportation needs:    Medical: No    Non-medical: No  Tobacco Use  . Smoking status: Former Smoker    Last attempt to quit: 10/25/1952    Years since quitting: 65.8  . Smokeless tobacco: Never Used  Substance and Sexual Activity  . Alcohol use: Yes    Alcohol/week: 5.0 - 6.0 standard drinks    Types: 5 - 6 Glasses of wine per week    Comment: 5-6 times a week  . Drug use: No  . Sexual activity: Never  Lifestyle  . Physical activity:    Days per week: Not on file    Minutes per session: Not on file  . Stress: Only a little  Relationships  . Social connections:    Talks on phone: More than three times a week    Gets together: More than three times a week    Attends religious service: More than 4 times per year    Active member of club or organization: Not on file    Attends meetings of clubs or organizations: More than 4 times per year    Relationship status: Married  Other Topics Concern  . Not on file  Social History Narrative  . Not on file     Observations/Objective: Appears well in NAD Breathing normally, comfortably without distress  Blood pressure at home 133/64, pulse in the 60s Oxygen saturation at home on room air 92%   Lab Results  Component Value Date   WBC 6.4 08/10/2018   HGB 8.7 (L) 08/10/2018   HCT 28.9 (L) 08/10/2018   PLT 164 08/10/2018   GLUCOSE 107 (H) 08/11/2018   CHOL 130 01/21/2018   TRIG 209 (H) 01/21/2018   HDL 30 (L) 01/21/2018   LDLCALC 58 01/21/2018   ALT 23 08/09/2018   AST 22 08/09/2018   NA 140 08/11/2018   K 4.0 08/11/2018   CL 108 08/11/2018   CREATININE 1.96 (H) 08/11/2018   BUN 43 (H) 08/11/2018   CO2 23 08/11/2018   TSH 2.530 02/18/2018   PSA 4.9 (H) 11/21/2015   INR 1.27 01/20/2018   HGBA1C 6.2 (H) 01/20/2018     ECHOCARDIOGRAM COMPLETE   ECHOCARDIOGRAM REPORT       Patient Name:   [name] Date of Exam: 08/09/2018 Medical Rec #:  [id number]      Height:       66.3 in Accession #:    [id number]     Weight:       158.0 lb Date of Birth:  05/13/1933  BSA:          1.81 m Patient Age:    28 years       BP:           131/77 mmHg Patient Gender: M              HR:           60 bpm. Exam Location:  Inpatient    Procedure: 2D Echo, Cardiac Doppler and Color Doppler  Indications:    I50.42 Chronic combined systolic (congestive) and diastolic                 (congestive) heart failure   History:        Patient has prior history of Echocardiogram examinations, most                 recent 02/10/2018. CAD Pacemaker Aortic Valve Disease Risk                 Factors: Hypertension, Dyslipidemia and Sleep Apnea. TAVR.                 Cardiac Arrest.   Sonographer:    Tiffany Dance Referring Phys: Kamas   1. The left ventricle has moderate-severely reduced systolic function, with an ejection fraction of 30-35%. The cavity size was mild to moderately dilated. Left ventricular diastolic Doppler parameters are indeterminate.  2. LVEF is approximately 50 to 55% with apical hypokinesis.  3. The right ventricle has normal systolic function. The cavity was normal. There is no increase in right ventricular wall thickness.  4. Left atrial size was severely dilated.  5. Trivial pericardial effusion is present.  6. The mitral valve is abnormal. Moderate thickening of the mitral valve leaflet. There is mild mitral annular calcification present.  7. The aortic valve is tricuspid. Mild thickening of the aortic valve. Aortic valve regurgitation is trivial by color flow Doppler.  8. The aortic root is normal in size and structure.  9. The inferior vena cava was dilated in size with <50% respiratory variability. 10. The interatrial septum was not assessed.  FINDINGS  Left  Ventricle: The left ventricle has moderate-severely reduced systolic function, with an ejection fraction of 30-35%. The cavity size was mild to moderately dilated. There is no increase in left ventricular wall thickness. Left ventricular diastolic  Doppler parameters are indeterminate. LVEF is approximately 50 to 55% with apical hypokinesis.  Right Ventricle: The right ventricle has normal systolic function. The cavity was normal. There is no increase in right ventricular wall thickness.  Left Atrium: Left atrial size was severely dilated.  Right Atrium: Right atrial size was mild-moderately dilated. Right atrial pressure is estimated at 15 mmHg.  Interatrial Septum: The interatrial septum was not assessed.  Pericardium: Trivial pericardial effusion is present.  Mitral Valve: The mitral valve is abnormal. Moderate thickening of the mitral valve leaflet. There is mild mitral annular calcification present. Mitral valve regurgitation is mild by color flow Doppler.  Tricuspid Valve: The tricuspid valve is normal in structure. Tricuspid valve regurgitation is mild by color flow Doppler.  Aortic Valve: The aortic valve is tricuspid Mild thickening of the aortic valve. Aortic valve regurgitation is trivial by color flow Doppler.  Pulmonic Valve: The pulmonic valve was grossly normal. Pulmonic valve regurgitation is mild by color flow Doppler.  Aorta: The aortic root is normal in size and structure.  Venous: The inferior vena cava is dilated in size with less than  50% respiratory variability.    +--------------+--------++ LEFT VENTRICLE         +--------------+--------++ PLAX 2D                +--------------+--------++ LVIDd:        5.86 cm  +--------------+--------++ LVIDs:        4.42 cm  +--------------+--------++ LV PW:        1.13 cm  +--------------+--------++ LVOT diam:    1.90 cm  +--------------+--------++ LV SV:        82 ml     +--------------+--------++ LV SV Index:  44.48    +--------------+--------++ LVOT Area:    2.84 cm +--------------+--------++                        +--------------+--------++  +--------------+-----------++ MV E velocity:114.00 cm/s +--------------+-------+ +--------------+-----------++ SHUNTS                MV A velocity:64.00 cm/s  +--------------+-------+ +--------------+-----------++ Systemic Diam:1.90 cm MV E/A ratio: 1.78        +--------------+-------+ +--------------+-----------++    Dorris Carnes MD Electronically signed by Dorris Carnes MD Signature Date/Time: 08/09/2018/7:06:41 PM      Final   DG Chest Portable 1 View CLINICAL DATA:  Worsening shortness of breath over the past week.  EXAM: PORTABLE CHEST 1 VIEW  COMPARISON:  Chest x-ray dated August 01, 2018.  FINDINGS: Unchanged left chest wall pacemaker. Stable cardiomegaly status post CABG and TAVR. New diffusely increased interstitial markings. New small bilateral pleural effusions and left greater than right bibasilar atelectasis. No pneumothorax. No acute osseous abnormality.  IMPRESSION: 1. New mild interstitial pulmonary edema and small bilateral pleural effusions, consistent with congestive heart failure.  Electronically Signed   By: Titus Dubin M.D.   On: 08/09/2018 04:46   Assessment and Plan:  See Problem List for Assessment and Plan of chronic medical problems.   Follow Up Instructions:    I discussed the assessment and treatment plan with the patient. The patient was provided an opportunity to ask questions and all were answered. The patient agreed with the plan and demonstrated an understanding of the instructions.   The patient was advised to call back or seek an in-person evaluation if the symptoms worsen or if the condition fails to improve as anticipated.    Binnie Rail, MD

## 2018-08-18 ENCOUNTER — Telehealth: Payer: Self-pay | Admitting: Internal Medicine

## 2018-08-18 ENCOUNTER — Other Ambulatory Visit: Payer: Self-pay | Admitting: Nephrology

## 2018-08-18 ENCOUNTER — Encounter: Payer: Self-pay | Admitting: Internal Medicine

## 2018-08-18 ENCOUNTER — Ambulatory Visit (INDEPENDENT_AMBULATORY_CARE_PROVIDER_SITE_OTHER): Payer: Medicare Other | Admitting: Internal Medicine

## 2018-08-18 DIAGNOSIS — G4733 Obstructive sleep apnea (adult) (pediatric): Secondary | ICD-10-CM

## 2018-08-18 DIAGNOSIS — N183 Chronic kidney disease, stage 3 unspecified: Secondary | ICD-10-CM

## 2018-08-18 DIAGNOSIS — D649 Anemia, unspecified: Secondary | ICD-10-CM

## 2018-08-18 DIAGNOSIS — I10 Essential (primary) hypertension: Secondary | ICD-10-CM | POA: Diagnosis not present

## 2018-08-18 DIAGNOSIS — J9691 Respiratory failure, unspecified with hypoxia: Secondary | ICD-10-CM | POA: Insufficient documentation

## 2018-08-18 DIAGNOSIS — I480 Paroxysmal atrial fibrillation: Secondary | ICD-10-CM

## 2018-08-18 DIAGNOSIS — J9601 Acute respiratory failure with hypoxia: Secondary | ICD-10-CM | POA: Diagnosis not present

## 2018-08-18 DIAGNOSIS — I5033 Acute on chronic diastolic (congestive) heart failure: Secondary | ICD-10-CM | POA: Diagnosis not present

## 2018-08-18 DIAGNOSIS — N179 Acute kidney failure, unspecified: Secondary | ICD-10-CM

## 2018-08-18 NOTE — Assessment & Plan Note (Signed)
BP well controlled Current regimen effective and well tolerated Continue current medications at current doses  

## 2018-08-18 NOTE — Telephone Encounter (Signed)
Pt wanted to confirm details of Dr. Quay Burow with [facility], so Dr. Caryl Comes is aware of current care plans.

## 2018-08-18 NOTE — Assessment & Plan Note (Addendum)
Has been in atrial fibrillation Dr. Caryl Comes discussed increasing amiodarone and possible cardioversion Taking Xarelto Family will follow-up with Dr. Caryl Comes regarding treatment plan-family seemed confused as if he was supposed to increase the amiodarone or if he would be having a cardioversion

## 2018-08-18 NOTE — Assessment & Plan Note (Signed)
When he arrived to the emergency room he was hypoxic and required 4 L of oxygen via nasal cannula Chest x-ray had interstitial edema Related to acute on chronic diastolic CHF Treated with IV Lasix Oxygenation improved Shortness of breath improved Oxygen saturation at home 92% on room air Family can continue to monitor

## 2018-08-18 NOTE — Assessment & Plan Note (Signed)
Has established with nephrology Kidney function slightly better last week and nephrology appointment

## 2018-08-18 NOTE — Assessment & Plan Note (Signed)
Appears and sounds to be fairly euvolemic Taking furosemide-continue current dose Has follow-up with nephrology in 1 month Following with cardiology

## 2018-08-18 NOTE — Assessment & Plan Note (Signed)
Taking 1 iron pill daily-continue Nephrology did not feel that he but was necessary Will likely be rechecked in 1 month at his follow-up appointment with nephrology Will stay on Xarelto as long as H/H is stable

## 2018-08-18 NOTE — Telephone Encounter (Signed)
Reviewed with pt and his family recommendations from Dr Caryl Comes regarding amiodarone increase. Pt is also scheduled for a DCCV with Dr Oval Linsey on 5/14 as well. Pt's family understands he will be required to have a COVID screening test performed prior to his procedure.   They understand I will contact them back in the AM to discuss pre procedural instructions.

## 2018-08-18 NOTE — Assessment & Plan Note (Signed)
Following with Dr. Eulas Post Nephrology-established last week Will be having ultrasound of his kidneys Continuing current Lasix dose Has nephrology follow-up in 1 month

## 2018-08-18 NOTE — Assessment & Plan Note (Signed)
Using CPAP-often uses it for 4 hours a night

## 2018-08-19 ENCOUNTER — Other Ambulatory Visit (HOSPITAL_COMMUNITY)
Admission: RE | Admit: 2018-08-19 | Discharge: 2018-08-19 | Disposition: A | Discharge status: Home / Self Care | Payer: Medicare Other | Source: Ambulatory Visit | Attending: Internal Medicine | Admitting: Internal Medicine

## 2018-08-19 ENCOUNTER — Other Ambulatory Visit: Payer: Self-pay

## 2018-08-19 ENCOUNTER — Telehealth: Payer: Self-pay | Admitting: Internal Medicine

## 2018-08-19 DIAGNOSIS — Z1159 Encounter for screening for other viral diseases: Secondary | ICD-10-CM | POA: Insufficient documentation

## 2018-08-19 LAB — SARS CORONAVIRUS 2 BY RT PCR (HOSPITAL ORDER, PERFORMED IN ~~LOC~~ HOSPITAL LAB): SARS Coronavirus 2: NEGATIVE

## 2018-08-19 NOTE — Telephone Encounter (Signed)
COVID screening instructions and pre procedural instructions have been given to pt's wife and daughter. A MyChart message with Amiodarone instructions, screening instructions, and pre cardioversion instructions have been written and sent to pt's account as well.   The family has verbalized understanding and will call with any additional questions.

## 2018-08-19 NOTE — Telephone Encounter (Signed)
New message:     Larene Beach from [facility] concering this patient. Please call her at [phone].

## 2018-08-19 NOTE — Telephone Encounter (Signed)
Shannon from endo wanted to be sure pt was ordered a rapid COVID test. I confirmed he was and will be getting called to schedule this morning.

## 2018-08-20 ENCOUNTER — Other Ambulatory Visit: Payer: Self-pay | Admitting: Internal Medicine

## 2018-08-20 LAB — NOVEL CORONAVIRUS, NAA (HOSP ORDER, SEND-OUT TO REF LAB; TAT 18-24 HRS): SARS-CoV-2, NAA: NOT DETECTED

## 2018-08-20 NOTE — Progress Notes (Addendum)
Spoke with pt prior to outpt procedure scheduled tomorrow. Pt denies any current symptoms of cough, shortness of breath or fever.

## 2018-08-20 NOTE — Telephone Encounter (Signed)
Pts wife Berenice Bouton called to request a new RX for the Amiodarone with the short term changes made by Dr. Caryl Comes until 09/16/18.Marland Kitchen New Rx called to Legrand Como at [facility] for enough to cover the changes and then to resume previous RX on 09/16/18.

## 2018-08-20 NOTE — Telephone Encounter (Signed)
Pt's wife calling stating that the pt's medication Amiodarone 200 mg tablet was increased, but this change was not sent in to pt's pharmacy, pt's wife would like for this Rx to be sent to [facility]. Please address

## 2018-08-21 ENCOUNTER — Ambulatory Visit (HOSPITAL_COMMUNITY)
Admission: RE | Admit: 2018-08-21 | Discharge: 2018-08-21 | Disposition: A | Discharge status: Home / Self Care | Payer: Medicare Other | Attending: Cardiovascular Disease | Admitting: Cardiovascular Disease

## 2018-08-21 ENCOUNTER — Other Ambulatory Visit: Payer: Self-pay

## 2018-08-21 ENCOUNTER — Encounter (HOSPITAL_COMMUNITY)
Admission: RE | Disposition: A | Discharge status: Home / Self Care | Payer: Self-pay | Source: Home / Self Care | Attending: Cardiovascular Disease

## 2018-08-21 ENCOUNTER — Ambulatory Visit (HOSPITAL_COMMUNITY): Payer: Medicare Other | Admitting: Anesthesiology

## 2018-08-21 ENCOUNTER — Encounter (HOSPITAL_COMMUNITY): Payer: Self-pay

## 2018-08-21 DIAGNOSIS — I2510 Atherosclerotic heart disease of native coronary artery without angina pectoris: Secondary | ICD-10-CM | POA: Diagnosis not present

## 2018-08-21 DIAGNOSIS — I110 Hypertensive heart disease with heart failure: Secondary | ICD-10-CM | POA: Insufficient documentation

## 2018-08-21 DIAGNOSIS — I350 Nonrheumatic aortic (valve) stenosis: Secondary | ICD-10-CM | POA: Insufficient documentation

## 2018-08-21 DIAGNOSIS — N289 Disorder of kidney and ureter, unspecified: Secondary | ICD-10-CM | POA: Diagnosis not present

## 2018-08-21 DIAGNOSIS — I130 Hypertensive heart and chronic kidney disease with heart failure and stage 1 through stage 4 chronic kidney disease, or unspecified chronic kidney disease: Secondary | ICD-10-CM | POA: Diagnosis not present

## 2018-08-21 DIAGNOSIS — Z1159 Encounter for screening for other viral diseases: Secondary | ICD-10-CM | POA: Diagnosis not present

## 2018-08-21 DIAGNOSIS — Z7901 Long term (current) use of anticoagulants: Secondary | ICD-10-CM | POA: Insufficient documentation

## 2018-08-21 DIAGNOSIS — Z8674 Personal history of sudden cardiac arrest: Secondary | ICD-10-CM | POA: Diagnosis not present

## 2018-08-21 DIAGNOSIS — I471 Supraventricular tachycardia: Secondary | ICD-10-CM | POA: Diagnosis not present

## 2018-08-21 DIAGNOSIS — Z952 Presence of prosthetic heart valve: Secondary | ICD-10-CM | POA: Insufficient documentation

## 2018-08-21 DIAGNOSIS — I252 Old myocardial infarction: Secondary | ICD-10-CM | POA: Insufficient documentation

## 2018-08-21 DIAGNOSIS — Z951 Presence of aortocoronary bypass graft: Secondary | ICD-10-CM | POA: Insufficient documentation

## 2018-08-21 DIAGNOSIS — Z79899 Other long term (current) drug therapy: Secondary | ICD-10-CM | POA: Diagnosis not present

## 2018-08-21 DIAGNOSIS — M1990 Unspecified osteoarthritis, unspecified site: Secondary | ICD-10-CM | POA: Insufficient documentation

## 2018-08-21 DIAGNOSIS — Z8249 Family history of ischemic heart disease and other diseases of the circulatory system: Secondary | ICD-10-CM | POA: Insufficient documentation

## 2018-08-21 DIAGNOSIS — Z881 Allergy status to other antibiotic agents status: Secondary | ICD-10-CM | POA: Insufficient documentation

## 2018-08-21 DIAGNOSIS — Z95810 Presence of automatic (implantable) cardiac defibrillator: Secondary | ICD-10-CM | POA: Insufficient documentation

## 2018-08-21 DIAGNOSIS — I5043 Acute on chronic combined systolic (congestive) and diastolic (congestive) heart failure: Secondary | ICD-10-CM | POA: Diagnosis not present

## 2018-08-21 DIAGNOSIS — I5022 Chronic systolic (congestive) heart failure: Secondary | ICD-10-CM | POA: Diagnosis not present

## 2018-08-21 DIAGNOSIS — I739 Peripheral vascular disease, unspecified: Secondary | ICD-10-CM | POA: Insufficient documentation

## 2018-08-21 DIAGNOSIS — G4733 Obstructive sleep apnea (adult) (pediatric): Secondary | ICD-10-CM | POA: Diagnosis not present

## 2018-08-21 DIAGNOSIS — I4891 Unspecified atrial fibrillation: Secondary | ICD-10-CM | POA: Diagnosis not present

## 2018-08-21 DIAGNOSIS — Z87891 Personal history of nicotine dependence: Secondary | ICD-10-CM | POA: Diagnosis not present

## 2018-08-21 DIAGNOSIS — N183 Chronic kidney disease, stage 3 (moderate): Secondary | ICD-10-CM | POA: Diagnosis not present

## 2018-08-21 DIAGNOSIS — D649 Anemia, unspecified: Secondary | ICD-10-CM | POA: Diagnosis not present

## 2018-08-21 DIAGNOSIS — E785 Hyperlipidemia, unspecified: Secondary | ICD-10-CM | POA: Diagnosis not present

## 2018-08-21 DIAGNOSIS — R600 Localized edema: Secondary | ICD-10-CM | POA: Diagnosis not present

## 2018-08-21 DIAGNOSIS — I255 Ischemic cardiomyopathy: Secondary | ICD-10-CM | POA: Insufficient documentation

## 2018-08-21 HISTORY — PX: CARDIOVERSION: SHX1299

## 2018-08-21 LAB — POCT I-STAT 4, (NA,K, GLUC, HGB,HCT)
Glucose, Bld: 104 mg/dL — ABNORMAL HIGH (ref 70–99)
HCT: 33 % — ABNORMAL LOW (ref 39.0–52.0)
Hemoglobin: 11.2 g/dL — ABNORMAL LOW (ref 13.0–17.0)
Potassium: 3.8 mmol/L (ref 3.5–5.1)
Sodium: 139 mmol/L (ref 135–145)

## 2018-08-21 SURGERY — CARDIOVERSION
Anesthesia: General

## 2018-08-21 MED ORDER — LIDOCAINE HCL (CARDIAC) PF 100 MG/5ML IV SOSY
PREFILLED_SYRINGE | INTRAVENOUS | Status: DC | PRN
  Administered 2018-08-21: 50 mg via INTRATRACHEAL

## 2018-08-21 MED ORDER — SODIUM CHLORIDE 0.9% FLUSH: 3.0000 mL | INTRAVENOUS | Status: DC | PRN

## 2018-08-21 MED ORDER — SODIUM CHLORIDE 0.9% FLUSH: 3.0000 mL | Freq: Two times a day (BID) | INTRAVENOUS | Status: DC

## 2018-08-21 MED ORDER — SODIUM CHLORIDE 0.9 % IV SOLN
250.0000 mL | INTRAVENOUS | Status: DC
  Administered 2018-08-21: 13:00:00 via INTRAVENOUS

## 2018-08-21 MED ORDER — PROPOFOL 10 MG/ML IV BOLUS
INTRAVENOUS | Status: DC | PRN
  Administered 2018-08-21: 50 mg via INTRAVENOUS

## 2018-08-21 NOTE — Telephone Encounter (Signed)
Noted  

## 2018-08-21 NOTE — Anesthesia Preprocedure Evaluation (Signed)
Anesthesia Evaluation  Patient identified by MRN, date of birth, ID band Patient awake    Reviewed: Allergy & Precautions, NPO status , Patient's Chart, lab work & pertinent test results, reviewed documented beta blocker date and time   History of Anesthesia Complications Negative for: history of anesthetic complications  Airway Mallampati: I  TM Distance: >3 FB Neck ROM: Full    Dental  (+) Dental Advisory Given, Teeth Intact   Pulmonary sleep apnea and Continuous Positive Airway Pressure Ventilation , former smoker,    breath sounds clear to auscultation       Cardiovascular hypertension, Pt. on medications and Pt. on home beta blockers + CAD, + Past MI, + CABG, + Peripheral Vascular Disease and +CHF  + Valvular Problems/Murmurs (s/p TAVR)  Rhythm:Irregular Rate:Normal   '19 TTE - LV cavity mildly dilated. EF 40% to 45%. Hypokinesis of the inferolateral myocardium.Ventricular septal motion showed paradox. A stent-valve (TAVR) bioprosthesis was present and functioning normally. Mild MR. Left atrium was mildly dilated. RV cavity size was mildly dilated. Right atrium was moderately dilated.  '19 Cath - Mid LM to Prox LAD lesion is 100% stenosed with 100% stenosed side branch in Ost Cx to Dist Cx. Known CTO SVG-OM graft was visualized by angiography and is large. The graft exhibits minimal luminal irregularities. Origin to Prox Graft stent has 5% in-stent restenosis. Mid Graft to Dist Graft stent has 5% in-stent restenosis. LIMA graft was visualized by angiography and is normal in caliber. The graft exhibits no disease. The graft insertion to the mid LAD. Prox LAD to Mid LAD lesion is 90% stenosed -retrograde from LIMA insertion. (Stable). Mid LAD to Dist LAD lesion is 90% stenosed-antegrade from LIMA insertion. (Stable). Ost RCA to Dist RCA lesion 100% stenosed. Known CTO SVG-dRCA graft was visualized by angiography and is  large. The graft exhibits minimal luminal irregularities. Dist RCA lesion is 40% stenosed at graft insertion site.   Neuro/Psych  Headaches, negative psych ROS   GI/Hepatic Neg liver ROS, hiatal hernia,   Endo/Other  negative endocrine ROS  Renal/GU Renal InsufficiencyRenal disease     Musculoskeletal  (+) Arthritis ,   Abdominal   Peds  Hematology  (+) anemia ,   Anesthesia Other Findings   Reproductive/Obstetrics                             Anesthesia Physical  Anesthesia Plan  ASA: III  Anesthesia Plan: General   Post-op Pain Management:    Induction: Intravenous  PONV Risk Score and Plan: 2 and Treatment may vary due to age or medical condition and Propofol infusion  Airway Management Planned: Mask and Natural Airway  Additional Equipment: None  Intra-op Plan:   Post-operative Plan:   Informed Consent: I have reviewed the patients History and Physical, chart, labs and discussed the procedure including the risks, benefits and alternatives for the proposed anesthesia with the patient or authorized representative who has indicated his/her understanding and acceptance.     Dental advisory given  Plan Discussed with: CRNA  Anesthesia Plan Comments:         Anesthesia Quick Evaluation

## 2018-08-21 NOTE — Transfer of Care (Signed)
Immediate Anesthesia Transfer of Care Note  Patient: [name]  Procedure(s) Performed: CARDIOVERSION (N/A )  Patient Location: PACU and Endoscopy Unit  Anesthesia Type:General  Level of Consciousness: drowsy  Airway & Oxygen Therapy: Patient Spontanous Breathing  Post-op Assessment: Report given to RN and Post -op Vital signs reviewed and stable  Post vital signs: Reviewed and stable  Last Vitals:  Vitals Value Taken Time  BP    Temp    Pulse    Resp    SpO2      Last Pain:  Vitals:   08/21/18 1115  TempSrc: Oral  PainSc: 0-No pain         Complications: No apparent anesthesia complications

## 2018-08-21 NOTE — CV Procedure (Signed)
Electrical Cardioversion Procedure Note [name] [id number] 01/14/1934  Procedure: Electrical Cardioversion Indications:  Atrial Tachycardia  Procedure Details Consent: Risks of procedure as well as the alternatives and risks of each were explained to the (patient/caregiver).  Consent for procedure obtained. Time Out: Verified patient identification, verified procedure, site/side was marked, verified correct patient position, special equipment/implants available, medications/allergies/relevent history reviewed, required imaging and test results available.  Performed  Patient placed on cardiac monitor, pulse oximetry, supplemental oxygen as necessary.  Sedation given: propofol Pacer pads placed anterior and posterior chest.  Cardioverted 1 time(s).  Cardioverted at 150J.  Evaluation Findings: Post procedure EKG shows: APVP Complications: None Patient did tolerate procedure well.   Atrial pacing was first attempted but he remained in atrial tachycardia.  Prior to cardioversion his pacemaker rate was turned down and EKGs were obtained to assist with ablation planning if necessary in the future.   Carlos Latch, MD 08/21/2018, 1:08 PM

## 2018-08-21 NOTE — Anesthesia Postprocedure Evaluation (Signed)
Anesthesia Post Note  Patient: [name]  Procedure(s) Performed: CARDIOVERSION (N/A )     Patient location during evaluation: PACU Anesthesia Type: General Level of consciousness: sedated and patient cooperative Pain management: pain level controlled Vital Signs Assessment: post-procedure vital signs reviewed and stable Respiratory status: spontaneous breathing Cardiovascular status: stable Anesthetic complications: no    Last Vitals:  Vitals:   08/21/18 1256 08/21/18 1300  BP: (!) 102/44 (!) 105/47  Pulse: 62 63  Resp: 19 (!) 25  Temp: 36.7 C   SpO2: 95% 93%    Last Pain:  Vitals:   08/21/18 1256  TempSrc: Oral  PainSc: 0-No pain                 Nolon Nations

## 2018-08-21 NOTE — Anesthesia Procedure Notes (Signed)
Date/Time: 08/21/2018 12:45 PM Performed by: Trinna Post., CRNA Pre-anesthesia Checklist: Patient identified, Emergency Drugs available, Suction available, Patient being monitored and Timeout performed Patient Re-evaluated:Patient Re-evaluated prior to induction Oxygen Delivery Method: Ambu bag Preoxygenation: Pre-oxygenation with 100% oxygen Induction Type: IV induction Placement Confirmation: positive ETCO2

## 2018-08-21 NOTE — Discharge Instructions (Signed)
Electrical Cardioversion, Care After °This sheet gives you information about how to care for yourself after your procedure. Your health care provider may also give you more specific instructions. If you have problems or questions, contact your health care provider. °What can I expect after the procedure? °After the procedure, it is common to have: °· Some redness on the skin where the shocks were given. °Follow these instructions at home: ° °· Do not drive for 24 hours if you were given a medicine to help you relax (sedative). °· Take over-the-counter and prescription medicines only as told by your health care provider. °· Ask your health care provider how to check your pulse. Check it often. °· Rest for 48 hours after the procedure or as told by your health care provider. °· Avoid or limit your caffeine use as told by your health care provider. °Contact a health care provider if: °· You feel like your heart is beating too quickly or your pulse is not regular. °· You have a serious muscle cramp that does not go away. °Get help right away if: ° °· You have discomfort in your chest. °· You are dizzy or you feel faint. °· You have trouble breathing or you are short of breath. °· Your speech is slurred. °· You have trouble moving an arm or leg on one side of your body. °· Your fingers or toes turn cold or blue. °This information is not intended to replace advice given to you by your health care provider. Make sure you discuss any questions you have with your health care provider. °Document Released: 01/14/2013 Document Revised: 10/28/2015 Document Reviewed: 09/30/2015 °Elsevier Interactive Patient Education © 2019 [employer] ° °

## 2018-08-21 NOTE — Interval H&P Note (Signed)
History and Physical Interval Note:  08/21/2018 12:41 PM  [name]  has presented today for surgery, with the diagnosis of ATRIAL FIBRILLATION.  The various methods of treatment have been discussed with the patient and family. After consideration of risks, benefits and other options for treatment, the patient has consented to  Procedure(s): CARDIOVERSION (N/A) as a surgical intervention.  The patient's history has been reviewed, patient examined, no change in status, stable for surgery.  I have reviewed the patient's chart and labs.  Questions were answered to the patient's satisfaction.     Skeet Latch, MD

## 2018-08-22 ENCOUNTER — Encounter (HOSPITAL_COMMUNITY): Payer: Self-pay | Admitting: Cardiovascular Disease

## 2018-08-22 ENCOUNTER — Other Ambulatory Visit: Payer: Self-pay | Admitting: Cardiovascular Disease

## 2018-08-25 ENCOUNTER — Telehealth: Payer: Self-pay

## 2018-08-25 NOTE — Telephone Encounter (Signed)
   TELEPHONE CALL NOTE  This patient has been deemed a candidate for follow-up tele-health visit to limit community exposure during the Covid-19 pandemic. I spoke with the patient via phone to discuss instructions.  A Virtual Office Visit appointment type has been scheduled for 5/27 @ 1:30 pm with Lyda Jester, PA, with "VIDEO" or "TELEPHONE" in the appointment notes - patient prefers Video type.    Frederik Schmidt, RN 08/25/2018 2:31 PM  Patient consented to a Virtual Visit by Video 5/27 @ 1:30 pm with Lyda Jester, PA

## 2018-08-26 ENCOUNTER — Ambulatory Visit
Admission: RE | Admit: 2018-08-26 | Discharge: 2018-08-26 | Disposition: A | Discharge status: Home / Self Care | Payer: Medicare Other | Source: Ambulatory Visit | Attending: Nephrology | Admitting: Nephrology

## 2018-08-26 DIAGNOSIS — N183 Chronic kidney disease, stage 3 unspecified: Secondary | ICD-10-CM

## 2018-08-29 ENCOUNTER — Other Ambulatory Visit: Payer: Self-pay

## 2018-08-29 ENCOUNTER — Encounter (HOSPITAL_COMMUNITY): Payer: Self-pay | Admitting: Emergency Medicine

## 2018-08-29 ENCOUNTER — Ambulatory Visit (HOSPITAL_COMMUNITY)
Admission: EM | Admit: 2018-08-29 | Discharge: 2018-08-29 | Disposition: A | Discharge status: Home / Self Care | Payer: Medicare Other | Attending: Family Medicine | Admitting: Family Medicine

## 2018-08-29 DIAGNOSIS — M79604 Pain in right leg: Secondary | ICD-10-CM | POA: Diagnosis not present

## 2018-08-29 NOTE — ED Notes (Signed)
Patient able to ambulate independently  

## 2018-08-29 NOTE — ED Provider Notes (Signed)
[place]    CSN: [id number] Arrival date & time: 08/29/18  1506     History   Chief Complaint Chief Complaint  Patient presents with  . Leg Swelling    HPI [name] is a 85 y.o. male.   He is presenting with right anterior tibia pain.  The pain is been occurring for roughly 1 day.  The pain is sharp and intermittent in nature.  It seems to be worse with certain movements.  Denies pain above the knee or into the foot.  Has not taken anything for the pain.  He does suffer from heart failure but no excessive edema.  Has recently undergone a procedure for his heart.  Denies any trauma or inciting event.  The pain is sharp in nature.  HPI  Past Medical History:  Diagnosis Date  . Aortic stenosis, severe    s/p TAVR with Edwards Sapien 3 THV (size 26 mm, model # 9600TFX, serial # [id number])  . Arthritis   . Cardiac arrest ([place])   . Chronic systolic CHF (congestive heart failure) ([place]) 01/21/2018  . Coronary artery disease    NSTEMI 10/19  . Essential hypertension   . Headache   . History of hiatal hernia   . Hyperlipidemia   . Kidney stones   . OSA (obstructive sleep apnea) 02/05/2018   Severe obstructive sleep apnea with an AHI of 36.8/h with no significant central sleep apnea.   Now on CPAP at  5 cm H2O  . Peripheral vascular disease ([place])   . S/P cardiac cath 01/22/18  01/23/2018    Patient Active Problem List   Diagnosis Date Noted  . Respiratory failure with hypoxia ([place]) 08/18/2018  . Cardiac arrest ([place])   . Acute on chronic diastolic CHF (congestive heart failure) ([place]) 08/11/2018  . CHF (congestive heart failure) ([place]) 08/10/2018  . Essential hypertension 08/09/2018  . CKD (chronic kidney disease) stage 3, GFR 30-59 ml/min ([facility]) 08/09/2018  . AKI (acute kidney injury) ([place]) 03/03/2018  . Constipation 03/03/2018  . Anemia 03/03/2018  . OSA (obstructive sleep apnea) 02/05/2018  . S/P cardiac cath 01/22/18 stable -treating medically  01/23/2018   . Atrial tachycardia ([place]) 01/22/2018  . Non-ST elevation (NSTEMI) myocardial infarction ([place]) 01/21/2018  . Chronic systolic CHF (congestive heart failure) ([place]) 01/21/2018  . Sleep difficulties 07/29/2015  . Erectile disorder due to medical condition in male patient 12/17/2014  . Severe aortic valve stenosis - s/p  Edwards Sapien 3 THV (size 26 mm, model # 9600TFX, serial # [id number]) 03/23/2014 03/23/2014  . Abnormal nuclear stress test 01/29/2014  . Ischemic cardiomyopathy 2012/01/04  . 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 Back pain 07/10/2010  . Hypercholesteremia 07/10/2010  . Benign hypertensive heart disease without heart failure 07/10/2010  . S/P CABG (coronary artery bypass graft) 1982 and re-do 1994  07/10/2010  . Implantable cardioverter-defibrillator (ICD) in situ 03/30/2010  . Atrial fibrillation ([place]) 12-06-202011    Past Surgical History:  Procedure Laterality Date  . ADENOIDECTOMY    . APPENDECTOMY    . CARDIAC CATHETERIZATION   11/16/2009   . CARDIAC DEFIBRILLATOR PLACEMENT    . CARDIOVERSION N/A 02/24/2018   Procedure: CARDIOVERSION;  Surgeon: Sueanne Margarita, MD;  Location: [facility] ENDOSCOPY;  Service: Cardiovascular;  Laterality: N/A;  . CARDIOVERSION N/A 08/21/2018   Procedure: CARDIOVERSION;  Surgeon: Skeet Latch, MD;  Location: [facility] ENDOSCOPY;  Service: Cardiovascular;  Laterality: N/A;  . cataract surgery      Bilateral cataract surgery  .  CORONARY ARTERY BYPASS GRAFT     in 1982 and '84 with subsequent PTCI  . EP IMPLANTABLE DEVICE N/A 03/21/2016   Procedure: ICD Generator Changeout;  Surgeon: Deboraha Sprang, MD;  Location: [facility] CV LAB;  Service: Cardiovascular;  Laterality: N/A;  . HERNIA REPAIR    . LEFT AND RIGHT HEART CATHETERIZATION WITH CORONARY/GRAFT ANGIOGRAM N/A 02/01/2014   Procedure: LEFT AND RIGHT HEART CATHETERIZATION WITH Beatrix Fetters;  Surgeon: Sinclair Grooms, MD;  Location: [facility] CATH LAB;  Service: Cardiovascular;   Laterality: N/A;  . left elbow tendon surgery    . LEFT HEART CATH AND CORS/GRAFTS ANGIOGRAPHY N/A 01/22/2018   Procedure: LEFT HEART CATH AND CORS/GRAFTS ANGIOGRAPHY;  Surgeon: Leonie Man, MD;  Location: [place] CV LAB;  Service: Cardiovascular;  Laterality: N/A;  . TEE WITHOUT CARDIOVERSION N/A 03/23/2014   Procedure: TRANSESOPHAGEAL ECHOCARDIOGRAM (TEE);  Surgeon: Burnell Blanks, MD;  Location: [facility];  Service: Open Heart Surgery;  Laterality: N/A;  . TONSILLECTOMY    . TRANSCATHETER AORTIC VALVE REPLACEMENT, TRANSFEMORAL N/A 03/23/2014   Procedure: TRANSCATHETER AORTIC VALVE REPLACEMENT, TRANSFEMORAL;  Surgeon: Burnell Blanks, MD;  Location: [facility];  Service: Open Heart Surgery;  Laterality: N/A;       Home Medications    Prior to Admission medications   Medication Sig Start Date End Date Taking? Authorizing Provider  acetaminophen (TYLENOL) 500 MG tablet Take 500 mg by mouth every 6 (six) hours as needed for moderate pain or headache.    [provider]  amiodarone (PACERONE) 200 MG tablet Take 1 tablet (200 mg total) by mouth daily. Patient taking differently: Take 400 mg by mouth 2 (two) times daily.  02/22/18   Lyda Jester M, PA-C  amLODipine (NORVASC) 5 MG tablet TAKE 1 TABLET BY MOUTH DAILY. Patient taking differently: Take 5 mg by mouth daily.  12/23/17   Burnell Blanks, MD  b complex vitamins capsule Take 1 capsule by mouth daily. 08/10/18   Binnie Rail, MD  Calcium Carbonate Antacid (TUMS PO) Take 2 tablets by mouth 2 (two) times daily as needed (indigestion/ acid reflux).    [provider]  carvedilol (COREG) 6.25 MG tablet Take 1 tablet (6.25 mg total) by mouth 2 (two) times daily with a meal for 30 days. 08/11/18 09/10/18  Barb Merino, MD  Coenzyme Q10 (COQ10) 100 MG CAPS Take 100 mg by mouth at bedtime.    [provider]  diazepam (VALIUM) 5 MG tablet TAKE 1 TABLET BY MOUTH ONCE DAILY AS NEEDED Patient  taking differently: Take 5 mg by mouth at bedtime as needed for sedation.  05/19/18   Burnell Blanks, MD  ferrous sulfate 325 (65 FE) MG tablet Take 1 tablet (325 mg total) by mouth daily with breakfast. 08/10/18   Burns, Claudina Lick, MD  furosemide (LASIX) 40 MG tablet TAKE 1 TABLET (40 MG TOTAL) BY MOUTH DAILY. Patient taking differently: Take 40 mg by mouth 2 (two) times daily.  07/31/18   Burnell Blanks, MD  Krill Oil 500 MG CAPS Take 500 mg by mouth at bedtime.    [provider]  Magnesium 400 MG CAPS Take 400 mg by mouth at bedtime.    [provider]  Melatonin 1 MG TABS Take 3 mg by mouth at bedtime as needed (sleep).     [provider]  Multiple Vitamin (MULTIVITAMIN WITH MINERALS) TABS tablet Take 1 tablet by mouth daily.    [provider]  nitroGLYCERIN (NITROSTAT) 0.4 MG SL tablet Place 1 tablet (0.4 mg total) under the tongue every 5 (five) minutes as needed for chest pain (for chest pain (MAX of 3 doses)). 04/12/15   Burnell Blanks, MD  Potassium 99 MG TABS Take 99 mg by mouth 2 (two) times daily.    [provider]  Propylene Glycol (SYSTANE COMPLETE) 0.6 % SOLN Place 1 drop into both eyes daily as needed (irritation).    [provider]  rosuvastatin (CRESTOR) 40 MG tablet TAKE 1 TABLET BY MOUTH DAILY. Patient taking differently: Take 40 mg by mouth daily with supper.  12/23/17   Burnell Blanks, MD  senna (SENOKOT) 8.6 MG TABS tablet Take 1 tablet by mouth daily as needed for mild constipation.    [provider]  Thiamine HCl (B-1) 100 MG TABS Take 100 mg by mouth at bedtime.    [provider]  trospium (SANCTURA) 20 MG tablet Take 20 mg by mouth 2 (two) times daily.    [provider]  XARELTO 15 MG TABS tablet TAKE 1 TABLET (15 MG TOTAL) BY MOUTH DAILY WITH SUPPER. 08/22/18   Deboraha Sprang, MD    Family History Family History  Problem Relation Age of Onset  . Heart  attack Father 68  . Coronary artery disease Other        family history is dignificant for early CAD in several members    Social History Social History   Tobacco Use  . Smoking status: Former Smoker    Last attempt to quit: 10/25/1952    Years since quitting: 65.8  . Smokeless tobacco: Never Used  Substance Use Topics  . Alcohol use: Yes    Alcohol/week: 5.0 - 6.0 standard drinks    Types: 5 - 6 Glasses of wine per week    Comment: 5-6 times a week  . Drug use: No     Allergies   Cyclobenzaprine hcl and Tetracycline   Review of Systems Review of Systems  Constitutional: Negative for fever.  HENT: Negative for congestion.   Respiratory: Negative for cough.   Cardiovascular: Positive for leg swelling. Negative for chest pain.  Gastrointestinal: Negative for abdominal pain.  Musculoskeletal: Positive for arthralgias.  Skin: Negative for color change.  Neurological: Negative for weakness.  Hematological: Negative for adenopathy.     Physical Exam Triage Vital Signs ED Triage Vitals  Enc Vitals Group     BP 08/29/18 1520 (!) 102/58     Pulse Rate 08/29/18 1520 91     Resp 08/29/18 1520 18     Temp 08/29/18 1520 98 F (36.7 C)     Temp Source 08/29/18 1520 Oral     SpO2 08/29/18 1520 98 %     Weight --      Height --      Head Circumference --      Peak Flow --      Pain Score 08/29/18 1521 0     Pain Loc --      Pain Edu? --      Excl. in [place]? --    No data found.  Updated Vital Signs BP (!) 102/58 (BP Location: Right Arm)   Pulse 91   Temp 98 F (36.7 C) (Oral)   Resp 18   SpO2 98%   Visual Acuity Right Eye Distance:   Left Eye Distance:   Bilateral Distance:    Right Eye Near:   Left Eye Near:  Bilateral Near:     Physical Exam Gen: NAD, alert, cooperative with exam,  ENT: normal lips, normal nasal mucosa,  Eye: normal EOM, normal conjunctiva and lids CV: trace edema, +2 pedal pulses   Resp: no accessory muscle use, non-labored,   Skin: no rashes, no areas of induration  Neuro: normal tone, normal sensation to touch Psych:  normal insight, alert and oriented MSK:  Right leg:  Normal knee ROM  Normal ankle ROM  No ecchymosis  No significant tenderness of the calf or anterior tibia Neurovascularly intact   UC Treatments / Results  Labs (all labs ordered are listed, but only abnormal results are displayed) Labs Reviewed - No data to display  EKG None  Radiology No results found.  Procedures Procedures (including critical care time)  Medications Ordered in UC Medications - No data to display  Initial Impression / Assessment and Plan / UC Course  I have reviewed the triage vital signs and the nursing notes.  Pertinent labs & imaging results that were available during my care of the patient were reviewed by me and considered in my medical decision making (see chart for details).     Jaaron is an 85 year old male that is presenting with right anterior tibial pain.  Does not appear to be related to a clot at all.  No suggestion of a heart failure exacerbation.  Has been walking a bit more so could be related to shin splint.  Counseled on supportive care.  Given indications to follow-up and return.   Final Clinical Impressions(s) / UC Diagnoses   Final diagnoses:  Right leg pain     Discharge Instructions     Please try ice or compression on the leg  Please try tylenol  Please follow up if your symptoms fail to improve.     ED Prescriptions    None     Controlled Substance Prescriptions [place] Controlled Substance Registry consulted? Not Applicable   Rosemarie Ax, MD 08/29/18 [phone]

## 2018-08-29 NOTE — Discharge Instructions (Signed)
Please try ice or compression on the leg  Please try tylenol  Please follow up if your symptoms fail to improve.

## 2018-08-29 NOTE — ED Triage Notes (Signed)
Pt here for leg swelling x several days with pain at times

## 2018-08-30 ENCOUNTER — Telehealth: Payer: Self-pay | Admitting: Nurse Practitioner

## 2018-08-30 NOTE — Telephone Encounter (Signed)
   Pt called to report that he's noted HRs in the low 90's the past 2 days.  He is asymptomatic.  His wife checked his pulse and said it was regular (recent DCCV 5/14).  He has been compliant w/ home meds including amio/xarelto.  No c/p, dyspnea, edema.  Just seen at Urgent Care yesterday for leg pain.  HR 91, but no ecg done and exam in ED provider note does not provide detail re: rhythm.  He has sent in a remote transmission.  I advised that our team will have a look at that on Tuesday and so long as he is feeling well, he should cont current meds and f/u as planned on 5/27.  If he has any worsening of symptoms, he should call back.  Caller verbalized understanding and was grateful for the call back.  Murray Hodgkins, NP 08/30/2018, 12:33 PM

## 2018-09-01 ENCOUNTER — Other Ambulatory Visit: Payer: Self-pay | Admitting: Internal Medicine

## 2018-09-02 ENCOUNTER — Inpatient Hospital Stay (HOSPITAL_COMMUNITY)
Admission: EM | Admit: 2018-09-02 | Discharge: 2018-09-06 | DRG: 291 | Disposition: A | Discharge status: Home / Self Care | Payer: Medicare Other | Attending: Cardiology | Admitting: Cardiology

## 2018-09-02 ENCOUNTER — Telehealth: Payer: Self-pay | Admitting: Cardiovascular Disease

## 2018-09-02 ENCOUNTER — Other Ambulatory Visit: Payer: Self-pay

## 2018-09-02 ENCOUNTER — Emergency Department (HOSPITAL_COMMUNITY): Payer: Medicare Other

## 2018-09-02 DIAGNOSIS — R0689 Other abnormalities of breathing: Secondary | ICD-10-CM | POA: Diagnosis not present

## 2018-09-02 DIAGNOSIS — Z1159 Encounter for screening for other viral diseases: Secondary | ICD-10-CM | POA: Diagnosis not present

## 2018-09-02 DIAGNOSIS — I739 Peripheral vascular disease, unspecified: Secondary | ICD-10-CM | POA: Diagnosis present

## 2018-09-02 DIAGNOSIS — Z951 Presence of aortocoronary bypass graft: Secondary | ICD-10-CM

## 2018-09-02 DIAGNOSIS — I5043 Acute on chronic combined systolic (congestive) and diastolic (congestive) heart failure: Secondary | ICD-10-CM | POA: Diagnosis not present

## 2018-09-02 DIAGNOSIS — N179 Acute kidney failure, unspecified: Secondary | ICD-10-CM | POA: Diagnosis not present

## 2018-09-02 DIAGNOSIS — Z95810 Presence of automatic (implantable) cardiac defibrillator: Secondary | ICD-10-CM

## 2018-09-02 DIAGNOSIS — I255 Ischemic cardiomyopathy: Secondary | ICD-10-CM | POA: Diagnosis present

## 2018-09-02 DIAGNOSIS — I480 Paroxysmal atrial fibrillation: Secondary | ICD-10-CM

## 2018-09-02 DIAGNOSIS — Z8249 Family history of ischemic heart disease and other diseases of the circulatory system: Secondary | ICD-10-CM

## 2018-09-02 DIAGNOSIS — D631 Anemia in chronic kidney disease: Secondary | ICD-10-CM | POA: Diagnosis present

## 2018-09-02 DIAGNOSIS — E785 Hyperlipidemia, unspecified: Secondary | ICD-10-CM | POA: Diagnosis not present

## 2018-09-02 DIAGNOSIS — G4733 Obstructive sleep apnea (adult) (pediatric): Secondary | ICD-10-CM | POA: Diagnosis present

## 2018-09-02 DIAGNOSIS — I248 Other forms of acute ischemic heart disease: Secondary | ICD-10-CM | POA: Diagnosis not present

## 2018-09-02 DIAGNOSIS — Z79899 Other long term (current) drug therapy: Secondary | ICD-10-CM | POA: Diagnosis not present

## 2018-09-02 DIAGNOSIS — I5023 Acute on chronic systolic (congestive) heart failure: Secondary | ICD-10-CM | POA: Diagnosis not present

## 2018-09-02 DIAGNOSIS — I252 Old myocardial infarction: Secondary | ICD-10-CM | POA: Diagnosis not present

## 2018-09-02 DIAGNOSIS — I2510 Atherosclerotic heart disease of native coronary artery without angina pectoris: Secondary | ICD-10-CM | POA: Diagnosis present

## 2018-09-02 DIAGNOSIS — I130 Hypertensive heart and chronic kidney disease with heart failure and stage 1 through stage 4 chronic kidney disease, or unspecified chronic kidney disease: Principal | ICD-10-CM | POA: Diagnosis present

## 2018-09-02 DIAGNOSIS — I5040 Unspecified combined systolic (congestive) and diastolic (congestive) heart failure: Secondary | ICD-10-CM | POA: Diagnosis not present

## 2018-09-02 DIAGNOSIS — I4821 Permanent atrial fibrillation: Secondary | ICD-10-CM | POA: Diagnosis present

## 2018-09-02 DIAGNOSIS — R0602 Shortness of breath: Secondary | ICD-10-CM | POA: Diagnosis not present

## 2018-09-02 DIAGNOSIS — I110 Hypertensive heart disease with heart failure: Secondary | ICD-10-CM | POA: Diagnosis not present

## 2018-09-02 DIAGNOSIS — R05 Cough: Secondary | ICD-10-CM | POA: Diagnosis not present

## 2018-09-02 DIAGNOSIS — R0902 Hypoxemia: Secondary | ICD-10-CM | POA: Diagnosis not present

## 2018-09-02 DIAGNOSIS — N189 Chronic kidney disease, unspecified: Secondary | ICD-10-CM | POA: Diagnosis not present

## 2018-09-02 DIAGNOSIS — I350 Nonrheumatic aortic (valve) stenosis: Secondary | ICD-10-CM | POA: Diagnosis present

## 2018-09-02 DIAGNOSIS — I4819 Other persistent atrial fibrillation: Secondary | ICD-10-CM | POA: Diagnosis not present

## 2018-09-02 DIAGNOSIS — I509 Heart failure, unspecified: Secondary | ICD-10-CM

## 2018-09-02 DIAGNOSIS — Z952 Presence of prosthetic heart valve: Secondary | ICD-10-CM

## 2018-09-02 DIAGNOSIS — I5033 Acute on chronic diastolic (congestive) heart failure: Secondary | ICD-10-CM | POA: Diagnosis not present

## 2018-09-02 DIAGNOSIS — N183 Chronic kidney disease, stage 3 unspecified: Secondary | ICD-10-CM

## 2018-09-02 DIAGNOSIS — R339 Retention of urine, unspecified: Secondary | ICD-10-CM | POA: Diagnosis not present

## 2018-09-02 DIAGNOSIS — R001 Bradycardia, unspecified: Secondary | ICD-10-CM | POA: Diagnosis not present

## 2018-09-02 DIAGNOSIS — Z7901 Long term (current) use of anticoagulants: Secondary | ICD-10-CM

## 2018-09-02 DIAGNOSIS — Z87891 Personal history of nicotine dependence: Secondary | ICD-10-CM

## 2018-09-02 DIAGNOSIS — Z20828 Contact with and (suspected) exposure to other viral communicable diseases: Secondary | ICD-10-CM | POA: Diagnosis not present

## 2018-09-02 LAB — CBC WITH DIFFERENTIAL/PLATELET
Abs Immature Granulocytes: 0.04 10*3/uL (ref 0.00–0.07)
Abs Immature Granulocytes: 0.03 10*3/uL (ref 0.00–0.07)
Basophils Absolute: 0 10*3/uL (ref 0.0–0.1)
Basophils Absolute: 0 10*3/uL (ref 0.0–0.1)
Basophils Relative: 0 %
Basophils Relative: 0 %
Eosinophils Absolute: 0 10*3/uL (ref 0.0–0.5)
Eosinophils Absolute: 0 10*3/uL (ref 0.0–0.5)
Eosinophils Relative: 0 %
Eosinophils Relative: 0 %
HCT: 34.6 % — ABNORMAL LOW (ref 39.0–52.0)
HCT: 32.1 % — ABNORMAL LOW (ref 39.0–52.0)
Hemoglobin: 10.2 g/dL — ABNORMAL LOW (ref 13.0–17.0)
Hemoglobin: 9.9 g/dL — ABNORMAL LOW (ref 13.0–17.0)
Immature Granulocytes: 0 %
Immature Granulocytes: 0 %
Lymphocytes Relative: 7 %
Lymphocytes Relative: 13 %
Lymphs Abs: 0.7 10*3/uL (ref 0.7–4.0)
Lymphs Abs: 1.1 10*3/uL (ref 0.7–4.0)
MCH: 22.8 pg — ABNORMAL LOW (ref 26.0–34.0)
MCH: 23.5 pg — ABNORMAL LOW (ref 26.0–34.0)
MCHC: 29.5 g/dL — ABNORMAL LOW (ref 30.0–36.0)
MCHC: 30.8 g/dL (ref 30.0–36.0)
MCV: 77.4 fL — ABNORMAL LOW (ref 80.0–100.0)
MCV: 76.2 fL — ABNORMAL LOW (ref 80.0–100.0)
Monocytes Absolute: 0.7 10*3/uL (ref 0.1–1.0)
Monocytes Absolute: 0.9 10*3/uL (ref 0.1–1.0)
Monocytes Relative: 8 %
Monocytes Relative: 12 %
Neutro Abs: 7.7 10*3/uL (ref 1.7–7.7)
Neutro Abs: 6 10*3/uL (ref 1.7–7.7)
Neutrophils Relative %: 85 %
Neutrophils Relative %: 75 %
Platelets: 145 10*3/uL — ABNORMAL LOW (ref 150–400)
Platelets: 128 10*3/uL — ABNORMAL LOW (ref 150–400)
RBC: 4.47 MIL/uL (ref 4.22–5.81)
RBC: 4.21 MIL/uL — ABNORMAL LOW (ref 4.22–5.81)
RDW: 29.8 % — ABNORMAL HIGH (ref 11.5–15.5)
RDW: 29.8 % — ABNORMAL HIGH (ref 11.5–15.5)
WBC: 9.2 10*3/uL (ref 4.0–10.5)
WBC: 8.1 10*3/uL (ref 4.0–10.5)
nRBC: 0 % (ref 0.0–0.2)
nRBC: 0.2 % (ref 0.0–0.2)

## 2018-09-02 LAB — IRON AND TIBC
Iron: 19 ug/dL — ABNORMAL LOW (ref 45–182)
Saturation Ratios: 5 % — ABNORMAL LOW (ref 17.9–39.5)
TIBC: 375 ug/dL (ref 250–450)
UIBC: 356 ug/dL

## 2018-09-02 LAB — COMPREHENSIVE METABOLIC PANEL WITH GFR
ALT: 37 U/L (ref 0–44)
AST: 51 U/L — ABNORMAL HIGH (ref 15–41)
Albumin: 3.8 g/dL (ref 3.5–5.0)
Alkaline Phosphatase: 58 U/L (ref 38–126)
Anion gap: 14 (ref 5–15)
BUN: 36 mg/dL — ABNORMAL HIGH (ref 8–23)
CO2: 21 mmol/L — ABNORMAL LOW (ref 22–32)
Calcium: 10.8 mg/dL — ABNORMAL HIGH (ref 8.9–10.3)
Chloride: 104 mmol/L (ref 98–111)
Creatinine, Ser: 2.28 mg/dL — ABNORMAL HIGH (ref 0.61–1.24)
GFR calc Af Amer: 30 mL/min — ABNORMAL LOW
GFR calc non Af Amer: 26 mL/min — ABNORMAL LOW
Glucose, Bld: 159 mg/dL — ABNORMAL HIGH (ref 70–99)
Potassium: 4.6 mmol/L (ref 3.5–5.1)
Sodium: 139 mmol/L (ref 135–145)
Total Bilirubin: 0.8 mg/dL (ref 0.3–1.2)
Total Protein: 6.5 g/dL (ref 6.5–8.1)

## 2018-09-02 LAB — POCT I-STAT 7, (LYTES, BLD GAS, ICA,H+H)
Acid-base deficit: 1 mmol/L (ref 0.0–2.0)
Bicarbonate: 21.7 mmol/L (ref 20.0–28.0)
Calcium, Ion: 1.38 mmol/L (ref 1.15–1.40)
HCT: 34 % — ABNORMAL LOW (ref 39.0–52.0)
Hemoglobin: 11.6 g/dL — ABNORMAL LOW (ref 13.0–17.0)
O2 Saturation: 93 %
Patient temperature: 98.8
Potassium: 4.6 mmol/L (ref 3.5–5.1)
Sodium: 137 mmol/L (ref 135–145)
TCO2: 23 mmol/L (ref 22–32)
pCO2 arterial: 29.9 mmHg — ABNORMAL LOW (ref 32.0–48.0)
pH, Arterial: 7.469 — ABNORMAL HIGH (ref 7.350–7.450)
pO2, Arterial: 62 mmHg — ABNORMAL LOW (ref 83.0–108.0)

## 2018-09-02 LAB — CREATININE, SERUM
Creatinine, Ser: 2.35 mg/dL — ABNORMAL HIGH (ref 0.61–1.24)
GFR calc Af Amer: 29 mL/min — ABNORMAL LOW (ref >60)
GFR calc non Af Amer: 25 mL/min — ABNORMAL LOW (ref >60)

## 2018-09-02 LAB — SEDIMENTATION RATE: Sed Rate: 20 mm/hr — ABNORMAL HIGH (ref 0–16)

## 2018-09-02 LAB — FERRITIN: Ferritin: 42 ng/mL (ref 24–336)

## 2018-09-02 LAB — BRAIN NATRIURETIC PEPTIDE: B Natriuretic Peptide: 2559 pg/mL — ABNORMAL HIGH (ref 0.0–100.0)

## 2018-09-02 LAB — SARS CORONAVIRUS 2 BY RT PCR (HOSPITAL ORDER, PERFORMED IN ~~LOC~~ HOSPITAL LAB): SARS Coronavirus 2: NEGATIVE

## 2018-09-02 LAB — TROPONIN I: Troponin I: 5.65 ng/mL (ref <0.03)

## 2018-09-02 MED ORDER — CARVEDILOL 6.25 MG PO TABS
6.2500 mg | ORAL_TABLET | Freq: Two times a day (BID) | ORAL | Status: DC
  Administered 2018-09-02 – 2018-09-06 (×8): 6.25 mg via ORAL
  Filled 2018-09-02 (×8): qty 1

## 2018-09-02 MED ORDER — AEROCHAMBER PLUS FLO-VU LARGE MISC
Status: AC
  Filled 2018-09-02: qty 1

## 2018-09-02 MED ORDER — AMLODIPINE BESYLATE 5 MG PO TABS
5.0000 mg | ORAL_TABLET | Freq: Every day | ORAL | Status: DC
  Administered 2018-09-02 – 2018-09-06 (×5): 5 mg via ORAL
  Filled 2018-09-02 (×5): qty 1

## 2018-09-02 MED ORDER — FUROSEMIDE 10 MG/ML IJ SOLN
40.0000 mg | Freq: Two times a day (BID) | INTRAMUSCULAR | Status: DC
  Administered 2018-09-02 – 2018-09-03 (×2): 40 mg via INTRAVENOUS
  Filled 2018-09-02 (×2): qty 4

## 2018-09-02 MED ORDER — SODIUM CHLORIDE 0.9% FLUSH: 3.0000 mL | INTRAVENOUS | Status: DC | PRN

## 2018-09-02 MED ORDER — SODIUM CHLORIDE 0.9 % IV SOLN: 250.0000 mL | INTRAVENOUS | Status: DC | PRN

## 2018-09-02 MED ORDER — SODIUM CHLORIDE 0.9% FLUSH
3.0000 mL | Freq: Two times a day (BID) | INTRAVENOUS | Status: DC
  Administered 2018-09-02 – 2018-09-04 (×5): 3 mL via INTRAVENOUS

## 2018-09-02 MED ORDER — HEPARIN SODIUM (PORCINE) 5000 UNIT/ML IJ SOLN: 5000.0000 [IU] | Freq: Three times a day (TID) | INTRAMUSCULAR | Status: DC

## 2018-09-02 MED ORDER — AEROCHAMBER PLUS FLO-VU LARGE MISC
1.0000 | Freq: Once | Status: AC
  Administered 2018-09-02: 1

## 2018-09-02 MED ORDER — ROSUVASTATIN CALCIUM 20 MG PO TABS
40.0000 mg | ORAL_TABLET | Freq: Every day | ORAL | Status: DC
  Administered 2018-09-02 – 2018-09-06 (×5): 40 mg via ORAL
  Filled 2018-09-02 (×5): qty 2

## 2018-09-02 MED ORDER — ALBUTEROL SULFATE HFA 108 (90 BASE) MCG/ACT IN AERS
4.0000 | INHALATION_SPRAY | Freq: Once | RESPIRATORY_TRACT | Status: AC
  Administered 2018-09-02: 4 via RESPIRATORY_TRACT
  Filled 2018-09-02: qty 6.7

## 2018-09-02 MED ORDER — FUROSEMIDE 10 MG/ML IJ SOLN
40.0000 mg | Freq: Once | INTRAMUSCULAR | Status: AC
  Administered 2018-09-02: 40 mg via INTRAVENOUS
  Filled 2018-09-02: qty 4

## 2018-09-02 MED ORDER — AMIODARONE HCL 200 MG PO TABS
200.0000 mg | ORAL_TABLET | Freq: Every day | ORAL | Status: DC
  Administered 2018-09-02 – 2018-09-06 (×5): 200 mg via ORAL
  Filled 2018-09-02 (×5): qty 1

## 2018-09-02 MED ORDER — RIVAROXABAN 15 MG PO TABS
15.0000 mg | ORAL_TABLET | Freq: Every day | ORAL | Status: DC
  Administered 2018-09-02 – 2018-09-05 (×4): 15 mg via ORAL
  Filled 2018-09-02 (×4): qty 1

## 2018-09-02 MED ORDER — ONDANSETRON HCL 4 MG/2ML IJ SOLN: 4.0000 mg | Freq: Four times a day (QID) | INTRAMUSCULAR | Status: DC | PRN

## 2018-09-02 MED ORDER — B COMPLEX VITAMINS PO CAPS: 1.0000 | ORAL_CAPSULE | Freq: Every day | ORAL | 0 refills | Status: AC

## 2018-09-02 MED ORDER — ACETAMINOPHEN 325 MG PO TABS: 650.0000 mg | ORAL_TABLET | ORAL | Status: DC | PRN

## 2018-09-02 MED ORDER — ASPIRIN 81 MG PO CHEW
324.0000 mg | CHEWABLE_TABLET | Freq: Once | ORAL | Status: AC
  Administered 2018-09-02: 324 mg via ORAL
  Filled 2018-09-02: qty 4

## 2018-09-02 NOTE — Progress Notes (Signed)
Spoke to renal MD in the ED. States he does not want a PICC or midline placed in this patient at this time due to low CrCL 20g 1.5in PIV started in the upper arm. [employer]

## 2018-09-02 NOTE — ED Notes (Addendum)
Attempted to give report. Floor refused pt due to the high flow O2. (15L) Attempting to wean pt off )o2.

## 2018-09-02 NOTE — ED Notes (Signed)
ED TO INPATIENT HANDOFF REPORT  ED Nurse Name and Phone #:   S Name/Age/Gender [name] 85 y.o. male Room/Bed: 030C/030C  Code Status   Code Status: Prior  Home/SNF/Other Home Patient oriented to: self, place, time and situation Is this baseline? Yes   Triage Complete: Triage complete  Chief Complaint SOB  Triage Note Pt presents with SOB and cough starting yesterday, denies any CP, N/V or edema.  Pt alaert and able to speak full sentences.       Allergies Allergies  Allergen Reactions  . Cyclobenzaprine Hcl Rash  . Tetracycline Rash         Level of Care/Admitting Diagnosis ED Disposition    ED Disposition Condition Comment   Admit  Hospital Area: [facility] [[id number]]  Level of Care: Telemetry Cardiac [103]  Covid Evaluation: N/A  Diagnosis: CHF (congestive heart failure) [facility]) [[id number]]  Admitting Physician: Martinique, PETER M [[id number]]  Attending Physician: Martinique, PETER M [[id number]]  PT Class (Do Not Modify): Observation [104]  PT Acc Code (Do Not Modify): Observation [10022]       B Medical/Surgery History Past Medical History:  Diagnosis Date  . Aortic stenosis, severe    s/p TAVR with Edwards Sapien 3 THV (size 26 mm, model # 9600TFX, serial # [id number])  . Arthritis   . Cardiac arrest ([place])   . Chronic systolic CHF (congestive heart failure) ([place]) 01/21/2018  . Coronary artery disease    NSTEMI 10/19  . Essential hypertension   . Headache   . History of hiatal hernia   . Hyperlipidemia   . Kidney stones   . OSA (obstructive sleep apnea) 02/05/2018   Severe obstructive sleep apnea with an AHI of 36.8/h with no significant central sleep apnea.   Now on CPAP at  5 cm H2O  . Peripheral vascular disease ([address])   . S/P cardiac cath 01/22/18  01/23/2018   Past Surgical History:  Procedure Laterality Date  . ADENOIDECTOMY    . APPENDECTOMY    . CARDIAC CATHETERIZATION   11/16/2009   . CARDIAC DEFIBRILLATOR PLACEMENT    .  CARDIOVERSION N/A 02/24/2018   Procedure: CARDIOVERSION;  Surgeon: Sueanne Margarita, MD;  Location: [facility] ENDOSCOPY;  Service: Cardiovascular;  Laterality: N/A;  . CARDIOVERSION N/A 08/21/2018   Procedure: CARDIOVERSION;  Surgeon: Skeet Latch, MD;  Location: [facility] ENDOSCOPY;  Service: Cardiovascular;  Laterality: N/A;  . cataract surgery      Bilateral cataract surgery  . CORONARY ARTERY BYPASS GRAFT     in 1982 and '84 with subsequent PTCI  . EP IMPLANTABLE DEVICE N/A 03/21/2016   Procedure: ICD Generator Changeout;  Surgeon: Deboraha Sprang, MD;  Location: [place] CV LAB;  Service: Cardiovascular;  Laterality: N/A;  . HERNIA REPAIR    . LEFT AND RIGHT HEART CATHETERIZATION WITH CORONARY/GRAFT ANGIOGRAM N/A 02/01/2014   Procedure: LEFT AND RIGHT HEART CATHETERIZATION WITH Beatrix Fetters;  Surgeon: Sinclair Grooms, MD;  Location: [facility] CATH LAB;  Service: Cardiovascular;  Laterality: N/A;  . left elbow tendon surgery    . LEFT HEART CATH AND CORS/GRAFTS ANGIOGRAPHY N/A 01/22/2018   Procedure: LEFT HEART CATH AND CORS/GRAFTS ANGIOGRAPHY;  Surgeon: Leonie Man, MD;  Location: [place] CV LAB;  Service: Cardiovascular;  Laterality: N/A;  . TEE WITHOUT CARDIOVERSION N/A 03/23/2014   Procedure: TRANSESOPHAGEAL ECHOCARDIOGRAM (TEE);  Surgeon: Burnell Blanks, MD;  Location: [place];  Service: Open Heart Surgery;  Laterality: N/A;  . TONSILLECTOMY    .  TRANSCATHETER AORTIC VALVE REPLACEMENT, TRANSFEMORAL N/A 03/23/2014   Procedure: TRANSCATHETER AORTIC VALVE REPLACEMENT, TRANSFEMORAL;  Surgeon: Burnell Blanks, MD;  Location: [place];  Service: Open Heart Surgery;  Laterality: N/A;     A IV Location/Drains/Wounds Patient Lines/Drains/Airways Status   Active Line/Drains/Airways    Name:   Placement date:   Placement time:   Site:   Days:   Peripheral IV 09/02/18 Left Hand   09/02/18    1111    Hand   less than 1          Intake/Output Last 24 hours No intake or  output data in the 24 hours ending 09/02/18 1511  Labs/Imaging Results for orders placed or performed during the hospital encounter of 09/02/18 (from the past 48 hour(s))  Brain natriuretic peptide     Status: Abnormal   Collection Time: 09/02/18 10:04 AM  Result Value Ref Range   B Natriuretic Peptide 2,559.0 (H) 0.0 - 100.0 pg/mL    Comment: Performed at [facility], [place] [address], [place], [place] [address]  I-STAT 7, (LYTES, BLD GAS, ICA, H+H)     Status: Abnormal   Collection Time: 09/02/18 10:31 AM  Result Value Ref Range   pH, Arterial 7.469 (H) 7.350 - 7.450   pCO2 arterial 29.9 (L) 32.0 - 48.0 mmHg   pO2, Arterial 62.0 (L) 83.0 - 108.0 mmHg   Bicarbonate 21.7 20.0 - 28.0 mmol/L   TCO2 23 22 - 32 mmol/L   O2 Saturation 93.0 %   Acid-base deficit 1.0 0.0 - 2.0 mmol/L   Sodium 137 135 - 145 mmol/L   Potassium 4.6 3.5 - 5.1 mmol/L   Calcium, Ion 1.38 1.15 - 1.40 mmol/L   HCT 34.0 (L) 39.0 - 52.0 %   Hemoglobin 11.6 (L) 13.0 - 17.0 g/dL   Patient temperature 98.8 F    Collection site RADIAL, ALLEN'S TEST ACCEPTABLE    Drawn by Operator    Sample type ARTERIAL   CBC with Differential     Status: Abnormal   Collection Time: 09/02/18 10:46 AM  Result Value Ref Range   WBC 9.2 4.0 - 10.5 K/uL   RBC 4.47 4.22 - 5.81 MIL/uL   Hemoglobin 10.2 (L) 13.0 - 17.0 g/dL   HCT 34.6 (L) 39.0 - 52.0 %   MCV 77.4 (L) 80.0 - 100.0 fL   MCH 22.8 (L) 26.0 - 34.0 pg   MCHC 29.5 (L) 30.0 - 36.0 g/dL   RDW 29.8 (H) 11.5 - 15.5 %   Platelets 145 (L) 150 - 400 K/uL    Comment: REPEATED TO VERIFY   nRBC 0.0 0.0 - 0.2 %   Neutrophils Relative % 85 %   Neutro Abs 7.7 1.7 - 7.7 K/uL   Lymphocytes Relative 7 %   Lymphs Abs 0.7 0.7 - 4.0 K/uL   Monocytes Relative 8 %   Monocytes Absolute 0.7 0.1 - 1.0 K/uL   Eosinophils Relative 0 %   Eosinophils Absolute 0.0 0.0 - 0.5 K/uL   Basophils Relative 0 %   Basophils Absolute 0.0 0.0 - 0.1 K/uL   Immature Granulocytes 0 %   Abs Immature  Granulocytes 0.04 0.00 - 0.07 K/uL   Polychromasia PRESENT     Comment: Performed at [facility], [place] [address] [place], [place] [address]  Comprehensive metabolic panel     Status: Abnormal   Collection Time: 09/02/18 10:46 AM  Result Value Ref Range   Sodium 139 135 - 145  mmol/L   Potassium 4.6 3.5 - 5.1 mmol/L   Chloride 104 98 - 111 mmol/L   CO2 21 (L) 22 - 32 mmol/L   Glucose, Bld 159 (H) 70 - 99 mg/dL   BUN 36 (H) 8 - 23 mg/dL   Creatinine, Ser 2.28 (H) 0.61 - 1.24 mg/dL   Calcium 10.8 (H) 8.9 - 10.3 mg/dL   Total Protein 6.5 6.5 - 8.1 g/dL   Albumin 3.8 3.5 - 5.0 g/dL   AST 51 (H) 15 - 41 U/L   ALT 37 0 - 44 U/L   Alkaline Phosphatase 58 38 - 126 U/L   Total Bilirubin 0.8 0.3 - 1.2 mg/dL   GFR calc non Af Amer 26 (L) >60 mL/min   GFR calc Af Amer 30 (L) >60 mL/min   Anion gap 14 5 - 15    Comment: Performed at [facility], [address], [place], [place] [address]  Troponin I - Once     Status: Abnormal   Collection Time: 09/02/18 10:46 AM  Result Value Ref Range   Troponin I 5.65 (HH) <0.03 ng/mL    Comment: CRITICAL RESULT CALLED TO, READ BACK BY AND VERIFIED WITH: K.COBB,RN 09/02/2018 1211 DAVISB Performed at [facility], [place] [address], [place], [place] [address]   SARS Coronavirus 2 (CEPHEID- Performed in [place] hospital lab), Hosp Order     Status: None   Collection Time: 09/02/18 11:22 AM  Result Value Ref Range   SARS Coronavirus 2 NEGATIVE NEGATIVE    Comment: (NOTE) If result is NEGATIVE SARS-CoV-2 target nucleic acids are NOT DETECTED. The SARS-CoV-2 RNA is generally detectable in upper and lower  respiratory specimens during the acute phase of infection. The lowest  concentration of SARS-CoV-2 viral copies this assay can detect is 250  copies / mL. A negative result does not preclude SARS-CoV-2 infection  and should not be used as the sole basis for treatment or other  patient management decisions.  A negative result may occur  with  improper specimen collection / handling, submission of specimen other  than nasopharyngeal swab, presence of viral mutation(s) within the  areas targeted by this assay, and inadequate number of viral copies  (<250 copies / mL). A negative result must be combined with clinical  observations, patient history, and epidemiological information. If result is POSITIVE SARS-CoV-2 target nucleic acids are DETECTED. The SARS-CoV-2 RNA is generally detectable in upper and lower  respiratory specimens dur ing the acute phase of infection.  Positive  results are indicative of active infection with SARS-CoV-2.  Clinical  correlation with patient history and other diagnostic information is  necessary to determine patient infection status.  Positive results do  not rule out bacterial infection or co-infection with other viruses. If result is PRESUMPTIVE POSTIVE SARS-CoV-2 nucleic acids MAY BE PRESENT.   A presumptive positive result was obtained on the submitted specimen  and confirmed on repeat testing.  While 2019 novel coronavirus  (SARS-CoV-2) nucleic acids may be present in the submitted sample  additional confirmatory testing may be necessary for epidemiological  and / or clinical management purposes  to differentiate between  SARS-CoV-2 and other Sarbecovirus currently known to infect humans.  If clinically indicated additional testing with an alternate test  methodology [phone]) is advised. The SARS-CoV-2 RNA is generally  detectable in upper and lower respiratory sp ecimens during the acute  phase of infection. The expected result is Negative. Fact Sheet for Patients:  StrictlyIdeas.no Fact  Sheet for Healthcare Providers: BankingDealers.co.za This test is not yet approved or cleared by the [place] and has been authorized for detection and/or diagnosis of SARS-CoV-2 by FDA under an Emergency Use Authorization (EUA).  This EUA  will remain in effect (meaning this test can be used) for the duration of the COVID-19 declaration under Section 564(b)(1) of the Act, 21 U.S.C. section 360bbb-3(b)(1), unless the authorization is terminated or revoked sooner. Performed at [facility], [place] [address], [place], [place] [address]    Dg Chest Port 1 View  Result Date: 09/02/2018 CLINICAL DATA:  Shortness of breath and cough EXAM: PORTABLE CHEST 1 VIEW COMPARISON:  08/09/2018 FINDINGS: Cardiomegaly. There has been prior CABG and transcatheter aortic valve replacement. Vascular pedicle widening with Kerley lines and probable small pleural effusions. Dual-chamber ICD/pacer leads from the left in stable position. IMPRESSION: CHF pattern. Electronically Signed   By: Monte Fantasia M.D.   On: 09/02/2018 10:24    Pending Labs Unresulted Labs (From admission, onward)    Start     Ordered   09/02/18 1005  Culture, blood (routine x 2)  BLOOD CULTURE X 2,   STAT     09/02/18 1004   Signed and Held  Basic metabolic panel  Daily,   R     Signed and Held   Signed and Held  CBC  (heparin)  Once,   R    Comments:  Baseline for heparin therapy IF NOT ALREADY DRAWN.  Notify MD if PLT < 100 K.    Signed and Held   Signed and Held  Creatinine, serum  (heparin)  Once,   R    Comments:  Baseline for heparin therapy IF NOT ALREADY DRAWN.    Signed and Held   Signed and Held  CBC WITH DIFFERENTIAL  Once,   R     Signed and Held          Vitals/Pain Today's Vitals   09/02/18 1215 09/02/18 1230 09/02/18 1245 09/02/18 1300  BP: 112/60 (!) 127/58 118/63 121/65  Pulse:  60 (!) 59 (!) 59  Resp: (!) 23 (!) 29 (!) 32 (!) 22  Temp:      TempSrc:      SpO2:  94% 94% 93%  Weight:      Height:      PainSc:        Isolation Precautions Droplet and Contact precautions  Medications Medications  AeroChamber Plus Flo-Vu Large MISC (has no administration in time range)  albuterol (VENTOLIN HFA) 108 (90 Base) MCG/ACT inhaler 4 puff  (4 puffs Inhalation Given 09/02/18 1116)  AeroChamber Plus Flo-Vu Large MISC 1 each (1 each Other Given 09/02/18 1116)  furosemide (LASIX) injection 40 mg (40 mg Intravenous Given 09/02/18 1112)  aspirin chewable tablet 324 mg (324 mg Oral Given 09/02/18 1327)    Mobility walks High fall risk   Focused Assessments    R Recommendations: See Admitting Provider Note  Report given to:   Additional Notes:

## 2018-09-02 NOTE — Telephone Encounter (Signed)
Call transferred to triage for SOB, spoke to both patient and his wife. He is breathing "shallow and fast" sitting in bed with HOB elevated.  RR around 28.  I had her count breaths while I timed. Pulse ox is 74 %  Throughout call it did not go above this.  Went as low as 70% during call.  There is no home O2 Encouraged pursed lip breathing over phone. 104/68, 63.  Pulse rate on BP cuff matches that on the pulse ox for all of the call.  His fingers are warm and she tested on more than one finger.  Pulse ox seems accurate based on these findings.  Started yesterday or the night before and has gotten worse.  Developed a dry hacking cough overnight.   No chest pain. Swelling in lower extremities has improved but patient's wife feels his abdomen is now swollen. On amiodarone which was recently lowered. Appetite has been poor.  Had DCCV earlier this month.  Mrs. Summerlin feels his pulse at wrist and it feels steady.    Adv patient's wife to call EMS for immediate evaluation and probable hospital transfer. She and pt verbalize understanding.

## 2018-09-02 NOTE — ED Triage Notes (Signed)
Pt presents with SOB and cough starting yesterday, denies any CP, N/V or edema.  Pt alaert and able to speak full sentences.

## 2018-09-02 NOTE — H&P (Addendum)
History & Physical    Patient ID: [name] MRN: [id number], DOB/AGE: Sep 05, 1933   Admit date: 09/02/2018  Primary Physician: Binnie Rail, MD Primary Cardiologist: Dr. Angelena Form  Patient Profile    [name] is an 85 yo male with history of persistent atrial fibrillation on Xarelto, CAD s/p CABG (1982 with redo 1994), ischemic cardiomyopathy, out-of-hospital cardiac arrest in 2011 with successful resuscitation and subsequent placement of Medtronic ICD followed by Dr. Caryl Comes, aortic stenosis s/p TAVR 2015, HTN, HLD and OSA.   Past Medical History   Past Medical History:  Diagnosis Date   Aortic stenosis, severe    s/p TAVR with Edwards Sapien 3 THV (size 26 mm, model # 9600TFX, serial # [id number])   Arthritis    Cardiac arrest (HCC)    Chronic systolic CHF (congestive heart failure) ([place]) 01/21/2018   Coronary artery disease    NSTEMI 10/19   Essential hypertension    Headache    History of hiatal hernia    Hyperlipidemia    Kidney stones    OSA (obstructive sleep apnea) 02/05/2018   Severe obstructive sleep apnea with an AHI of 36.8/h with no significant central sleep apnea.   Now on CPAP at  5 cm H2O   Peripheral vascular disease ([place])    S/P cardiac cath 01/22/18  01/23/2018    Past Surgical History:  Procedure Laterality Date   ADENOIDECTOMY     APPENDECTOMY     CARDIAC CATHETERIZATION   11/16/2009    CARDIAC DEFIBRILLATOR PLACEMENT     CARDIOVERSION N/A 02/24/2018   Procedure: CARDIOVERSION;  Surgeon: Sueanne Margarita, MD;  Location: [place];  Service: Cardiovascular;  Laterality: N/A;   CARDIOVERSION N/A 08/21/2018   Procedure: CARDIOVERSION;  Surgeon: Skeet Latch, MD;  Location: [facility] ENDOSCOPY;  Service: Cardiovascular;  Laterality: N/A;   cataract surgery      Bilateral cataract surgery   CORONARY ARTERY BYPASS GRAFT     in 1982 and '84 with subsequent PTCI   EP IMPLANTABLE DEVICE N/A 03/21/2016   Procedure: [place];  Surgeon: Deboraha Sprang, MD;  Location: [place] CV LAB;  Service: Cardiovascular;  Laterality: N/A;   HERNIA REPAIR     LEFT AND RIGHT HEART CATHETERIZATION WITH CORONARY/GRAFT ANGIOGRAM N/A 02/01/2014   Procedure: LEFT AND RIGHT HEART CATHETERIZATION WITH Beatrix Fetters;  Surgeon: Sinclair Grooms, MD;  Location: [facility] CATH LAB;  Service: Cardiovascular;  Laterality: N/A;   left elbow tendon surgery     LEFT HEART CATH AND CORS/GRAFTS ANGIOGRAPHY N/A 01/22/2018   Procedure: LEFT HEART CATH AND CORS/GRAFTS ANGIOGRAPHY;  Surgeon: Leonie Man, MD;  Location: [name] CV LAB;  Service: Cardiovascular;  Laterality: N/A;   TEE WITHOUT CARDIOVERSION N/A 03/23/2014   Procedure: TRANSESOPHAGEAL ECHOCARDIOGRAM (TEE);  Surgeon: Burnell Blanks, MD;  Location: [place];  Service: Open Heart Surgery;  Laterality: N/A;   TONSILLECTOMY     TRANSCATHETER AORTIC VALVE REPLACEMENT, TRANSFEMORAL N/A 03/23/2014   Procedure: TRANSCATHETER AORTIC VALVE REPLACEMENT, TRANSFEMORAL;  Surgeon: Burnell Blanks, MD;  Location: [place];  Service: Open Heart Surgery;  Laterality: N/A;    Allergies  Allergies  Allergen Reactions   Cyclobenzaprine Hcl Rash   Tetracycline Rash        History of Present Illness    [name] presented to [facility] on 09/02/2018 for further evaluation of shortness of breath which worsened yesterday. He denies chest pain, recent fever, chills, cough, N/V, diaphoresis or syncope.  He has had issues with orthopnea   In the ED, troponin level was found to elevated at 5.65 without anginal symptoms. EKG with AV pacing. CXR with cardiomegaly,vascular pedicle widening with Awanda Mink lines and probable small pleural effusions.  BNP was markedly elevated at 2559.  Creatinine was elevated at 2.28 with a baseline of 1.6/1.8.   Home medications include amiodarone 200mg  QD, amlodipine 5, carvedilol 6.25, lasix 40 QD, Xarelto 20, Crestor 40.   Of note,  cardiac catheterization in October 2015 prior to the TAVR procedure revealed a occluded left main and RCA, patent LIMA to LAD, patent SVG to OM, patent SVG to RCA.  He subsequently underwent TAVR on 03/23/2014.  He was admitted in 01/2018 with atrial flutter with RVR. He converted to sinus rhythm on amiodarone drip. Troponin was elevated. He underwent another cardiac catheterization on 01/22/2018 which showed stable coronary anatomy. Elevated troponin likely related to demand ischemia. He underwent another cardioversion back to sinus rhythm in 02/2018. Last echocardiogram obtained on 02/10/2018 showed EF 40 to 45%, hypokinesis of the inferolateral myocardium. Patient was last seen by Dr. Angelena Form 05/2018 at which time he was doing well. His weight at that time was 72.5 kg.  Last ICD interrogation was performed in March 2020, this revealed a normal device function, since January 2020, his A. fib burden has been around 31%.  He was then seen in hospital consultation 08/10/2018 for acute onset dyspnea. Significant lab work on arrival shows creatinine of 1.69 which is close to his baseline. BNP 771. Hemoglobin 9.2 also near his baseline. Troponin was negative. Chest x-ray concerning for developing pulmonary edema. EKG showed paced rhythm with a heart rate of 60. Echocardiogram was performed during this hospitalization on 08/09/2018, initial interpretation was EF 30 to 35%, however this was corrected later by Dr. Harrington Challenger who says EF is around 50 to 55% with apical hypokinesis however the study had very poor acoustic window, severe LAE, no significant valvular issue. TAVR seems to be stable by echocardiogram. Patient underwent IV diuresis, he is net -700 mL since admission.  Urinary output has not been very significant on the IV diuresis. Unfortunately his creatinine has trended up from 1.7-2.0. Cardiology was consulted for acute on chronic diastolic heart failure. He was noted to be near baseline weight and was fairly  asymptomatic on physical exam from a CHF perspective. There was no further cardiac workup recommended at that time.   On 08/15/2018 he was seen during a telehealth visit by Dr. Caryl Comes. At that time, he had complaints of acute LE swelling and continued dyspnea. He was noted to be significantly anemic from prior hospitalization with a Hb of 8.0-9.0. Echocardiogram demonstrated interval improvement in LV function. It was thought that his symptoms were multifactorial due to age, CHF and anemia. Restoration of NSR was thought to be beneficial. He then presented for DCCV on 08/21/2018 and failed cardioversion. He remained in atrial tachycardia with plans for close follow up scheduled for tomorrow, 09/03/2018.   He has been referred to nephrology however has not been able to see them given hospitalizations.    Home Medications    Prior to Admission medications   Medication Sig Start Date End Date Taking? Authorizing Provider  acetaminophen (TYLENOL) 500 MG tablet Take 500 mg by mouth every 6 (six) hours as needed for moderate pain or headache.   Yes [provider]  amiodarone (PACERONE) 200 MG tablet Take 1 tablet (200 mg total) by mouth daily. Patient taking differently: Take 200 mg  by mouth 2 (two) times daily.  02/22/18  Yes Simmons, Brittainy M, PA-C  amLODipine (NORVASC) 5 MG tablet TAKE 1 TABLET BY MOUTH DAILY. Patient taking differently: Take 5 mg by mouth daily.  12/23/17  Yes Burnell Blanks, MD  b complex vitamins capsule Take 1 capsule by mouth daily. 09/02/18  Yes Burns, Claudina Lick, MD  Calcium Carbonate Antacid (TUMS PO) Take 2 tablets by mouth 2 (two) times daily as needed (indigestion/ acid reflux).   Yes [provider]  carvedilol (COREG) 6.25 MG tablet Take 1 tablet (6.25 mg total) by mouth 2 (two) times daily with a meal for 30 days. 08/11/18 09/10/18 Yes Ghimire, Dante Gang, MD  Coenzyme Q10 (COQ10) 100 MG CAPS Take 100 mg by mouth at bedtime.   Yes [provider]    diazepam (VALIUM) 5 MG tablet TAKE 1 TABLET BY MOUTH ONCE DAILY AS NEEDED Patient taking differently: Take 5 mg by mouth at bedtime as needed for sedation.  05/19/18  Yes Burnell Blanks, MD  ferrous sulfate 325 (65 FE) MG tablet Take 1 tablet (325 mg total) by mouth daily with breakfast. 08/10/18  Yes Burns, Claudina Lick, MD  furosemide (LASIX) 40 MG tablet TAKE 1 TABLET (40 MG TOTAL) BY MOUTH DAILY. 07/31/18  Yes Burnell Blanks, MD  Krill Oil 500 MG CAPS Take 500 mg by mouth at bedtime.   Yes [provider]  Magnesium 400 MG CAPS Take 400 mg by mouth at bedtime.   Yes [provider]  Melatonin 1 MG TABS Take 3 mg by mouth at bedtime as needed (sleep).    Yes [provider]  Multiple Vitamin (MULTIVITAMIN WITH MINERALS) TABS tablet Take 1 tablet by mouth daily.   Yes [provider]  nitroGLYCERIN (NITROSTAT) 0.4 MG SL tablet Place 1 tablet (0.4 mg total) under the tongue every 5 (five) minutes as needed for chest pain (for chest pain (MAX of 3 doses)). 04/12/15  Yes Burnell Blanks, MD  Potassium 99 MG TABS Take 99 mg by mouth 2 (two) times daily.   Yes [provider]  Propylene Glycol (SYSTANE COMPLETE) 0.6 % SOLN Place 1 drop into both eyes daily as needed (irritation).   Yes [provider]  rosuvastatin (CRESTOR) 40 MG tablet TAKE 1 TABLET BY MOUTH DAILY. Patient taking differently: Take 40 mg by mouth daily with supper.  12/23/17  Yes Burnell Blanks, MD  senna (SENOKOT) 8.6 MG TABS tablet Take 1 tablet by mouth daily as needed for mild constipation.   Yes [provider]  trospium (SANCTURA) 20 MG tablet Take 20 mg by mouth 2 (two) times daily.   Yes [provider]  XARELTO 15 MG TABS tablet TAKE 1 TABLET (15 MG TOTAL) BY MOUTH DAILY WITH SUPPER. Patient taking differently: Take 15 mg by mouth daily with supper.  08/22/18  Yes Deboraha Sprang, MD    Family History    Family History   Problem Relation Age of Onset   Heart attack Father 84   Coronary artery disease Other        family history is dignificant for early CAD in several members    Social History    Social History   Socioeconomic History   Marital status: Married    Spouse name: Not on file   Number of children: 3   Years of education: Not on file   Highest education level: Not on file  Occupational History   Occupation: Retired  Social Designer, fashion/clothing strain: Not hard at all   Food insecurity:    Worry: Never true    Inability: Never true   Transportation needs:    Medical: No    Non-medical: No  Tobacco Use   Smoking status: Former Smoker    Last attempt to quit: 10/25/1952    Years since quitting: 65.8   Smokeless tobacco: Never Used  Substance and Sexual Activity   Alcohol use: Yes    Alcohol/week: 5.0 - 6.0 standard drinks    Types: 5 - 6 Glasses of wine per week    Comment: 5-6 times a week   Drug use: No   Sexual activity: Never  Lifestyle   Physical activity:    Days per week: Not on file    Minutes per session: Not on file   Stress: Only a little  Relationships   Social connections:    Talks on phone: More than three times a week    Gets together: More than three times a week    Attends religious service: More than 4 times per year    Active member of club or organization: Not on file    Attends meetings of clubs or organizations: More than 4 times per year    Relationship status: Married   Intimate partner violence:    Fear of current or ex partner: No    Emotionally abused: No    Physically abused: No    Forced sexual activity: No  Other Topics Concern   Not on file  Social History Narrative   Not on file     Review of Systems   See HPI All other systems reviewed and are otherwise negative except as noted above.  Physical Exam    Blood pressure 121/65, pulse (!) 59, temperature 98.8 F (37.1 C), temperature source Oral,  resp. rate (!) 22, height 5\' 7"  (1.702 m), weight 68.5 kg, SpO2 93 %.   General: Elderly, NAD Skin: Warm, dry, intact  Head: Normocephalic, atraumatic, clear, moist mucus membranes. Neck: Negative for carotid bruits. No JVD Lungs:Diminished. No wheezes, rales, or rhonchi. Breathing is unlabored. Cardiovascular: RRR with S1 S2. + murmurs, no rubs, gallops, or LV heave appreciated. Abdomen: Soft, non-tender, mildly distended with normoactive bowel sounds.No obvious abdominal masses. MSK: Strength and tone appear normal for age. 5/5 in all extremities Extremities: No edema. No clubbing or cyanosis. DP/PT pulses 2+ bilaterally Neuro: Alert and oriented. No focal deficits. No facial asymmetry. MAE spontaneously. Psych: Responds to questions appropriately with normal affect.    Labs    Troponin (Point of Care Test) No results for input(s): TROPIPOC in the last 72 hours. Recent Labs    09/02/18 1046  TROPONINI 5.65*   Lab Results  Component Value Date   WBC 9.2 09/02/2018   HGB 10.2 (L) 09/02/2018   HCT 34.6 (L) 09/02/2018   MCV 77.4 (L) 09/02/2018   PLT 145 (L) 09/02/2018    Recent Labs  Lab 09/02/18 1046  NA 139  K 4.6  CL 104  CO2 21*  BUN 36*  CREATININE 2.28*  CALCIUM 10.8*  PROT 6.5  BILITOT 0.8  ALKPHOS 58  ALT 37  AST 51*  GLUCOSE 159*   Lab Results  Component Value Date   CHOL 130 01/21/2018   HDL 30 (L) 01/21/2018   LDLCALC 58 01/21/2018   TRIG 209 (H) 01/21/2018   No results found for: [facility]   Radiology Studies    Dg Chest  Port 1 View  Result Date: 09/02/2018 CLINICAL DATA:  Shortness of breath and cough EXAM: PORTABLE CHEST 1 VIEW COMPARISON:  08/09/2018 FINDINGS: Cardiomegaly. There has been prior CABG and transcatheter aortic valve replacement. Vascular pedicle widening with Kerley lines and probable small pleural effusions. Dual-chamber ICD/pacer leads from the left in stable position. IMPRESSION: CHF pattern. Electronically Signed   By: Monte Fantasia M.D.   On: 09/02/2018 10:24   Dg Chest Portable 1 View  Result Date: 08/09/2018 CLINICAL DATA:  Worsening shortness of breath over the past week. EXAM: PORTABLE CHEST 1 VIEW COMPARISON:  Chest x-ray dated August 01, 2018. FINDINGS: Unchanged left chest wall pacemaker. Stable cardiomegaly status post CABG and TAVR. New diffusely increased interstitial markings. New small bilateral pleural effusions and left greater than right bibasilar atelectasis. No pneumothorax. No acute osseous abnormality. IMPRESSION: 1. New mild interstitial pulmonary edema and small bilateral pleural effusions, consistent with congestive heart failure. Electronically Signed   By: Titus Dubin M.D.   On: 08/09/2018 04:46   US Renal Artery Duplex Complete  Result Date: 08/26/2018 CLINICAL DATA:  85 year old male with a history of chronic kidney disease EXAM: RENAL/URINARY TRACT ULTRASOUND RENAL DUPLEX DOPPLER ULTRASOUND COMPARISON:  CT 03/11/2014 FINDINGS: Right Kidney: Length: 10.8 cm x 4.6 cm x 5.4 cm, 140 cc. Renal cortical thinning. No hydronephrosis. Anechoic cystic structure with through transmission measures 5.3 cm, with no internal complexity or flow. Additional cystic structure on the lateral cortex measures 2.0 cm with through transmission and no internal complexity/flow. Left Kidney: Length: 12.5 cm x 6.0 cm x 5.3 cm, 210 cc. No hydronephrosis. Cortical thinning, though not as severe as the right. Anechoic cystic structure measures 7.4 cm with through transmission no internal complexity or flow. Additional anechoic cystic structure measures 1.7 cm with no internal color or flow. Bladder:  Bilateral ureteral jets identified.  No postvoid residual. RENAL DUPLEX ULTRASOUND Right Renal Artery Velocities: Origin: Not visualized Mid: Not visualized Hilum:  40 cm/sec Interlobar:  29 cm/sec Arcuate:  28 cm/sec Parvus tardus waveform of the arcuate and inter lobar arteries. Left Renal Artery Velocities: Origin: Not visualized  Mid: Not visualized Hilum:  69 cm/sec Interlobar:  51 cm/sec Arcuate:  22 cm/sec Aortic Velocity:  97 cm/sec Right Renal-Aortic Ratios: Origin: NA Mid:  NA Hilum: 0.4 Interlobar: 0.3 Arcuate: 0.2 Left Renal-Aortic Ratios: Origin: NA Mid: NA Hilum: 0.7 Interlobar: 0.5 Arcuate: 0.2 IMPRESSION: Cortical thinning of the right greater than left kidneys. Directed duplex is limited by nonvisualization of the origin and mid segment of the left or right renal artery. There is a parvus tardus waveform of the right arcuate and right inter lobar arteries at the renal hilum, suggesting postobstructive waveform in the setting of a proximal right renal artery stenosis. If there is concern for further imaging evaluation, either evaluation with formal angiogram or CT angiogram may be useful. Bilateral Bosniak 1 cysts. Negative for hydronephrosis. Signed, Dulcy Fanny. Dellia Nims, RPVI [facility] Radiology Electronically Signed   By: Corrie Mckusick D.O.   On: 08/26/2018 12:27   ECG & Cardiac Imaging    Echo November 2019: -Left ventricle: The cavity size was mildly dilated. Wall thickness was normal. Systolic function was mildly to moderately reduced. The estimated ejection fraction was in the range of 40% to 45%. Hypokinesis of the inferolateral myocardium. - Ventricular septum: Septal motion showed paradox. - Aortic valve: A stent-valve (TAVR) bioprosthesis was present and functioning normally. - Mitral valve: There was mild regurgitation  directed centrally. - Left atrium: The atrium was mildly dilated. - Right ventricle: The cavity size was mildly dilated. - Right atrium: The atrium was moderately dilated.  Impressions:  - Throughout most of the study, the rhythm is 2:1 AV block.  Assessment & Plan    1. Acute on chronic CHF exacerbation: -Pt presented with worsening dyspnea that appears to be ongoing issue. As stated above he was last seen in hospital  consultation earlier this month in which he was treated with diuretics and no further cardiac work-up.   -Echocardiogram 08/09/2018 with initial interpretation was EF 30 to 35%, however this was corrected later by Dr. Harrington Challenger who says EF is around 50 to 55% with apical hypokinesis however the study had very poor acoustic window, severe LAE, no significant valvular issue. Patient underwent IV diuresis with poor UO and rising creatinine. Cardiology was consulted for acute on chronic diastolic heart failure.  -CXR and BNP this admission reveal significant fluid volume overload. CXR with pleural effusions and BNP greater than 2000 -We will treat with IV diuretics, 40 mg BID and monitor renal function closely -Creatinine markedly elevated at 2.28 today with a baseline of 1.6-1.8  likely in the setting of fluid volume overload -Would consider reconsulting nephrology for inpatient, diuretic assistance -Weight, 68.5kg today. Was 72.5kg on last hospital consultation -Strict I&O  2. CAD s/p CABG without angina:  -Denies anginal symptoms -CABG in 1982 and redo CABG in 1994 -Last cath 01/2018 with stable CAD/3 patent bypass grafts -Troponin markedly elevated today>>5.65 however looking back at prior admission history, the patient had significant troponin elevation 01/2018 in which a cardiac catheterization was performed which showed no obvious culprit to explain non-STEMI and suggestions that demand ischemia in the setting of A. fib with RVR with LAD lesions leading to LAD ischemia would explain -Continue statin and beta blocker -No ASA secondary to Xarelto   3. Atrial fibrillation, persistent:  -EKG with AV pacing at 60 bpm  -On amiodarone 200mg  QD and metoprolol -Continue Xarelto 20  -He has been followed closely in the EP clinic by Dr. Caryl Comes    4. Severe Aortic valve stenosis:  -s/p TAVR December 2015>>followed by Dr. Angelena Form  -Normal functioning valve per echocardiogram 08/09/2018 -On Xarelto AF  -SBE  prophylaxis prior to indicated procedures  5. HLD:  -Lipids controlled. Continue statin  6. Obstructive sleep apnea:  -He is using CPAP now.    Severity of Illness: The appropriate patient status for this patient is OBSERVATION. Observation status is judged to be reasonable and necessary in order to provide the required intensity of service to ensure the patient's safety. The patient's presenting symptoms, physical exam findings, and initial radiographic and laboratory data in the context of their medical condition is felt to place them at decreased risk for further clinical deterioration. Furthermore, it is anticipated that the patient will be medically stable for discharge from the hospital within 2 midnights of admission. The following factors support the patient status of observation.   " The patient's presenting symptoms include SOB. " The physical exam findings include SOB. " The initial radiographic and laboratory data are elevated BNP.    Signed, Kathyrn Drown NP-C [facility] Pager: [phone] 09/02/2018, @NOW

## 2018-09-02 NOTE — ED Notes (Signed)
Talked with the doctor about a progressive bed. Bed is being reassigned.

## 2018-09-02 NOTE — Telephone Encounter (Signed)
New message   Pt c/o Shortness Of Breath: STAT if SOB developed within the last 24 hours or pt is noticeably SOB on the phone  1. Are you currently SOB (can you hear that pt is SOB on the phone)? Yes   2. How long have you been experiencing SOB? yesterday  3. Are you SOB when sitting or when up moving around? All the time   4. Are you currently experiencing any other symptoms? Swelling in stomach and ankles, tired and weak

## 2018-09-02 NOTE — ED Notes (Signed)
Pt o2 sats dropped into the 80's with 6L of 02. Pt is currently on 12L of oxygen.

## 2018-09-02 NOTE — Telephone Encounter (Signed)
M  thx  sk

## 2018-09-02 NOTE — Consult Note (Signed)
Reason for Consult: AKI/CKD stage 3 Referring Physician: [place], MD  [name] is an 85 y.o. male.  HPI: [name] has an extensive past medical history significant for CAD s/p CABG (1982 and redo 1994), ischemic CMP, severe aortic stenosis s/p TAVR 2015, HTN, OSA, HLD, persistent atrial fibrillation, out of hospital cardiac arrest in 2011 s/p AICD, and CKD stage 3 who presents to [facility] ED with worsening shortness of breath, lower extremity edema, and increasing abdominal girth over the last few days.  In the ED he was noted to have pulmonary edema, elevated troponin of 5.65, BNP of 2559, and AKI/CKD with his BUN/Cr 36/2.28 (baseline 1.6-1.8).  We were consulted to further evaluate his AKI/CKD.  The trend in Scr is seen below.  He was evaluated by Dr. Lorrene Reid in our office several weeks ago and she noted asymmetry of his kidney size and ordered a renal artery duplex which suggested right renal artery stenosis.  He unfortunately has been hospitalized twice this month with recurrent acute on chronic CHF.  He denies any NSAIDs or COX-II I's.  No N/V/D, fevers, chills, or productive cough.  His covid-19 test was negative.   Trend in Creatinine: Creatinine, Ser  Date/Time Value Ref Range Status  09/02/2018 10:46 AM 2.28 (H) 0.61 - 1.24 mg/dL Final  08/11/2018 05:22 AM 1.96 (H) 0.61 - 1.24 mg/dL Final  08/10/2018 12:28 AM 2.00 (H) 0.61 - 1.24 mg/dL Final  08/09/2018 06:52 AM 1.69 (H) 0.61 - 1.24 mg/dL Final  08/01/2018 07:20 PM 1.71 (H) 0.61 - 1.24 mg/dL Final  03/20/2018 01:14 PM 1.60 (H) 0.40 - 1.50 mg/dL Final  03/03/2018 10:56 AM 1.95 (H) 0.40 - 1.50 mg/dL Final  02/18/2018 04:11 PM 1.68 (H) 0.76 - 1.27 mg/dL Final  01/28/2018 10:40 AM 1.83 (H) 0.76 - 1.27 mg/dL Final  01/23/2018 10:00 AM 1.66 (H) 0.61 - 1.24 mg/dL Final  01/22/2018 07:45 AM 1.70 (H) 0.61 - 1.24 mg/dL Final  01/21/2018 04:47 AM 1.85 (H) 0.61 - 1.24 mg/dL Final  01/20/2018 05:45 PM 1.92 (H) 0.61 - 1.24 mg/dL Final  06/25/2017  03:42 PM 1.02 0.76 - 1.27 mg/dL Final  03/13/2017 06:40 PM 1.21 0.76 - 1.27 mg/dL Final  12/21/2016 09:14 AM 1.15 0.76 - 1.27 mg/dL Final  12/14/2014 08:54 AM 1.05 0.40 - 1.50 mg/dL Final  08/06/2014 07:35 AM 0.91 0.40 - 1.50 mg/dL Final  03/25/2014 03:23 AM 0.97 0.50 - 1.35 mg/dL Final  03/24/2014 04:00 AM 0.90 0.50 - 1.35 mg/dL Final  03/23/2014 02:27 PM 0.90 0.50 - 1.35 mg/dL Final  03/23/2014 01:29 PM 0.70 0.50 - 1.35 mg/dL Final  03/23/2014 12:42 PM 0.80 0.50 - 1.35 mg/dL Final  03/19/2014 02:18 PM 0.94 0.50 - 1.35 mg/dL Final  01/29/2014 09:26 AM 1.3 0.4 - 1.5 mg/dL Final  11/25/2013 10:35 AM 1.0 0.4 - 1.5 mg/dL Final  07/22/2013 09:38 AM 0.9 0.4 - 1.5 mg/dL Final  02/20/2013 09:17 AM 0.9 0.4 - 1.5 mg/dL Final  10/31/2012 07:42 AM 1.0 0.4 - 1.5 mg/dL Final  07/04/2012 10:25 AM 1.1 0.4 - 1.5 mg/dL Final  02/15/2012 08:36 AM 1.2 0.4 - 1.5 mg/dL Final  10/18/2011 09:49 AM 1.0 0.4 - 1.5 mg/dL Final  06/05/2011 09:13 AM 0.9 0.4 - 1.5 mg/dL Final  02/12/2011 09:25 AM 0.9 0.4 - 1.5 mg/dL Final  10/24/2010 08:17 AM 1.0 0.4 - 1.5 mg/dL Final  07/10/2010 11:54 AM 1.11 0.4 - 1.5 mg/dL Final  11/16/2009 09:48 AM 1.09 0.4 - 1.5 mg/dL Final  09/08/2009 05:00  AM 0.92 0.4 - 1.5 mg/dL Final  09/07/2009 03:35 AM 0.78 0.4 - 1.5 mg/dL Final  09/06/2009 05:00 AM 0.81 0.4 - 1.5 mg/dL Final  09/05/2009 04:15 AM 0.79 0.4 - 1.5 mg/dL Final  09/04/2009 04:00 AM 0.87 0.4 - 1.5 mg/dL Final  09/03/2009 11:15 AM 0.90 0.4 - 1.5 mg/dL Final  09/03/2009 04:00 AM 0.97 0.4 - 1.5 mg/dL Final  09/02/2009 03:45 AM 0.98 0.4 - 1.5 mg/dL Final  09/01/2009 06:00 AM 1.08 0.4 - 1.5 mg/dL Final  08/31/2009 11:49 AM 1.14 0.4 - 1.5 mg/dL Final  08/31/2009 07:30 AM 1.12 0.4 - 1.5 mg/dL Final  08/31/2009 03:30 AM 1.14 0.4 - 1.5 mg/dL Final  08/31/2009 12:00 AM 1.16 0.4 - 1.5 mg/dL Final  08/30/2009 07:45 PM 1.24 0.4 - 1.5 mg/dL Final  08/30/2009 04:00 PM 1.10 0.4 - 1.5 mg/dL Final    PMH:   Past Medical History:   Diagnosis Date  . Aortic stenosis, severe    s/p TAVR with Edwards Sapien 3 THV (size 26 mm, model # 9600TFX, serial # [id number])  . Arthritis   . Cardiac arrest ([place])   . Chronic systolic CHF (congestive heart failure) ([place]) 01/21/2018  . Coronary artery disease    NSTEMI 10/19  . Essential hypertension   . Headache   . History of hiatal hernia   . Hyperlipidemia   . Kidney stones   . OSA (obstructive sleep apnea) 02/05/2018   Severe obstructive sleep apnea with an AHI of 36.8/h with no significant central sleep apnea.   Now on CPAP at  5 cm H2O  . Peripheral vascular disease ([place])   . S/P cardiac cath 01/22/18  01/23/2018    PSH:   Past Surgical History:  Procedure Laterality Date  . ADENOIDECTOMY    . APPENDECTOMY    . CARDIAC CATHETERIZATION   11/16/2009   . CARDIAC DEFIBRILLATOR PLACEMENT    . CARDIOVERSION N/A 02/24/2018   Procedure: CARDIOVERSION;  Surgeon: Sueanne Margarita, MD;  Location: [facility] ENDOSCOPY;  Service: Cardiovascular;  Laterality: N/A;  . CARDIOVERSION N/A 08/21/2018   Procedure: CARDIOVERSION;  Surgeon: Skeet Latch, MD;  Location: [facility] ENDOSCOPY;  Service: Cardiovascular;  Laterality: N/A;  . cataract surgery      Bilateral cataract surgery  . CORONARY ARTERY BYPASS GRAFT     in 1982 and '84 with subsequent PTCI  . EP IMPLANTABLE DEVICE N/A 03/21/2016   Procedure: ICD Generator Changeout;  Surgeon: Deboraha Sprang, MD;  Location: [place] CV LAB;  Service: Cardiovascular;  Laterality: N/A;  . HERNIA REPAIR    . LEFT AND RIGHT HEART CATHETERIZATION WITH CORONARY/GRAFT ANGIOGRAM N/A 02/01/2014   Procedure: LEFT AND RIGHT HEART CATHETERIZATION WITH Beatrix Fetters;  Surgeon: Sinclair Grooms, MD;  Location: [facility] CATH LAB;  Service: Cardiovascular;  Laterality: N/A;  . left elbow tendon surgery    . LEFT HEART CATH AND CORS/GRAFTS ANGIOGRAPHY N/A 01/22/2018   Procedure: LEFT HEART CATH AND CORS/GRAFTS ANGIOGRAPHY;  Surgeon: Leonie Man, MD;   Location: [place] CV LAB;  Service: Cardiovascular;  Laterality: N/A;  . TEE WITHOUT CARDIOVERSION N/A 03/23/2014   Procedure: TRANSESOPHAGEAL ECHOCARDIOGRAM (TEE);  Surgeon: Burnell Blanks, MD;  Location: [place];  Service: Open Heart Surgery;  Laterality: N/A;  . TONSILLECTOMY    . TRANSCATHETER AORTIC VALVE REPLACEMENT, TRANSFEMORAL N/A 03/23/2014   Procedure: TRANSCATHETER AORTIC VALVE REPLACEMENT, TRANSFEMORAL;  Surgeon: Burnell Blanks, MD;  Location: [place];  Service: Open Heart Surgery;  Laterality: N/A;  Allergies:  Allergies  Allergen Reactions  . Cyclobenzaprine Hcl Rash  . Tetracycline Rash         Medications:   Prior to Admission medications   Medication Sig Start Date End Date Taking? Authorizing Provider  acetaminophen (TYLENOL) 500 MG tablet Take 500 mg by mouth every 6 (six) hours as needed for moderate pain or headache.   Yes [provider]  amiodarone (PACERONE) 200 MG tablet Take 1 tablet (200 mg total) by mouth daily. Patient taking differently: Take 200 mg by mouth 2 (two) times daily.  02/22/18  Yes Simmons, Brittainy M, PA-C  amLODipine (NORVASC) 5 MG tablet TAKE 1 TABLET BY MOUTH DAILY. Patient taking differently: Take 5 mg by mouth daily.  12/23/17  Yes Burnell Blanks, MD  b complex vitamins capsule Take 1 capsule by mouth daily. 09/02/18  Yes Burns, Claudina Lick, MD  Calcium Carbonate Antacid (TUMS PO) Take 2 tablets by mouth 2 (two) times daily as needed (indigestion/ acid reflux).   Yes [provider]  carvedilol (COREG) 6.25 MG tablet Take 1 tablet (6.25 mg total) by mouth 2 (two) times daily with a meal for 30 days. 08/11/18 09/10/18 Yes Ghimire, Dante Gang, MD  Coenzyme Q10 (COQ10) 100 MG CAPS Take 100 mg by mouth at bedtime.   Yes [provider]  diazepam (VALIUM) 5 MG tablet TAKE 1 TABLET BY MOUTH ONCE DAILY AS NEEDED Patient taking differently: Take 5 mg by mouth at bedtime as needed for sedation.  05/19/18  Yes  Burnell Blanks, MD  ferrous sulfate 325 (65 FE) MG tablet Take 1 tablet (325 mg total) by mouth daily with breakfast. 08/10/18  Yes Burns, Claudina Lick, MD  furosemide (LASIX) 40 MG tablet TAKE 1 TABLET (40 MG TOTAL) BY MOUTH DAILY. 07/31/18  Yes Burnell Blanks, MD  Krill Oil 500 MG CAPS Take 500 mg by mouth at bedtime.   Yes [provider]  Magnesium 400 MG CAPS Take 400 mg by mouth at bedtime.   Yes [provider]  Melatonin 1 MG TABS Take 3 mg by mouth at bedtime as needed (sleep).    Yes [provider]  Multiple Vitamin (MULTIVITAMIN WITH MINERALS) TABS tablet Take 1 tablet by mouth daily.   Yes [provider]  nitroGLYCERIN (NITROSTAT) 0.4 MG SL tablet Place 1 tablet (0.4 mg total) under the tongue every 5 (five) minutes as needed for chest pain (for chest pain (MAX of 3 doses)). 04/12/15  Yes Burnell Blanks, MD  Potassium 99 MG TABS Take 99 mg by mouth 2 (two) times daily.   Yes [provider]  Propylene Glycol (SYSTANE COMPLETE) 0.6 % SOLN Place 1 drop into both eyes daily as needed (irritation).   Yes [provider]  rosuvastatin (CRESTOR) 40 MG tablet TAKE 1 TABLET BY MOUTH DAILY. Patient taking differently: Take 40 mg by mouth daily with supper.  12/23/17  Yes Burnell Blanks, MD  senna (SENOKOT) 8.6 MG TABS tablet Take 1 tablet by mouth daily as needed for mild constipation.   Yes [provider]  trospium (SANCTURA) 20 MG tablet Take 20 mg by mouth 2 (two) times daily.   Yes [provider]  XARELTO 15 MG TABS tablet TAKE 1 TABLET (15 MG TOTAL) BY MOUTH DAILY WITH SUPPER. Patient taking differently: Take 15 mg by mouth daily with supper.  08/22/18  Yes Deboraha Sprang, MD    Inpatient medications: . AeroChamber Plus Flo-Vu Large  Discontinued Meds:   Medications Discontinued During This Encounter  Medication Reason  . Thiamine HCl (B-1) 100 MG TABS Patient Preference     Social History:  reports that he quit smoking about 65 years ago. He has never used smokeless tobacco. He reports current alcohol use of about 5.0 - 6.0 standard drinks of alcohol per week. He reports that he does not use drugs.  Family History:   Family History  Problem Relation Age of Onset  . Heart attack Father 4  . Coronary artery disease Other        family history is dignificant for early CAD in several members    Pertinent items are noted in HPI. Weight change:  No intake or output data in the 24 hours ending 09/02/18 1613 BP 111/61   Pulse 62   Temp 98.8 F (37.1 C) (Oral)   Resp (!) 27   Ht 5\' 7"  (1.702 m)   Wt 68.5 kg   SpO2 93%   BMI 23.65 kg/m  Vitals:   09/02/18 1430 09/02/18 1445 09/02/18 1500 09/02/18 1515  BP: 112/74 (!) 109/58 (!) 116/58 111/61  Pulse: 60 60 60 62  Resp: (!) 30 (!) 25 (!) 24 (!) 27  Temp:      TempSrc:      SpO2: 94% 95% 95% 93%  Weight:      Height:         General appearance: alert, cooperative, appears stated age and no distress Head: Normocephalic, without obvious abnormality, atraumatic Resp: diminished breath sounds bibasilar Cardio: bradycardic, no rub GI: soft, non-tender; bowel sounds normal; no masses,  no organomegaly Extremities: edema 1+ ankles bilaterally  Labs: Basic Metabolic Panel: Recent Labs  Lab 09/02/18 1031 09/02/18 1046  NA 137 139  K 4.6 4.6  CL  --  104  CO2  --  21*  GLUCOSE  --  159*  BUN  --  36*  CREATININE  --  2.28*  ALBUMIN  --  3.8  CALCIUM  --  10.8*   Liver Function Tests: Recent Labs  Lab 09/02/18 1046  AST 51*  ALT 37  ALKPHOS 58  BILITOT 0.8  PROT 6.5  ALBUMIN 3.8   No results for input(s): LIPASE, AMYLASE in the last 168 hours. No results for input(s): AMMONIA in the last 168 hours. CBC: Recent Labs  Lab 09/02/18 1031 09/02/18 1046  WBC  --  9.2  NEUTROABS  --  7.7  HGB 11.6* 10.2*  HCT 34.0* 34.6*  MCV  --  77.4*  PLT  --  145*    PT/INR: @LABRCNTIP (inr:5) Cardiac Enzymes: ) Recent Labs  Lab 09/02/18 1046  TROPONINI 5.65*   CBG: No results for input(s): GLUCAP in the last 168 hours.  Iron Studies: No results for input(s): IRON, TIBC, TRANSFERRIN, FERRITIN in the last 168 hours.  Xrays/Other Studies: Dg Chest Port 1 View  Result Date: 09/02/2018 CLINICAL DATA:  Shortness of breath and cough EXAM: PORTABLE CHEST 1 VIEW COMPARISON:  08/09/2018 FINDINGS: Cardiomegaly. There has been prior CABG and transcatheter aortic valve replacement. Vascular pedicle widening with Kerley lines and probable small pleural effusions. Dual-chamber ICD/pacer leads from the left in stable position. IMPRESSION: CHF pattern. Electronically Signed   By: Monte Fantasia M.D.   On: 09/02/2018 10:24     Assessment/Plan: 1.  AKI/CKD stage 3 in setting of decompensated, combined systolic and diastolic CHF.  His Cr has been rising since his hospitalization in October 2019 for NSTEMI s/p cardiac cath  and has had multiple episodes of AKI/CKD with his hospitalizations for acute CHF exacerbations.  He was evaluated by Dr. Lorrene Reid who ordered a renal artery duplex with possible RAS of the right kidney.  He has not been hypertensive and was hypotensive in the ED.  Agree with admission for IV diuresis and will follow his renal function. 2. Acute on chronic combined systolic and diastolic CHF- agree with IV lasix and follow UOP. 3. CAD s/p CABG and redo- cath 10/19 revealed stable CAD.  Elevated troponin but no angina. 4. Ischemic CMP s/p AICD 5. Permanent atrial fibrillation- s/p failed cardioversion on 08/21/18.  On xarelto  6. Severe aortic stenosis s/p TAVR 12/15 with normal functioning valve by ECHO 08/09/18 7. HTN- BP is soft, hold meds and follow with diuresis 8. Anemia of CKD- check iron stores 9. Possible right renal artery stenosis- unable to check MRI due to AICD and would hold off on renal angiogram with AKI.  Continue to follow  BP's 10. Disposition- pt has been clinically declining over the past month.  Consider palliative care consult to help set goals/limits of care. 11. Hypercalcemia- check vit D levels and iPTH.  Avoid any vit D or calcium supplementation.  Will check SPEP/UPEP given AKI, anemia, and hypercalcemia.    Governor Rooks To[place] Mia 09/02/2018, 4:13 PM

## 2018-09-02 NOTE — Progress Notes (Signed)
RT set up CPAP and placed on patient. Patient tolerating well at this time. RT will monitor as needed.  

## 2018-09-02 NOTE — ED Notes (Signed)
Provider at bedside

## 2018-09-02 NOTE — Telephone Encounter (Signed)
Per device clinic, pt was in [place] at the time he sent in a remote check this weekend.

## 2018-09-02 NOTE — ED Notes (Signed)
Dr. Ellender Hose aware of pts Troponin and BNP

## 2018-09-02 NOTE — Progress Notes (Signed)
Rt arrived to find pt on [place] with regular water/humidity bottle at 15LPM. RT exchanged for HFNC salter at 10LPM with sats at 91%.  Rt spoke with pt about cpap and pt states he does not use O2 with his home unit but is requiring increased amounts of O2 at this time. RT will consider options with pt and continue with therapy as directed.

## 2018-09-02 NOTE — ED Provider Notes (Signed)
[facility] EMERGENCY DEPARTMENT Provider Note   CSN: [id number] Arrival date & time: 09/02/18  0941    History   Chief Complaint No chief complaint on file. Shortness of breath  HPI [name] is a 85 y.o. male.     HPI   [name] is a 85 y.o. male, with a history of CHF, CAD, HTN, PVD, aortic stenosis, presenting to the ED with shortness of breath beginning yesterday.  Shortness of breath arose throughout the day.  Accompanied by nonproductive cough. He has had bilateral lower extremity edema without pain over the past several days.   Denies fever/chills, chest pain, syncope, diaphoresis, abdominal pain, N/V/D, or any other complaints.    Past Medical History:  Diagnosis Date  . Aortic stenosis, severe    s/p TAVR with Edwards Sapien 3 THV (size 26 mm, model # 9600TFX, serial # [id number])  . Arthritis   . Cardiac arrest ([place])   . Chronic systolic CHF (congestive heart failure) ([place]) 01/21/2018  . Coronary artery disease    NSTEMI 10/19  . Essential hypertension   . Headache   . History of hiatal hernia   . Hyperlipidemia   . Kidney stones   . OSA (obstructive sleep apnea) 02/05/2018   Severe obstructive sleep apnea with an AHI of 36.8/h with no significant central sleep apnea.   Now on CPAP at  5 cm H2O  . Peripheral vascular disease ([place])   . S/P cardiac cath 01/22/18  01/23/2018    Patient Active Problem List   Diagnosis Date Noted  . Respiratory failure with hypoxia ([place]) 08/18/2018  . Cardiac arrest ([place])   . Acute on chronic diastolic CHF (congestive heart failure) ([employer] [place]) 08/11/2018  . CHF (congestive heart failure) ([place]) 08/10/2018  . Essential hypertension 08/09/2018  . CKD (chronic kidney disease) stage 3, GFR 30-59 ml/min (HCC) 08/09/2018  . AKI (acute kidney injury) ([place]) 03/03/2018  . Constipation 03/03/2018  . Anemia 03/03/2018  . OSA (obstructive sleep apnea) 02/05/2018  . S/P cardiac cath 01/22/18 stable -treating  medically  01/23/2018  . Atrial tachycardia ([place]) 01/22/2018  . Non-ST elevation (NSTEMI) myocardial infarction ([place]) 01/21/2018  . Chronic systolic CHF (congestive heart failure) ([place]) 01/21/2018  . Sleep difficulties 07/29/2015  . Erectile disorder due to medical condition in male patient 12/17/2014  . Severe aortic valve stenosis - s/p  Edwards Sapien 3 THV (size 26 mm, model # 9600TFX, serial # [id number]) 03/23/2014 03/23/2014  . Abnormal nuclear stress test 01/29/2014  . Ischemic cardiomyopathy Jan 03, 2012  . Sudden cardiac death-aborted 2010-10-27  . Back pain 07/10/2010  . Hypercholesteremia 07/10/2010  . Benign hypertensive heart disease without heart failure 07/10/2010  . S/P CABG (coronary artery bypass graft) 1982 and re-do 1994  07/10/2010  . Implantable cardioverter-defibrillator (ICD) in situ 03/30/2010  . Atrial fibrillation ([place]) 04/21/202011    Past Surgical History:  Procedure Laterality Date  . ADENOIDECTOMY    . APPENDECTOMY    . CARDIAC CATHETERIZATION   11/16/2009   . CARDIAC DEFIBRILLATOR PLACEMENT    . CARDIOVERSION N/A 02/24/2018   Procedure: CARDIOVERSION;  Surgeon: Sueanne Margarita, MD;  Location: [facility] ENDOSCOPY;  Service: Cardiovascular;  Laterality: N/A;  . CARDIOVERSION N/A 08/21/2018   Procedure: CARDIOVERSION;  Surgeon: Skeet Latch, MD;  Location: [facility] ENDOSCOPY;  Service: Cardiovascular;  Laterality: N/A;  . cataract surgery      Bilateral cataract surgery  . CORONARY ARTERY BYPASS GRAFT     in 1982 and '84 with  subsequent PTCI  . EP IMPLANTABLE DEVICE N/A 03/21/2016   Procedure: ICD Generator Changeout;  Surgeon: Deboraha Sprang, MD;  Location: [facility] CV LAB;  Service: Cardiovascular;  Laterality: N/A;  . HERNIA REPAIR    . LEFT AND RIGHT HEART CATHETERIZATION WITH CORONARY/GRAFT ANGIOGRAM N/A 02/01/2014   Procedure: LEFT AND RIGHT HEART CATHETERIZATION WITH Beatrix Fetters;  Surgeon: Sinclair Grooms, MD;  Location: [facility] CATH LAB;   Service: Cardiovascular;  Laterality: N/A;  . left elbow tendon surgery    . LEFT HEART CATH AND CORS/GRAFTS ANGIOGRAPHY N/A 01/22/2018   Procedure: LEFT HEART CATH AND CORS/GRAFTS ANGIOGRAPHY;  Surgeon: Leonie Man, MD;  Location: [facility] CV LAB;  Service: Cardiovascular;  Laterality: N/A;  . TEE WITHOUT CARDIOVERSION N/A 03/23/2014   Procedure: TRANSESOPHAGEAL ECHOCARDIOGRAM (TEE);  Surgeon: Burnell Blanks, MD;  Location: [facility];  Service: Open Heart Surgery;  Laterality: N/A;  . TONSILLECTOMY    . TRANSCATHETER AORTIC VALVE REPLACEMENT, TRANSFEMORAL N/A 03/23/2014   Procedure: TRANSCATHETER AORTIC VALVE REPLACEMENT, TRANSFEMORAL;  Surgeon: Burnell Blanks, MD;  Location: [facility];  Service: Open Heart Surgery;  Laterality: N/A;        Home Medications    Prior to Admission medications   Medication Sig Start Date End Date Taking? Authorizing Provider  acetaminophen (TYLENOL) 500 MG tablet Take 500 mg by mouth every 6 (six) hours as needed for moderate pain or headache.   Yes [provider]  amiodarone (PACERONE) 200 MG tablet Take 1 tablet (200 mg total) by mouth daily. Patient taking differently: Take 200 mg by mouth 2 (two) times daily.  02/22/18  Yes Simmons, Brittainy M, PA-C  amLODipine (NORVASC) 5 MG tablet TAKE 1 TABLET BY MOUTH DAILY. Patient taking differently: Take 5 mg by mouth daily.  12/23/17  Yes Burnell Blanks, MD  b complex vitamins capsule Take 1 capsule by mouth daily. 09/02/18  Yes Burns, Claudina Lick, MD  Calcium Carbonate Antacid (TUMS PO) Take 2 tablets by mouth 2 (two) times daily as needed (indigestion/ acid reflux).   Yes [provider]  carvedilol (COREG) 6.25 MG tablet Take 1 tablet (6.25 mg total) by mouth 2 (two) times daily with a meal for 30 days. 08/11/18 09/10/18 Yes Ghimire, Dante Gang, MD  Coenzyme Q10 (COQ10) 100 MG CAPS Take 100 mg by mouth at bedtime.   Yes [provider]  diazepam (VALIUM) 5 MG tablet TAKE  1 TABLET BY MOUTH ONCE DAILY AS NEEDED Patient taking differently: Take 5 mg by mouth at bedtime as needed for sedation.  05/19/18  Yes Burnell Blanks, MD  ferrous sulfate 325 (65 FE) MG tablet Take 1 tablet (325 mg total) by mouth daily with breakfast. 08/10/18  Yes Burns, Claudina Lick, MD  furosemide (LASIX) 40 MG tablet TAKE 1 TABLET (40 MG TOTAL) BY MOUTH DAILY. 07/31/18  Yes Burnell Blanks, MD  Krill Oil 500 MG CAPS Take 500 mg by mouth at bedtime.   Yes [provider]  Magnesium 400 MG CAPS Take 400 mg by mouth at bedtime.   Yes [provider]  Melatonin 1 MG TABS Take 3 mg by mouth at bedtime as needed (sleep).    Yes [provider]  Multiple Vitamin (MULTIVITAMIN WITH MINERALS) TABS tablet Take 1 tablet by mouth daily.   Yes [provider]  nitroGLYCERIN (NITROSTAT) 0.4 MG SL tablet Place 1 tablet (0.4 mg total) under the tongue every 5 (five) minutes as needed for chest pain (  for chest pain (MAX of 3 doses)). 04/12/15  Yes Burnell Blanks, MD  Potassium 99 MG TABS Take 99 mg by mouth 2 (two) times daily.   Yes [provider]  Propylene Glycol (SYSTANE COMPLETE) 0.6 % SOLN Place 1 drop into both eyes daily as needed (irritation).   Yes [provider]  rosuvastatin (CRESTOR) 40 MG tablet TAKE 1 TABLET BY MOUTH DAILY. Patient taking differently: Take 40 mg by mouth daily with supper.  12/23/17  Yes Burnell Blanks, MD  senna (SENOKOT) 8.6 MG TABS tablet Take 1 tablet by mouth daily as needed for mild constipation.   Yes [provider]  trospium (SANCTURA) 20 MG tablet Take 20 mg by mouth 2 (two) times daily.   Yes [provider]  XARELTO 15 MG TABS tablet TAKE 1 TABLET (15 MG TOTAL) BY MOUTH DAILY WITH SUPPER. Patient taking differently: Take 15 mg by mouth daily with supper.  08/22/18  Yes Deboraha Sprang, MD    Family History Family History  Problem Relation Age of Onset  . Heart attack  Father 44  . Coronary artery disease Other        family history is dignificant for early CAD in several members    Social History Social History   Tobacco Use  . Smoking status: Former Smoker    Last attempt to quit: 10/25/1952    Years since quitting: 65.8  . Smokeless tobacco: Never Used  Substance Use Topics  . Alcohol use: Yes    Alcohol/week: 5.0 - 6.0 standard drinks    Types: 5 - 6 Glasses of wine per week    Comment: 5-6 times a week  . Drug use: No     Allergies   Cyclobenzaprine hcl and Tetracycline   Review of Systems Review of Systems  Constitutional: Negative for chills, diaphoresis and fever.  Respiratory: Positive for cough and shortness of breath.   Cardiovascular: Positive for leg swelling. Negative for chest pain.  Gastrointestinal: Negative for abdominal pain, diarrhea, nausea and vomiting.  Neurological: Negative for dizziness, syncope, weakness, light-headedness and numbness.  All other systems reviewed and are negative.    Physical Exam Updated Vital Signs BP (!) 123/57 (BP Location: Right Arm)   Pulse 62   Temp 98.8 F (37.1 C) (Oral)   Ht 5\' 7"  (1.702 m)   Wt 68.5 kg   SpO2 (!) 79%   BMI 23.65 kg/m   Physical Exam Vitals signs and nursing note reviewed.  Constitutional:      General: He is not in acute distress.    Appearance: He is well-developed. He is not diaphoretic.  HENT:     Head: Normocephalic and atraumatic.     Mouth/Throat:     Mouth: Mucous membranes are moist.     Pharynx: Oropharynx is clear.  Eyes:     Conjunctiva/sclera: Conjunctivae normal.  Neck:     Musculoskeletal: Neck supple.  Cardiovascular:     Rate and Rhythm: Normal rate and regular rhythm.     Pulses: Normal pulses.          Radial pulses are 2+ on the right side and 2+ on the left side.       Posterior tibial pulses are 2+ on the right side and 2+ on the left side.     Heart sounds: Normal heart sounds.     Comments: Tactile temperature in the  extremities appropriate and equal bilaterally. Pulmonary:     Breath sounds: Decreased  breath sounds and wheezing present.     Comments: Some increased work of breathing.  Patient reportedly 79% SPO2 on room air upon arrival.  During my exam, patient SPO2 88% on 4 L supplemental O2. Abdominal:     Palpations: Abdomen is soft.     Tenderness: There is no abdominal tenderness. There is no guarding.  Musculoskeletal:     Right lower leg: 1+ Pitting Edema present.     Left lower leg: 1+ Pitting Edema present.     Comments: No tenderness, erythema, or increased warmth to the bilateral lower extremities or over the deep venous system.  Lymphadenopathy:     Cervical: No cervical adenopathy.  Skin:    General: Skin is warm and dry.  Neurological:     Mental Status: He is alert.  Psychiatric:        Mood and Affect: Mood and affect normal.        Speech: Speech normal.        Behavior: Behavior normal.      ED Treatments / Results  Labs (all labs ordered are listed, but only abnormal results are displayed) Labs Reviewed  CBC WITH DIFFERENTIAL/PLATELET - Abnormal; Notable for the following components:      Result Value   Hemoglobin 10.2 (*)    HCT 34.6 (*)    MCV 77.4 (*)    MCH 22.8 (*)    MCHC 29.5 (*)    RDW 29.8 (*)    Platelets 145 (*)    All other components within normal limits  BRAIN NATRIURETIC PEPTIDE - Abnormal; Notable for the following components:   B Natriuretic Peptide 2,559.0 (*)    All other components within normal limits  COMPREHENSIVE METABOLIC PANEL - Abnormal; Notable for the following components:   CO2 21 (*)    Glucose, Bld 159 (*)    BUN 36 (*)    Creatinine, Ser 2.28 (*)    Calcium 10.8 (*)    AST 51 (*)    GFR calc non Af Amer 26 (*)    GFR calc Af Amer 30 (*)    All other components within normal limits  TROPONIN I - Abnormal; Notable for the following components:   Troponin I 5.65 (*)    All other components within normal limits  POCT I-STAT  7, (LYTES, BLD GAS, ICA,H+H) - Abnormal; Notable for the following components:   pH, Arterial 7.469 (*)    pCO2 arterial 29.9 (*)    pO2, Arterial 62.0 (*)    HCT 34.0 (*)    Hemoglobin 11.6 (*)    All other components within normal limits  SARS CORONAVIRUS 2 (HOSPITAL ORDER, [facility])  CULTURE, BLOOD (ROUTINE X 2)  CULTURE, BLOOD (ROUTINE X 2)  SEDIMENTATION RATE  I-STAT ARTERIAL BLOOD GAS, ED    EKG EKG Interpretation  Date/Time:  Tuesday Sep 02 2018 11:21:00 EDT Ventricular Rate:  60 PR Interval:    QRS Duration: 236 QT Interval:  526 QTC Calculation: 526 R Axis:   -68 Text Interpretation:  Paced rhythm IVCD, consider atypical RBBB LVH with secondary repolarization abnormality No significant change since last tracing Confirmed by Duffy Bruce [phone]) on 09/02/2018 12:16:53 PM   Radiology Dg Chest Port 1 View  Result Date: 09/02/2018 CLINICAL DATA:  Shortness of breath and cough EXAM: PORTABLE CHEST 1 VIEW COMPARISON:  08/09/2018 FINDINGS: Cardiomegaly. There has been prior CABG and transcatheter aortic valve replacement. Vascular pedicle widening with Awanda Mink lines and probable  small pleural effusions. Dual-chamber ICD/pacer leads from the left in stable position. IMPRESSION: CHF pattern. Electronically Signed   By: Monte Fantasia M.D.   On: 09/02/2018 10:24    Procedures .Critical Care Performed by: Lorayne Bender, PA-C Authorized by: Lorayne Bender, PA-C   Critical care provider statement:    Critical care time (minutes):  35   Critical care time was exclusive of:  Separately billable procedures and treating other patients   Critical care was necessary to treat or prevent imminent or life-threatening deterioration of the following conditions:  Cardiac failure   Critical care was time spent personally by me on the following activities:  Development of treatment plan with patient or surrogate, discussions with consultants, evaluation of  patient's response to treatment, examination of patient, obtaining history from patient or surrogate, ordering and performing treatments and interventions, ordering and review of laboratory studies, ordering and review of radiographic studies, pulse oximetry, re-evaluation of patient's condition and review of old charts   I assumed direction of critical care for this patient from another provider in my specialty: no     (including critical care time)  Medications Ordered in ED Medications  AeroChamber Plus Flo-Vu Large MISC (has no administration in time range)  albuterol (VENTOLIN HFA) 108 (90 Base) MCG/ACT inhaler 4 puff (4 puffs Inhalation Given 09/02/18 1116)  AeroChamber Plus Flo-Vu Large MISC 1 each (1 each Other Given 09/02/18 1116)  furosemide (LASIX) injection 40 mg (40 mg Intravenous Given 09/02/18 1112)  aspirin chewable tablet 324 mg (324 mg Oral Given 09/02/18 1327)     Initial Impression / Assessment and Plan / ED Course  I have reviewed the triage vital signs and the nursing notes.  Pertinent labs & imaging results that were available during my care of the patient were reviewed by me and considered in my medical decision making (see chart for details).  Clinical Course as of Sep 02 1522  Tue Sep 02, 2018  1220 Patient voices improvement in shortness of breath.  Wheezing has resolved.  SPO2 94% on high flow nasal cannula at 15 L.   [SJ]  4431 VQMGQ with Wannetta Sender, Water engineer. Will contact cardiology team.    [SJ]    Clinical Course User Index [SJ] Shandria Clinch C, PA-C       Patient presents with shortness of breath.  He has some increased work of breathing and noted to be hypoxic on room air.  Chest x-ray consistent with CHF exacerbation.  Positive troponin.  Significantly elevated BNP.  Coronavirus COVID-19 test negative. SPO2 improved with high flow nasal cannula.  Patient admitted via cardiology service.   Findings and plan of care discussed with Duffy Bruce, MD. Dr.  Ellender Hose personally evaluated and examined this patient.  Vitals:   09/02/18 1430 09/02/18 1445 09/02/18 1500 09/02/18 1515  BP: 112/74 (!) 109/58 (!) 116/58 111/61  Pulse: 60 60 60 62  Resp: (!) 30 (!) 25 (!) 24 (!) 27  Temp:      TempSrc:      SpO2: 94% 95% 95% 93%  Weight:      Height:         Final Clinical Impressions(s) / ED Diagnoses   Final diagnoses:  Shortness of breath  Acute on chronic congestive heart failure, unspecified heart failure type [facility])    ED Discharge Orders    None       Layla Maw 09/02/18 1528    Duffy Bruce, MD 09/03/18 2020

## 2018-09-02 NOTE — ED Notes (Signed)
This RN acting as Art therapist and asked pt if he would like for me to contact family/friends. Pt requested his wife, [name] be updated. I was able to talk with her and let her know pt's plan of care. Pt will be admitted. Will update pt's wife once he has a bed on a unit

## 2018-09-03 ENCOUNTER — Telehealth: Payer: Self-pay | Admitting: Cardiovascular Disease

## 2018-09-03 ENCOUNTER — Encounter (HOSPITAL_COMMUNITY): Payer: Self-pay | Admitting: *Deleted

## 2018-09-03 ENCOUNTER — Telehealth: Payer: Medicare Other | Admitting: Cardiology

## 2018-09-03 DIAGNOSIS — N189 Chronic kidney disease, unspecified: Secondary | ICD-10-CM

## 2018-09-03 LAB — RENAL FUNCTION PANEL
Albumin: 3.7 g/dL (ref 3.5–5.0)
Anion gap: 12 (ref 5–15)
BUN: 41 mg/dL — ABNORMAL HIGH (ref 8–23)
CO2: 22 mmol/L (ref 22–32)
Calcium: 10.4 mg/dL — ABNORMAL HIGH (ref 8.9–10.3)
Chloride: 102 mmol/L (ref 98–111)
Creatinine, Ser: 2.39 mg/dL — ABNORMAL HIGH (ref 0.61–1.24)
GFR calc Af Amer: 28 mL/min — ABNORMAL LOW (ref >60)
GFR calc non Af Amer: 24 mL/min — ABNORMAL LOW (ref >60)
Glucose, Bld: 166 mg/dL — ABNORMAL HIGH (ref 70–99)
Phosphorus: 3.8 mg/dL (ref 2.5–4.6)
Potassium: 4.2 mmol/L (ref 3.5–5.1)
Sodium: 136 mmol/L (ref 135–145)

## 2018-09-03 LAB — CBC
HCT: 34.6 % — ABNORMAL LOW (ref 39.0–52.0)
Hemoglobin: 10.4 g/dL — ABNORMAL LOW (ref 13.0–17.0)
MCH: 23.1 pg — ABNORMAL LOW (ref 26.0–34.0)
MCHC: 30.1 g/dL (ref 30.0–36.0)
MCV: 76.9 fL — ABNORMAL LOW (ref 80.0–100.0)
Platelets: 139 10*3/uL — ABNORMAL LOW (ref 150–400)
RBC: 4.5 MIL/uL (ref 4.22–5.81)
RDW: 30.1 % — ABNORMAL HIGH (ref 11.5–15.5)
WBC: 11.5 10*3/uL — ABNORMAL HIGH (ref 4.0–10.5)
nRBC: 0 % (ref 0.0–0.2)

## 2018-09-03 MED ORDER — FUROSEMIDE 10 MG/ML IJ SOLN
80.0000 mg | Freq: Two times a day (BID) | INTRAMUSCULAR | Status: DC
  Administered 2018-09-03 – 2018-09-04 (×3): 80 mg via INTRAVENOUS
  Filled 2018-09-03 (×4): qty 8

## 2018-09-03 MED ORDER — SODIUM CHLORIDE 0.9 % IV SOLN
510.0000 mg | INTRAVENOUS | Status: DC
  Administered 2018-09-03: 510 mg via INTRAVENOUS
  Filled 2018-09-03: qty 17

## 2018-09-03 NOTE — Progress Notes (Signed)
Pt was removed from bipap for break at 4am check time. At 440, Rt called for pt desat to mid 80s on salter @ 10lpm.  RN increased to 15LPM until RT arrived.  Rt replaced bipap for now to maintain o2 levels appropriate for this pt. RT will monitor.

## 2018-09-03 NOTE — Progress Notes (Signed)
Progress Note  Patient Name: [name] Date of Encounter: 09/03/2018  Primary Cardiologist: Lauree Chandler, MD   Subjective   Off Bipap this morning. Now tolerating HFNC. Feels he is better somewhat better.   Inpatient Medications    Scheduled Meds:  amiodarone  200 mg Oral Daily   amLODipine  5 mg Oral Daily   carvedilol  6.25 mg Oral BID WC   furosemide  40 mg Intravenous BID   Rivaroxaban  15 mg Oral Q supper   rosuvastatin  40 mg Oral Daily   sodium chloride flush  3 mL Intravenous Q12H   Continuous Infusions:  sodium chloride     PRN Meds: sodium chloride, acetaminophen, ondansetron (ZOFRAN) IV, sodium chloride flush   Vital Signs    Vitals:   09/03/18 0535 09/03/18 0540 09/03/18 0751 09/03/18 0754  BP: (!) 114/58     Pulse: 64 72 64   Resp: (!) 21  (!) 27   Temp: (!) 97.4 F (36.3 C)     TempSrc: Oral     SpO2:  95% 96% 90%  Weight:  69.3 kg    Height:        Intake/Output Summary (Last 24 hours) at 09/03/2018 0914 Last data filed at 09/02/2018 2221 Gross per 24 hour  Intake 3 ml  Output 300 ml  Net -297 ml   Last 3 Weights 09/03/2018 09/02/2018 08/15/2018  Weight (lbs) 152 lb 11.2 oz 151 lb 151 lb 1.6 oz  Weight (kg) 69.264 kg 68.493 kg 68.539 kg      Telemetry    vpaced - Personally Reviewed  ECG    N/a - Personally Reviewed  Physical Exam   GEN: No acute distress. Tolerating HFNC  Neck: No JVD Cardiac: RRR, + murmur, rubs, or gallops.  Respiratory: Diminished with crackles in lower lungs bilaterally GI: Soft, nontender, non-distended  MS: +1 pitting edema to LLE; No deformity. Neuro:  Nonfocal  Psych: Normal affect   Labs    Chemistry Recent Labs  Lab 09/02/18 1031 09/02/18 1046 09/02/18 2048 09/03/18 0606  NA 137 139  --  136  K 4.6 4.6  --  4.2  CL  --  104  --  102  CO2  --  21*  --  22  GLUCOSE  --  159*  --  166*  BUN  --  36*  --  41*  CREATININE  --  2.28* 2.35* 2.39*  CALCIUM  --  10.8*  --   10.4*  PROT  --  6.5  --   --   ALBUMIN  --  3.8  --  3.7  AST  --  51*  --   --   ALT  --  37  --   --   ALKPHOS  --  58  --   --   BILITOT  --  0.8  --   --   GFRNONAA  --  26* 25* 24*  GFRAA  --  30* 29* 28*  ANIONGAP  --  14  --  12     Hematology Recent Labs  Lab 09/02/18 1046 09/02/18 2048 09/03/18 0606  WBC 9.2 8.1 11.5*  RBC 4.47 4.21* 4.50  HGB 10.2* 9.9* 10.4*  HCT 34.6* 32.1* 34.6*  MCV 77.4* 76.2* 76.9*  MCH 22.8* 23.5* 23.1*  MCHC 29.5* 30.8 30.1  RDW 29.8* 29.8* 30.1*  PLT 145* 128* 139*    Cardiac Enzymes Recent Labs  Lab 09/02/18 1046  TROPONINI  5.65*   No results for input(s): TROPIPOC in the last 168 hours.   BNP Recent Labs  Lab 09/02/18 1004  BNP 2,559.0*     DDimer No results for input(s): DDIMER in the last 168 hours.   Radiology    Dg Chest Port 1 View  Result Date: 09/02/2018 CLINICAL DATA:  Shortness of breath and cough EXAM: PORTABLE CHEST 1 VIEW COMPARISON:  08/09/2018 FINDINGS: Cardiomegaly. There has been prior CABG and transcatheter aortic valve replacement. Vascular pedicle widening with Kerley lines and probable small pleural effusions. Dual-chamber ICD/pacer leads from the left in stable position. IMPRESSION: CHF pattern. Electronically Signed   By: Monte Fantasia M.D.   On: 09/02/2018 10:24    Cardiac Studies   N/a   Patient Profile     85 y.o. male with history of persistent atrial fibrillation on Xarelto, CAD s/p CABG (1982 with redo 1994), ischemic cardiomyopathy, out-of-hospital cardiac arrest in 2011 with successful resuscitation and subsequent placement of [employer]tr[employer] ICD followed by Dr. Caryl Comes, aortic stenosis s/p TAVR 2015, HTN, HLD and OSA.   Assessment & Plan    1. Acute on chronic CHF exacerbation: presented with worsening dyspnea that appears to be ongoing issue.  -Echocardiogram 08/09/2018 with initial interpretation was EF 30 to 35%, however this was corrected later by Dr. Harrington Challenger who says EF is around 50 to 55%  with apical hypokinesis however the study had very poor acoustic window, severe LAE, no significant valvular issue.  -CXR and BNP with significant fluid volume overload. CXR with pleural effusions and BNP greater than 2000 -started on IV diuretics, 40 mg BID, but little UOP noted thus far. Says he has the urge to urinate this morning.Suspect will need to push diuretic more. Nephrology consulted, will defer titration of lasix to nephro with elevated Cr. -Strict I&O  2. CAD s/p CABG without angina: CABG in 1982 and redo CABG in 1994. Last cath 01/2018 with stable CAD/3 patent bypass grafts. No chest pain.  -Troponin markedly elevated today>>5.65 however looking back at prior admission history, the patient had significant troponin elevation 01/2018 in which a cardiac catheterization was performed which showed no obvious culprit to explain non-STEMI and suggestions demand ischemia in the setting of A. fib with RVR with LAD lesions leading to LAD ischemia would explain -Continue statin and beta blocker -No ASA secondary to Xarelto  3.Atrial fibrillation, persistent:  -EKG with AV pacing at 60 bpm  -On amiodarone 200mg  QD and metoprolol -Continue Xarelto 20  -He has been followed closely in the EP clinic by Dr. Caryl Comes  4. Severe Aortic valve stenosis:  -s/p TAVR December 2015>>followed by Dr. Angelena Form  -Normal functioning valve per echocardiogram 08/09/2018 -On Xarelto AF  -SBE prophylaxis prior to indicated procedures  5. HLD: -Lipids controlled. Continue statin  6. Obstructive sleep apnea: -He is using CPAP now.   7. AKI/CKD stage III: followed by Dr. Lorrene Reid with recent renal artery duplex noting possible RAS of right kidney. Cr 2.28>>2.35>>2.39  For questions or updates, please contact [facility] Please consult [email or web address] for contact info under   Signed, Reino Bellis, NP  09/03/2018, 9:14 AM

## 2018-09-03 NOTE — Telephone Encounter (Signed)
Thanks

## 2018-09-03 NOTE — Progress Notes (Signed)
Patient is on NIV at this time tolerating it well. No distress or complications noted.  

## 2018-09-03 NOTE — Telephone Encounter (Signed)
I returned call and spoke with pt's daughter Maree Erie).  Pt is currently in hospital and being seen by Dr. Martin[place].  Mia reports pt has been having trouble with memory lately and cannot relay information to them regarding his condition and what the doctor is telling him.  Mia is asking if pt's wife can be called next time pt is seen to update them.  Phone number is [phone].  I told Mia I would send message to our team in the hospital.

## 2018-09-03 NOTE — Progress Notes (Addendum)
Patient ID: [name], male   DOB: 06/24/1933, 85 y.o.   MRN: [id number] S: Has the urge to urinate but was unable this morning.  Bladder scan performed by RN revealed >370 cc's  O:BP (!) 114/58 (BP Location: Left Arm)   Pulse 64   Temp (!) 97.4 F (36.3 C) (Oral)   Resp (!) 27   Ht 5\' 7"  (1.702 m)   Wt 69.3 kg   SpO2 90%   BMI 23.92 kg/m   Intake/Output Summary (Last 24 hours) at 09/03/2018 1053 Last data filed at 09/03/2018 0900 Gross per 24 hour  Intake 121 ml  Output 300 ml  Net -179 ml   Intake/Output: I/O last 3 completed shifts: In: 3 [I.V.:3] Out: 300 [Urine:300]  Intake/Output this shift:  Total I/O In: 118 [P.O.:118] Out: -  Weight change:  Gen: NAD CVS: no rub Resp: decreased BS at bases Abd: +BS, soft, non-tender, non-distended but suprapubic fullness on palpation Ext: 1+ ankle edema  Recent Labs  Lab 09/02/18 1031 09/02/18 1046 09/02/18 2048 09/03/18 0606  NA 137 139  --  136  K 4.6 4.6  --  4.2  CL  --  104  --  102  CO2  --  21*  --  22  GLUCOSE  --  159*  --  166*  BUN  --  36*  --  41*  CREATININE  --  2.28* 2.35* 2.39*  ALBUMIN  --  3.8  --  3.7  CALCIUM  --  10.8*  --  10.4*  PHOS  --   --   --  3.8  AST  --  51*  --   --   ALT  --  37  --   --    Liver Function Tests: Recent Labs  Lab 09/02/18 1046 09/03/18 0606  AST 51*  --   ALT 37  --   ALKPHOS 58  --   BILITOT 0.8  --   PROT 6.5  --   ALBUMIN 3.8 3.7   No results for input(s): LIPASE, AMYLASE in the last 168 hours. No results for input(s): AMMONIA in the last 168 hours. CBC: Recent Labs  Lab 09/02/18 1046 09/02/18 2048 09/03/18 0606  WBC 9.2 8.1 11.5*  NEUTROABS 7.7 6.0  --   HGB 10.2* 9.9* 10.4*  HCT 34.6* 32.1* 34.6*  MCV 77.4* 76.2* 76.9*  PLT 145* 128* 139*   Cardiac Enzymes: Recent Labs  Lab 09/02/18 1046  TROPONINI 5.65*   CBG: No results for input(s): GLUCAP in the last 168 hours.  Iron Studies:  Recent Labs    09/02/18 2048  IRON 19*  TIBC  375  FERRITIN 42   Studies/Results: Dg Chest Port 1 View  Result Date: 09/02/2018 CLINICAL DATA:  Shortness of breath and cough EXAM: PORTABLE CHEST 1 VIEW COMPARISON:  08/09/2018 FINDINGS: Cardiomegaly. There has been prior CABG and transcatheter aortic valve replacement. Vascular pedicle widening with Kerley lines and probable small pleural effusions. Dual-chamber ICD/pacer leads from the left in stable position. IMPRESSION: CHF pattern. Electronically Signed   By: Monte Fantasia M.D.   On: 09/02/2018 10:24   . amiodarone  200 mg Oral Daily  . amLODipine  5 mg Oral Daily  . carvedilol  6.25 mg Oral BID WC  . furosemide  40 mg Intravenous BID  . Rivaroxaban  15 mg Oral Q supper  . rosuvastatin  40 mg Oral Daily  . sodium chloride flush  3 mL Intravenous Q12H  BMET    Component Value Date/Time   NA 136 09/03/2018 0606   NA 141 02/18/2018 1611   K 4.2 09/03/2018 0606   CL 102 09/03/2018 0606   CO2 22 09/03/2018 0606   GLUCOSE 166 (H) 09/03/2018 0606   BUN 41 (H) 09/03/2018 0606   BUN 27 02/18/2018 1611   CREATININE 2.39 (H) 09/03/2018 0606   CREATININE 0.93 03/14/2016 1032   CALCIUM 10.4 (H) 09/03/2018 0606   GFRNONAA 24 (L) 09/03/2018 0606   GFRAA 28 (L) 09/03/2018 0606   CBC    Component Value Date/Time   WBC 11.5 (H) 09/03/2018 0606   RBC 4.50 09/03/2018 0606   HGB 10.4 (L) 09/03/2018 0606   HGB 9.8 (L) 02/18/2018 1611   HCT 34.6 (L) 09/03/2018 0606   HCT 30.6 (L) 02/18/2018 1611   PLT 139 (L) 09/03/2018 0606   PLT 175 02/18/2018 1611   MCV 76.9 (L) 09/03/2018 0606   MCV 83 02/18/2018 1611   MCH 23.1 (L) 09/03/2018 0606   MCHC 30.1 09/03/2018 0606   RDW 30.1 (H) 09/03/2018 0606   RDW 13.5 02/18/2018 1611   LYMPHSABS 1.1 09/02/2018 2048   LYMPHSABS 1.6 06/25/2017 1542   MONOABS 0.9 09/02/2018 2048   EOSABS 0.0 09/02/2018 2048   EOSABS 0.1 06/25/2017 1542   BASOSABS 0.0 09/02/2018 2048   BASOSABS 0.0 06/25/2017 1542     Assessment/Plan: 1.  AKI/CKD  stage 3 in setting of decompensated, combined systolic and diastolic CHF.  His Cr has been rising since his hospitalization in October 2019 for NSTEMI s/p cardiac cath and has had multiple episodes of AKI/CKD with his hospitalizations for acute CHF exacerbations.  He was evaluated by Dr. Lorrene Reid who ordered a renal artery duplex with possible RAS of the right kidney.  He has not been hypertensive and was hypotensive in the ED.   1. Agree with admission for IV diuresis and will follow his renal function. 2. Has some urinary retention by bladder scan today and asked nurse for I&O cath but may need foley if this persists 3. No significant change in renal function 2. Acute on chronic combined systolic and diastolic CHF- agree with IV lasix and follow UOP. 1. Not much of a response with 40mg  IV so will increase to 80mg  and follow bladder scans as above 3. CAD s/p CABG and redo- cath 10/19 revealed stable CAD.  Elevated troponin but no angina. 4. Urinary retention- as above I&O cath but may need foley if persists. 5. Ischemic CMP s/p AICD 6. Permanent atrial fibrillation- s/p failed cardioversion on 08/21/18.  On xarelto  7. Severe aortic stenosis s/p TAVR 12/15 with normal functioning valve by ECHO 08/09/18 8. HTN- BP is soft, hold meds and follow with diuresis 9. Anemia of CKD- low iron stores 1. Replete with IV feraheme 10. Possible right renal artery stenosis- unable to check MRI due to AICD and would hold off on renal angiogram with AKI.  Continue to follow BP's 11. Disposition- pt has been clinically declining over the past month.  Consider palliative care consult to help set goals/limits of care. 12. Hypercalcemia- check vit D levels and iPTH.  Avoid any vit D or calcium supplementation.  Will check SPEP/UPEP given AKI, anemia, and hypercalcemia.  Donetta Potts, MD [employer] [phone]

## 2018-09-03 NOTE — Progress Notes (Signed)
Bladder scan this morning for over 300 mls of urine. Straight catheter in and out for 425 mls clear yellow urine. Bladder scan later was 147 and patient denied any discomfort. Condom catheter on and draining clear yellow urine.

## 2018-09-03 NOTE — Telephone Encounter (Signed)
Wife of Pt called. Pt is in the hospital, and wanted to relay some information to Dr. Angelena Form. She would like to talk to Enis Slipper if possible

## 2018-09-04 LAB — RENAL FUNCTION PANEL
Albumin: 3.1 g/dL — ABNORMAL LOW (ref 3.5–5.0)
Anion gap: 9 (ref 5–15)
BUN: 39 mg/dL — ABNORMAL HIGH (ref 8–23)
CO2: 24 mmol/L (ref 22–32)
Calcium: 9.8 mg/dL (ref 8.9–10.3)
Chloride: 104 mmol/L (ref 98–111)
Creatinine, Ser: 2.06 mg/dL — ABNORMAL HIGH (ref 0.61–1.24)
GFR calc Af Amer: 34 mL/min — ABNORMAL LOW (ref >60)
GFR calc non Af Amer: 29 mL/min — ABNORMAL LOW (ref >60)
Glucose, Bld: 101 mg/dL — ABNORMAL HIGH (ref 70–99)
Phosphorus: 3.8 mg/dL (ref 2.5–4.6)
Potassium: 3.4 mmol/L — ABNORMAL LOW (ref 3.5–5.1)
Sodium: 137 mmol/L (ref 135–145)

## 2018-09-04 LAB — PROTEIN ELECTROPHORESIS, SERUM
A/G Ratio: 1.1 (ref 0.7–1.7)
Albumin ELP: 3.1 g/dL (ref 2.9–4.4)
Alpha-1-Globulin: 0.3 g/dL (ref 0.0–0.4)
Alpha-2-Globulin: 0.8 g/dL (ref 0.4–1.0)
Beta Globulin: 0.9 g/dL (ref 0.7–1.3)
Gamma Globulin: 0.7 g/dL (ref 0.4–1.8)
Globulin, Total: 2.8 g/dL (ref 2.2–3.9)
Total Protein ELP: 5.9 g/dL — ABNORMAL LOW (ref 6.0–8.5)

## 2018-09-04 LAB — PARATHYROID HORMONE, INTACT (NO CA): PTH: 94 pg/mL — ABNORMAL HIGH (ref 15–65)

## 2018-09-04 LAB — VITAMIN D 25 HYDROXY (VIT D DEFICIENCY, FRACTURES): Vit D, 25-Hydroxy: 37.2 ng/mL (ref 30.0–100.0)

## 2018-09-04 MED ORDER — POTASSIUM CHLORIDE CRYS ER 20 MEQ PO TBCR
40.0000 meq | EXTENDED_RELEASE_TABLET | Freq: Once | ORAL | Status: AC
  Administered 2018-09-04: 40 meq via ORAL
  Filled 2018-09-04: qty 2

## 2018-09-04 MED ORDER — PNEUMOCOCCAL VAC POLYVALENT 25 MCG/0.5ML IJ INJ
0.5000 mL | INJECTION | INTRAMUSCULAR | Status: DC
  Filled 2018-09-04: qty 0.5

## 2018-09-04 NOTE — Progress Notes (Signed)
Patient ID: [name], male   DOB: 1933-11-27, 85 y.o.   MRN: [id number] S: Feels better this morning O:BP 108/64   Pulse 95   Temp 97.9 F (36.6 C)   Resp 20   Ht 5\' 7"  (1.702 m)   Wt 68.1 kg   SpO2 97%   BMI 23.51 kg/m   Intake/Output Summary (Last 24 hours) at 09/04/2018 1101 Last data filed at 09/04/2018 0924 Gross per 24 hour  Intake 562 ml  Output 1175 ml  Net -613 ml   Intake/Output: I/O last 3 completed shifts: In: 128 [P.O.:358; I.V.:3; IV Piggyback:100] Out: 1725 [Urine:1725]  Intake/Output this shift:  Total I/O In: 222 [P.O.:222] Out: 175 [Urine:175] Weight change: -0.408 kg Gen: NAD CVS: no rub Resp: cta  Abd: benign Ext: trace ankle edema  Recent Labs  Lab 09/02/18 1031 09/02/18 1046 09/02/18 2048 09/03/18 0606 09/04/18 0348  NA 137 139  --  136 137  K 4.6 4.6  --  4.2 3.4*  CL  --  104  --  102 104  CO2  --  21*  --  22 24  GLUCOSE  --  159*  --  166* 101*  BUN  --  36*  --  41* 39*  CREATININE  --  2.28* 2.35* 2.39* 2.06*  ALBUMIN  --  3.8  --  3.7 3.1*  CALCIUM  --  10.8*  --  10.4* 9.8  PHOS  --   --   --  3.8 3.8  AST  --  51*  --   --   --   ALT  --  37  --   --   --    Liver Function Tests: Recent Labs  Lab 09/02/18 1046 09/03/18 0606 09/04/18 0348  AST 51*  --   --   ALT 37  --   --   ALKPHOS 58  --   --   BILITOT 0.8  --   --   PROT 6.5  --   --   ALBUMIN 3.8 3.7 3.1*   No results for input(s): LIPASE, AMYLASE in the last 168 hours. No results for input(s): AMMONIA in the last 168 hours. CBC: Recent Labs  Lab 09/02/18 1046 09/02/18 2048 09/03/18 0606  WBC 9.2 8.1 11.5*  NEUTROABS 7.7 6.0  --   HGB 10.2* 9.9* 10.4*  HCT 34.6* 32.1* 34.6*  MCV 77.4* 76.2* 76.9*  PLT 145* 128* 139*   Cardiac Enzymes: Recent Labs  Lab 09/02/18 1046  TROPONINI 5.65*   CBG: No results for input(s): GLUCAP in the last 168 hours.  Iron Studies:  Recent Labs    09/02/18 2048  IRON 19*  TIBC 375  FERRITIN 42    Studies/Results: No results found. Marland Kitchen amiodarone  200 mg Oral Daily  . amLODipine  5 mg Oral Daily  . carvedilol  6.25 mg Oral BID WC  . furosemide  80 mg Intravenous BID  . [START ON 09/05/2018] pneumococcal 23 valent vaccine  0.5 mL Intramuscular Tomorrow-1000  . Rivaroxaban  15 mg Oral Q supper  . rosuvastatin  40 mg Oral Daily  . sodium chloride flush  3 mL Intravenous Q12H    BMET    Component Value Date/Time   NA 137 09/04/2018 0348   NA 141 02/18/2018 1611   K 3.4 (L) 09/04/2018 0348   CL 104 09/04/2018 0348   CO2 24 09/04/2018 0348   GLUCOSE 101 (H) 09/04/2018 0348   BUN 39 (H)  09/04/2018 0348   BUN 27 02/18/2018 1611   CREATININE 2.06 (H) 09/04/2018 0348   CREATININE 0.93 03/14/2016 1032   CALCIUM 9.8 09/04/2018 0348   GFRNONAA 29 (L) 09/04/2018 0348   GFRAA 34 (L) 09/04/2018 0348   CBC    Component Value Date/Time   WBC 11.5 (H) 09/03/2018 0606   RBC 4.50 09/03/2018 0606   HGB 10.4 (L) 09/03/2018 0606   HGB 9.8 (L) 02/18/2018 1611   HCT 34.6 (L) 09/03/2018 0606   HCT 30.6 (L) 02/18/2018 1611   PLT 139 (L) 09/03/2018 0606   PLT 175 02/18/2018 1611   MCV 76.9 (L) 09/03/2018 0606   MCV 83 02/18/2018 1611   MCH 23.1 (L) 09/03/2018 0606   MCHC 30.1 09/03/2018 0606   RDW 30.1 (H) 09/03/2018 0606   RDW 13.5 02/18/2018 1611   LYMPHSABS 1.1 09/02/2018 2048   LYMPHSABS 1.6 06/25/2017 1542   MONOABS 0.9 09/02/2018 2048   EOSABS 0.0 09/02/2018 2048   EOSABS 0.1 06/25/2017 1542   BASOSABS 0.0 09/02/2018 2048   BASOSABS 0.0 06/25/2017 1542    Assessment/Plan: 1. AKI/CKD stage 3 in setting of decompensated, combined systolic and diastolic CHF. His Cr has been rising since his hospitalization in October 2019 for NSTEMI s/p cardiac cath and has had multiple episodes of AKI/CKD with his hospitalizations for acute CHF exacerbations. He was evaluated by Dr. Lorrene Reid who ordered a renal artery duplex with possible RAS of the right kidney. He has not been hypertensive  and was hypotensive in the ED.  1. Had urinary retention by bladder scan s/p foley cath placement with good uop 2. Slight improvement in BUN/Cr overnight 3. Continue to diurese and monitor renal function. 2. Acute on chronic combined systolic and diastolic CHF- agree with IV lasix and follow UOP. 1. Not much of a response with 40mg  IV so dose was increased to 80 mg with improved UOP. 3. CAD s/p CABG and redo- cath 10/19 revealed stable CAD. Elevated troponin but no angina. 4. Urinary retention- as above I&O cath but may need foley if persists. 5. Ischemic CMP s/p AICD 6. Permanent atrial fibrillation- s/p failed cardioversion on 08/21/18. On xarelto  7. Severe aortic stenosis s/p TAVR 12/15 with normal functioning valve by ECHO 08/09/18 8. HTN- BP is soft, hold meds and follow with diuresis 9. Anemia of CKD- low iron stores 1. Replete with IV feraheme 10. Possible right renal artery stenosis- unable to check MRI due to AICD and would hold off on renal angiogram with AKI. Continue to follow BP's 11. Disposition- pt has been clinically declining over the past month. Consider palliative care consult to help set goals/limits of care. 12. Hypercalcemia- improved.  Vit D levels normal and iPTH pending. Avoid any vit D or calcium supplementation. SPEP/UPEP ordered given AKI, anemia, and hypercalcemia. results pending  Donetta Potts, MD [facility] [phone]

## 2018-09-04 NOTE — Progress Notes (Signed)
Progress Note  Patient Name: [name] Date of Encounter: 09/04/2018  Primary Cardiologist: Lauree Chandler, MD   Subjective   Feels breathing is significantly better today. No chest pain or palpitations.   Inpatient Medications    Scheduled Meds: . amiodarone  200 mg Oral Daily  . amLODipine  5 mg Oral Daily  . carvedilol  6.25 mg Oral BID WC  . furosemide  80 mg Intravenous BID  . [START ON 09/05/2018] pneumococcal 23 valent vaccine  0.5 mL Intramuscular Tomorrow-1000  . Rivaroxaban  15 mg Oral Q supper  . rosuvastatin  40 mg Oral Daily  . sodium chloride flush  3 mL Intravenous Q12H   Continuous Infusions: . sodium chloride    . ferumoxytol 510 mg (09/03/18 2019)   PRN Meds: sodium chloride, acetaminophen, ondansetron (ZOFRAN) IV, sodium chloride flush   Vital Signs    Vitals:   09/04/18 0301 09/04/18 0635 09/04/18 0746 09/04/18 0912  BP:  103/61  108/64  Pulse: 71 93 95   Resp: 20 20 20    Temp:  97.9 F (36.6 C)    TempSrc:      SpO2: 98% 97%    Weight:  68.1 kg    Height:        Intake/Output Summary (Last 24 hours) at 09/04/2018 1423 Last data filed at 09/04/2018 0924 Gross per 24 hour  Intake 742 ml  Output 1175 ml  Net -433 ml   Last 3 Weights 09/04/2018 09/03/2018 09/02/2018  Weight (lbs) 150 lb 1.6 oz 152 lb 11.2 oz 151 lb  Weight (kg) 68.085 kg 69.264 kg 68.493 kg      Telemetry    But rate increased to 96 bpm - Personally Reviewed  ECG    N/a - Personally Reviewed  Physical Exam   GEN: No acute distress. Tolerating HFNC  Neck: No JVD Cardiac: RRR, + murmur, rubs, or gallops.  Respiratory: Diminished with crackles in lower lungs bilaterally GI: Soft, nontender, non-distended  MS: +1 pitting edema to LLE; No deformity. Neuro:  Nonfocal  Psych: Normal affect   Labs    Chemistry Recent Labs  Lab 09/02/18 1046 09/02/18 2048 09/03/18 0606 09/04/18 0348  NA 139  --  136 137  K 4.6  --  4.2 3.4*  CL 104  --  102 104   CO2 21*  --  22 24  GLUCOSE 159*  --  166* 101*  BUN 36*  --  41* 39*  CREATININE 2.28* 2.35* 2.39* 2.06*  CALCIUM 10.8*  --  10.4* 9.8  PROT 6.5  --   --   --   ALBUMIN 3.8  --  3.7 3.1*  AST 51*  --   --   --   ALT 37  --   --   --   ALKPHOS 58  --   --   --   BILITOT 0.8  --   --   --   GFRNONAA 26* 25* 24* 29*  GFRAA 30* 29* 28* 34*  ANIONGAP 14  --  12 9     Hematology Recent Labs  Lab 09/02/18 1046 09/02/18 2048 09/03/18 0606  WBC 9.2 8.1 11.5*  RBC 4.47 4.21* 4.50  HGB 10.2* 9.9* 10.4*  HCT 34.6* 32.1* 34.6*  MCV 77.4* 76.2* 76.9*  MCH 22.8* 23.5* 23.1*  MCHC 29.5* 30.8 30.1  RDW 29.8* 29.8* 30.1*  PLT 145* 128* 139*    Cardiac Enzymes Recent Labs  Lab 09/02/18 1046  TROPONINI  5.65*   No results for input(s): TROPIPOC in the last 168 hours.   BNP Recent Labs  Lab 09/02/18 1004  BNP 2,559.0*     DDimer No results for input(s): DDIMER in the last 168 hours.   Radiology    No results found.  Cardiac Studies   N/a   Patient Profile     85 y.o. male with history of persistent atrial fibrillation on Xarelto, CAD s/p CABG (1982 with redo 1994), ischemic cardiomyopathy, out-of-hospital cardiac arrest in 2011 with successful resuscitation and subsequent placement of Medtronic ICD followed by Dr. Caryl Comes, aortic stenosis s/p TAVR 2015, HTN, HLD and OSA.   Assessment & Plan    1. Acute on chronic CHF exacerbation: presented with worsening dyspnea that appears to be ongoing issue.  -Echocardiogram 08/09/2018 with initial interpretation was EF 30 to 35%, however this was corrected later by Dr. Harrington Challenger who says EF is around 50 to 55% with apical hypokinesis however the study had very poor acoustic window, severe LAE, no significant valvular issue.  -CXR and BNP with significant fluid volume overload. CXR with pleural effusions and BNP greater than 2000 -started on IV diuretics, 40 mg BID, later increased to 80  Mg bid. Urine output improved significantly with this  and placement of foley catheter. Will continue diuretic therapy. Creatinine improved today to 2.0.   2. CAD s/p CABG without angina: CABG in 1982 and redo CABG in 1994. Last cath 01/2018 with stable CAD/3 patent bypass grafts. No chest pain.  -Troponin elevated 5.65 however looking back at prior admission history, the patient had significant troponin elevation 01/2018 in which a cardiac catheterization was performed which showed no obvious culprit to explain non-STEMI and suggestions demand ischemia in the setting of A. fib with RVR with LAD lesions leading to LAD ischemia would explain -Continue statin and beta blocker -No ASA secondary to Xarelto  3.Atrial fibrillation, persistent:  -HR increased today to 90s with V pacing. Previously AV paced at rate of 60. I am concerned he may be back in Afib or flutter. Will interrogate pacemaker today to be sure -On amiodarone 200mg  QD and metoprolol -Continue Xarelto 20  -He has been followed closely in the EP clinic by Dr. Caryl Comes  4. Severe Aortic valve stenosis:  -s/p TAVR December 2015>>followed by Dr. Angelena Form  -Normal functioning valve per echocardiogram 08/09/2018 -On Xarelto AF  -SBE prophylaxis prior to indicated procedures  5. HLD: -Lipids controlled. Continue statin  6. Obstructive sleep apnea: -He is using CPAP now.   7. AKI/CKD stage III: followed by Dr. Lorrene Reid with recent renal artery duplex noting possible RAS of right kidney. Cr 2.28>>2.35>>2.39>> 2.0. Appreciate Dr Elissa Hefty assistance.   For questions or updates, please contact [place] Please consult [email or web address] for contact info under   Signed, Desmin Daleo [place], MD  09/04/2018, 2:23 PM

## 2018-09-05 LAB — RENAL FUNCTION PANEL
Albumin: 2.9 g/dL — ABNORMAL LOW (ref 3.5–5.0)
Anion gap: 12 (ref 5–15)
BUN: 40 mg/dL — ABNORMAL HIGH (ref 8–23)
CO2: 24 mmol/L (ref 22–32)
Calcium: 10 mg/dL (ref 8.9–10.3)
Chloride: 104 mmol/L (ref 98–111)
Creatinine, Ser: 2.09 mg/dL — ABNORMAL HIGH (ref 0.61–1.24)
GFR calc Af Amer: 33 mL/min — ABNORMAL LOW (ref >60)
GFR calc non Af Amer: 29 mL/min — ABNORMAL LOW (ref >60)
Glucose, Bld: 115 mg/dL — ABNORMAL HIGH (ref 70–99)
Phosphorus: 3.3 mg/dL (ref 2.5–4.6)
Potassium: 3.7 mmol/L (ref 3.5–5.1)
Sodium: 140 mmol/L (ref 135–145)

## 2018-09-05 MED ORDER — FUROSEMIDE 80 MG PO TABS
80.0000 mg | ORAL_TABLET | Freq: Two times a day (BID) | ORAL | Status: DC
  Administered 2018-09-05 – 2018-09-06 (×3): 80 mg via ORAL
  Filled 2018-09-05 (×3): qty 1

## 2018-09-05 MED ORDER — DOCUSATE SODIUM 100 MG PO CAPS
200.0000 mg | ORAL_CAPSULE | Freq: Every day | ORAL | Status: DC
  Administered 2018-09-05 – 2018-09-06 (×2): 200 mg via ORAL
  Filled 2018-09-05 (×2): qty 2

## 2018-09-05 NOTE — Progress Notes (Addendum)
Patient ID: [name], male   DOB: 21-Jun-1933, 85 y.o.   MRN: [id number] S: Feels better today O:BP (!) 121/56   Pulse 66   Temp 98 F (36.7 C) (Oral)   Resp (!) 28   Ht 5\' 7"  (1.702 m)   Wt 65.9 kg   SpO2 93%   BMI 22.76 kg/m   Intake/Output Summary (Last 24 hours) at 09/05/2018 1156 Last data filed at 09/05/2018 8416 Gross per 24 hour  Intake 462 ml  Output 850 ml  Net -388 ml   Intake/Output: I/O last 3 completed shifts: In: 6063 [P.O.:1222; IV Piggyback:100] Out: 0160 [Urine:1825]  Intake/Output this shift:  No intake/output data recorded. Weight change: -2.177 kg Gen: NAD CVS: no rub Resp: decreased BS at bases Abd: +BS, soft Ext: trace lower ext edema  Recent Labs  Lab 09/02/18 1031 09/02/18 1046 09/02/18 2048 09/03/18 0606 09/04/18 0348 09/05/18 0421  NA 137 139  --  136 137 140  K 4.6 4.6  --  4.2 3.4* 3.7  CL  --  104  --  102 104 104  CO2  --  21*  --  22 24 24   GLUCOSE  --  159*  --  166* 101* 115*  BUN  --  36*  --  41* 39* 40*  CREATININE  --  2.28* 2.35* 2.39* 2.06* 2.09*  ALBUMIN  --  3.8  --  3.7 3.1* 2.9*  CALCIUM  --  10.8*  --  10.4* 9.8 10.0  PHOS  --   --   --  3.8 3.8 3.3  AST  --  51*  --   --   --   --   ALT  --  37  --   --   --   --    Liver Function Tests: Recent Labs  Lab 09/02/18 1046 09/03/18 0606 09/04/18 0348 09/05/18 0421  AST 51*  --   --   --   ALT 37  --   --   --   ALKPHOS 58  --   --   --   BILITOT 0.8  --   --   --   PROT 6.5  --   --   --   ALBUMIN 3.8 3.7 3.1* 2.9*   No results for input(s): LIPASE, AMYLASE in the last 168 hours. No results for input(s): AMMONIA in the last 168 hours. CBC: Recent Labs  Lab 09/02/18 1046 09/02/18 2048 09/03/18 0606  WBC 9.2 8.1 11.5*  NEUTROABS 7.7 6.0  --   HGB 10.2* 9.9* 10.4*  HCT 34.6* 32.1* 34.6*  MCV 77.4* 76.2* 76.9*  PLT 145* 128* 139*   Cardiac Enzymes: Recent Labs  Lab 09/02/18 1046  TROPONINI 5.65*   CBG: No results for input(s): GLUCAP in the  last 168 hours.  Iron Studies:  Recent Labs    09/02/18 2048  IRON 19*  TIBC 375  FERRITIN 42   Studies/Results: No results found. Marland Kitchen amiodarone  200 mg Oral Daily  . amLODipine  5 mg Oral Daily  . carvedilol  6.25 mg Oral BID WC  . docusate sodium  200 mg Oral Daily  . furosemide  80 mg Oral BID  . pneumococcal 23 valent vaccine  0.5 mL Intramuscular Tomorrow-1000  . Rivaroxaban  15 mg Oral Q supper  . rosuvastatin  40 mg Oral Daily  . sodium chloride flush  3 mL Intravenous Q12H    BMET    Component Value Date/Time  NA 140 09/05/2018 0421   NA 141 02/18/2018 1611   K 3.7 09/05/2018 0421   CL 104 09/05/2018 0421   CO2 24 09/05/2018 0421   GLUCOSE 115 (H) 09/05/2018 0421   BUN 40 (H) 09/05/2018 0421   BUN 27 02/18/2018 1611   CREATININE 2.09 (H) 09/05/2018 0421   CREATININE 0.93 03/14/2016 1032   CALCIUM 10.0 09/05/2018 0421   GFRNONAA 29 (L) 09/05/2018 0421   GFRAA 33 (L) 09/05/2018 0421   CBC    Component Value Date/Time   WBC 11.5 (H) 09/03/2018 0606   RBC 4.50 09/03/2018 0606   HGB 10.4 (L) 09/03/2018 0606   HGB 9.8 (L) 02/18/2018 1611   HCT 34.6 (L) 09/03/2018 0606   HCT 30.6 (L) 02/18/2018 1611   PLT 139 (L) 09/03/2018 0606   PLT 175 02/18/2018 1611   MCV 76.9 (L) 09/03/2018 0606   MCV 83 02/18/2018 1611   MCH 23.1 (L) 09/03/2018 0606   MCHC 30.1 09/03/2018 0606   RDW 30.1 (H) 09/03/2018 0606   RDW 13.5 02/18/2018 1611   LYMPHSABS 1.1 09/02/2018 2048   LYMPHSABS 1.6 06/25/2017 1542   MONOABS 0.9 09/02/2018 2048   EOSABS 0.0 09/02/2018 2048   EOSABS 0.1 06/25/2017 1542   BASOSABS 0.0 09/02/2018 2048   BASOSABS 0.0 06/25/2017 1542     Assessment/Plan: 1. AKI/CKD stage 3 in setting of decompensated, combined systolic and diastolic CHF. His Cr has been rising since his hospitalization in October 2019 for NSTEMI s/p cardiac cath and has had multiple episodes of AKI/CKD with his hospitalizations for acute CHF exacerbations. He was evaluated by  Dr. Lorrene Reid who ordered a renal artery duplex with possible RAS of the right kidney. He has not been hypertensive and was hypotensive in the ED.  1. Had urinary retention by bladder scan s/p foley cath placement with good uop 2. Slight improvement in BUN/Cr again overnight 3. Continue to diurese and monitor renal function. 2. Acute on chronic combined systolic and diastolic CHF- agree with IV lasix and follow UOP. 1. Not much of a response with 40mg  IV so dose was increased to 80 mg with improved UOP. 2. Will transition to po lasix and follow UOP and Cr in am 3. CAD s/p CABG and redo- cath 10/19 revealed stable CAD. Elevated troponin but no angina. 4. Urinary retention- now with foley catheter.  Will need urology follow up as an outpatient and consider starting flomax if ok with Cardiology. 5. Ischemic CMP s/p AICD 6. Permanent atrial fibrillation- s/p failed cardioversion on 08/21/18. On xarelto  7. Severe aortic stenosis s/p TAVR 12/15 with normal functioning valve by ECHO 08/09/18 8. HTN- BP is soft, hold meds and follow with diuresis 9. Anemia of CKD-lowiron stores 1. Replete with IV feraheme 10. Possible right renal artery stenosis- unable to check MRI due to AICD and would hold off on renal angiogram with AKI. Continue to follow BP's 11. Disposition- pt has been clinically declining over the past month. Consider palliative care consult to help set goals/limits of care. 12. Hypercalcemia- improved.  Vit D levels normal and iPTH mildly elevated at 94. Avoid any vit D or calcium supplementation. SPEP negative UPEP pending.  ordered given AKI, anemia, and hypercalcemia.  Donetta Potts, MD [employer] [phone]

## 2018-09-05 NOTE — Consult Note (Signed)
   [facility] Inpatient Consult   09/05/2018  CARLUS STAY Apr 02, 1934 [id number]   Patient screened for high risk score for unplanned readmission score with a hospitalizations in the past 30 days in [facility] with [employer].   Review of patient's medical record reveals patient is from MD notes:  85 y.o. male with history of persistent atrial fibrillationon Xarelto, CAD s/p CABG (1982 with redo 1994), ischemic cardiomyopathy, out-of-hospital cardiac arrest in 2011 with successful resuscitation and subsequent placement of Medtronic ICDfollowed by Dr. Caryl Comes, aortic stenosis s/p [id number], HTN, HLD and OSA.  Primary Care Provider is Billey Gosling, MD  Pharmacy is:  [facility]  Plan:  Follow up with inpatient [facility] team for needs. Continue to follow progress and disposition to assess for post hospital care management needs.  Nephrology notes reviewed and will follow up for progress.   Please place a [facility] Care Management consult as appropriate and for questions contact:   Natividad Brood, RN BSN [facility] Liaison  [phone] business mobile phone Toll free office [phone]  Fax number: [phone] [email or web address]

## 2018-09-05 NOTE — Progress Notes (Signed)
Progress Note  Patient Name: [name] Date of Encounter: 09/05/2018  Primary Cardiologist: Lauree Chandler, MD   Subjective   Feels breathing is significantly better today. No chest pain or palpitations.   Inpatient Medications    Scheduled Meds: . amiodarone  200 mg Oral Daily  . amLODipine  5 mg Oral Daily  . carvedilol  6.25 mg Oral BID WC  . furosemide  80 mg Intravenous BID  . pneumococcal 23 valent vaccine  0.5 mL Intramuscular Tomorrow-1000  . Rivaroxaban  15 mg Oral Q supper  . rosuvastatin  40 mg Oral Daily  . sodium chloride flush  3 mL Intravenous Q12H   Continuous Infusions: . sodium chloride    . ferumoxytol 510 mg (09/03/18 2019)   PRN Meds: sodium chloride, acetaminophen, ondansetron (ZOFRAN) IV, sodium chloride flush   Vital Signs    Vitals:   09/04/18 2345 09/05/18 0351 09/05/18 0500 09/05/18 0815  BP: (!) 112/56  (!) 108/56 (!) 108/56  Pulse: 60 60 60 63  Resp: 18 (!) 24 18 (!) 31  Temp:   98 F (36.7 C)   TempSrc:   Oral   SpO2: 97% 95% 98% 93%  Weight:   65.9 kg   Height:        Intake/Output Summary (Last 24 hours) at 09/05/2018 0938 Last data filed at 09/05/2018 1941 Gross per 24 hour  Intake 580 ml  Output 850 ml  Net -270 ml   Last 3 Weights 09/05/2018 09/04/2018 09/03/2018  Weight (lbs) 145 lb 4.8 oz 150 lb 1.6 oz 152 lb 11.2 oz  Weight (kg) 65.908 kg 68.085 kg 69.264 kg      Telemetry    AV paced 60 bpm - Personally Reviewed  ECG    N/a - Personally Reviewed  Physical Exam   GEN: No acute distress.  Neck: No JVD Cardiac: RRR, + murmur, rubs, or gallops.  Respiratory: clear GI: Soft, nontender, non-distended  MS: Tr edema to LLE; No deformity. Neuro:  Nonfocal  Psych: Normal affect   Labs    Chemistry Recent Labs  Lab 09/02/18 1046  09/03/18 0606 09/04/18 0348 09/05/18 0421  NA 139  --  136 137 140  K 4.6  --  4.2 3.4* 3.7  CL 104  --  102 104 104  CO2 21*  --  22 24 24   GLUCOSE 159*  --  166*  101* 115*  BUN 36*  --  41* 39* 40*  CREATININE 2.28*   < > 2.39* 2.06* 2.09*  CALCIUM 10.8*  --  10.4* 9.8 10.0  PROT 6.5  --   --   --   --   ALBUMIN 3.8  --  3.7 3.1* 2.9*  AST 51*  --   --   --   --   ALT 37  --   --   --   --   ALKPHOS 58  --   --   --   --   BILITOT 0.8  --   --   --   --   GFRNONAA 26*   < > 24* 29* 29*  GFRAA 30*   < > 28* 34* 33*  ANIONGAP 14  --  12 9 12    < > = values in this interval not displayed.     Hematology Recent Labs  Lab 09/02/18 1046 09/02/18 2048 09/03/18 0606  WBC 9.2 8.1 11.5*  RBC 4.47 4.21* 4.50  HGB 10.2* 9.9* 10.4*  HCT  34.6* 32.1* 34.6*  MCV 77.4* 76.2* 76.9*  MCH 22.8* 23.5* 23.1*  MCHC 29.5* 30.8 30.1  RDW 29.8* 29.8* 30.1*  PLT 145* 128* 139*    Cardiac Enzymes Recent Labs  Lab 09/02/18 1046  TROPONINI 5.65*   No results for input(s): TROPIPOC in the last 168 hours.   BNP Recent Labs  Lab 09/02/18 1004  BNP 2,559.0*     DDimer No results for input(s): DDIMER in the last 168 hours.   Radiology    No results found.  Cardiac Studies   N/a   Patient Profile     85 y.o. male with history of persistent atrial fibrillation on Xarelto, CAD s/p CABG (1982 with redo 1994), ischemic cardiomyopathy, out-of-hospital cardiac arrest in 2011 with successful resuscitation and subsequent placement of Medtronic ICD followed by Dr. Caryl Comes, aortic stenosis s/p TAVR 2015, HTN, HLD and OSA.   Assessment & Plan    1. Acute on chronic CHF exacerbation: presented with worsening dyspnea that appears to be ongoing issue.  -Echocardiogram 08/09/2018 with initial interpretation was EF 30 to 35%, however this was corrected later by Dr. Harrington Challenger who says EF is around 50 to 55% with apical hypokinesis however the study had very poor acoustic window, severe LAE, no significant valvular issue.  -CXR and BNP with significant fluid volume overload. CXR with pleural effusions and BNP greater than 2000 -started on IV diuretics, 40 mg BID, later  increased to 80  Mg bid. Urine output improved significantly with this and placement of foley catheter.  - Creatinine stable.  - I/O negative 1 liter yesterday.  - weight down 6 lbs since admit.  - plan to transition to PO lasix today.  - possible DC in am if stable.    2. CAD s/p CABG without angina: CABG in 1982 and redo CABG in 1994. Last cath 01/2018 with stable CAD/3 patent bypass grafts. No chest pain.  -Troponin elevated 5.65 however looking back at prior admission history, the patient had significant troponin elevation 01/2018 in which a cardiac catheterization was performed which showed no obvious culprit to explain non-STEMI and suggestions demand ischemia in the setting of A. fib with RVR with LAD lesions leading to LAD ischemia would explain -Continue statin and beta blocker -No ASA secondary to Xarelto  3.Atrial fibrillation, persistent:  -Maintaining NSR- confirmed by pacer check yesterday. -On amiodarone 200mg  QD and metoprolol -Continue Xarelto 20  -He has been followed closely in the EP clinic by Dr. Caryl Comes  4. Severe Aortic valve stenosis:  -s/p TAVR December 2015>>followed by Dr. Angelena Form  -Normal functioning valve per echocardiogram 08/09/2018 -On Xarelto AF  -SBE prophylaxis prior to indicated procedures  5. HLD: -Lipids controlled. Continue statin  6. Obstructive sleep apnea: -He is using CPAP now. needs to have higher flow oxygen for device at home. Will need [place] agency to change at DC.   7. AKI/CKD stage III: followed by Dr. Lorrene Reid with recent renal artery duplex noting possible RAS of right kidney. Cr 2.28>>2.35>>2.39>> 2.0>> 2.09. Appreciate Dr Elissa Hefty assistance.   For questions or updates, please contact [place] Please consult [email or web address] for contact info under   Signed, Keyshawn Hellwig [place], MD  09/05/2018, 9:38 AM

## 2018-09-06 ENCOUNTER — Encounter (HOSPITAL_COMMUNITY): Payer: Self-pay | Admitting: Student

## 2018-09-06 DIAGNOSIS — R339 Retention of urine, unspecified: Secondary | ICD-10-CM

## 2018-09-06 DIAGNOSIS — G4733 Obstructive sleep apnea (adult) (pediatric): Secondary | ICD-10-CM

## 2018-09-06 DIAGNOSIS — I5033 Acute on chronic diastolic (congestive) heart failure: Secondary | ICD-10-CM

## 2018-09-06 DIAGNOSIS — E785 Hyperlipidemia, unspecified: Secondary | ICD-10-CM

## 2018-09-06 DIAGNOSIS — I4819 Other persistent atrial fibrillation: Secondary | ICD-10-CM

## 2018-09-06 LAB — RENAL FUNCTION PANEL
Albumin: 3 g/dL — ABNORMAL LOW (ref 3.5–5.0)
Anion gap: 13 (ref 5–15)
BUN: 34 mg/dL — ABNORMAL HIGH (ref 8–23)
CO2: 24 mmol/L (ref 22–32)
Calcium: 10.1 mg/dL (ref 8.9–10.3)
Chloride: 102 mmol/L (ref 98–111)
Creatinine, Ser: 1.83 mg/dL — ABNORMAL HIGH (ref 0.61–1.24)
GFR calc Af Amer: 39 mL/min — ABNORMAL LOW (ref >60)
GFR calc non Af Amer: 34 mL/min — ABNORMAL LOW (ref >60)
Glucose, Bld: 106 mg/dL — ABNORMAL HIGH (ref 70–99)
Phosphorus: 3.4 mg/dL (ref 2.5–4.6)
Potassium: 3 mmol/L — ABNORMAL LOW (ref 3.5–5.1)
Sodium: 139 mmol/L (ref 135–145)

## 2018-09-06 MED ORDER — POTASSIUM CHLORIDE CRYS ER 20 MEQ PO TBCR: 20.0000 meq | EXTENDED_RELEASE_TABLET | Freq: Every day | ORAL | 5 refills | Status: AC

## 2018-09-06 MED ORDER — FUROSEMIDE 80 MG PO TABS: 80.0000 mg | ORAL_TABLET | Freq: Two times a day (BID) | ORAL | 3 refills | Status: AC

## 2018-09-06 MED ORDER — POTASSIUM CHLORIDE CRYS ER 20 MEQ PO TBCR
40.0000 meq | EXTENDED_RELEASE_TABLET | Freq: Once | ORAL | Status: AC
  Administered 2018-09-06: 40 meq via ORAL
  Filled 2018-09-06: qty 2

## 2018-09-06 NOTE — Progress Notes (Signed)
SATURATION QUALIFICATIONS: (This note is used to comply with regulatory documentation for home oxygen)  Patient Saturations on Room Air at Rest = 81 %  Patient Saturations on Room Air while Ambulating not done  Patient Saturations on 8 Liters of oxygen in bed   At  94 %  Please briefly explain why patient needs home oxygen:

## 2018-09-06 NOTE — Discharge Instructions (Signed)
YOUR CARDIOLOGY TEAM HAS ARRANGED FOR AN E-VISIT FOR YOUR APPOINTMENT - PLEASE REVIEW IMPORTANT INFORMATION BELOW SEVERAL DAYS PRIOR TO YOUR APPOINTMENT ° °Due to the recent COVID-19 pandemic, we are transitioning in-person office visits to tele-medicine visits in an effort to decrease unnecessary exposure to our patients, their families, and staff. These visits are billed to your insurance just like a normal visit is. We also encourage you to sign up for MyChart if you have not already done so. You will need a smartphone if possible. For patients that do not have this, we can still complete the visit using a regular telephone but do prefer a smartphone to enable video when possible. You may have a family member that lives with you that can help. If possible, we also ask that you have a blood pressure cuff and scale at home to measure your blood pressure, heart rate and weight prior to your scheduled appointment. Patients with clinical needs that need an in-person evaluation and testing will still be able to come to the office if absolutely necessary. If you have any questions, feel free to call our office. ° ° ° °2-3 DAYS BEFORE YOUR APPOINTMENT ° °You will receive a telephone call from one of our [facility] team members - your caller ID may say "Unknown caller." If this is a video visit, we will walk you through how to get the video launched on your phone. We will remind you check your blood pressure, heart rate and weight prior to your scheduled appointment. If you have an Apple Watch or Kardia, please upload any pertinent ECG strips the day before or morning of your appointment to MyChart. Our staff will also make sure you have reviewed the consent and agree to move forward with your scheduled tele-health visit.  ° ° ° °THE DAY OF YOUR APPOINTMENT ° °Approximately 15 minutes prior to your scheduled appointment, you will receive a telephone call from one of [facility] team - your caller ID may say "Unknown caller."   Our staff will confirm medications, vital signs for the day and any symptoms you may be experiencing. Please have this information available prior to the time of visit start. It may also be helpful for you to have a pad of paper and pen handy for any instructions given during your visit. They will also walk you through joining the smartphone meeting if this is a video visit. ° ° ° °CONSENT FOR TELE-HEALTH VISIT - PLEASE REVIEW ° °I hereby voluntarily request, consent and authorize [facility] and its employed or contracted physicians, physician assistants, nurse practitioners or other licensed health care professionals (the Practitioner), to provide me with telemedicine health care services (the “Services") as deemed necessary by the treating Practitioner. I acknowledge and consent to receive the Services by the Practitioner via telemedicine. I understand that the telemedicine visit will involve communicating with the Practitioner through live audiovisual communication technology and the disclosure of certain medical information by electronic transmission. I acknowledge that I have been given the opportunity to request an in-person assessment or other available alternative prior to the telemedicine visit and am voluntarily participating in the telemedicine visit. ° °I understand that I have the right to withhold or withdraw my consent to the use of telemedicine in the course of my care at any time, without affecting my right to future care or treatment, and that the Practitioner or I may terminate the telemedicine visit at any time. I understand that I have the right to inspect all information   obtained and/or recorded in the course of the telemedicine visit and may receive copies of available information for a reasonable fee.  I understand that some of the potential risks of receiving the Services via telemedicine include:  °• Delay or interruption in medical evaluation due to technological equipment failure or  disruption; °• Information transmitted may not be sufficient (e.g. poor resolution of images) to allow for appropriate medical decision making by the Practitioner; and/or  °• In rare instances, security protocols could fail, causing a breach of personal health information. ° °Furthermore, I acknowledge that it is my responsibility to provide information about my medical history, conditions and care that is complete and accurate to the best of my ability. I acknowledge that Practitioner's advice, recommendations, and/or decision may be based on factors not within their control, such as incomplete or inaccurate data provided by me or distortions of diagnostic images or specimens that may result from electronic transmissions. I understand that the practice of medicine is not an exact science and that Practitioner makes no warranties or guarantees regarding treatment outcomes. I acknowledge that I will receive a copy of this consent concurrently upon execution via email to the email address I last provided but may also request a printed copy by calling the office of [facility].   ° °I understand that my insurance will be billed for this visit.  ° °I have read or had this consent read to me. °• I understand the contents of this consent, which adequately explains the benefits and risks of the Services being provided via telemedicine.  °• I have been provided ample opportunity to ask questions regarding this consent and the Services and have had my questions answered to my satisfaction. °• I give my informed consent for the services to be provided through the use of telemedicine in my medical care ° °By participating in this telemedicine visit I agree to the above. ° °

## 2018-09-06 NOTE — Discharge Summary (Signed)
Discharge Summary    Patient ID: [name],  MRN: [id number], DOB/AGE: May 13, 1933 85 y.o.  Admit date: 09/02/2018 Discharge date: 09/06/2018  Primary Care Provider: Binnie Rail Primary Cardiologist: Lauree Chandler, MD   Discharge Diagnoses    Principal Problem:   Acute on chronic combined systolic (congestive) and diastolic (congestive) heart failure ([place]) Active Problems:   CHF (congestive heart failure) ([place])   Acute kidney injury superimposed on CKD ([place])    History of Present Illness     [name] is a 85 y.o. male with past medical history of persistent atrial fibrillation (on Xarelto for anticoagulation), CAD (s/p CABG in 1982 with redo in 1994, cath in 2015 showing occluded left main and RCA, patent LIMA to LAD, patent SVG to OM, patent SVG to RCA, stable anatomy by repeat cath in 01/2018), ischemic cardiomyopathy, out-of-hospital cardiac arrest in 2011 with successful resuscitation and subsequent placement of Medtronic ICD, aortic stenosis (s/p TAVR 2015), HTN, HLD, Stage 3 CKD and OSA who presented to [facility] ED on 09/02/2018 for evaluation of worsening dyspnea on exertion. He denied any associated chest pain or palpitations.   EKG showed AV pacing but initial troponin was elevated to 5.65 (was not cycled at time of admissison). BNP elevated to 2559. Initial creatinine was 2.28 (baseline of 1.6 - 1.8). He was admitted for further management.   Hospital Course     Consultants: Nephrology  He initially required BiPAP with transition to HFNC the first day of admission. Was initially started on IV Lasix 40mg  BID but required higher dosing of 80mg  BID which was followed closely by Nephrology. He was transitioned to PO Lasix 80mg  BID on 09/05/2018 (listed as being on 40mg  daily PTA).   In regards to his atrial fibrillation, there was concern he was having intermittent episodes of recurrent atrial fibrillation or flutter during admission but his pacer was  interrogated and confirmed NSR. Was continued on Xarelto for anticoagulation and Amiodarone 200mg  daily.   On 09/06/2018, he reported his breathing was back to baseline and denied any symptoms. While recorded net output during admission was minimal at -1.7L, his weight declined by 7 lbs which suggests I&O's were not accurate. Weight at the time of discharge was 145 lbs and creatinine had improved to 1.83. Was last examined by Dr. Bronson Ing and deemed stable for discharge. A Virtual Office Visit has been arranged within the next 2 weeks.   _____________  Discharge Vitals Blood pressure (!) 108/52, pulse 60, temperature 97.9 F (36.6 C), temperature source Axillary, resp. rate 17, height 5\' 7"  (1.702 m), weight 65.9 kg, SpO2 94 %.  Filed Weights   09/03/18 0540 09/04/18 0635 09/05/18 0500  Weight: 69.3 kg 68.1 kg 65.9 kg    Labs & Radiologic Studies     CBC No results for input(s): WBC, NEUTROABS, HGB, HCT, MCV, PLT in the last 72 hours. Basic Metabolic Panel Recent Labs    09/05/18 0421 09/06/18 0434  NA 140 139  K 3.7 3.0*  CL 104 102  CO2 24 24  GLUCOSE 115* 106*  BUN 40* 34*  CREATININE 2.09* 1.83*  CALCIUM 10.0 10.1  PHOS 3.3 3.4   Liver Function Tests Recent Labs    09/05/18 0421 09/06/18 0434  ALBUMIN 2.9* 3.0*   No results for input(s): LIPASE, AMYLASE in the last 72 hours. Cardiac Enzymes No results for input(s): CKTOTAL, CKMB, CKMBINDEX, TROPONINI in the last 72 hours. BNP Invalid input(s): POCBNP D-Dimer No results  for input(s): DDIMER in the last 72 hours. Hemoglobin A1C No results for input(s): HGBA1C in the last 72 hours. Fasting Lipid Panel No results for input(s): CHOL, HDL, LDLCALC, TRIG, CHOLHDL, LDLDIRECT in the last 72 hours. Thyroid Function Tests No results for input(s): TSH, T4TOTAL, T3FREE, THYROIDAB in the last 72 hours.  Invalid input(s): FREET3  Dg Chest Port 1 View  Result Date: 09/02/2018 CLINICAL DATA:  Shortness of breath and  cough EXAM: PORTABLE CHEST 1 VIEW COMPARISON:  08/09/2018 FINDINGS: Cardiomegaly. There has been prior CABG and transcatheter aortic valve replacement. Vascular pedicle widening with Kerley lines and probable small pleural effusions. Dual-chamber ICD/pacer leads from the left in stable position. IMPRESSION: CHF pattern. Electronically Signed   By: Monte Fantasia M.D.   On: 09/02/2018 10:24   Dg Chest Portable 1 View  Result Date: 08/09/2018 CLINICAL DATA:  Worsening shortness of breath over the past week. EXAM: PORTABLE CHEST 1 VIEW COMPARISON:  Chest x-ray dated August 01, 2018. FINDINGS: Unchanged left chest wall pacemaker. Stable cardiomegaly status post CABG and TAVR. New diffusely increased interstitial markings. New small bilateral pleural effusions and left greater than right bibasilar atelectasis. No pneumothorax. No acute osseous abnormality. IMPRESSION: 1. New mild interstitial pulmonary edema and small bilateral pleural effusions, consistent with congestive heart failure. Electronically Signed   By: Titus Dubin M.D.   On: 08/09/2018 04:46   US Renal Artery Duplex Complete  Result Date: 08/26/2018 CLINICAL DATA:  85 year old male with a history of chronic kidney disease EXAM: RENAL/URINARY TRACT ULTRASOUND RENAL DUPLEX DOPPLER ULTRASOUND COMPARISON:  CT 03/11/2014 FINDINGS: Right Kidney: Length: 10.8 cm x 4.6 cm x 5.4 cm, 140 cc. Renal cortical thinning. No hydronephrosis. Anechoic cystic structure with through transmission measures 5.3 cm, with no internal complexity or flow. Additional cystic structure on the lateral cortex measures 2.0 cm with through transmission and no internal complexity/flow. Left Kidney: Length: 12.5 cm x 6.0 cm x 5.3 cm, 210 cc. No hydronephrosis. Cortical thinning, though not as severe as the right. Anechoic cystic structure measures 7.4 cm with through transmission no internal complexity or flow. Additional anechoic cystic structure measures 1.7 cm with no internal  color or flow. Bladder:  Bilateral ureteral jets identified.  No postvoid residual. RENAL DUPLEX ULTRASOUND Right Renal Artery Velocities: Origin: Not visualized Mid: Not visualized Hilum:  40 cm/sec Interlobar:  29 cm/sec Arcuate:  28 cm/sec Parvus tardus waveform of the arcuate and inter lobar arteries. Left Renal Artery Velocities: Origin: Not visualized Mid: Not visualized Hilum:  69 cm/sec Interlobar:  51 cm/sec Arcuate:  22 cm/sec Aortic Velocity:  97 cm/sec Right Renal-Aortic Ratios: Origin: NA Mid:  NA Hilum: 0.4 Interlobar: 0.3 Arcuate: 0.2 Left Renal-Aortic Ratios: Origin: NA Mid: NA Hilum: 0.7 Interlobar: 0.5 Arcuate: 0.2 IMPRESSION: Cortical thinning of the right greater than left kidneys. Directed duplex is limited by nonvisualization of the origin and mid segment of the left or right renal artery. There is a parvus tardus waveform of the right arcuate and right inter lobar arteries at the renal hilum, suggesting postobstructive waveform in the setting of a proximal right renal artery stenosis. If there is concern for further imaging evaluation, either evaluation with formal angiogram or CT angiogram may be useful. Bilateral Bosniak 1 cysts. Negative for hydronephrosis. Signed, Dulcy Fanny. Dellia Nims, RPVI [facility] Radiology Electronically Signed   By: Corrie Mckusick D.O.   On: 08/26/2018 12:27     Diagnostic Studies/Procedures    None Performed.  Disposition   Pt is being discharged home today in good condition.  Follow-up Plans & Appointments    Follow-up Information    Daune Perch, NP Follow up on 09/15/2018.   Specialty:  Cardiology Why:  Virtual Office Visit on 09/15/2018 at 3:15. Will be with Pecolia Ades, NP (works with Dr. Angelena Form). Contact information: [address] [place] [address] [phone]        Jamal Maes, MD. Schedule an appointment as soon as possible for a visit in 1 week(s).     Specialty:  Nephrology Contact information: [place] [address] [phone]          Discharge Instructions    Diet - low sodium heart healthy   Complete by:  As directed    Increase activity slowly   Complete by:  As directed       Discharge Medications     Medication List    STOP taking these medications   Potassium 99 MG Tabs     TAKE these medications   acetaminophen 500 MG tablet Commonly known as:  TYLENOL Take 500 mg by mouth every 6 (six) hours as needed for moderate pain or headache.   amiodarone 200 MG tablet Commonly known as:  PACERONE Take 1 tablet (200 mg total) by mouth daily. What changed:  when to take this   amLODipine 5 MG tablet Commonly known as:  NORVASC TAKE 1 TABLET BY MOUTH DAILY.   b complex vitamins capsule Take 1 capsule by mouth daily.   carvedilol 6.25 MG tablet Commonly known as:  COREG Take 1 tablet (6.25 mg total) by mouth 2 (two) times daily with a meal for 30 days.   CoQ10 100 MG Caps Take 100 mg by mouth at bedtime.   diazepam 5 MG tablet Commonly known as:  VALIUM TAKE 1 TABLET BY MOUTH ONCE DAILY AS NEEDED What changed:    when to take this  reasons to take this   ferrous sulfate 325 (65 FE) MG tablet Take 1 tablet (325 mg total) by mouth daily with breakfast.   furosemide 80 MG tablet Commonly known as:  LASIX Take 1 tablet (80 mg total) by mouth 2 (two) times daily. What changed:    medication strength  how much to take  when to take this   Krill Oil 500 MG Caps Take 500 mg by mouth at bedtime.   Magnesium 400 MG Caps Take 400 mg by mouth at bedtime.   Melatonin 1 MG Tabs Take 3 mg by mouth at bedtime as needed (sleep).   multivitamin with minerals Tabs tablet Take 1 tablet by mouth daily.   nitroGLYCERIN 0.4 MG SL tablet Commonly known as:  NITROSTAT Place 1 tablet (0.4 mg total) under the tongue every 5 (five) minutes as needed for chest pain (for chest pain (MAX of 3  doses)).   potassium chloride SA 20 MEQ tablet Commonly known as:  K-DUR Take 1 tablet (20 mEq total) by mouth daily.   rosuvastatin 40 MG tablet Commonly known as:  CRESTOR TAKE 1 TABLET BY MOUTH DAILY. What changed:  when to take this   senna 8.6 MG Tabs tablet Commonly known as:  SENOKOT Take 1 tablet by mouth daily as needed for mild constipation.   Systane Complete 0.6 % Soln Generic drug:  Propylene Glycol Place 1 drop into both eyes daily as needed (irritation).   trospium 20 MG tablet Commonly known as:  SANCTURA Take 20 mg by  mouth 2 (two) times daily.   TUMS PO Take 2 tablets by mouth 2 (two) times daily as needed (indigestion/ acid reflux).   Xarelto 15 MG Tabs tablet Generic drug:  Rivaroxaban TAKE 1 TABLET (15 MG TOTAL) BY MOUTH DAILY WITH SUPPER. What changed:  See the new instructions.         Allergies Allergies  Allergen Reactions   Cyclobenzaprine Hcl Rash   Tetracycline Rash          Acute coronary syndrome (MI, NSTEMI, STEMI, etc) this admission?:  No.  The elevated Troponin was due to the acute medical illness or demand ischemia.    Outstanding Labs/Studies   Will need BMET at time of follow-up visit if not obtained by Nephrology in the interim.   Duration of Discharge Encounter   Greater than 30 minutes including physician time.  Signed, Erma Heritage, PA-C 09/06/2018, 10:27 AM

## 2018-09-06 NOTE — Progress Notes (Signed)
Progress Note  Patient Name: [name] Date of Encounter: 09/06/2018  Primary Cardiologist: Lauree Chandler, MD   Subjective   Denies chest pain and shortness of breath. Eager to go home.  Inpatient Medications    Scheduled Meds: . amiodarone  200 mg Oral Daily  . amLODipine  5 mg Oral Daily  . carvedilol  6.25 mg Oral BID WC  . docusate sodium  200 mg Oral Daily  . furosemide  80 mg Oral BID  . pneumococcal 23 valent vaccine  0.5 mL Intramuscular Tomorrow-1000  . Rivaroxaban  15 mg Oral Q supper  . rosuvastatin  40 mg Oral Daily  . sodium chloride flush  3 mL Intravenous Q12H   Continuous Infusions: . sodium chloride    . ferumoxytol 510 mg (09/03/18 2019)   PRN Meds: sodium chloride, acetaminophen, ondansetron (ZOFRAN) IV, sodium chloride flush   Vital Signs    Vitals:   09/05/18 1941 09/05/18 2348 09/06/18 0549 09/06/18 0813  BP: 109/67 (!) 112/54 (!) 108/52   Pulse: 97 72 60 60  Resp: 20 17 20 17   Temp: 98 F (36.7 C)  97.9 F (36.6 C)   TempSrc: Oral  Axillary   SpO2: 91% 96% 94%   Weight:      Height:        Intake/Output Summary (Last 24 hours) at 09/06/2018 0852 Last data filed at 09/06/2018 0205 Gross per 24 hour  Intake -  Output 425 ml  Net -425 ml   Filed Weights   09/03/18 0540 09/04/18 0635 09/05/18 0500  Weight: 69.3 kg 68.1 kg 65.9 kg    Telemetry    Paced - Personally Reviewed   Physical Exam   GEN: No acute distress.   Neck: No JVD Cardiac: RRR, soft systolic murmur over RUSB, no rubs, or gallops.  Respiratory: Clear to auscultation bilaterally. GI: Soft, nontender, non-distended  MS: No edema; No deformity. Neuro:  Nonfocal  Psych: Normal affect   Labs    Chemistry Recent Labs  Lab 09/02/18 1046  09/04/18 0348 09/05/18 0421 09/06/18 0434  NA 139   < > 137 140 139  K 4.6   < > 3.4* 3.7 3.0*  CL 104   < > 104 104 102  CO2 21*   < > 24 24 24   GLUCOSE 159*   < > 101* 115* 106*  BUN 36*   < > 39* 40* 34*   CREATININE 2.28*   < > 2.06* 2.09* 1.83*  CALCIUM 10.8*   < > 9.8 10.0 10.1  PROT 6.5  --   --   --   --   ALBUMIN 3.8   < > 3.1* 2.9* 3.0*  AST 51*  --   --   --   --   ALT 37  --   --   --   --   ALKPHOS 58  --   --   --   --   BILITOT 0.8  --   --   --   --   GFRNONAA 26*   < > 29* 29* 34*  GFRAA 30*   < > 34* 33* 39*  ANIONGAP 14   < > 9 12 13    < > = values in this interval not displayed.     Hematology Recent Labs  Lab 09/02/18 1046 09/02/18 2048 09/03/18 0606  WBC 9.2 8.1 11.5*  RBC 4.47 4.21* 4.50  HGB 10.2* 9.9* 10.4*  HCT 34.6* 32.1* 34.6*  MCV 77.4* 76.2* 76.9*  MCH 22.8* 23.5* 23.1*  MCHC 29.5* 30.8 30.1  RDW 29.8* 29.8* 30.1*  PLT 145* 128* 139*    Cardiac Enzymes Recent Labs  Lab 09/02/18 1046  TROPONINI 5.65*   No results for input(s): TROPIPOC in the last 168 hours.   BNP Recent Labs  Lab 09/02/18 1004  BNP 2,559.0*     DDimer No results for input(s): DDIMER in the last 168 hours.   Radiology    No results found.   Patient Profile     85 y.o. male with history of persistent atrial fibrillationon Xarelto, CAD s/p CABG (1982 with redo 1994), ischemic cardiomyopathy, out-of-hospital cardiac arrest in 2011 with successful resuscitation and subsequent placement of Medtronic ICDfollowed by Dr. Caryl Comes, aortic stenosis s/p [id number], HTN, HLD and OSA.  Assessment & Plan    1. Acute on chronic diastolic CHF exacerbation: presented with worsening dyspnea that appears to be ongoing issue. -Echocardiogram 5/2/2020withinitial interpretation was EF 30 to 35%, however this was corrected later by Dr. Harrington Challenger who said EF is around 50 to 55% with apical hypokinesis however the study had very poor acoustic window, severe LAE, no significant valvular issue.  -CXR and BNP with significant fluid volume overload. CXR with pleural effusions and BNP greater than 2000 -currently on oral Lasix with improving renal function - I/O negative 425 cc yesterday.    2.CAD s/p CABG without angina:CABG in 1982 and redo CABG in 1994. Last cath10/2019 with stable CAD/3 patent bypass grafts. No chest pain.  -Troponin elevated5.65 however looking back at prior admission history, the patient had significant troponin elevation 01/2018 in which a cardiac catheterization was performed which showed no obvious culprit to explain non-STEMI and suggestions demand ischemia in the setting of A. fib with RVR with LAD lesions leading to LAD ischemia would explain -Continue statin and beta blocker -NoASAsecondary toXarelto  3.Atrial fibrillation, persistent: -Maintaining NSR- confirmed by pacer check on 5/28 -Onamiodarone200mg  QDand metoprolol -Continue Xarelto 20 -He has been followed closely in the EP clinic by Dr. Caryl Comes  4.Severe Aortic valve stenosis: -s/p TAVR December 2015>>followed by Dr. Angelena Form  -Normal functioning valve per echocardiogram 08/09/2018 -On XareltoAF  -SBE prophylaxis prior to indicated procedures  5. HLD: -Lipids controlled. Continue statin  6. Obstructive sleep apnea: -He is using CPAP now.needs to have higher flow oxygen for device at home. Will need [place] agency to change at [place].    7. AKI/CKD stage III: followed by Dr. Lorrene Reid with recent renal artery duplex noting possible RAS of right kidney. Cr 2.28>>2.35>>2.39>> 2.0>> 2.09>>1.83. Appreciate Dr Elissa Hefty assistance. Will need urology follow up due to problems with urinary retention. Agree with Flomax as per nephrology recs. May need to dc amlodipine to avoid hypotension.  Dispo: DC to home today.   For questions or updates, please contact [place] Please consult [email or web address] for contact info under Cardiology/STEMI.      Signed, Kate Sable, MD  09/06/2018, 8:52 AM

## 2018-09-06 NOTE — Progress Notes (Signed)
Patient ID: [name], male   DOB: 1933-07-25, 85 y.o.   MRN: [id number] S: Feels better O:BP (!) 108/52 (BP Location: Left Arm)   Pulse 60   Temp 97.9 F (36.6 C) (Axillary)   Resp 17   Ht 5\' 7"  (1.702 m)   Wt 65.9 kg   SpO2 94%   BMI 22.76 kg/m   Intake/Output Summary (Last 24 hours) at 09/06/2018 1032 Last data filed at 09/06/2018 0205 Gross per 24 hour  Intake -  Output 425 ml  Net -425 ml   Intake/Output: I/O last 3 completed shifts: In: 240 [P.O.:240] Out: 950 [Urine:875]  Intake/Output this shift:  No intake/output data recorded. Weight change:  Gen: NAD  CVS: no rub Resp: decreased BS at bases Abd: benign Ext: trace ankle edema  Recent Labs  Lab 09/02/18 1031 09/02/18 1046 09/02/18 2048 09/03/18 0606 09/04/18 0348 09/05/18 0421 09/06/18 0434  NA 137 139  --  136 137 140 139  K 4.6 4.6  --  4.2 3.4* 3.7 3.0*  CL  --  104  --  102 104 104 102  CO2  --  21*  --  22 24 24 24   GLUCOSE  --  159*  --  166* 101* 115* 106*  BUN  --  36*  --  41* 39* 40* 34*  CREATININE  --  2.28* 2.35* 2.39* 2.06* 2.09* 1.83*  ALBUMIN  --  3.8  --  3.7 3.1* 2.9* 3.0*  CALCIUM  --  10.8*  --  10.4* 9.8 10.0 10.1  PHOS  --   --   --  3.8 3.8 3.3 3.4  AST  --  51*  --   --   --   --   --   ALT  --  37  --   --   --   --   --    Liver Function Tests: Recent Labs  Lab 09/02/18 1046  09/04/18 0348 09/05/18 0421 09/06/18 0434  AST 51*  --   --   --   --   ALT 37  --   --   --   --   ALKPHOS 58  --   --   --   --   BILITOT 0.8  --   --   --   --   PROT 6.5  --   --   --   --   ALBUMIN 3.8   < > 3.1* 2.9* 3.0*   < > = values in this interval not displayed.   No results for input(s): LIPASE, AMYLASE in the last 168 hours. No results for input(s): AMMONIA in the last 168 hours. CBC: Recent Labs  Lab 09/02/18 1046 09/02/18 2048 09/03/18 0606  WBC 9.2 8.1 11.5*  NEUTROABS 7.7 6.0  --   HGB 10.2* 9.9* 10.4*  HCT 34.6* 32.1* 34.6*  MCV 77.4* 76.2* 76.9*  PLT 145*  128* 139*   Cardiac Enzymes: Recent Labs  Lab 09/02/18 1046  TROPONINI 5.65*   CBG: No results for input(s): GLUCAP in the last 168 hours.  Iron Studies: No results for input(s): IRON, TIBC, TRANSFERRIN, FERRITIN in the last 72 hours. Studies/Results: No results found. Marland Kitchen amiodarone  200 mg Oral Daily  . amLODipine  5 mg Oral Daily  . carvedilol  6.25 mg Oral BID WC  . docusate sodium  200 mg Oral Daily  . furosemide  80 mg Oral BID  . pneumococcal 23 valent vaccine  0.5  mL Intramuscular Tomorrow-1000  . potassium chloride  40 mEq Oral Once  . Rivaroxaban  15 mg Oral Q supper  . rosuvastatin  40 mg Oral Daily  . sodium chloride flush  3 mL Intravenous Q12H    BMET    Component Value Date/Time   NA 139 09/06/2018 0434   NA 141 02/18/2018 1611   K 3.0 (L) 09/06/2018 0434   CL 102 09/06/2018 0434   CO2 24 09/06/2018 0434   GLUCOSE 106 (H) 09/06/2018 0434   BUN 34 (H) 09/06/2018 0434   BUN 27 02/18/2018 1611   CREATININE 1.83 (H) 09/06/2018 0434   CREATININE 0.93 03/14/2016 1032   CALCIUM 10.1 09/06/2018 0434   GFRNONAA 34 (L) 09/06/2018 0434   GFRAA 39 (L) 09/06/2018 0434   CBC    Component Value Date/Time   WBC 11.5 (H) 09/03/2018 0606   RBC 4.50 09/03/2018 0606   HGB 10.4 (L) 09/03/2018 0606   HGB 9.8 (L) 02/18/2018 1611   HCT 34.6 (L) 09/03/2018 0606   HCT 30.6 (L) 02/18/2018 1611   PLT 139 (L) 09/03/2018 0606   PLT 175 02/18/2018 1611   MCV 76.9 (L) 09/03/2018 0606   MCV 83 02/18/2018 1611   MCH 23.1 (L) 09/03/2018 0606   MCHC 30.1 09/03/2018 0606   RDW 30.1 (H) 09/03/2018 0606   RDW 13.5 02/18/2018 1611   LYMPHSABS 1.1 09/02/2018 2048   LYMPHSABS 1.6 06/25/2017 1542   MONOABS 0.9 09/02/2018 2048   EOSABS 0.0 09/02/2018 2048   EOSABS 0.1 06/25/2017 1542   BASOSABS 0.0 09/02/2018 2048   BASOSABS 0.0 06/25/2017 1542     Assessment/Plan: 1. AKI/CKD stage 3 in setting of decompensated, combined systolic and diastolic CHF. His Cr has been rising  since his hospitalization in October 2019 for NSTEMI s/p cardiac cath and has had multiple episodes of AKI/CKD with his hospitalizations for acute CHF exacerbations. He was evaluated by Dr. Lorrene Reid who ordered a renal artery duplex with possible RAS of the right kidney. He has not been hypertensive and was hypotensive in the ED.  1. Had urinary retention by bladder scans/p foley cath placement with good uop 2. Slight improvement in BUN/Cr again overnight 3. Continue with po lasix 80mg  bid 4. Follow up with Dr. Lorrene [facility] at [facility] next week as already scheduled 5. Stable for discharge from renal standpoint. 2. Acute on chronic combined systolic and diastolic CHF- agree with IV lasix and follow UOP. 1. Improved UOP and Cr with po lasix 80mg  bid 3. CAD s/p CABG and redo- cath 10/19 revealed stable CAD. Elevated troponin but no angina. 4. Urinary retention- now with foley catheter.  Will need urology follow up as an outpatient and consider starting flomax if ok with Cardiology. 5. Ischemic CMP s/p AICD 6. Permanent atrial fibrillation- s/p failed cardioversion on 08/21/18. On xarelto  7. Severe aortic stenosis s/p TAVR 12/15 with normal functioning valve by ECHO 08/09/18 8. HTN- BP is soft, hold meds and follow with diuresis 9. Anemia of CKD-lowiron stores 1. Replete with IV feraheme 10. Possible right renal artery stenosis- unable to check MRI due to AICD and would hold off on renal angiogram with AKI. Continue to follow BP's 11. Disposition- pt has been clinically declining over the past month. Consider palliative care consult to help set goals/limits of care. 12. Hypercalcemia-improved. Vit D levelsnormaland iPTHmildly elevated at 94. Avoid any vit D or calcium supplementation. SPEP negative UPEP unfortunately was not sent.  F/u with Dr. Lorrene Reid as an outpatient.  Donetta Potts, MD [employer] [phone]

## 2018-09-06 NOTE — Progress Notes (Signed)
   09/05/18 1940  What Happened  Was fall witnessed? No  Was patient injured? No  Patient found on floor  Found by Staff-comment (Rey B, RN; Cordella Register, RN)  Stated prior activity to/from bed, chair, or stretcher  Follow Up  MD notified [place],MD  Time MD notified [place] notified No- patient refusal  Additional tests No  Simple treatment  (none)  Progress note created (see row info) Yes  Adult Fall Risk Assessment  Risk Factor Category (scoring not indicated) Fall has occurred during this admission (document High fall risk)  Age 85  Fall History: Fall within 6 months prior to admission 5  Elimination; Bowel and/or Urine Incontinence 0  Elimination; Bowel and/or Urine Urgency/Frequency 0  Medications: includes PCA/Opiates, Anti-convulsants, Anti-hypertensives, Diuretics, Hypnotics, Laxatives, Sedatives, and Psychotropics 5  Patient Care Equipment 1  Mobility-Assistance 2  Mobility-Gait 2  Mobility-Sensory Deficit 0  Altered awareness of immediate physical environment 0  Impulsiveness 0  Lack of understanding of one's physical/cognitive limitations 0  Total Score 18  Patient Fall Risk Level High fall risk  Adult Fall Risk Interventions  Required Bundle Interventions *See Row Information* High fall risk - low, moderate, and high requirements implemented  Additional Interventions Use of appropriate toileting equipment (bedpan, BSC, etc.)  Screening for Fall Injury Risk (To be completed on HIGH fall risk patients) - Assessing Need for Low Bed  Risk For Fall Injury- Low Bed Criteria Previous fall this admission  Will Implement Low Bed and Floor Mats Yes  Vitals  ECG Heart Rate 96  Oxygen Therapy  SpO2 100 %  Pain Assessment  Pain Scale 0-10  Pain Score 0  Neurological  Neuro (WDL) WDL  vitals on flow sheet at 1933/1940

## 2018-09-06 NOTE — TOC Transition Note (Signed)
Transition of Care [facility]) - CM/SW Discharge Note   Patient Details  Name: [name] MRN: [id number] Date of Birth: [birth year]  [facility] of [facility]) CM/SW Contact:  Claudie Leach, RN Phone Number: 09/06/2018, 1:29 PM   Clinical Narrative:    Patient is discharging on 8lpm O2.  Patient has a CPAP through [employer].  Pt's wife will pick him up for transport home.    Oxygen order sent to [facility] with [employer] to fill today for d/c.  [employer] aware patient will need to equipment to bleed oxygen into CPAP.     Final next level of care: Home/Self Care Barriers to Discharge: No Barriers Identified   Patient Goals and CMS Choice   CMS Medicare.gov Compare Post Acute Care list provided to:: Patient Choice offered to / list presented to : Patient   Discharge Plan and Services                DME Arranged: Oxygen DME Agency: [employer] Date DME Agency Contacted: 09/06/18 Time DME Agency Contacted: 46 Representative spoke with at [place]: [place]              Readmission Risk Interventions Readmission Risk Prevention Plan 08/10/2018  Transportation Screening Complete  Home Care Screening Complete  Some recent data might be hidden

## 2018-09-07 LAB — CULTURE, BLOOD (ROUTINE X 2)
Culture: NO GROWTH
Culture: NO GROWTH
Special Requests: ADEQUATE
Special Requests: ADEQUATE

## 2018-09-08 ENCOUNTER — Telehealth: Payer: Self-pay | Admitting: *Deleted

## 2018-09-08 NOTE — Telephone Encounter (Signed)
Pt was on TCM report admitted 09/02/18 for Acute on chronic combined systolic (congestive) and diastolic (congestive) heart failure.  Pt presented to [facility] ED on 09/02/2018 for evaluation of worsening dyspnea on exertion. He denied any associated chest pain or palpitations. EKG showed AV pacing but initial troponin was elevated to 5.65 (was not cycled at time of admissison). BNP elevated to 2559. Initial creatinine was 2.28 (baseline of 1.6 - 1.8). Pt started on IV Lasix 40mg  BID but required higher dosing of 80mg  BID which was followed closely by Nephrology. He was transitioned to PO Lasix 80mg  BID on 09/05/2018. Pt D/c 09/06/18, and will follow-up w/ cardiology 09/15/18, also will see his nephrology in 1 week.Marland KitchenJohny Chess

## 2018-09-09 ENCOUNTER — Telehealth: Payer: Self-pay | Admitting: Internal Medicine

## 2018-09-09 NOTE — Telephone Encounter (Signed)
Pts wife states that she thinks he needs PT. Will talk to him about it and call back for a referral if he agrees.

## 2018-09-09 NOTE — Telephone Encounter (Signed)
Copied from [place] [phone]. Topic: General - Inquiry >> Sep 09, 2018  2:43 PM Mathis Bud wrote: Reason for CRM: Patient wife called stating she believes her husband needs home health care and would like to [facility].  Patient was released from hospital 5/30. Wife is very concerned.  Call [phone]

## 2018-09-09 NOTE — Telephone Encounter (Signed)
Needs nursing?  PT/OT?   What services she does she think he needs specifically.

## 2018-09-10 ENCOUNTER — Telehealth: Payer: Self-pay

## 2018-09-10 NOTE — Telephone Encounter (Signed)
YOUR CARDIOLOGY TEAM HAS ARRANGED FOR AN E-VISIT FOR YOUR APPOINTMENT - PLEASE REVIEW IMPORTANT INFORMATION BELOW SEVERAL DAYS PRIOR TO YOUR APPOINTMENT  Due to the recent COVID-19 pandemic, we are transitioning in-person office visits to tele-medicine visits in an effort to decrease unnecessary exposure to our patients, their families, and staff. These visits are billed to your insurance just like a normal visit is. We also encourage you to sign up for MyChart if you have not already done so. You will need a smartphone if possible. For patients that do not have this, we can still complete the visit using a regular telephone but do prefer a smartphone to enable video when possible. You may have a family member that lives with you that can help. If possible, we also ask that you have a blood pressure cuff and scale at home to measure your blood pressure, heart rate and weight prior to your scheduled appointment. Patients with clinical needs that need an in-person evaluation and testing will still be able to come to the office if absolutely necessary. If you have any questions, feel free to call our office.     YOUR PROVIDER WILL BE USING THE FOLLOWING PLATFORM TO COMPLETE YOUR VISIT: Doximity  . IF USING MYCHART - How to Download the MyChart App to Your SmartPhone   - If Apple, go to App Store and type in MyChart in the search bar and download the app. If Android, ask patient to go to Google Play Store and type in MyChart in the search bar and download the app. The app is free but as with any other app downloads, your phone may require you to verify saved payment information or Apple/Android password.  - You will need to then log into the app with your MyChart username and password, and select  as your healthcare provider to link the account.  - When it is time for your visit, go to the MyChart app, find appointments, and click Begin Video Visit. Be sure to Select Allow for your device to  access the Microphone and Camera for your visit. You will then be connected, and your provider will be with you shortly.  **If you have any issues connecting or need assistance, please contact MyChart service desk [phone] ([phone])**  **If using a computer, in order to ensure the best quality for your visit, you will need to use either of the following Internet Browsers: Google Chrome or Microsoft Edge**  . IF USING DOXIMITY or DOXY.ME - The staff will give you instructions on receiving your link to join the meeting the day of your visit.      2-3 DAYS BEFORE YOUR APPOINTMENT  You will receive a telephone call from one of our [facility] team members - your caller ID may say "Unknown caller." If this is a video visit, we will walk you through how to get the video launched on your phone. We will remind you check your blood pressure, heart rate and weight prior to your scheduled appointment. If you have an Apple Watch or Kardia, please upload any pertinent ECG strips the day before or morning of your appointment to MyChart. Our staff will also make sure you have reviewed the consent and agree to move forward with your scheduled tele-health visit.     THE DAY OF YOUR APPOINTMENT  Approximately 15 minutes prior to your scheduled appointment, you will receive a telephone call from one of [facility] team - your caller ID may say "Unknown caller."    Our staff will confirm medications, vital signs for the day and any symptoms you may be experiencing. Please have this information available prior to the time of visit start. It may also be helpful for you to have a pad of paper and pen handy for any instructions given during your visit. They will also walk you through joining the smartphone meeting if this is a video visit.    CONSENT FOR TELE-HEALTH VISIT - PLEASE REVIEW  I hereby voluntarily request, consent and authorize [facility] and its employed or contracted physicians, physician  assistants, nurse practitioners or other licensed health care professionals (the Practitioner), to provide me with telemedicine health care services (the "Services") as deemed necessary by the treating Practitioner. I acknowledge and consent to receive the Services by the Practitioner via telemedicine. I understand that the telemedicine visit will involve communicating with the Practitioner through live audiovisual communication technology and the disclosure of certain medical information by electronic transmission. I acknowledge that I have been given the opportunity to request an in-person assessment or other available alternative prior to the telemedicine visit and am voluntarily participating in the telemedicine visit.  I understand that I have the right to withhold or withdraw my consent to the use of telemedicine in the course of my care at any time, without affecting my right to future care or treatment, and that the Practitioner or I may terminate the telemedicine visit at any time. I understand that I have the right to inspect all information obtained and/or recorded in the course of the telemedicine visit and may receive copies of available information for a reasonable fee.  I understand that some of the potential risks of receiving the Services via telemedicine include:  Marland Kitchen Delay or interruption in medical evaluation due to technological equipment failure or disruption; . Information transmitted may not be sufficient (e.g. poor resolution of images) to allow for appropriate medical decision making by the Practitioner; and/or  . In rare instances, security protocols could fail, causing a breach of personal health information.  Furthermore, I acknowledge that it is my responsibility to provide information about my medical history, conditions and care that is complete and accurate to the best of my ability. I acknowledge that Practitioner's advice, recommendations, and/or decision may be based on  factors not within their control, such as incomplete or inaccurate data provided by me or distortions of diagnostic images or specimens that may result from electronic transmissions. I understand that the practice of medicine is not an exact science and that Practitioner makes no warranties or guarantees regarding treatment outcomes. I acknowledge that I will receive a copy of this consent concurrently upon execution via email to the email address I last provided but may also request a printed copy by calling the office of [place].    I understand that my insurance will be billed for this visit.   I have read or had this consent read to me. . I understand the contents of this consent, which adequately explains the benefits and risks of the Services being provided via telemedicine.  . I have been provided ample opportunity to ask questions regarding this consent and the Services and have had my questions answered to my satisfaction. . I give my informed consent for the services to be provided through the use of telemedicine in my medical care  By participating in this telemedicine visit I agree to the above. 3

## 2018-09-11 DIAGNOSIS — N183 Chronic kidney disease, stage 3 (moderate): Secondary | ICD-10-CM | POA: Diagnosis not present

## 2018-09-11 DIAGNOSIS — I701 Atherosclerosis of renal artery: Secondary | ICD-10-CM | POA: Diagnosis not present

## 2018-09-11 DIAGNOSIS — D509 Iron deficiency anemia, unspecified: Secondary | ICD-10-CM | POA: Diagnosis not present

## 2018-09-11 DIAGNOSIS — I5033 Acute on chronic diastolic (congestive) heart failure: Secondary | ICD-10-CM | POA: Diagnosis not present

## 2018-09-11 DIAGNOSIS — Z952 Presence of prosthetic heart valve: Secondary | ICD-10-CM | POA: Diagnosis not present

## 2018-09-11 DIAGNOSIS — I214 Non-ST elevation (NSTEMI) myocardial infarction: Secondary | ICD-10-CM | POA: Diagnosis not present

## 2018-09-11 DIAGNOSIS — N2581 Secondary hyperparathyroidism of renal origin: Secondary | ICD-10-CM | POA: Diagnosis not present

## 2018-09-11 DIAGNOSIS — D649 Anemia, unspecified: Secondary | ICD-10-CM | POA: Diagnosis not present

## 2018-09-11 DIAGNOSIS — I4891 Unspecified atrial fibrillation: Secondary | ICD-10-CM | POA: Diagnosis not present

## 2018-09-15 ENCOUNTER — Telehealth (INDEPENDENT_AMBULATORY_CARE_PROVIDER_SITE_OTHER): Payer: Medicare Other | Admitting: Cardiology

## 2018-09-15 ENCOUNTER — Other Ambulatory Visit: Payer: Self-pay

## 2018-09-15 VITALS — BP 121/53 | HR 63 | Ht 67.0 in | Wt 142.0 lb

## 2018-09-15 DIAGNOSIS — I5043 Acute on chronic combined systolic (congestive) and diastolic (congestive) heart failure: Secondary | ICD-10-CM | POA: Diagnosis not present

## 2018-09-15 DIAGNOSIS — I2510 Atherosclerotic heart disease of native coronary artery without angina pectoris: Secondary | ICD-10-CM | POA: Insufficient documentation

## 2018-09-15 DIAGNOSIS — I350 Nonrheumatic aortic (valve) stenosis: Secondary | ICD-10-CM

## 2018-09-15 DIAGNOSIS — E7800 Pure hypercholesterolemia, unspecified: Secondary | ICD-10-CM

## 2018-09-15 DIAGNOSIS — G4733 Obstructive sleep apnea (adult) (pediatric): Secondary | ICD-10-CM

## 2018-09-15 DIAGNOSIS — I480 Paroxysmal atrial fibrillation: Secondary | ICD-10-CM

## 2018-09-15 DIAGNOSIS — N189 Chronic kidney disease, unspecified: Secondary | ICD-10-CM

## 2018-09-15 DIAGNOSIS — N179 Acute kidney failure, unspecified: Secondary | ICD-10-CM

## 2018-09-15 NOTE — Progress Notes (Signed)
Virtual Visit via Video Note   This visit type was conducted due to national recommendations for restrictions regarding the COVID-19 Pandemic (e.g. social distancing) in an effort to limit this patient's exposure and mitigate transmission in our community.  Due to his co-morbid illnesses, this patient is at least at moderate risk for complications without adequate follow up.  This format is felt to be most appropriate for this patient at this time.  All issues noted in this document were discussed and addressed.  A limited physical exam was performed with this format.  Please refer to the patient's chart for his consent to telehealth for [facility].   Date:  09/15/2018   ID:  [name], DOB Jun 13, 1933, MRN [id number]  Patient Location: Home Provider Location: Home  PCP:  Binnie Rail, MD  Cardiologist:  Lauree Chandler, MD  Electrophysiologist:  Virl Axe, MD   Evaluation Performed:  Follow-Up Visit  Chief Complaint:  Hospital follow up CHF  History of Present Illness:    CHIEF WALKUP is a 85 y.o. male with past medical history of persistent atrial fibrillation (on Xarelto for anticoagulation), CAD (s/p CABG in 1982 with redo in 1994, cath in 2015 showing occluded left main and RCA, patent LIMA to LAD, patent SVG to OM, patent SVG to RCA, stable anatomy by repeat cath in 01/2018), ischemic cardiomyopathy, out-of-hospital cardiac arrest in 2011 with successful resuscitation and subsequent placement of [employer] ICD, aortic stenosis (s/p [id number]), HTN, HLD, Stage 3 CKD and OSA. He underwnet DCCV for atrial tachycardia on 08/21/18 with resulting rhythm as APVP. He was recently found to have possible right renal artery stenosis. He is being followed by nephrology, Dr. Lorrene Reid at [facility]. Pt could not have MRI for further eval due to presence of ICD.   He presented to [facility] ED on 09/02/2018 for evaluation of worsening dyspnea on exertion. BNP was 2559, troponin 5.65.   He  initially required BiPAP with transition to HFNC the first day of admission. Was initially started on IV Lasix 40mg  BID but required higher dosing of 80mg  BID which was followed closely by Nephrology. He was transitioned to PO Lasix 80mg  BID on 09/05/2018 (listed as being on 40mg  daily PTA). He continued in sinus rhythm on amiodarone. His wt decreased by 7 lbs, to 145 lbs. SCr was up to 2.28 but improved to 1.83. He was discharged on lasix 80 mg BID.   Today he is seen for hospital follow up via video visit. His wife and daughter are present and help provide information. He says that he has been sleeping a lot. His wife says that he saw Dr. Lorrene Reid last Thursday and who felt he still had some fluid in the lungs and feet. Approx 10 lbs more to go. He has lost about 5 lbs since that time, 142.0 lbs today. Labs were checked on that day. He was told to limit oral fluid intake to less than 36 oz per day. Lasix was continued at 80 mg bid.   He says his breathing is good. He is on 24/7 oxygen, 8L. His O2 sat is running ~98%, goes down to ~90 when takes it off for shower. He can lay flat without problem but likes to have his head up just a little to sleep. He has trace ankle edema, better since seen on Thursday by Dr. Lorrene Reid per his wife and daughter.  He denies chest pain/pressure.   He has f/u with Dr. Lorrene Reid on 6/24.  The  patient does not have symptoms concerning for COVID-19 infection (fever, chills, cough, or new shortness of breath).    Past Medical History:  Diagnosis Date  . Aortic stenosis, severe    s/p TAVR with Edwards Sapien 3 THV (size 26 mm, model # 9600TFX, serial # [id number])  . Arthritis   . Cardiac arrest ([place])   . Chronic systolic CHF (congestive heart failure) ([place]) 01/21/2018  . Coronary artery disease    a. s/p CABG in 1982 b. redo CABG in 1994 c. cath in 2015 showing occluded left main and RCA, patent LIMA to LAD, patent SVG to OM, patent SVG to RCA d. stable anatomy by repeat cath in  01/2018  . Essential hypertension   . Headache   . History of hiatal hernia   . Hyperlipidemia   . Kidney stones   . OSA (obstructive sleep apnea) 02/05/2018   Severe obstructive sleep apnea with an AHI of 36.8/h with no significant central sleep apnea.   Now on CPAP at  5 cm H2O  . Peripheral vascular disease ([place])   . S/P cardiac cath 01/22/18  01/23/2018   Past Surgical History:  Procedure Laterality Date  . ADENOIDECTOMY    . APPENDECTOMY    . CARDIAC CATHETERIZATION   11/16/2009   . CARDIAC DEFIBRILLATOR PLACEMENT    . CARDIOVERSION N/A 02/24/2018   Procedure: CARDIOVERSION;  Surgeon: Sueanne Margarita, MD;  Location: [facility] ENDOSCOPY;  Service: Cardiovascular;  Laterality: N/A;  . CARDIOVERSION N/A 08/21/2018   Procedure: CARDIOVERSION;  Surgeon: Skeet Latch, MD;  Location: [facility] ENDOSCOPY;  Service: Cardiovascular;  Laterality: N/A;  . cataract surgery      Bilateral cataract surgery  . CORONARY ARTERY BYPASS GRAFT     in 1982 and '84 with subsequent PTCI  . EP IMPLANTABLE DEVICE N/A 03/21/2016   Procedure: ICD Generator Changeout;  Surgeon: Deboraha Sprang, MD;  Location: [place] CV LAB;  Service: Cardiovascular;  Laterality: N/A;  . HERNIA REPAIR    . LEFT AND RIGHT HEART CATHETERIZATION WITH CORONARY/GRAFT ANGIOGRAM N/A 02/01/2014   Procedure: LEFT AND RIGHT HEART CATHETERIZATION WITH Beatrix Fetters;  Surgeon: Sinclair Grooms, MD;  Location: [facility] CATH LAB;  Service: Cardiovascular;  Laterality: N/A;  . left elbow tendon surgery    . LEFT HEART CATH AND CORS/GRAFTS ANGIOGRAPHY N/A 01/22/2018   Procedure: LEFT HEART CATH AND CORS/GRAFTS ANGIOGRAPHY;  Surgeon: Leonie Man, MD;  Location: [place] CV LAB;  Service: Cardiovascular;  Laterality: N/A;  . TEE WITHOUT CARDIOVERSION N/A 03/23/2014   Procedure: TRANSESOPHAGEAL ECHOCARDIOGRAM (TEE);  Surgeon: Burnell Blanks, MD;  Location: [place];  Service: Open Heart Surgery;  Laterality: N/A;  .  TONSILLECTOMY    . TRANSCATHETER AORTIC VALVE REPLACEMENT, TRANSFEMORAL N/A 03/23/2014   Procedure: TRANSCATHETER AORTIC VALVE REPLACEMENT, TRANSFEMORAL;  Surgeon: Burnell Blanks, MD;  Location: [place];  Service: Open Heart Surgery;  Laterality: N/A;     Current Meds  Medication Sig  . acetaminophen (TYLENOL) 500 MG tablet Take 500 mg by mouth every 6 (six) hours as needed for moderate pain or headache.  Marland Kitchen amiodarone (PACERONE) 200 MG tablet Take 1 tablet (200 mg total) by mouth daily.  Marland Kitchen amLODipine (NORVASC) 5 MG tablet TAKE 1 TABLET BY MOUTH DAILY. (Patient taking differently: Take 5 mg by mouth daily. )  . b complex vitamins capsule Take 1 capsule by mouth daily.  . carvedilol (COREG) 6.25 MG tablet Take 1 tablet (6.25 mg total) by mouth 2 (two)  times daily with a meal for 30 days.  . Coenzyme Q10 (COQ10) 100 MG CAPS Take 100 mg by mouth at bedtime.  . diazepam (VALIUM) 5 MG tablet TAKE 1 TABLET BY MOUTH ONCE DAILY AS NEEDED  . ferrous sulfate 325 (65 FE) MG tablet Take 1 tablet (325 mg total) by mouth daily with breakfast.  . furosemide (LASIX) 80 MG tablet Take 1 tablet (80 mg total) by mouth 2 (two) times daily.  Javier Docker Oil 500 MG CAPS Take 500 mg by mouth at bedtime.  . Magnesium 400 MG CAPS Take 400 mg by mouth at bedtime.  . Melatonin 1 MG TABS Take 3 mg by mouth at bedtime as needed (sleep).   . Multiple Vitamin (MULTIVITAMIN WITH MINERALS) TABS tablet Take 1 tablet by mouth daily.  . nitroGLYCERIN (NITROSTAT) 0.4 MG SL tablet Place 1 tablet (0.4 mg total) under the tongue every 5 (five) minutes as needed for chest pain (for chest pain (MAX of 3 doses)).  Marland Kitchen potassium chloride SA (K-DUR) 20 MEQ tablet Take 1 tablet (20 mEq total) by mouth daily.  Marland Kitchen Propylene Glycol (SYSTANE COMPLETE) 0.6 % SOLN Place 1 drop into both eyes daily as needed (irritation).  . rosuvastatin (CRESTOR) 40 MG tablet TAKE 1 TABLET BY MOUTH DAILY.  Marland Kitchen senna (SENOKOT) 8.6 MG TABS tablet Take 1 tablet by  mouth daily as needed for mild constipation.  . trospium (SANCTURA) 20 MG tablet Take 20 mg by mouth 2 (two) times daily.  Alveda Reasons 15 MG TABS tablet TAKE 1 TABLET (15 MG TOTAL) BY MOUTH DAILY WITH SUPPER. (Patient taking differently: Take 15 mg by mouth daily with supper. )     Allergies:   Cyclobenzaprine hcl and Tetracycline   Social History   Tobacco Use  . Smoking status: Former Smoker    Last attempt to quit: 10/25/1952    Years since quitting: 65.9  . Smokeless tobacco: Never Used  Substance Use Topics  . Alcohol use: Yes    Alcohol/week: 5.0 - 6.0 standard drinks    Types: 5 - 6 Glasses of wine per week    Comment: 5-6 times a week  . Drug use: No     Family Hx: The patient's family history includes Coronary artery disease in an other family member; Heart attack (age of onset: 21) in his father.  ROS:   Please see the history of present illness.     All other systems reviewed and are negative.   Prior CV studies:   The following studies were reviewed today:  Echocardiogram 08/09/18 IMPRESSIONS  1. The left ventricle has moderate-severely reduced systolic function, with an ejection fraction of 30-35%. The cavity size was mild to moderately dilated. Left ventricular diastolic Doppler parameters are indeterminate.  2. LVEF is approximately 50 to 55% with apical hypokinesis.  3. The right ventricle has normal systolic function. The cavity was normal. There is no increase in right ventricular wall thickness.  4. Left atrial size was severely dilated.  5. Trivial pericardial effusion is present.  6. The mitral valve is abnormal. Moderate thickening of the mitral valve leaflet. There is mild mitral annular calcification present.  7. The aortic valve is tricuspid. Mild thickening of the aortic valve. Aortic valve regurgitation is trivial by color flow Doppler.  8. The aortic root is normal in size and structure.  9. The inferior vena cava was dilated in size with <50%  respiratory variability. 10. The interatrial septum was not assessed.  FINDINGS  Left Ventricle: The left ventricle has moderate-severely reduced systolic function, with an ejection fraction of 30-35%. The cavity size was mild to moderately dilated. There is no increase in left ventricular wall thickness. Left ventricular diastolic  Doppler parameters are indeterminate. LVEF is approximately 50 to 55% with apical hypokinesis.  Renal ultrasound 08/26/2018 IMPRESSION: Cortical thinning of the right greater than left kidneys.  Directed duplex is limited by nonvisualization of the origin and mid segment of the left or right renal artery. There is a parvus tardus waveform of the right arcuate and right inter lobar arteries at the renal hilum, suggesting postobstructive waveform in the setting of a proximal right renal artery stenosis. If there is concern for further imaging evaluation, either evaluation with formal angiogram or CT angiogram may be useful.  Labs/Other Tests and Data Reviewed:    EKG:  An ECG dated 09/02/2018 was personally reviewed today and demonstrated:  AV pacing  Recent Labs: 01/20/2018: Magnesium 2.3 02/18/2018: TSH 2.530 09/02/2018: ALT 37; B Natriuretic Peptide 2,559.0 09/03/2018: Hemoglobin 10.4; Platelets 139 09/06/2018: BUN 34; Creatinine, Ser 1.83; Potassium 3.0; Sodium 139   Recent Lipid Panel Lab Results  Component Value Date/Time   CHOL 130 01/21/2018 04:47 AM   CHOL 125 03/21/2017 08:14 AM   TRIG 209 (H) 01/21/2018 04:47 AM   HDL 30 (L) 01/21/2018 04:47 AM   HDL 37 (L) 03/21/2017 08:14 AM   CHOLHDL 4.3 01/21/2018 04:47 AM   LDLCALC 58 01/21/2018 04:47 AM   LDLCALC 68 03/21/2017 08:14 AM    Wt Readings from Last 3 Encounters:  09/15/18 142 lb (64.4 kg)  09/05/18 145 lb 4.8 oz (65.9 kg)  08/15/18 151 lb 1.6 oz (68.5 kg)     Objective:    Vital Signs:  BP (!) 121/53   Pulse 63   Ht 5\' 7"  (1.702 m)   Wt 142 lb (64.4 kg)   SpO2 99%   BMI 22.24  kg/m    VITAL SIGNS:  reviewed GEN:  no acute distress RESPIRATORY:  normal respiratory effort, symmetric expansion NEURO:  alert and oriented x 3, no obvious focal deficit PSYCH:  normal affect  ASSESSMENT & PLAN:    1. Acute on chronic diastolic HF  -Echocardiogram 5/2/2020withinitial interpretation was EF 30 to 35%, however this was corrected later by Dr. Harrington Challenger who said EF is around 50 to 55% with apical hypokinesis however the study had very poor acoustic window, severe LAE, no significant valvular issue.  -Diuresed in hospital with improvement. Discharged home on lasix 80 mg BID (up from prior 40 mg daily). -limits sodium   -At appt with Dr. Lorrene Reid on Thursday, she thought he still had some extra fluid (about 10 lbs) and lasix 80 mg BID was continued. His wife and daughter says he is down about 5 lbs since then.  -Pt denies dyspnea or orthopnea. Has trace ankle edema, improved since Thursday.  -Pt still using oxygen at 8L. O2 sats mostly 98-99%. Advised that they can wean oxygen 2L at a time as long as O2 sat stays above 93%.  -making slow progress. Continue current therapy. He has f/u with nephrology on 6/24 and will leave the diuretic dosing to Dr. Lorrene Reid.  2. CAD s/p CAD without angina -No angina.   3. Atrial fibrillation, persistent -PPM interrogated in hospital showed sinus rhythm.  -Continues amiodarone for rhythm control and Xarelto for stroke risk reduction.  -Has EP follow up with Dr. Caryl Comes on 10/15/18.   4. Acute on chronic kidney disease -  Renal US showed possible right RAS. -Followed by [facility]. Lorrene [facility] at [facility]. Had acute kidney injury in hospital and urinary retention. He is treated with trospium per urology. He thinks he is emptying his bladder well.   5. Severe Aortic valve stenosis -s/p TAVR December 2015>>followed by Dr. Angelena Form  -Normal functioning valve per echocardiogram 08/09/2018 -On Xareltofor AF  -SBE prophylaxis prior to indicated procedures   6. Hyperlipidemia -Lipids controlled on statin  7. Obstructive sleep apnea -Uses CPAP with addition of supplemental oxygen.   COVID-19 Education: The signs and symptoms of COVID-19 were discussed with the patient and how to seek care for testing (follow up with PCP or arrange E-visit).  The importance of social distancing was discussed today.  Time:   Today, I have spent 22 minutes with the patient with telehealth technology discussing the above problems.     Medication Adjustments/Labs and Tests Ordered: Current medicines are reviewed at length with the patient today.  Concerns regarding medicines are outlined above.   Tests Ordered: No orders of the defined types were placed in this encounter.   Medication Changes: No orders of the defined types were placed in this encounter.   Disposition:  Follow up in 8 week(s)  Signed, Daune Perch, NP  09/15/2018 4:35 PM    [facility]

## 2018-09-15 NOTE — Patient Instructions (Addendum)
Medication Instructions:  Your physician recommends that you continue on your current medications as directed. Please refer to the Current Medication list given to you today.  If you need a refill on your cardiac medications before your next appointment, please call your pharmacy.   Lab work: None  If you have labs (blood work) drawn today and your tests are completely normal, you will receive your results only by: Marland Kitchen MyChart Message (if you have MyChart) OR . A paper copy in the mail If you have any lab test that is abnormal or we need to change your treatment, we will call you to review the results.  Testing/Procedures: None  Follow-Up: Follow up with Dr. Angelena Form in August   You are scheduled for a visit with Dr. Caryl Comes in 10/15/2018  Any Other Special Instructions Will Be Listed Below (If Applicable). None   Lifestyle Modifications to Prevent and Treat Heart Disease -Recommend heart healthy/Mediterranean diet, with whole grains, fruits, vegetable, fish, lean meats, nuts, and olive oil.  -Limit salt. -Recommend moderate walking, 3-5 times/week for 30-50 minutes each session. Aim for at least 150 minutes.week. Goal should be pace of 3 miles/hours, or walking 1.5 miles in 30 minutes -Recommend avoidance of tobacco products. Avoid excess alcohol. -Keep blood pressure well controlled, ideally less than 130/80.  ===============================================================  Do the following things EVERY DAY:   1. Weigh yourself EVERY morning after you go to the bathroom but before you eat or drink anything. Write this number down in a weight log/diary. If you gain 3 pounds overnight or 5 pounds in a week, call the office.   2. Take your medicines as prescribed. If you have concerns about your medications, please call us before you stop taking them.    3. Eat low salt foods-Limit salt (sodium) to 2000 mg per day. This will help prevent your body from holding onto fluid. Read food  labels as many processed foods have a lot of sodium, especially canned goods and prepackaged meats. If you would like some assistance choosing low sodium foods, we would be happy to set you up with a nutritionist.   4. Stay as active as you can everyday. Staying active will give you more energy and make your muscles stronger. Start with 5 minutes at a time and work your way up to 30 minutes a day. Break up your activities--do some in the morning and some in the afternoon. Start with 3 days per week and work your way up to 5 days as you can.  If you have chest pain, feel short of breath, dizzy, or lightheaded, STOP. If you don't feel better after a short rest, call 911. If you do feel better, call the office to let us know you have symptoms with exercise.   5. Limit all fluids for the day to less than 2 liters. Fluid includes all drinks, coffee, juice, ice chips, soup, jello, and all other liquids.

## 2018-09-18 DIAGNOSIS — D649 Anemia, unspecified: Secondary | ICD-10-CM | POA: Diagnosis not present

## 2018-09-18 DIAGNOSIS — D509 Iron deficiency anemia, unspecified: Secondary | ICD-10-CM | POA: Diagnosis not present

## 2018-09-18 DIAGNOSIS — I5043 Acute on chronic combined systolic (congestive) and diastolic (congestive) heart failure: Secondary | ICD-10-CM | POA: Diagnosis not present

## 2018-09-18 DIAGNOSIS — N183 Chronic kidney disease, stage 3 (moderate): Secondary | ICD-10-CM | POA: Diagnosis not present

## 2018-09-22 ENCOUNTER — Telehealth: Payer: Self-pay | Admitting: Internal Medicine

## 2018-09-22 MED ORDER — CARVEDILOL 6.25 MG PO TABS: 6.2500 mg | ORAL_TABLET | Freq: Two times a day (BID) | ORAL | 0 refills | Status: DC

## 2018-09-22 MED ORDER — CARVEDILOL 6.25 MG PO TABS: 6.2500 mg | ORAL_TABLET | Freq: Two times a day (BID) | ORAL | 0 refills | Status: AC

## 2018-09-22 NOTE — Telephone Encounter (Signed)
Ok to send - last prescribed by hospitalist and they will only prescribe 30 -- send for 90 days

## 2018-09-22 NOTE — Telephone Encounter (Signed)
I have not sent this medication to pharmacy yet. It was on print. Are you ok filling medication? You were not the last prescriber and it stated to take it 30 days.

## 2018-09-22 NOTE — Telephone Encounter (Signed)
Copied from [place] [phone]. Topic: Quick Communication - Rx Refill/Question >> Sep 22, 2018 10:58 AM Celene Kras A wrote: Medication: carvedilol (COREG) 6.25 MG tablet  Has the patient contacted their pharmacy? No. Pts wife called stating pt has none of this medication today. Pts wife states they were not able to get in contact with hospital doctor. Please advise.  (Agent: If no, request that the patient contact the pharmacy for the refill.) (Agent: If yes, when and what did the pharmacy advise?)  Preferred Pharmacy (with phone number or street name): [facility], [address] [place] [address] Phone: [phone] Fax: [phone] Not a 24 hour pharmacy; exact hours not known.    Agent: Please be advised that RX refills may take up to 3 business days. We ask that you follow-up with your pharmacy.

## 2018-09-22 NOTE — Telephone Encounter (Signed)
Rx faxed

## 2018-09-27 NOTE — Progress Notes (Deleted)
Virtual Visit via Video Note  I connected with [name] on 09/27/18 at 10:30 AM EDT by a video enabled telemedicine application and verified that I am speaking with the correct person using two identifiers.   I discussed the limitations of evaluation and management by telemedicine and the availability of in person appointments. The patient expressed understanding and agreed to proceed.  The patient is currently at home and I am in the office.    No referring provider.    History of Present Illness: He is here for follow up of his chronic medical conditions.     Chronic diastolic CHF, AFib, Hypertension: He is taking his medication daily. He is compliant with a low sodium diet.  He denies chest pain, palpitations, edema, shortness of breath and regular headaches.  He does not monitor his blood pressure at home.    Hyperlipidemia: He is taking his medication daily. He is compliant with a low fat/cholesterol diet. He denies myalgias.   CKD:  Anemia:  OSA:  He uses cpap intermittently     ROS   Social History   Socioeconomic History  . Marital status: Married    Spouse name: Not on file  . Number of children: 3  . Years of education: Not on file  . Highest education level: Not on file  Occupational History  . Occupation: Retired  Scientific laboratory technician  . Financial resource strain: Not hard at all  . Food insecurity    Worry: Never true    Inability: Never true  . Transportation needs    Medical: No    Non-medical: No  Tobacco Use  . Smoking status: Former Smoker    Quit date: 10/25/1952    Years since quitting: 65.9  . Smokeless tobacco: Never Used  Substance and Sexual Activity  . Alcohol use: Yes    Alcohol/week: 5.0 - 6.0 standard drinks    Types: 5 - 6 Glasses of wine per week    Comment: 5-6 times a week  . Drug use: No  . Sexual activity: Never  Lifestyle  . Physical activity    Days per week: Not on file    Minutes per session: Not on file  . Stress:  Only a little  Relationships  . Social connections    Talks on phone: More than three times a week    Gets together: More than three times a week    Attends religious service: More than 4 times per year    Active member of club or organization: Not on file    Attends meetings of clubs or organizations: More than 4 times per year    Relationship status: Married  Other Topics Concern  . Not on file  Social History Narrative  . Not on file     Observations/Objective: Appears well in NAD   Assessment and Plan:  See Problem List for Assessment and Plan of chronic medical problems.   Follow Up Instructions:    I discussed the assessment and treatment plan with the patient. The patient was provided an opportunity to ask questions and all were answered. The patient agreed with the plan and demonstrated an understanding of the instructions.   The patient was advised to call back or seek an in-person evaluation if the symptoms worsen or if the condition fails to improve as anticipated.    Binnie Rail, MD

## 2018-09-29 ENCOUNTER — Ambulatory Visit (INDEPENDENT_AMBULATORY_CARE_PROVIDER_SITE_OTHER): Payer: Medicare Other | Admitting: Internal Medicine

## 2018-09-29 ENCOUNTER — Encounter: Payer: Self-pay | Admitting: Internal Medicine

## 2018-09-29 VITALS — BP 130/78 | HR 79 | Temp 98.3°F | Resp 18 | Ht 67.0 in | Wt 142.8 lb

## 2018-09-29 DIAGNOSIS — I5022 Chronic systolic (congestive) heart failure: Secondary | ICD-10-CM

## 2018-09-29 DIAGNOSIS — G2581 Restless legs syndrome: Secondary | ICD-10-CM | POA: Diagnosis not present

## 2018-09-29 DIAGNOSIS — N183 Chronic kidney disease, stage 3 unspecified: Secondary | ICD-10-CM

## 2018-09-29 DIAGNOSIS — I255 Ischemic cardiomyopathy: Secondary | ICD-10-CM

## 2018-09-29 DIAGNOSIS — E7800 Pure hypercholesterolemia, unspecified: Secondary | ICD-10-CM

## 2018-09-29 DIAGNOSIS — I480 Paroxysmal atrial fibrillation: Secondary | ICD-10-CM

## 2018-09-29 DIAGNOSIS — D649 Anemia, unspecified: Secondary | ICD-10-CM

## 2018-09-29 DIAGNOSIS — I10 Essential (primary) hypertension: Secondary | ICD-10-CM

## 2018-09-29 DIAGNOSIS — J9611 Chronic respiratory failure with hypoxia: Secondary | ICD-10-CM | POA: Diagnosis not present

## 2018-09-29 DIAGNOSIS — G4733 Obstructive sleep apnea (adult) (pediatric): Secondary | ICD-10-CM

## 2018-09-29 NOTE — Patient Instructions (Addendum)
   Medications reviewed and updated.  Changes include :   none    Please followup in 9 months

## 2018-09-29 NOTE — Assessment & Plan Note (Signed)
Taking iron pill daily Will likely have repeat blood work done in 2 days for recheck  Monitored by nephrology

## 2018-09-29 NOTE — Assessment & Plan Note (Signed)
Currently on oxygen continuously has been using 4 L/min via nasal cannula Oxygen saturation has been in the upper 90s-97% - 99% While in the office we decreased to 3 L/min and he did fine Advised him to decrease to 2 L/min at home and monitor his oxygen-can increase of oxygen drops

## 2018-09-29 NOTE — Assessment & Plan Note (Signed)
Appears to be euvolemic here today Taking Lasix twice daily Has a follow-up appointment with Dr. Lorrene Reid in 2 days Weight has been stable at home We will not get blood work today since he will be getting blood work in a couple of days to see if his medications can be adjusted

## 2018-09-29 NOTE — Assessment & Plan Note (Signed)
Has follow-up with Dr. Lorrene Reid in 2 days Since she will be getting blood work done I will not have any blood work done today Currently taking Lasix 80 mg twice daily-we will let her adjust He appears to be euvolemic here today

## 2018-09-29 NOTE — Assessment & Plan Note (Signed)
Compliant with CPAP-using nightly Stressed the importance of using this on a nightly basis

## 2018-09-29 NOTE — Progress Notes (Signed)
Subjective:    Patient ID: [name], male    DOB: May 05, 1933, 85 y.o.   MRN: [id number]  HPI He is here for follow up of his chronic medical conditions.  His wife is here with him.    Chronic diastolic CHF, AFib, Hypertension: He is taking his medication daily. He is compliant with a low sodium diet.  He denies chest pain, palpitations, edema, shortness of breath and regular headaches.  He is using the oxygen continuously.  He is currently on 4 L/min via nasal cannula.  His oxygen level at home has been in the upper 90s.  He wonders about how much oxygen he needs and if he will ever be able to get off of the oxygen.  Hyperlipidemia: He is taking his medication daily. He is compliant with a low fat/cholesterol diet. He denies myalgias.   CKD, anemia: He has a follow-up appointment with Dr. Lorrene Reid in 2 days.  He is taking the Lasix twice daily as prescribed and his iron pills daily.  His weights have been stable in the upper 130s over the past several days.  He denies any shortness of breath.  He feels his energy level is very good.  OSA:  He uses cpap nightly.  He has been more compliant with using his CPAP.    Medications and allergies reviewed with patient and updated if appropriate.  Patient Active Problem List   Diagnosis Date Noted  . CAD (coronary artery disease) 09/15/2018  . S/P TAVR (transcatheter aortic valve replacement)   . Demand ischemia ([place])   . Respiratory failure with hypoxia ([place]) 08/18/2018  . Cardiac arrest ([place])   . Acute on chronic combined systolic (congestive) and diastolic (congestive) heart failure ([place]) 08/11/2018  . Essential hypertension 08/09/2018  . CKD (chronic kidney disease) stage 3, GFR 30-59 ml/min (HCC) 08/09/2018  . Constipation 03/03/2018  . Anemia 03/03/2018  . OSA (obstructive sleep apnea) 02/05/2018  . S/P cardiac cath 01/22/18 stable -treating medically  01/23/2018  . Atrial tachycardia ([place]) 01/22/2018  . Non-ST elevation  (NSTEMI) myocardial infarction ([place]) 01/21/2018  . Chronic systolic CHF (congestive heart failure) ([place]) 01/21/2018  . Sleep difficulties 07/29/2015  . Severe aortic valve stenosis - s/p  Edwards Sapien 3 THV (size 26 mm, model # 9600TFX, serial # [id number]) 03/23/2014 03/23/2014  . Ischemic cardiomyopathy 12/23/11  . Sudden cardiac death-aborted 10-16-10  . Back pain 07/10/2010  . Hypercholesteremia 07/10/2010  . Benign hypertensive heart disease without heart failure 07/10/2010  . S/P CABG (coronary artery bypass graft) 1982 and re-do 1994  07/10/2010  . Implantable cardioverter-defibrillator (ICD) in situ 03/30/2010  . Atrial fibrillation ([place]) 07-11-2009    Current Outpatient Medications on File Prior to Visit  Medication Sig Dispense Refill  . acetaminophen (TYLENOL) 500 MG tablet Take 500 mg by mouth every 6 (six) hours as needed for moderate pain or headache.    Marland Kitchen amiodarone (PACERONE) 200 MG tablet Take 1 tablet (200 mg total) by mouth daily. 30 tablet 6  . amLODipine (NORVASC) 5 MG tablet TAKE 1 TABLET BY MOUTH DAILY. (Patient taking differently: Take 5 mg by mouth daily. ) 90 tablet 3  . b complex vitamins capsule Take 1 capsule by mouth daily. 90 capsule 0  . carvedilol (COREG) 6.25 MG tablet Take 1 tablet (6.25 mg total) by mouth 2 (two) times daily with a meal for 30 days. 180 tablet 0  . Coenzyme Q10 (COQ10) 100 MG CAPS Take 100 mg by mouth at  bedtime.    . diazepam (VALIUM) 5 MG tablet TAKE 1 TABLET BY MOUTH ONCE DAILY AS NEEDED 30 tablet 2  . ferrous sulfate 325 (65 FE) MG tablet Take 1 tablet (325 mg total) by mouth daily with breakfast.  3  . furosemide (LASIX) 80 MG tablet Take 1 tablet (80 mg total) by mouth 2 (two) times daily. 180 tablet 3  . Krill Oil 500 MG CAPS Take 500 mg by mouth at bedtime.    . Magnesium 400 MG CAPS Take 400 mg by mouth at bedtime.    . Melatonin 1 MG TABS Take 3 mg by mouth at bedtime as needed (sleep).     . Multiple Vitamin (MULTIVITAMIN  WITH MINERALS) TABS tablet Take 1 tablet by mouth daily.    . nitroGLYCERIN (NITROSTAT) 0.4 MG SL tablet Place 1 tablet (0.4 mg total) under the tongue every 5 (five) minutes as needed for chest pain (for chest pain (MAX of 3 doses)). 25 tablet 6  . potassium chloride SA (K-DUR) 20 MEQ tablet Take 1 tablet (20 mEq total) by mouth daily. 30 tablet 5  . Propylene Glycol (SYSTANE COMPLETE) 0.6 % SOLN Place 1 drop into both eyes daily as needed (irritation).    . rosuvastatin (CRESTOR) 40 MG tablet TAKE 1 TABLET BY MOUTH DAILY. 90 tablet 3  . senna (SENOKOT) 8.6 MG TABS tablet Take 1 tablet by mouth daily as needed for mild constipation.    . trospium (SANCTURA) 20 MG tablet Take 20 mg by mouth 2 (two) times daily.    Alveda Reasons 15 MG TABS tablet TAKE 1 TABLET (15 MG TOTAL) BY MOUTH DAILY WITH SUPPER. (Patient taking differently: Take 15 mg by mouth daily with supper. ) 30 tablet 5   No current facility-administered medications on file prior to visit.     Past Medical History:  Diagnosis Date  . Aortic stenosis, severe    s/p TAVR with Edwards Sapien 3 THV (size 26 mm, model # 9600TFX, serial # [id number])  . Arthritis   . Cardiac arrest ([place])   . Chronic systolic CHF (congestive heart failure) ([place]) 01/21/2018  . Coronary artery disease    a. s/p CABG in 1982 b. redo CABG in 1994 c. cath in 2015 showing occluded left main and RCA, patent LIMA to LAD, patent SVG to OM, patent SVG to RCA d. stable anatomy by repeat cath in 01/2018  . Essential hypertension   . Headache   . History of hiatal hernia   . Hyperlipidemia   . Kidney stones   . OSA (obstructive sleep apnea) 02/05/2018   Severe obstructive sleep apnea with an AHI of 36.8/h with no significant central sleep apnea.   Now on CPAP at  5 cm H2O  . Peripheral vascular disease ([place])   . S/P cardiac cath 01/22/18  01/23/2018    Past Surgical History:  Procedure Laterality Date  . ADENOIDECTOMY    . APPENDECTOMY    . CARDIAC  CATHETERIZATION   11/16/2009   . CARDIAC DEFIBRILLATOR PLACEMENT    . CARDIOVERSION N/A 02/24/2018   Procedure: CARDIOVERSION;  Surgeon: Sueanne Margarita, MD;  Location: [facility] ENDOSCOPY;  Service: Cardiovascular;  Laterality: N/A;  . CARDIOVERSION N/A 08/21/2018   Procedure: CARDIOVERSION;  Surgeon: Skeet Latch, MD;  Location: [facility] ENDOSCOPY;  Service: Cardiovascular;  Laterality: N/A;  . cataract surgery      Bilateral cataract surgery  . CORONARY ARTERY BYPASS GRAFT     in 1982 and '84 with subsequent  PTCI  . EP IMPLANTABLE DEVICE N/A 03/21/2016   Procedure: ICD Generator Changeout;  Surgeon: Deboraha Sprang, MD;  Location: [place] CV LAB;  Service: Cardiovascular;  Laterality: N/A;  . HERNIA REPAIR    . LEFT AND RIGHT HEART CATHETERIZATION WITH CORONARY/GRAFT ANGIOGRAM N/A 02/01/2014   Procedure: LEFT AND RIGHT HEART CATHETERIZATION WITH Beatrix Fetters;  Surgeon: Sinclair Grooms, MD;  Location: [facility] CATH LAB;  Service: Cardiovascular;  Laterality: N/A;  . left elbow tendon surgery    . LEFT HEART CATH AND CORS/GRAFTS ANGIOGRAPHY N/A 01/22/2018   Procedure: LEFT HEART CATH AND CORS/GRAFTS ANGIOGRAPHY;  Surgeon: Leonie Man, MD;  Location: [place] CV LAB;  Service: Cardiovascular;  Laterality: N/A;  . TEE WITHOUT CARDIOVERSION N/A 03/23/2014   Procedure: TRANSESOPHAGEAL ECHOCARDIOGRAM (TEE);  Surgeon: Burnell Blanks, MD;  Location: [place];  Service: Open Heart Surgery;  Laterality: N/A;  . TONSILLECTOMY    . TRANSCATHETER AORTIC VALVE REPLACEMENT, TRANSFEMORAL N/A 03/23/2014   Procedure: TRANSCATHETER AORTIC VALVE REPLACEMENT, TRANSFEMORAL;  Surgeon: Burnell Blanks, MD;  Location: [place];  Service: Open Heart Surgery;  Laterality: N/A;    Social History   Socioeconomic History  . Marital status: Married    Spouse name: Not on file  . Number of children: 3  . Years of education: Not on file  . Highest education level: Not on file  Occupational  History  . Occupation: Retired  Scientific laboratory technician  . Financial resource strain: Not hard at all  . Food insecurity    Worry: Never true    Inability: Never true  . Transportation needs    Medical: No    Non-medical: No  Tobacco Use  . Smoking status: Former Smoker    Quit date: 10/25/1952    Years since quitting: 65.9  . Smokeless tobacco: Never Used  Substance and Sexual Activity  . Alcohol use: Yes    Alcohol/week: 5.0 - 6.0 standard drinks    Types: 5 - 6 Glasses of wine per week    Comment: 5-6 times a week  . Drug use: No  . Sexual activity: Never  Lifestyle  . Physical activity    Days per week: Not on file    Minutes per session: Not on file  . Stress: Only a little  Relationships  . Social connections    Talks on phone: More than three times a week    Gets together: More than three times a week    Attends religious service: More than 4 times per year    Active member of club or organization: Not on file    Attends meetings of clubs or organizations: More than 4 times per year    Relationship status: Married  Other Topics Concern  . Not on file  Social History Narrative  . Not on file    Family History  Problem Relation Age of Onset  . Heart attack Father 2  . Coronary artery disease Other        family history is dignificant for early CAD in several members    Review of Systems  Constitutional: Negative for chills, fatigue and fever.  Respiratory: Negative for cough, shortness of breath and wheezing.   Cardiovascular: Negative for chest pain, palpitations and leg swelling.  Neurological: Negative for dizziness, light-headedness and headaches.       Objective:   Vitals:   09/29/18 1028  BP: 130/78  Pulse: 79  Resp: 18  Temp: 98.3 F (36.8 C)  SpO2: 99%   BP Readings from Last 3 Encounters:  09/29/18 130/78  09/15/18 (!) 121/53  09/06/18 (!) 106/52   Wt Readings from Last 3 Encounters:  09/29/18 142 lb 12.8 oz (64.8 kg)  09/15/18 142 lb (64.4  kg)  09/05/18 145 lb 4.8 oz (65.9 kg)   Body mass index is 22.37 kg/m.   Physical Exam    Constitutional: Appears well-developed and well-nourished. No distress.  HENT:  Head: Normocephalic and atraumatic.  Neck: Neck supple. No tracheal deviation present. No thyromegaly present.  No cervical lymphadenopathy Cardiovascular: Normal rate, regular rhythm and normal heart sounds.  No murmur heard. No carotid bruit .  1+ mildly pitting edema bilateral ankles and lower extremities-left leg slightly worse than right edema Pulmonary/Chest: Effort normal and breath sounds normal. No respiratory distress. No has no wheezes. No rales.  Skin: Skin is warm and dry. Not diaphoretic.  Psychiatric: Normal mood and affect. Behavior is normal.      Assessment & Plan:    See Problem List for Assessment and Plan of chronic medical problems.

## 2018-09-29 NOTE — Assessment & Plan Note (Signed)
Continue statin-Crestor 20 mg 40 mg daily

## 2018-09-29 NOTE — Assessment & Plan Note (Signed)
Resultant difficulty sleeping Currently taking diazepam at night, which is helping.  Using his CPAP nightly has also helped Continue above

## 2018-09-29 NOTE — Assessment & Plan Note (Signed)
In sinus rhythm, rate controlled Following with cardiology On amiodarone, carvedilol and Xarelto

## 2018-09-29 NOTE — Assessment & Plan Note (Signed)
Blood pressure good here Continue current medications

## 2018-10-01 DIAGNOSIS — I701 Atherosclerosis of renal artery: Secondary | ICD-10-CM | POA: Diagnosis not present

## 2018-10-01 DIAGNOSIS — I4891 Unspecified atrial fibrillation: Secondary | ICD-10-CM | POA: Diagnosis not present

## 2018-10-01 DIAGNOSIS — D649 Anemia, unspecified: Secondary | ICD-10-CM | POA: Diagnosis not present

## 2018-10-01 DIAGNOSIS — N183 Chronic kidney disease, stage 3 (moderate): Secondary | ICD-10-CM | POA: Diagnosis not present

## 2018-10-01 DIAGNOSIS — Z952 Presence of prosthetic heart valve: Secondary | ICD-10-CM | POA: Diagnosis not present

## 2018-10-01 DIAGNOSIS — I5033 Acute on chronic diastolic (congestive) heart failure: Secondary | ICD-10-CM | POA: Diagnosis not present

## 2018-10-01 DIAGNOSIS — I214 Non-ST elevation (NSTEMI) myocardial infarction: Secondary | ICD-10-CM | POA: Diagnosis not present

## 2018-10-01 DIAGNOSIS — N2581 Secondary hyperparathyroidism of renal origin: Secondary | ICD-10-CM | POA: Diagnosis not present

## 2018-10-01 DIAGNOSIS — D509 Iron deficiency anemia, unspecified: Secondary | ICD-10-CM | POA: Diagnosis not present

## 2018-10-02 ENCOUNTER — Ambulatory Visit (INDEPENDENT_AMBULATORY_CARE_PROVIDER_SITE_OTHER): Payer: Medicare Other | Admitting: *Deleted

## 2018-10-02 DIAGNOSIS — I255 Ischemic cardiomyopathy: Secondary | ICD-10-CM

## 2018-10-02 LAB — CUP PACEART REMOTE DEVICE CHECK
Battery Remaining Longevity: 64 mo
Battery Voltage: 2.97 V
Brady Statistic AP VP Percent: 93.79 %
Brady Statistic AP VS Percent: 0 %
Brady Statistic AS VP Percent: 6.2 %
Brady Statistic AS VS Percent: 0 %
Brady Statistic RA Percent Paced: 93.79 %
Brady Statistic RV Percent Paced: 99.97 %
Date Time Interrogation Session: 20200625073623
HighPow Impedance: 38 Ohm
HighPow Impedance: 44 Ohm
Implantable Lead Implant Date: 20110601
Implantable Lead Implant Date: 20110601
Implantable Lead Location: 753859
Implantable Lead Location: 753860
Implantable Lead Model: 5076
Implantable Lead Model: 6947
Implantable Pulse Generator Implant Date: 20171213
Lead Channel Impedance Value: 247 Ohm
Lead Channel Impedance Value: 304 Ohm
Lead Channel Impedance Value: 456 Ohm
Lead Channel Pacing Threshold Amplitude: 0.75 V
Lead Channel Pacing Threshold Amplitude: 1.75 V
Lead Channel Pacing Threshold Pulse Width: 0.4 ms
Lead Channel Pacing Threshold Pulse Width: 0.4 ms
Lead Channel Sensing Intrinsic Amplitude: 0.875 mV
Lead Channel Sensing Intrinsic Amplitude: 2.125 mV
Lead Channel Setting Pacing Amplitude: 1.5 V
Lead Channel Setting Pacing Amplitude: 2.5 V
Lead Channel Setting Pacing Pulse Width: 0.8 ms
Lead Channel Setting Sensing Sensitivity: 0.3 mV

## 2018-10-04 ENCOUNTER — Telehealth: Payer: Self-pay | Admitting: Internal Medicine

## 2018-10-04 NOTE — Telephone Encounter (Signed)
Cardiology Moonlighter Note  Returned call from patient's wife around 1AM. Patient has known history of HFrEF, AS, CAD, HTN, CKD. Wife states today patient was significantly more active than usual. Has been up and around the house busy doing a lot of more physical things than he typically does. He is now sitting, resting, asymptomatic. No chest pain, chest pressure, SOB, dizziness, lightheadedness, palpitations. His heart rate increased to 120bpm at peak activity today, but has not improved back to his baseline of <100bpm. Currently HR 113 bpm.   Given that the patient is currently asymptomatic, I recommended he rest and recheck his vital signs in the morning. Reassurance offered. He understands he should call back or come to the ED if he develops any of the above mentioned symptoms.   Marcie Mowers, MD Cardiology Fellow, PGY-6

## 2018-10-06 NOTE — Telephone Encounter (Signed)
Noted thanks °

## 2018-10-10 ENCOUNTER — Encounter: Payer: Self-pay | Admitting: Cardiology

## 2018-10-10 NOTE — Progress Notes (Signed)
Remote ICD transmission.   

## 2018-10-14 ENCOUNTER — Other Ambulatory Visit: Payer: Self-pay | Admitting: Nephrology

## 2018-10-15 ENCOUNTER — Other Ambulatory Visit: Payer: Self-pay

## 2018-10-15 ENCOUNTER — Ambulatory Visit (INDEPENDENT_AMBULATORY_CARE_PROVIDER_SITE_OTHER): Payer: Medicare Other | Admitting: Internal Medicine

## 2018-10-15 ENCOUNTER — Telehealth: Payer: Self-pay | Admitting: Internal Medicine

## 2018-10-15 VITALS — BP 125/58 | HR 66 | Ht 66.0 in | Wt 142.8 lb

## 2018-10-15 DIAGNOSIS — Z95810 Presence of automatic (implantable) cardiac defibrillator: Secondary | ICD-10-CM | POA: Diagnosis not present

## 2018-10-15 DIAGNOSIS — I480 Paroxysmal atrial fibrillation: Secondary | ICD-10-CM

## 2018-10-15 DIAGNOSIS — I255 Ischemic cardiomyopathy: Secondary | ICD-10-CM

## 2018-10-15 DIAGNOSIS — I2510 Atherosclerotic heart disease of native coronary artery without angina pectoris: Secondary | ICD-10-CM | POA: Diagnosis not present

## 2018-10-15 LAB — CUP PACEART INCLINIC DEVICE CHECK
Battery Remaining Longevity: 64 mo
Battery Voltage: 2.95 V
Brady Statistic AP VP Percent: 72.44 %
Brady Statistic AP VS Percent: 0 %
Brady Statistic AS VP Percent: 27.55 %
Brady Statistic AS VS Percent: 0.01 %
Brady Statistic RA Percent Paced: 69.87 %
Brady Statistic RV Percent Paced: 99.96 %
Date Time Interrogation Session: 20200708162652
HighPow Impedance: 41 Ohm
HighPow Impedance: 49 Ohm
Implantable Lead Implant Date: 20110601
Implantable Lead Implant Date: 20110601
Implantable Lead Location: 753859
Implantable Lead Location: 753860
Implantable Lead Model: 5076
Implantable Lead Model: 6947
Implantable Pulse Generator Implant Date: 20171213
Lead Channel Impedance Value: 304 Ohm
Lead Channel Impedance Value: 361 Ohm
Lead Channel Impedance Value: 475 Ohm
Lead Channel Pacing Threshold Amplitude: 0.75 V
Lead Channel Pacing Threshold Amplitude: 1.625 V
Lead Channel Pacing Threshold Pulse Width: 0.4 ms
Lead Channel Pacing Threshold Pulse Width: 0.4 ms
Lead Channel Sensing Intrinsic Amplitude: 0.75 mV
Lead Channel Sensing Intrinsic Amplitude: 0.875 mV
Lead Channel Sensing Intrinsic Amplitude: 2.125 mV
Lead Channel Sensing Intrinsic Amplitude: 5.375 mV
Lead Channel Setting Pacing Amplitude: 2 V
Lead Channel Setting Pacing Amplitude: 2.5 V
Lead Channel Setting Pacing Pulse Width: 0.8 ms
Lead Channel Setting Sensing Sensitivity: 0.3 mV

## 2018-10-15 NOTE — Progress Notes (Signed)
PCP:  Binnie Rail, MD Primary Cardiologist: Lauree Chandler, MD Electrophysiologist: Virl Axe, MD   [name] is a 85 y.o. male with h/o persistent a fib/atrial tachycardia, ischemic cardiomyopathy, chronic systolic dysfunction, Echo 08/2018 LVEF 50-55%, HTN, CKD III-IV followed by Dr. Lorrene Reid, and CAD s/p CABG.   Pt had recent admission from 5/26 - 04/10/70 for A/C systolic CHF. Diuresed 7 lbs. Initially required Bipap. Thought to be in sinus that admission by EKG and tele. Hgb 10.4 09/03/18.   He presents today for routine follow up. He has been feeling well since his last admission. He has taken extra diuretics on two occasions under direction from Dr. Lorrene Reid. He denies orthopnea, palpitations, dizziness, lightheadedness, or chest pain. His peripheral edema has been under control since admit.   DATE TEST EF   10/14 Echo   60 % AoSt   severe  2015 LHC  LIMA  SVG-OM,SVG RCA patent,  10/15 Echo   35-40 %  LAE severe  10/17 Echo  35-40   11/19 Echo  40-45%   5/20 Echo  50-55% (confirmed with PR)     Past Medical History:  Diagnosis Date  . Aortic stenosis, severe    s/p TAVR with Edwards Sapien 3 THV (size 26 mm, model # 9600TFX, serial # [id number])  . Arthritis   . Cardiac arrest ([place])   . Chronic systolic CHF (congestive heart failure) ([place]) 01/21/2018  . Coronary artery disease    a. s/p CABG in 1982 b. redo CABG in 1994 c. cath in 2015 showing occluded left main and RCA, patent LIMA to LAD, patent SVG to OM, patent SVG to RCA d. stable anatomy by repeat cath in 01/2018  . Essential hypertension   . Headache   . History of hiatal hernia   . Hyperlipidemia   . Kidney stones   . OSA (obstructive sleep apnea) 02/05/2018   Severe obstructive sleep apnea with an AHI of 36.8/h with no significant central sleep apnea.   Now on CPAP at  5 cm H2O  . Peripheral vascular disease ([place])   . S/P cardiac cath 01/22/18  01/23/2018   Past Surgical History:  Procedure  Laterality Date  . ADENOIDECTOMY    . APPENDECTOMY    . CARDIAC CATHETERIZATION   11/16/2009   . CARDIAC DEFIBRILLATOR PLACEMENT    . CARDIOVERSION N/A 02/24/2018   Procedure: CARDIOVERSION;  Surgeon: Sueanne Margarita, MD;  Location[facility] ENDOSCOPY;  Service: Cardiovascular;  Laterality: N/A;  . CARDIOVERSION N/A 08/21/2018   Procedure: CARDIOVERSION;  Surgeon: Skeet Latch, MD;  Location: [facility] ENDOSCOPY;  Service: Cardiovascular;  Laterality: N/A;  . cataract surgery      Bilateral cataract surgery  . CORONARY ARTERY BYPASS GRAFT     in 1982 and '84 with subsequent PTCI  . EP IMPLANTABLE DEVICE N/A 03/21/2016   Procedure: ICD Generator Changeout;  Surgeon: Deboraha Sprang, MD;  Location: [place] CV LAB;  Service: Cardiovascular;  Laterality: N/A;  . HERNIA REPAIR    . LEFT AND RIGHT HEART CATHETERIZATION WITH CORONARY/GRAFT ANGIOGRAM N/A 02/01/2014   Procedure: LEFT AND RIGHT HEART CATHETERIZATION WITH Beatrix Fetters;  Surgeon: Sinclair Grooms, MD;  Location: [facility] CATH LAB;  Service: Cardiovascular;  Laterality: N/A;  . left elbow tendon surgery    . LEFT HEART CATH AND CORS/GRAFTS ANGIOGRAPHY N/A 01/22/2018   Procedure: LEFT HEART CATH AND CORS/GRAFTS ANGIOGRAPHY;  Surgeon: Leonie Man, MD;  Location: [place] CV LAB;  Service: Cardiovascular;  Laterality: N/A;  . TEE WITHOUT CARDIOVERSION N/A 03/23/2014   Procedure: TRANSESOPHAGEAL ECHOCARDIOGRAM (TEE);  Surgeon: Burnell Blanks, MD;  Location: [place];  Service: Open Heart Surgery;  Laterality: N/A;  . TONSILLECTOMY    . TRANSCATHETER AORTIC VALVE REPLACEMENT, TRANSFEMORAL N/A 03/23/2014   Procedure: TRANSCATHETER AORTIC VALVE REPLACEMENT, TRANSFEMORAL;  Surgeon: Burnell Blanks, MD;  Location: [place];  Service: Open Heart Surgery;  Laterality: N/A;   Current Outpatient Medications  Medication Sig Dispense Refill  . acetaminophen (TYLENOL) 500 MG tablet Take 500 mg by mouth every 6 (six) hours as needed  for moderate pain or headache.    Marland Kitchen amiodarone (PACERONE) 200 MG tablet Take 1 tablet (200 mg total) by mouth daily. 30 tablet 6  . amLODipine (NORVASC) 5 MG tablet TAKE 1 TABLET BY MOUTH DAILY. (Patient taking differently: Take 5 mg by mouth daily. ) 90 tablet 3  . b complex vitamins capsule Take 1 capsule by mouth daily. 90 capsule 0  . carvedilol (COREG) 6.25 MG tablet Take 1 tablet (6.25 mg total) by mouth 2 (two) times daily with a meal for 30 days. 180 tablet 0  . Coenzyme Q10 (COQ10) 100 MG CAPS Take 100 mg by mouth at bedtime.    . diazepam (VALIUM) 5 MG tablet TAKE 1 TABLET BY MOUTH ONCE DAILY AS NEEDED 30 tablet 2  . ferrous sulfate 325 (65 FE) MG tablet Take 1 tablet (325 mg total) by mouth daily with breakfast.  3  . furosemide (LASIX) 80 MG tablet Take 1 tablet (80 mg total) by mouth 2 (two) times daily. 180 tablet 3  . Krill Oil 500 MG CAPS Take 500 mg by mouth at bedtime.    . Magnesium 400 MG CAPS Take 400 mg by mouth at bedtime.    . Melatonin 1 MG TABS Take 3 mg by mouth at bedtime as needed (sleep).     . Multiple Vitamin (MULTIVITAMIN WITH MINERALS) TABS tablet Take 1 tablet by mouth daily.    . nitroGLYCERIN (NITROSTAT) 0.4 MG SL tablet Place 1 tablet (0.4 mg total) under the tongue every 5 (five) minutes as needed for chest pain (for chest pain (MAX of 3 doses)). 25 tablet 6  . potassium chloride SA (K-DUR) 20 MEQ tablet Take 1 tablet (20 mEq total) by mouth daily. 30 tablet 5  . Propylene Glycol (SYSTANE COMPLETE) 0.6 % SOLN Place 1 drop into both eyes daily as needed (irritation).    . rosuvastatin (CRESTOR) 40 MG tablet TAKE 1 TABLET BY MOUTH DAILY. 90 tablet 3  . senna (SENOKOT) 8.6 MG TABS tablet Take 1 tablet by mouth daily as needed for mild constipation.    . trospium (SANCTURA) 20 MG tablet Take 20 mg by mouth 2 (two) times daily.    Alveda Reasons 15 MG TABS tablet TAKE 1 TABLET (15 MG TOTAL) BY MOUTH DAILY WITH SUPPER. (Patient taking differently: Take 15 mg by mouth  daily with supper. ) 30 tablet 5   No current facility-administered medications for this visit.    Allergies  Allergen Reactions  . Cyclobenzaprine Hcl Rash  . Tetracycline Rash        Social History   Socioeconomic History  . Marital status: Married    Spouse name: Not on file  . Number of children: 3  . Years of education: Not on file  . Highest education level: Not on file  Occupational History  . Occupation: Retired  Scientific laboratory technician  . Financial resource strain: Not  hard at all  . Food insecurity    Worry: Never true    Inability: Never true  . Transportation needs    Medical: No    Non-medical: No  Tobacco Use  . Smoking status: Former Smoker    Quit date: 10/25/1952    Years since quitting: 66.0  . Smokeless tobacco: Never Used  Substance and Sexual Activity  . Alcohol use: Yes    Alcohol/week: 5.0 - 6.0 standard drinks    Types: 5 - 6 Glasses of wine per week    Comment: 5-6 times a week  . Drug use: No  . Sexual activity: Never  Lifestyle  . Physical activity    Days per week: Not on file    Minutes per session: Not on file  . Stress: Only a little  Relationships  . Social connections    Talks on phone: More than three times a week    Gets together: More than three times a week    Attends religious service: More than 4 times per year    Active member of club or organization: Not on file    Attends meetings of clubs or organizations: More than 4 times per year    Relationship status: Married  . Intimate partner violence    Fear of current or ex partner: No    Emotionally abused: No    Physically abused: No    Forced sexual activity: No  Other Topics Concern  . Not on file  Social History Narrative  . Not on file   Review of Systems: General: No chills, fever, night sweats or weight changes  Cardiovascular:  No chest pain, dyspnea on exertion, edema, orthopnea, palpitations, paroxysmal nocturnal dyspnea Dermatological: No rash, lesions or masses  Respiratory: No cough, dyspnea Urologic: No hematuria, dysuria Abdominal: No nausea, vomiting, diarrhea, bright red blood per rectum, melena, or hematemesis Neurologic: No visual changes, weakness, changes in mental status All other systems reviewed and are otherwise negative except as noted above.  Physical Exam: Vitals:   10/15/18 1424  BP: (!) 125/58  Pulse: 66  SpO2: 98%  Weight: 142 lb 12.8 oz (64.8 kg)  Height: 5\' 6"  (1.676 m)   GEN- The patient is well appearing, alert and oriented x 3 today.   Head- normocephalic, atraumatic Eyes-  Sclera clear, conjunctiva pink Ears- hearing intact Oropharynx- clear Neck- supple,  Lungs- Clear to ausculation bilaterally, normal work of breathing Chest- pacemaker pocket is without hematoma/ bruit Heart- Regular rate and rhythm, no murmurs, rubs or gallops, PMI not laterally displaced GI- soft, NT, ND, + BS Extremities- no clubbing, cyanosis, or edema Neuro- strength and sensation are intact  Pacemaker interrogation- reviewed in detail today,  See paper chart   EKG is ordered today.  Personal review shows AV sequential pacing at 69 bpm. QRS 198 ms.  Assessment and Plan:  Sudden cardiac death-aborted   AUTOMATIC IMPLANTABLE CARDIAC DEFIBRILLATOR SITU   .   S/P CABG (coronary artery bypass graft)     Ischemic Cardiomyopathy - Denies exertional chest pain. Stable, though severe, disease on cath 01/2018. No ACE/ARB/ARNi with CKD. EF 50-55% 08/2018.  Aortic stenosis-s/p TAVR in 2015 - Stable by echo 08/2018  Aortic insufficiency - trivial by echo 08/2018  Renal Insufficiency grade 3-4 - Follows with Dr. Lorrene Reid.  Benign hypertensive heart disease - Controlled on current regimen in setting of treating his systolic CHF/ICM.  Anemia - suspected to be IDA. Hgb 10.4 09/03/2018.  High Grade Heart Block including  2:1 AV block: He is V-pacing 100% of the time currently. ? underlying junctional escape at 42 bpm today vs 2:1 AV block   Persistent atrial fibrillation//atrial tach - He was out of rhythm on presentation today. His total AF/AT burden is 8.2%. Compliant with Xarelto despite anemia, but no active bleeding or source. Thought to be iron deficiency and like has component of chronic disease as well.     Dr. Caryl Comes was able to utilize NIPS to convert patient back into sinus rhythm.   Chronic hypoxic respiratory failure - Wears O2 at night. This should be deferred to his PCP.   Shirley Friar, PA-C  10/15/18 1:53 PM  currenty stable and with scant symptoms despite atrial tachy on admission and recent hospitalization which improved with need for DCCV  Not sanguine that he will hold sinus rhythm and may well need increased amiodarone  Will follow

## 2018-10-15 NOTE — Telephone Encounter (Signed)

## 2018-10-15 NOTE — Patient Instructions (Addendum)

## 2018-10-20 ENCOUNTER — Encounter (HOSPITAL_COMMUNITY): Payer: Self-pay | Admitting: Family Medicine

## 2018-10-20 ENCOUNTER — Inpatient Hospital Stay (HOSPITAL_COMMUNITY)
Admission: EM | Admit: 2018-10-20 | Discharge: 2018-11-08 | DRG: 291 | Disposition: E | Payer: Medicare Other | Source: Ambulatory Visit | Attending: Internal Medicine | Admitting: Internal Medicine

## 2018-10-20 ENCOUNTER — Encounter (HOSPITAL_COMMUNITY)
Admission: RE | Admit: 2018-10-20 | Discharge: 2018-10-20 | Disposition: A | Discharge status: Home / Self Care | Payer: Medicare Other | Source: Ambulatory Visit | Attending: Nephrology | Admitting: Nephrology

## 2018-10-20 ENCOUNTER — Other Ambulatory Visit: Payer: Self-pay

## 2018-10-20 ENCOUNTER — Ambulatory Visit (HOSPITAL_COMMUNITY)
Admission: RE | Admit: 2018-10-20 | Discharge: 2018-10-20 | Disposition: A | Discharge status: Home / Self Care | Payer: Medicare Other | Source: Ambulatory Visit | Attending: Nephrology | Admitting: Nephrology

## 2018-10-20 ENCOUNTER — Emergency Department (HOSPITAL_COMMUNITY): Payer: Medicare Other

## 2018-10-20 DIAGNOSIS — E871 Hypo-osmolality and hyponatremia: Secondary | ICD-10-CM | POA: Diagnosis not present

## 2018-10-20 DIAGNOSIS — Z7901 Long term (current) use of anticoagulants: Secondary | ICD-10-CM

## 2018-10-20 DIAGNOSIS — N2581 Secondary hyperparathyroidism of renal origin: Secondary | ICD-10-CM | POA: Diagnosis not present

## 2018-10-20 DIAGNOSIS — N189 Chronic kidney disease, unspecified: Secondary | ICD-10-CM | POA: Diagnosis not present

## 2018-10-20 DIAGNOSIS — I4891 Unspecified atrial fibrillation: Secondary | ICD-10-CM | POA: Diagnosis not present

## 2018-10-20 DIAGNOSIS — D649 Anemia, unspecified: Secondary | ICD-10-CM | POA: Diagnosis present

## 2018-10-20 DIAGNOSIS — Z9981 Dependence on supplemental oxygen: Secondary | ICD-10-CM

## 2018-10-20 DIAGNOSIS — I248 Other forms of acute ischemic heart disease: Secondary | ICD-10-CM | POA: Diagnosis not present

## 2018-10-20 DIAGNOSIS — R17 Unspecified jaundice: Secondary | ICD-10-CM | POA: Diagnosis not present

## 2018-10-20 DIAGNOSIS — I739 Peripheral vascular disease, unspecified: Secondary | ICD-10-CM | POA: Diagnosis present

## 2018-10-20 DIAGNOSIS — I214 Non-ST elevation (NSTEMI) myocardial infarction: Secondary | ICD-10-CM | POA: Diagnosis not present

## 2018-10-20 DIAGNOSIS — Z951 Presence of aortocoronary bypass graft: Secondary | ICD-10-CM | POA: Diagnosis not present

## 2018-10-20 DIAGNOSIS — I5043 Acute on chronic combined systolic (congestive) and diastolic (congestive) heart failure: Secondary | ICD-10-CM | POA: Diagnosis not present

## 2018-10-20 DIAGNOSIS — R630 Anorexia: Secondary | ICD-10-CM | POA: Diagnosis present

## 2018-10-20 DIAGNOSIS — J9621 Acute and chronic respiratory failure with hypoxia: Secondary | ICD-10-CM | POA: Diagnosis not present

## 2018-10-20 DIAGNOSIS — I255 Ischemic cardiomyopathy: Secondary | ICD-10-CM | POA: Diagnosis present

## 2018-10-20 DIAGNOSIS — E785 Hyperlipidemia, unspecified: Secondary | ICD-10-CM | POA: Diagnosis present

## 2018-10-20 DIAGNOSIS — I4819 Other persistent atrial fibrillation: Secondary | ICD-10-CM | POA: Diagnosis not present

## 2018-10-20 DIAGNOSIS — R918 Other nonspecific abnormal finding of lung field: Secondary | ICD-10-CM | POA: Diagnosis not present

## 2018-10-20 DIAGNOSIS — E210 Primary hyperparathyroidism: Secondary | ICD-10-CM | POA: Diagnosis present

## 2018-10-20 DIAGNOSIS — I5023 Acute on chronic systolic (congestive) heart failure: Secondary | ICD-10-CM | POA: Diagnosis present

## 2018-10-20 DIAGNOSIS — Z20828 Contact with and (suspected) exposure to other viral communicable diseases: Secondary | ICD-10-CM | POA: Diagnosis present

## 2018-10-20 DIAGNOSIS — I5033 Acute on chronic diastolic (congestive) heart failure: Secondary | ICD-10-CM | POA: Diagnosis not present

## 2018-10-20 DIAGNOSIS — I2510 Atherosclerotic heart disease of native coronary artery without angina pectoris: Secondary | ICD-10-CM | POA: Diagnosis not present

## 2018-10-20 DIAGNOSIS — Z8674 Personal history of sudden cardiac arrest: Secondary | ICD-10-CM

## 2018-10-20 DIAGNOSIS — N179 Acute kidney failure, unspecified: Secondary | ICD-10-CM | POA: Diagnosis present

## 2018-10-20 DIAGNOSIS — I130 Hypertensive heart and chronic kidney disease with heart failure and stage 1 through stage 4 chronic kidney disease, or unspecified chronic kidney disease: Secondary | ICD-10-CM | POA: Diagnosis not present

## 2018-10-20 DIAGNOSIS — Z952 Presence of prosthetic heart valve: Secondary | ICD-10-CM

## 2018-10-20 DIAGNOSIS — Z8249 Family history of ischemic heart disease and other diseases of the circulatory system: Secondary | ICD-10-CM

## 2018-10-20 DIAGNOSIS — G4733 Obstructive sleep apnea (adult) (pediatric): Secondary | ICD-10-CM | POA: Diagnosis present

## 2018-10-20 DIAGNOSIS — Z888 Allergy status to other drugs, medicaments and biological substances status: Secondary | ICD-10-CM

## 2018-10-20 DIAGNOSIS — T462X5A Adverse effect of other antidysrhythmic drugs, initial encounter: Secondary | ICD-10-CM | POA: Diagnosis present

## 2018-10-20 DIAGNOSIS — E875 Hyperkalemia: Secondary | ICD-10-CM | POA: Diagnosis present

## 2018-10-20 DIAGNOSIS — Z66 Do not resuscitate: Secondary | ICD-10-CM | POA: Diagnosis not present

## 2018-10-20 DIAGNOSIS — I701 Atherosclerosis of renal artery: Secondary | ICD-10-CM | POA: Diagnosis not present

## 2018-10-20 DIAGNOSIS — N183 Chronic kidney disease, stage 3 unspecified: Secondary | ICD-10-CM | POA: Diagnosis present

## 2018-10-20 DIAGNOSIS — J9620 Acute and chronic respiratory failure, unspecified whether with hypoxia or hypercapnia: Secondary | ICD-10-CM | POA: Diagnosis not present

## 2018-10-20 DIAGNOSIS — R0902 Hypoxemia: Secondary | ICD-10-CM

## 2018-10-20 DIAGNOSIS — Z95810 Presence of automatic (implantable) cardiac defibrillator: Secondary | ICD-10-CM

## 2018-10-20 DIAGNOSIS — Z881 Allergy status to other antibiotic agents status: Secondary | ICD-10-CM

## 2018-10-20 DIAGNOSIS — D696 Thrombocytopenia, unspecified: Secondary | ICD-10-CM | POA: Diagnosis not present

## 2018-10-20 DIAGNOSIS — R54 Age-related physical debility: Secondary | ICD-10-CM | POA: Diagnosis present

## 2018-10-20 DIAGNOSIS — R0602 Shortness of breath: Secondary | ICD-10-CM | POA: Diagnosis not present

## 2018-10-20 DIAGNOSIS — R432 Parageusia: Secondary | ICD-10-CM | POA: Diagnosis present

## 2018-10-20 DIAGNOSIS — E213 Hyperparathyroidism, unspecified: Secondary | ICD-10-CM | POA: Diagnosis not present

## 2018-10-20 DIAGNOSIS — Z79899 Other long term (current) drug therapy: Secondary | ICD-10-CM

## 2018-10-20 DIAGNOSIS — D509 Iron deficiency anemia, unspecified: Secondary | ICD-10-CM | POA: Diagnosis not present

## 2018-10-20 DIAGNOSIS — Z87891 Personal history of nicotine dependence: Secondary | ICD-10-CM

## 2018-10-20 DIAGNOSIS — Z87442 Personal history of urinary calculi: Secondary | ICD-10-CM

## 2018-10-20 DIAGNOSIS — J810 Acute pulmonary edema: Secondary | ICD-10-CM | POA: Diagnosis not present

## 2018-10-20 LAB — POCT I-STAT 7, (LYTES, BLD GAS, ICA,H+H)
Acid-base deficit: 3 mmol/L — ABNORMAL HIGH (ref 0.0–2.0)
Bicarbonate: 20.5 mmol/L (ref 20.0–28.0)
Calcium, Ion: 1.38 mmol/L (ref 1.15–1.40)
HCT: 33 % — ABNORMAL LOW (ref 39.0–52.0)
Hemoglobin: 11.2 g/dL — ABNORMAL LOW (ref 13.0–17.0)
O2 Saturation: 81 %
Potassium: 6.2 mmol/L — ABNORMAL HIGH (ref 3.5–5.1)
Sodium: 126 mmol/L — ABNORMAL LOW (ref 135–145)
TCO2: 21 mmol/L — ABNORMAL LOW (ref 22–32)
pCO2 arterial: 30.4 mmHg — ABNORMAL LOW (ref 32.0–48.0)
pH, Arterial: 7.437 (ref 7.350–7.450)
pO2, Arterial: 43 mmHg — ABNORMAL LOW (ref 83.0–108.0)

## 2018-10-20 LAB — CBC
HCT: 33.2 % — ABNORMAL LOW (ref 39.0–52.0)
Hemoglobin: 10.4 g/dL — ABNORMAL LOW (ref 13.0–17.0)
MCH: 28.5 pg (ref 26.0–34.0)
MCHC: 31.3 g/dL (ref 30.0–36.0)
MCV: 91 fL (ref 80.0–100.0)
Platelets: 141 10*3/uL — ABNORMAL LOW (ref 150–400)
RBC: 3.65 MIL/uL — ABNORMAL LOW (ref 4.22–5.81)
RDW: 25.7 % — ABNORMAL HIGH (ref 11.5–15.5)
WBC: 13.1 10*3/uL — ABNORMAL HIGH (ref 4.0–10.5)
nRBC: 0.2 % (ref 0.0–0.2)

## 2018-10-20 LAB — SARS CORONAVIRUS 2 BY RT PCR (HOSPITAL ORDER, PERFORMED IN ~~LOC~~ HOSPITAL LAB): SARS Coronavirus 2: NEGATIVE

## 2018-10-20 LAB — COMPREHENSIVE METABOLIC PANEL
ALT: 55 U/L — ABNORMAL HIGH (ref 0–44)
AST: 59 U/L — ABNORMAL HIGH (ref 15–41)
Albumin: 3.6 g/dL (ref 3.5–5.0)
Alkaline Phosphatase: 61 U/L (ref 38–126)
Anion gap: 12 (ref 5–15)
BUN: 70 mg/dL — ABNORMAL HIGH (ref 8–23)
CO2: 22 mmol/L (ref 22–32)
Calcium: 10.9 mg/dL — ABNORMAL HIGH (ref 8.9–10.3)
Chloride: 94 mmol/L — ABNORMAL LOW (ref 98–111)
Creatinine, Ser: 3.6 mg/dL — ABNORMAL HIGH (ref 0.61–1.24)
GFR calc Af Amer: 17 mL/min — ABNORMAL LOW (ref >60)
GFR calc non Af Amer: 15 mL/min — ABNORMAL LOW (ref >60)
Glucose, Bld: 191 mg/dL — ABNORMAL HIGH (ref 70–99)
Potassium: 6.5 mmol/L (ref 3.5–5.1)
Sodium: 128 mmol/L — ABNORMAL LOW (ref 135–145)
Total Bilirubin: 1.8 mg/dL — ABNORMAL HIGH (ref 0.3–1.2)
Total Protein: 6.9 g/dL (ref 6.5–8.1)

## 2018-10-20 LAB — TROPONIN I (HIGH SENSITIVITY)
Troponin I (High Sensitivity): 213 ng/L (ref <18)
Troponin I (High Sensitivity): 207 ng/L (ref <18)

## 2018-10-20 LAB — BRAIN NATRIURETIC PEPTIDE: B Natriuretic Peptide: 3312.4 pg/mL — ABNORMAL HIGH (ref 0.0–100.0)

## 2018-10-20 LAB — I-STAT CREATININE, ED: Creatinine, Ser: 3.6 mg/dL — ABNORMAL HIGH (ref 0.61–1.24)

## 2018-10-20 MED ORDER — DIAZEPAM 5 MG PO TABS
5.0000 mg | ORAL_TABLET | Freq: Every evening | ORAL | Status: DC | PRN
  Administered 2018-10-21: 5 mg via ORAL
  Filled 2018-10-20: qty 1

## 2018-10-20 MED ORDER — ACETAMINOPHEN 325 MG PO TABS: 650.0000 mg | ORAL_TABLET | ORAL | Status: DC | PRN

## 2018-10-20 MED ORDER — CARVEDILOL 6.25 MG PO TABS: 6.2500 mg | ORAL_TABLET | Freq: Two times a day (BID) | ORAL | Status: DC

## 2018-10-20 MED ORDER — ONDANSETRON HCL 4 MG/2ML IJ SOLN: 4.0000 mg | Freq: Four times a day (QID) | INTRAMUSCULAR | Status: DC | PRN

## 2018-10-20 MED ORDER — SODIUM CHLORIDE 0.9% FLUSH
3.0000 mL | Freq: Once | INTRAVENOUS | Status: AC
  Administered 2018-10-20: 3 mL via INTRAVENOUS

## 2018-10-20 MED ORDER — DARIFENACIN HYDROBROMIDE ER 7.5 MG PO TB24
7.5000 mg | ORAL_TABLET | Freq: Every day | ORAL | Status: DC
  Filled 2018-10-20 (×2): qty 1

## 2018-10-20 MED ORDER — SODIUM CHLORIDE 0.9% FLUSH: 3.0000 mL | INTRAVENOUS | Status: DC | PRN

## 2018-10-20 MED ORDER — SODIUM CHLORIDE 0.9 % IV SOLN: 250.0000 mL | INTRAVENOUS | Status: DC | PRN

## 2018-10-20 MED ORDER — SODIUM ZIRCONIUM CYCLOSILICATE 10 G PO PACK
10.0000 g | PACK | Freq: Once | ORAL | Status: AC
  Administered 2018-10-20: 10 g via ORAL
  Filled 2018-10-20: qty 1

## 2018-10-20 MED ORDER — MELATONIN 3 MG PO TABS
3.0000 mg | ORAL_TABLET | Freq: Every evening | ORAL | Status: DC | PRN
  Filled 2018-10-20: qty 1

## 2018-10-20 MED ORDER — FUROSEMIDE 10 MG/ML IJ SOLN
40.0000 mg | Freq: Once | INTRAMUSCULAR | Status: AC
  Administered 2018-10-20: 40 mg via INTRAVENOUS
  Filled 2018-10-20: qty 4

## 2018-10-20 MED ORDER — RIVAROXABAN 15 MG PO TABS
15.0000 mg | ORAL_TABLET | Freq: Every day | ORAL | Status: DC
  Administered 2018-10-20: 15 mg via ORAL
  Filled 2018-10-20: qty 1

## 2018-10-20 MED ORDER — INSULIN ASPART 100 UNIT/ML ~~LOC~~ SOLN
5.0000 [IU] | Freq: Once | SUBCUTANEOUS | Status: AC
  Administered 2018-10-20: 5 [IU] via SUBCUTANEOUS

## 2018-10-20 MED ORDER — ROSUVASTATIN CALCIUM 20 MG PO TABS: 40.0000 mg | ORAL_TABLET | Freq: Every day | ORAL | Status: DC

## 2018-10-20 MED ORDER — DEXTROSE 50 % IV SOLN
50.0000 mL | Freq: Once | INTRAVENOUS | Status: AC
  Administered 2018-10-20: 50 mL via INTRAVENOUS
  Filled 2018-10-20: qty 50

## 2018-10-20 MED ORDER — FUROSEMIDE 10 MG/ML IJ SOLN
80.0000 mg | Freq: Two times a day (BID) | INTRAMUSCULAR | Status: DC
  Administered 2018-10-20: 80 mg via INTRAVENOUS
  Filled 2018-10-20: qty 8

## 2018-10-20 MED ORDER — SODIUM CHLORIDE 0.9% FLUSH
3.0000 mL | Freq: Two times a day (BID) | INTRAVENOUS | Status: DC
  Administered 2018-10-20: 3 mL via INTRAVENOUS

## 2018-10-20 MED ORDER — TECHNETIUM TC 99M SESTAMIBI - CARDIOLITE
25.0000 | Freq: Once | INTRAVENOUS | Status: AC | PRN
  Administered 2018-10-20: 25 via INTRAVENOUS

## 2018-10-20 MED ORDER — SODIUM BICARBONATE 8.4 % IV SOLN
50.0000 meq | Freq: Once | INTRAVENOUS | Status: AC
  Administered 2018-10-20: 50 meq via INTRAVENOUS
  Filled 2018-10-20: qty 50

## 2018-10-20 NOTE — ED Notes (Signed)
Pt. Bladder scan 33ml

## 2018-10-20 NOTE — ED Notes (Signed)
Pt given Bipap

## 2018-10-20 NOTE — Progress Notes (Signed)
RT called to pt's room while in the ED.  Pt's SATS dropped in to the low 80's.  Pt placed on Bipap.  Pt transported from ED 26 to [id number] with no complications.

## 2018-10-20 NOTE — H&P (Signed)
History and Physical    [name] INO:[id number] DOB: [birth year] DOA: 11/01/2018  PCP: Binnie Rail, MD   Patient coming from: Home   Chief Complaint: Hypoxia, generalized weakness   HPI: [name] is a 85 y.o. male with medical history significant for coronary artery disease status post CABG, ischemic cardiomyopathy, chronic kidney disease stage III, atrial fibrillation on Xarelto, aortic stenosis status post TAVR, OSA on CPAP, and chronic hypoxic respiratory failure, now presenting to the emergency department for evaluation of generalized weakness and hypoxia.  Patient was discharged from the hospital approximately 6 weeks ago after management of acute on chronic CHF and acute on chronic renal failure, had been doing fairly well at home, but over the last few days, he has gained some weight and required increasing levels of oxygen at home.  He has been feeling generally weak without any fevers, chills, chest pain, palpitations, or sick contacts.  He reports a recent loss of appetite, explaining that food just does not taste good anymore, and his wife states that he has been drinking increasing amounts of juices and other beverages instead of eating.  He was seen by his nephrologist, noted to be hypoxic on increased amounts of supplemental oxygen, and was directed to the ED.  ED Course: Upon arrival to the ED, patient is found to be afebrile, saturating 87% on 6 L/min, and with stable blood pressure.  EKG features a paced rhythm and chest x-ray findings are consistent with CHF.  Chemistry panel is notable for sodium 128, potassium 6.5, BUN 70, and creatinine 3.60, up from 1.8 six weeks ago.  CBC features a stable normocytic anemia and mild thrombocytopenia.  Troponin is elevated to 213 and BNP elevated to 3312.  Patient was treated with insulin, Lasix, Lokelma, and bicarbonate in the ED.  Nephrology and cardiology were consulted by the ED physician and a medical admission was recommended.   Review of Systems:  All other systems reviewed and apart from HPI, are negative.  Past Medical History:  Diagnosis Date  . Aortic stenosis, severe    s/p TAVR with Edwards Sapien 3 THV (size 26 mm, model # 9600TFX, serial # [id number])  . Arthritis   . Cardiac arrest ([place])   . Chronic systolic CHF (congestive heart failure) ([place]) 01/21/2018  . Coronary artery disease    a. s/p CABG in 1982 b. redo CABG in 1994 c. cath in 2015 showing occluded left main and RCA, patent LIMA to LAD, patent SVG to OM, patent SVG to RCA d. stable anatomy by repeat cath in 01/2018  . Essential hypertension   . Headache   . History of hiatal hernia   . Hyperlipidemia   . Kidney stones   . OSA (obstructive sleep apnea) 02/05/2018   Severe obstructive sleep apnea with an AHI of 36.8/h with no significant central sleep apnea.   Now on CPAP at  5 cm H2O  . Peripheral vascular disease ([place])   . S/P cardiac cath 01/22/18  01/23/2018    Past Surgical History:  Procedure Laterality Date  . ADENOIDECTOMY    . APPENDECTOMY    . CARDIAC CATHETERIZATION   11/16/2009   . CARDIAC DEFIBRILLATOR PLACEMENT    . CARDIOVERSION N/A 02/24/2018   Procedure: CARDIOVERSION;  Surgeon: Sueanne Margarita, MD;  Location: [facility] ENDOSCOPY;  Service: Cardiovascular;  Laterality: N/A;  . CARDIOVERSION N/A 08/21/2018   Procedure: CARDIOVERSION;  Surgeon: Skeet Latch, MD;  Location: [place];  Service: Cardiovascular;  Laterality: N/A;  .  cataract surgery      Bilateral cataract surgery  . CORONARY ARTERY BYPASS GRAFT     in 1982 and '84 with subsequent PTCI  . EP IMPLANTABLE DEVICE N/A 03/21/2016   Procedure: ICD Generator Changeout;  Surgeon: Deboraha Sprang, MD;  Location: [place] CV LAB;  Service: Cardiovascular;  Laterality: N/A;  . HERNIA REPAIR    . LEFT AND RIGHT HEART CATHETERIZATION WITH CORONARY/GRAFT ANGIOGRAM N/A 02/01/2014   Procedure: LEFT AND RIGHT HEART CATHETERIZATION WITH Beatrix Fetters;  Surgeon:  Sinclair Grooms, MD;  Location: [facility] CATH LAB;  Service: Cardiovascular;  Laterality: N/A;  . left elbow tendon surgery    . LEFT HEART CATH AND CORS/GRAFTS ANGIOGRAPHY N/A 01/22/2018   Procedure: LEFT HEART CATH AND CORS/GRAFTS ANGIOGRAPHY;  Surgeon: Leonie Man, MD;  Location: [place] CV LAB;  Service: Cardiovascular;  Laterality: N/A;  . TEE WITHOUT CARDIOVERSION N/A 03/23/2014   Procedure: TRANSESOPHAGEAL ECHOCARDIOGRAM (TEE);  Surgeon: Burnell Blanks, MD;  Location: [place];  Service: Open Heart Surgery;  Laterality: N/A;  . TONSILLECTOMY    . TRANSCATHETER AORTIC VALVE REPLACEMENT, TRANSFEMORAL N/A 03/23/2014   Procedure: TRANSCATHETER AORTIC VALVE REPLACEMENT, TRANSFEMORAL;  Surgeon: Burnell Blanks, MD;  Location: [place];  Service: Open Heart Surgery;  Laterality: N/A;     reports that he quit smoking about 66 years ago. He has never used smokeless tobacco. He reports current alcohol use of about 5.0 - 6.0 standard drinks of alcohol per week. He reports that he does not use drugs.  Allergies  Allergen Reactions  . Cyclobenzaprine Hcl Rash  . Tetracycline Rash         Family History  Problem Relation Age of Onset  . Heart attack Father 66  . Coronary artery disease Other        family history is dignificant for early CAD in several members     Prior to Admission medications   Medication Sig Start Date End Date Taking? Authorizing Provider  acetaminophen (TYLENOL) 500 MG tablet Take 500 mg by mouth every 6 (six) hours as needed for moderate pain or headache.   Yes [provider]  amLODipine (NORVASC) 5 MG tablet TAKE 1 TABLET BY MOUTH DAILY. Patient taking differently: Take 5 mg by mouth daily.  12/23/17  Yes Burnell Blanks, MD  b complex vitamins capsule Take 1 capsule by mouth daily. 09/02/18  Yes Burns, Claudina Lick, MD  carvedilol (COREG) 6.25 MG tablet Take 1 tablet (6.25 mg total) by mouth 2 (two) times daily with a meal for 30 days.  09/22/18 11/16/18 Yes Burns, Claudina Lick, MD  Coenzyme Q10-Vitamin E (QUNOL ULTRA COQ10 PO) Take by mouth at bedtime. liquid   Yes [provider]  diazepam (VALIUM) 5 MG tablet TAKE 1 TABLET BY MOUTH ONCE DAILY AS NEEDED Patient taking differently: Take 5 mg by mouth at bedtime as needed for anxiety (sleep).  05/19/18  Yes Burnell Blanks, MD  ferrous sulfate 325 (65 FE) MG tablet Take 1 tablet (325 mg total) by mouth daily with breakfast. 08/10/18  Yes Burns, Claudina Lick, MD  furosemide (LASIX) 80 MG tablet Take 1 tablet (80 mg total) by mouth 2 (two) times daily. Patient taking differently: Take 80 mg by mouth 2 (two) times daily with a meal. Breakfast and supper 09/06/18  Yes Strader, [place] M, PA-C  Krill Oil 500 MG CAPS Take 500 mg by mouth at bedtime.   Yes [provider]  Magnesium 400 MG  CAPS Take 400 mg by mouth at bedtime.   Yes [provider]  Melatonin 1 MG TABS Take 2 mg by mouth at bedtime as needed (sleep).    Yes [provider]  Multiple Vitamin (MULTIVITAMIN WITH MINERALS) TABS tablet Take 1 tablet by mouth daily.   Yes [provider]  nitroGLYCERIN (NITROSTAT) 0.4 MG SL tablet Place 1 tablet (0.4 mg total) under the tongue every 5 (five) minutes as needed for chest pain (for chest pain (MAX of 3 doses)). 04/12/15  Yes Burnell Blanks, MD  potassium chloride SA (K-DUR) 20 MEQ tablet Take 1 tablet (20 mEq total) by mouth daily. 09/06/18  Yes Strader, [place] M, PA-C  Propylene Glycol (SYSTANE COMPLETE) 0.6 % SOLN Place 1 drop into both eyes daily as needed (irritation).   Yes [provider]  rosuvastatin (CRESTOR) 40 MG tablet TAKE 1 TABLET BY MOUTH DAILY. Patient taking differently: Take 40 mg by mouth daily with supper.  12/23/17  Yes Burnell Blanks, MD  trospium (SANCTURA) 20 MG tablet Take 20 mg by mouth 2 (two) times daily.   Yes [provider]  XARELTO 15 MG TABS tablet TAKE 1 TABLET (15 MG TOTAL)  BY MOUTH DAILY WITH SUPPER. Patient taking differently: Take 15 mg by mouth daily with supper.  08/22/18  Yes Deboraha Sprang, MD  amiodarone (PACERONE) 200 MG tablet Take 1 tablet (200 mg total) by mouth daily. 02/22/18   Consuelo Pandy, PA-C    Physical Exam: Vitals:   10/30/2018 1620  BP: 107/80  Pulse: 87  Resp: (!) 24  Temp: (!) 97.2 F (36.2 C)  TempSrc: Oral  SpO2: (!) 67%    Constitutional: No pallor, no diaphoresis, calm  Eyes: PERTLA, lids and conjunctivae normal ENMT: Mucous membranes are moist. Posterior pharynx clear of any exudate or lesions.   Neck: normal, supple, no masses, no thyromegaly Respiratory: Mild tachypnea. Increased WOB. Rales bilaterally. No pallor or cyanosis.  Cardiovascular: S1 & S2 heard, regular rate and rhythm. No significant LE edema. Abdomen: No distension, no tenderness, soft. Bowel sounds normal.  Musculoskeletal: no clubbing / cyanosis. No joint deformity upper and lower extremities.    Skin: no significant rashes, lesions, ulcers. Warm, dry, well-perfused. Neurologic: No facial asymmetry. Sensation intact. Strength 5/5 in all 4 limbs.  Psychiatric: Alert and oriented to person, place, and situation. Pleasant, cooperative.    Labs on Admission: I have personally reviewed following labs and imaging studies  CBC: Recent Labs  Lab 10/19/2018 1718 10/11/2018 1720  WBC  --  13.1*  HGB 11.2* 10.4*  HCT 33.0* 33.2*  MCV  --  91.0  PLT  --  833*   Basic Metabolic Panel: Recent Labs  Lab 10/11/2018 1718 10/08/2018 1720 11/05/2018 1722  NA 126* 128*  --   K 6.2* 6.5*  --   CL  --  94*  --   CO2  --  22  --   GLUCOSE  --  191*  --   BUN  --  70*  --   CREATININE  --  3.60* 3.60*  CALCIUM  --  10.9*  --    GFR: Estimated Creatinine Clearance: 14.3 mL/min (A) (by C-G formula based on SCr of 3.6 mg/dL (H)). Liver Function Tests: Recent Labs  Lab 10/16/2018 1720  AST 59*  ALT 55*  ALKPHOS 61  BILITOT 1.8*  PROT 6.9  ALBUMIN 3.6    No results for input(s): LIPASE, AMYLASE in the last 168  hours. No results for input(s): AMMONIA in the last 168 hours. Coagulation Profile: No results for input(s): INR, PROTIME in the last 168 hours. Cardiac Enzymes: No results for input(s): CKTOTAL, CKMB, CKMBINDEX, TROPONINI in the last 168 hours. BNP (last 3 results) No results for input(s): PROBNP in the last 8760 hours. HbA1C: No results for input(s): HGBA1C in the last 72 hours. CBG: No results for input(s): GLUCAP in the last 168 hours. Lipid Profile: No results for input(s): CHOL, HDL, LDLCALC, TRIG, CHOLHDL, LDLDIRECT in the last 72 hours. Thyroid Function Tests: No results for input(s): TSH, T4TOTAL, FREET4, T3FREE, THYROIDAB in the last 72 hours. Anemia Panel: No results for input(s): VITAMINB12, FOLATE, FERRITIN, TIBC, IRON, RETICCTPCT in the last 72 hours. Urine analysis:    Component Value Date/Time   COLORURINE YELLOW 03/19/2014 [place] 03/19/2014 1418   LABSPEC 1.016 03/19/2014 1418   PHURINE 6.0 03/19/2014 1418   GLUCOSEU NEGATIVE 03/19/2014 1418   HGBUR NEGATIVE 03/19/2014 1418   BILIRUBINUR negative 03/14/2017 1422   BILIRUBINUR neg 09/08/2014 1948   KETONESUR negative 03/14/2017 1422   KETONESUR NEGATIVE 03/19/2014 1418   PROTEINUR negative 03/14/2017 1422   PROTEINUR neg 09/08/2014 1948   PROTEINUR NEGATIVE 03/19/2014 1418   UROBILINOGEN 0.2 03/14/2017 1422   UROBILINOGEN 0.2 03/19/2014 1418   NITRITE Negative 03/14/2017 1422   NITRITE neg 09/08/2014 1948   NITRITE NEGATIVE 03/19/2014 1418   LEUKOCYTESUR Negative 03/14/2017 1422   Sepsis Labs: @LABRCNTIP (procalcitonin:4,lacticidven:4) ) Recent Results (from the past 240 hour(s))  SARS Coronavirus 2 (CEPHEID - Performed in [place] hospital lab), Hosp Order     Status: None   Collection Time: 10/24/2018  7:27 PM   Specimen: Nasopharyngeal Swab  Result Value Ref Range Status   SARS Coronavirus 2 NEGATIVE NEGATIVE Final     Comment: (NOTE) If result is NEGATIVE SARS-CoV-2 target nucleic acids are NOT DETECTED. The SARS-CoV-2 RNA is generally detectable in upper and lower  respiratory specimens during the acute phase of infection. The lowest  concentration of SARS-CoV-2 viral copies this assay can detect is 250  copies / mL. A negative result does not preclude SARS-CoV-2 infection  and should not be used as the sole basis for treatment or other  patient management decisions.  A negative result may occur with  improper specimen collection / handling, submission of specimen other  than nasopharyngeal swab, presence of viral mutation(s) within the  areas targeted by this assay, and inadequate number of viral copies  (<250 copies / mL). A negative result must be combined with clinical  observations, patient history, and epidemiological information. If result is POSITIVE SARS-CoV-2 target nucleic acids are DETECTED. The SARS-CoV-2 RNA is generally detectable in upper and lower  respiratory specimens dur ing the acute phase of infection.  Positive  results are indicative of active infection with SARS-CoV-2.  Clinical  correlation with patient history and other diagnostic information is  necessary to determine patient infection status.  Positive results do  not rule out bacterial infection or co-infection with other viruses. If result is PRESUMPTIVE POSTIVE SARS-CoV-2 nucleic acids MAY BE PRESENT.   A presumptive positive result was obtained on the submitted specimen  and confirmed on repeat testing.  While 2019 novel coronavirus  (SARS-CoV-2) nucleic acids may be present in the submitted sample  additional confirmatory testing may be necessary for epidemiological  and / or clinical management purposes  to differentiate between  SARS-CoV-2 and other Sarbecovirus currently known to infect humans.  If clinically  indicated additional testing with an alternate test  methodology [phone]) is advised. The SARS-CoV-2  RNA is generally  detectable in upper and lower respiratory sp ecimens during the acute  phase of infection. The expected result is Negative. Fact Sheet for Patients:  StrictlyIdeas.no Fact Sheet for Healthcare Providers: BankingDealers.co.za This test is not yet approved or cleared by the [place] FDA and has been authorized for detection and/or diagnosis of SARS-CoV-2 by FDA under an Emergency Use Authorization (EUA).  This EUA will remain in effect (meaning this test can be used) for the duration of the COVID-19 declaration under Section 564(b)(1) of the Act, 21 U.S.C. section 360bbb-3(b)(1), unless the authorization is terminated or revoked sooner. Performed at [facility], [place] [address] [place], [place] [address]      Radiological Exams on Admission: Nm Parathyroid W/spect  Result Date: 10/10/2018 CLINICAL DATA:  Hyperparathyroidism.  High PTH H EXAM: NM PARATHYROID SCINTIGRAPHY AND SPECT IMAGING TECHNIQUE: Following intravenous administration of radiopharmaceutical, early and 2-hour delayed planar images were obtained in the anterior projection. Delayed triplanar SPECT images were also obtained at 2 hours. RADIOPHARMACEUTICALS:  25.2 mCi Tc-23m Sestamibi IV COMPARISON:  None FINDINGS: On delayed 2 hour imaging there is residual activity in the RIGHT lobe of thyroid gland. SPECT imaging of the neck demonstrates focal activity in the mid RIGHT thyroid gland positioned posterior to the gland. IMPRESSION: Focal activity posterior to the mid RIGHT thyroid gland would be consistent with a parathyroid adenoma. Electronically Signed   By: Suzy Bouchard M.D.   On: 10/10/2018 14:34   Dg Chest Port 1 View  Result Date: 10/18/2018 CLINICAL DATA:  Pt is SOB today with O2 in mid 80s. Pt states he feels very weak. Seen at nephrology today and told to come straight to ED. Pt's O2 increased from 2L [place] to 6L. Pt on non-rebreathe. Hx of  CHF, HTN, cardiac arrest. EXAM: PORTABLE CHEST 1 VIEW COMPARISON:  09/02/2018 FINDINGS: There are changes from prior cardiac surgery and aortic valve replacement. Cardiac silhouette is mildly enlarged. No mediastinal or hilar masses. Left anterior chest wall AICD is stable. There is vascular congestion with bilateral interstitial thickening and small bilateral pleural effusions. Findings are similar to the prior study. No pneumothorax. Skeletal structures are grossly intact. IMPRESSION: 1. Findings consistent with congestive heart failure, similar to what was present on the prior radiographs. No convincing pneumonia. Electronically Signed   By: Lajean Manes M.D.   On: 10/11/2018 17:02    EKG: Independently reviewed. Paced rhythm.   Assessment/Plan   1. Hyperkalemia; acute kidney injury superimposed on CKD III  - Presents with generalized weakness, wt gain, and increased supplemental O2-requirement, and is found to have BUN of 70 and SCr of 3.6 (34 & 1.8 six weeks earlier) with potassium of 6.5  - Nephrology was consulted by ED physician and patient was treated with insulin/dextrose, bicarbonate, Lokelma, and Lasix in ED  - Continue cardiac monitoring, check urine chemistries, renally-dose medications, continue IV Lasix, and follow serial potassium levels   2. Acute on chronic systolic CHF; acute on chronic hypoxic respiratory failure  - Presents with generalized weakness, wt gain, and SOB, has chronic 2 Lpm supplemental O2 requirement and is found to be saturating 87% on 6 Lpm in ED - Patient has been drinking more liquids lately, has gained 4 lbs in past week, has markedly elevated BNP, and CXR findings consistent with CHF  - EF 30-35% in May 2020  - Diurese with Lasix 80  mg IV q12h, follow daily wt and I/O's, fluid-restrict, continue Coreg as tolerated, and monitor renal function and electrolytes   3. Hyponatremia  - Serum sodium is 128 on admission in setting of hypervolemia and acute on  chronic renal insufficiency   - Check urine chemistries, follow closely during diuresis   4. Atrial fibrillation  - CHADS-VASc is at least 43 (age x2, CAD, CHF) - He has been holding amiodarone at direction of his nephrologist  - Continue Xarelto and beta-blocker    5. CAD - Troponin is elevated in ED, unlikely ACS as is decreased on repeat and in absence of chest pain - Continue statin    6. Hyperbilirubinemia  - Total bilirubin is 1.8 on admission, previously normal  - No abd pain, likely congestive, diurese and trend   7. Anemia, thrombocytopenia  - Counts appear stable, no bleeding     PPE: Mask, face shield  DVT prophylaxis: Xarelto  Code Status: Full, confirmed with patient  Family Communication: Wife updated at bedside Consults called: Nephrology and cardiology consulted by ED physician  Admission status: Inpatient    Vianne Bulls, MD [facility] Pager [phone]  If 7PM-7AM, please contact night-coverage [email or web address] Password [facility]  10/19/2018, 8:57 PM

## 2018-10-20 NOTE — ED Notes (Signed)
ED TO INPATIENT HANDOFF REPORT  ED Nurse Name and Phone #: [phone]  S Name/Age/Gender [name] 85 y.o. male Room/Bed: 026C/026C  Code Status   Code Status: Prior  Home/SNF/Other Home Patient oriented to: self, place, time and situation Is this baseline? Yes   Triage Complete: Triage complete  Chief Complaint Gen weakness  Triage Note To ED for eval of low sats - increased O2 consumption. Pt states he feels very weak. Seen at nephrology today and told to come straight to ED. Pt's O2 increased from 2L [place] to 6L [place] pta today. Wife states pt was seen by dr Caryl Comes for a change in his defib settings. Told 'he looses his rhythm 6% of the time'. Pt is talking and appears very weak. Pt denies cp   Allergies Allergies  Allergen Reactions  . Cyclobenzaprine Hcl Rash  . Tetracycline Rash         Level of Care/Admitting Diagnosis ED Disposition    ED Disposition Condition [facility]: [place] [[id number]]  Level of Care: Progressive [102]  Covid Evaluation: Asymptomatic Screening Protocol (No Symptoms)  Diagnosis: Hyperkalemia [287867]  Admitting Physician: Vianne Bulls [[id number]]  Attending Physician: Vianne Bulls [[id number]]  Estimated length of stay: past midnight tomorrow  Certification:: I certify this patient will need inpatient services for at least 2 midnights  PT Class (Do Not Modify): Inpatient [101]  PT Acc Code (Do Not Modify): Private [1]       B Medical/Surgery History Past Medical History:  Diagnosis Date  . Aortic stenosis, severe    s/p TAVR with Edwards Sapien 3 THV (size 26 mm, model # 9600TFX, serial # [id number])  . Arthritis   . Cardiac arrest ([place])   . Chronic systolic CHF (congestive heart failure) ([place]) 01/21/2018  . Coronary artery disease    a. s/p CABG in 1982 b. redo CABG in 1994 c. cath in 2015 showing occluded left main and RCA, patent LIMA to LAD, patent SVG to OM, patent SVG to RCA d. stable  anatomy by repeat cath in 01/2018  . Essential hypertension   . Headache   . History of hiatal hernia   . Hyperlipidemia   . Kidney stones   . OSA (obstructive sleep apnea) 02/05/2018   Severe obstructive sleep apnea with an AHI of 36.8/h with no significant central sleep apnea.   Now on CPAP at  5 cm H2O  . Peripheral vascular disease ([place])   . S/P cardiac cath 01/22/18  01/23/2018   Past Surgical History:  Procedure Laterality Date  . ADENOIDECTOMY    . APPENDECTOMY    . CARDIAC CATHETERIZATION   11/16/2009   . CARDIAC DEFIBRILLATOR PLACEMENT    . CARDIOVERSION N/A 02/24/2018   Procedure: CARDIOVERSION;  Surgeon: Sueanne Margarita, MD;  Location: [facility] ENDOSCOPY;  Service: Cardiovascular;  Laterality: N/A;  . CARDIOVERSION N/A 08/21/2018   Procedure: CARDIOVERSION;  Surgeon: Skeet Latch, MD;  Location: [facility] ENDOSCOPY;  Service: Cardiovascular;  Laterality: N/A;  . cataract surgery      Bilateral cataract surgery  . CORONARY ARTERY BYPASS GRAFT     in 1982 and '84 with subsequent PTCI  . EP IMPLANTABLE DEVICE N/A 03/21/2016   Procedure: ICD Generator Changeout;  Surgeon: Deboraha Sprang, MD;  Location: [facility] CV LAB;  Service: Cardiovascular;  Laterality: N/A;  . HERNIA REPAIR    . LEFT AND RIGHT HEART CATHETERIZATION WITH CORONARY/GRAFT ANGIOGRAM N/A 02/01/2014   Procedure:  LEFT AND RIGHT HEART CATHETERIZATION WITH Beatrix Fetters;  Surgeon: Sinclair Grooms, MD;  Location: [facility] CATH LAB;  Service: Cardiovascular;  Laterality: N/A;  . left elbow tendon surgery    . LEFT HEART CATH AND CORS/GRAFTS ANGIOGRAPHY N/A 01/22/2018   Procedure: LEFT HEART CATH AND CORS/GRAFTS ANGIOGRAPHY;  Surgeon: Leonie Man, MD;  Location: [facility] CV LAB;  Service: Cardiovascular;  Laterality: N/A;  . TEE WITHOUT CARDIOVERSION N/A 03/23/2014   Procedure: TRANSESOPHAGEAL ECHOCARDIOGRAM (TEE);  Surgeon: Burnell Blanks, MD;  Location: [facility];  Service: Open Heart Surgery;   Laterality: N/A;  . TONSILLECTOMY    . TRANSCATHETER AORTIC VALVE REPLACEMENT, TRANSFEMORAL N/A 03/23/2014   Procedure: TRANSCATHETER AORTIC VALVE REPLACEMENT, TRANSFEMORAL;  Surgeon: Burnell Blanks, MD;  Location: [place];  Service: Open Heart Surgery;  Laterality: N/A;     A IV Location/Drains/Wounds Patient Lines/Drains/Airways Status   Active Line/Drains/Airways    Name:   Placement date:   Placement time:   Site:   Days:   Peripheral IV 11/01/2018 Right Antecubital   11/01/2018    1715    Antecubital   less than 1          Intake/Output Last 24 hours No intake or output data in the 24 hours ending 10/28/2018 2047  Labs/Imaging Results for orders placed or performed during the hospital encounter of 10/16/2018 (from the past 48 hour(s))  I-STAT 7, (LYTES, BLD GAS, ICA, H+H)     Status: Abnormal   Collection Time: 10/23/2018  5:18 PM  Result Value Ref Range   pH, Arterial 7.437 7.350 - 7.450   pCO2 arterial 30.4 (L) 32.0 - 48.0 mmHg   pO2, Arterial 43.0 (L) 83.0 - 108.0 mmHg   Bicarbonate 20.5 20.0 - 28.0 mmol/L   TCO2 21 (L) 22 - 32 mmol/L   O2 Saturation 81.0 %   Acid-base deficit 3.0 (H) 0.0 - 2.0 mmol/L   Sodium 126 (L) 135 - 145 mmol/L   Potassium 6.2 (H) 3.5 - 5.1 mmol/L   Calcium, Ion 1.38 1.15 - 1.40 mmol/L   HCT 33.0 (L) 39.0 - 52.0 %   Hemoglobin 11.2 (L) 13.0 - 17.0 g/dL   Patient temperature HIDE    Sample type ARTERIAL   CBC     Status: Abnormal   Collection Time: 10/09/2018  5:20 PM  Result Value Ref Range   WBC 13.1 (H) 4.0 - 10.5 K/uL   RBC 3.65 (L) 4.22 - 5.81 MIL/uL   Hemoglobin 10.4 (L) 13.0 - 17.0 g/dL   HCT 33.2 (L) 39.0 - 52.0 %   MCV 91.0 80.0 - 100.0 fL   MCH 28.5 26.0 - 34.0 pg   MCHC 31.3 30.0 - 36.0 g/dL   RDW 25.7 (H) 11.5 - 15.5 %   Platelets 141 (L) 150 - 400 K/uL   nRBC 0.2 0.0 - 0.2 %    Comment: Performed at [facility], [address], [place], [place] [address]  Comprehensive metabolic panel     Status: Abnormal   Collection  Time: 11/06/2018  5:20 PM  Result Value Ref Range   Sodium 128 (L) 135 - 145 mmol/L   Potassium 6.5 (HH) 3.5 - 5.1 mmol/L    Comment: NO VISIBLE HEMOLYSIS CRITICAL RESULT CALLED TO, READ BACK BY AND VERIFIED WITH:  T HAMILTON,RN 1827 11/06/2018 WBOND    Chloride 94 (L) 98 - 111 mmol/L   CO2 22 22 - 32 mmol/L   Glucose, Bld 191 (H)  70 - 99 mg/dL   BUN 70 (H) 8 - 23 mg/dL   Creatinine, Ser 3.60 (H) 0.61 - 1.24 mg/dL   Calcium 10.9 (H) 8.9 - 10.3 mg/dL   Total Protein 6.9 6.5 - 8.1 g/dL   Albumin 3.6 3.5 - 5.0 g/dL   AST 59 (H) 15 - 41 U/L   ALT 55 (H) 0 - 44 U/L   Alkaline Phosphatase 61 38 - 126 U/L   Total Bilirubin 1.8 (H) 0.3 - 1.2 mg/dL   GFR calc non Af Amer 15 (L) >60 mL/min   GFR calc Af Amer 17 (L) >60 mL/min   Anion gap 12 5 - 15    Comment: Performed at [facility] [address] [place], [place] [address]  Troponin I (High Sensitivity)     Status: Abnormal   Collection Time: 11/06/2018  5:20 PM  Result Value Ref Range   Troponin I (High Sensitivity) 213 (HH) <18 ng/L    Comment: CRITICAL RESULT CALLED TO, READ BACK BY AND VERIFIED WITH:  T HAMILTON,RN 1827 10/19/2018 WBOND (NOTE) Elevated high sensitivity troponin I (hsTnI) values and significant  changes across serial measurements may suggest ACS but many other  chronic and acute conditions are known to elevate hsTnI results.  Refer to the Links section for chest pain algorithms and additional  guidance. Performed at [facility], [place] [address] [place], [place] [address]   Brain natriuretic peptide     Status: Abnormal   Collection Time: 10/12/2018  5:20 PM  Result Value Ref Range   B Natriuretic Peptide 3,312.4 (H) 0.0 - 100.0 pg/mL    Comment: Performed at [facility] [address], [place], [place] [address]  I-Stat Creatinine, ED (not at [facility])     Status: Abnormal   Collection Time: 11/02/2018  5:22 PM  Result Value Ref Range   Creatinine, Ser 3.60 (H) 0.61 - 1.24 mg/dL  Troponin I (High  Sensitivity)     Status: Abnormal   Collection Time: 11/05/2018  7:22 PM  Result Value Ref Range   Troponin I (High Sensitivity) 207 (HH) <18 ng/L    Comment: CRITICAL VALUE NOTED.  VALUE IS CONSISTENT WITH PREVIOUSLY REPORTED AND CALLED VALUE. (NOTE) Elevated high sensitivity troponin I (hsTnI) values and significant  changes across serial measurements may suggest ACS but many other  chronic and acute conditions are known to elevate hsTnI results.  Refer to the Links section for chest pain algorithms and additional  guidance. Performed at [facility], [place] [address] [place], [place] [address]    Nm Parathyroid W/spect  Result Date: 10/08/2018 CLINICAL DATA:  Hyperparathyroidism.  High PTH H EXAM: NM PARATHYROID SCINTIGRAPHY AND SPECT IMAGING TECHNIQUE: Following intravenous administration of radiopharmaceutical, early and 2-hour delayed planar images were obtained in the anterior projection. Delayed triplanar SPECT images were also obtained at 2 hours. RADIOPHARMACEUTICALS:  25.2 mCi Tc-42m Sestamibi IV COMPARISON:  None FINDINGS: On delayed 2 hour imaging there is residual activity in the RIGHT lobe of thyroid gland. SPECT imaging of the neck demonstrates focal activity in the mid RIGHT thyroid gland positioned posterior to the gland. IMPRESSION: Focal activity posterior to the mid RIGHT thyroid gland would be consistent with a parathyroid adenoma. Electronically Signed   By: Suzy Bouchard M.D.   On: 10/30/2018 14:34   Dg Chest Port 1 View  Result Date: 10/17/2018 CLINICAL DATA:  Pt is SOB today with O2 in mid 80s. Pt states he  feels very weak. Seen at nephrology today and told to come straight to ED. Pt's O2 increased from 2L [place] to 6L. Pt on non-rebreathe. Hx of CHF, HTN, cardiac arrest. EXAM: PORTABLE CHEST 1 VIEW COMPARISON:  09/02/2018 FINDINGS: There are changes from prior cardiac surgery and aortic valve replacement. Cardiac silhouette is mildly enlarged. No mediastinal or hilar  masses. Left anterior chest wall AICD is stable. There is vascular congestion with bilateral interstitial thickening and small bilateral pleural effusions. Findings are similar to the prior study. No pneumothorax. Skeletal structures are grossly intact. IMPRESSION: 1. Findings consistent with congestive heart failure, similar to what was present on the prior radiographs. No convincing pneumonia. Electronically Signed   By: Lajean Manes M.D.   On: 11/07/2018 17:02    Pending Labs Unresulted Labs (From admission, onward)    Start     Ordered   11/04/2018 1630  Blood gas, arterial  Once,   STAT     10/24/2018 1629   10/19/2018 1623  SARS Coronavirus 2 (CEPHEID - Performed in [facility] hospital lab), Hosp Order  (Asymptomatic Patients Labs)  Once,   STAT    Question:  Rule Out  Answer:  Yes   10/12/2018 1622   10/14/2018 1619  Urinalysis, Routine w reflex microscopic  Once,   STAT     10/11/2018 1619          Vitals/Pain Today's Vitals   10/13/2018 1618 10/12/2018 1620 10/13/2018 1941  BP:  107/80   Pulse:  87   Resp:  (!) 24   Temp:  (!) 97.2 F (36.2 C)   TempSrc:  Oral   SpO2:  (!) 67%   PainSc: 0-No pain 0-No pain 0-No pain    Isolation Precautions No active isolations  Medications Medications  sodium chloride flush (NS) 0.9 % injection 3 mL (3 mLs Intravenous Given 10/30/2018 1724)  furosemide (LASIX) injection 40 mg (40 mg Intravenous Given 11/06/2018 1854)  sodium bicarbonate injection 50 mEq (50 mEq Intravenous Given 10/30/2018 1907)  sodium zirconium cyclosilicate (LOKELMA) packet 10 g (10 g Oral Given 10/29/2018 1923)  insulin aspart (novoLOG) injection 5 Units (5 Units Subcutaneous Given 10/08/2018 1919)  dextrose 50 % solution 50 mL (50 mLs Intravenous Given 10/26/2018 1914)    Mobility walks     Focused Assessments    R Recommendations: See Admitting Provider Note  Report given to:   Additional Notes:

## 2018-10-20 NOTE — ED Triage Notes (Signed)
To ED for eval of low sats - increased O2 consumption. Pt states he feels very weak. Seen at nephrology today and told to come straight to ED. Pt's O2 increased from 2L [place] to 6L [place] pta today. Wife states pt was seen by dr Caryl Comes for a change in his defib settings. Told 'he looses his rhythm 6% of the time'. Pt is talking and appears very weak. Pt denies cp

## 2018-10-20 NOTE — ED Provider Notes (Signed)
[name]    History   Chief Complaint Chief Complaint  Patient presents with  . Shortness of Breath  . Weakness    HPI IZICK GASBARRO is a 85 y.o. male history of severe aortic stenosis, CHF, CAD s/p CABG, here presenting with shortness of breath, hypoxia.  Patient states that he has been gaining weight for the last several days.  Gained about 5 pounds over the last several days.  He is on 3 L nasal cannula at baseline.  He went to nephrology office for worsening shortness of breath.  He has labs done outpatient that showed elevated calcium level as well as creatinine.  He was noted to be hypoxic to 67% on his baseline 3 L so sent here for evaluation.  Denies any fever or cough.  Patient states that he stays at home all the time and had no known contacts with COVID.      The history is provided by the patient.    Past Medical History:  Diagnosis Date  . Aortic stenosis, severe    s/p TAVR with Edwards Sapien 3 THV (size 26 mm, model # 9600TFX, serial # [id number])  . Arthritis   . Cardiac arrest ([place])   . Chronic systolic CHF (congestive heart failure) ([place]) 01/21/2018  . Coronary artery disease    a. s/p CABG in 1982 b. redo CABG in 1994 c. cath in 2015 showing occluded left main and RCA, patent LIMA to LAD, patent SVG to OM, patent SVG to RCA d. stable anatomy by repeat cath in 01/2018  . Essential hypertension   . Headache   . History of hiatal hernia   . Hyperlipidemia   . Kidney stones   . OSA (obstructive sleep apnea) 02/05/2018   Severe obstructive sleep apnea with an AHI of 36.8/h with no significant central sleep apnea.   Now on CPAP at  5 cm H2O  . Peripheral vascular disease ([place])   . S/P cardiac cath 01/22/18  01/23/2018    Patient Active Problem List   Diagnosis Date Noted  . CAD (coronary artery disease) 09/15/2018  . S/P TAVR (transcatheter aortic  valve replacement)   . Demand ischemia ([place])   . Respiratory failure with hypoxia ([place]) 08/18/2018  . Cardiac arrest ([place])   . Acute on chronic combined systolic (congestive) and diastolic (congestive) heart failure ([place]) 08/11/2018  . Essential hypertension 08/09/2018  . CKD (chronic kidney disease) stage 3, GFR 30-59 ml/min (H[facility]) 08/09/2018  . Constipation 03/03/2018  . Anemia 03/03/2018  . OSA (obstructive sleep apnea) 02/05/2018  . S/P cardiac cath 01/22/18 stable -treating medically  01/23/2018  . Atrial tachycardia ([place]) 01/22/2018  . Non-ST elevation (NSTEMI) myocardial infarction ([place]) 01/21/2018  . Chronic systolic CHF (congestive heart failure) ([place]) 01/21/2018  . Restless leg syndrome 07/29/2015  . Severe aortic valve stenosis - s/p  Edwards Sapien 3 THV (size 26 mm, model # 9600TFX, serial # [id number]) 03/23/2014 03/23/2014  . Ischemic cardiomyopathy 21-Dec-2011  . Sudden cardiac death-aborted 10-14-2010  . Back pain 07/10/2010  . Hypercholesteremia 07/10/2010  . Benign hypertensive heart disease without heart failure 07/10/2010  . S/P CABG (coronary artery bypass graft) 1982 and re-do 1994  07/10/2010  . Implantable cardioverter-defibrillator (ICD) in situ 03/30/2010  . Atrial fibrillation ([place]) 05-25-2009    Past Surgical History:  Procedure Laterality Date  . ADENOIDECTOMY    . APPENDECTOMY    .  CARDIAC CATHETERIZATION   11/16/2009   . CARDIAC DEFIBRILLATOR PLACEMENT    . CARDIOVERSION N/A 02/24/2018   Procedure: CARDIOVERSION;  Surgeon: Sueanne Margarita, MD;  Location[facility] ENDOSCOPY;  Service: Cardiovascular;  Laterality: N/A;  . CARDIOVERSION N/A 08/21/2018   Procedure: CARDIOVERSION;  Surgeon: Skeet Latch, MD;  Location: [facility] ENDOSCOPY;  Service: Cardiovascular;  Laterality: N/A;  . cataract surgery      Bilateral cataract surgery  . CORONARY ARTERY BYPASS GRAFT     in 1982 and '84 with subsequent PTCI  . EP IMPLANTABLE DEVICE N/A 03/21/2016   Procedure: ICD  Generator Changeout;  Surgeon: Deboraha Sprang, MD;  Location: [facility] CV LAB;  Service: Cardiovascular;  Laterality: N/A;  . HERNIA REPAIR    . LEFT AND RIGHT HEART CATHETERIZATION WITH CORONARY/GRAFT ANGIOGRAM N/A 02/01/2014   Procedure: LEFT AND RIGHT HEART CATHETERIZATION WITH Beatrix Fetters;  Surgeon: Sinclair Grooms, MD;  Location: [facility] CATH LAB;  Service: Cardiovascular;  Laterality: N/A;  . left elbow tendon surgery    . LEFT HEART CATH AND CORS/GRAFTS ANGIOGRAPHY N/A 01/22/2018   Procedure: LEFT HEART CATH AND CORS/GRAFTS ANGIOGRAPHY;  Surgeon: Leonie Man, MD;  Location: [facility] CV LAB;  Service: Cardiovascular;  Laterality: N/A;  . TEE WITHOUT CARDIOVERSION N/A 03/23/2014   Procedure: TRANSESOPHAGEAL ECHOCARDIOGRAM (TEE);  Surgeon: Burnell Blanks, MD;  Location: [facility];  Service: Open Heart Surgery;  Laterality: N/A;  . TONSILLECTOMY    . TRANSCATHETER AORTIC VALVE REPLACEMENT, TRANSFEMORAL N/A 03/23/2014   Procedure: TRANSCATHETER AORTIC VALVE REPLACEMENT, TRANSFEMORAL;  Surgeon: Burnell Blanks, MD;  Location: [facility];  Service: Open Heart Surgery;  Laterality: N/A;        Home Medications    Prior to Admission medications   Medication Sig Start Date End Date Taking? Authorizing Provider  acetaminophen (TYLENOL) 500 MG tablet Take 500 mg by mouth every 6 (six) hours as needed for moderate pain or headache.    [provider]  amiodarone (PACERONE) 200 MG tablet Take 1 tablet (200 mg total) by mouth daily. 02/22/18   Lyda Jester M, PA-C  amLODipine (NORVASC) 5 MG tablet TAKE 1 TABLET BY MOUTH DAILY. Patient taking differently: Take 5 mg by mouth daily.  12/23/17   Burnell Blanks, MD  b complex vitamins capsule Take 1 capsule by mouth daily. 09/02/18   Binnie Rail, MD  carvedilol (COREG) 6.25 MG tablet Take 1 tablet (6.25 mg total) by mouth 2 (two) times daily with a meal for 30 days. 09/22/18 11-09-18  Binnie Rail, MD   Coenzyme Q10 (COQ10) 100 MG CAPS Take 100 mg by mouth at bedtime.    [provider]  diazepam (VALIUM) 5 MG tablet TAKE 1 TABLET BY MOUTH ONCE DAILY AS NEEDED 05/19/18   Burnell Blanks, MD  ferrous sulfate 325 (65 FE) MG tablet Take 1 tablet (325 mg total) by mouth daily with breakfast. 08/10/18   Burns, Claudina Lick, MD  furosemide (LASIX) 80 MG tablet Take 1 tablet (80 mg total) by mouth 2 (two) times daily. 09/06/18   Strader, Fransisco Hertz, PA-C  Krill Oil 500 MG CAPS Take 500 mg by mouth at bedtime.    [provider]  Magnesium 400 MG CAPS Take 400 mg by mouth at bedtime.    [provider]  Melatonin 1 MG TABS Take 3 mg by mouth at bedtime as needed (sleep).     [provider]  Multiple Vitamin (MULTIVITAMIN WITH MINERALS)  TABS tablet Take 1 tablet by mouth daily.    [provider]  nitroGLYCERIN (NITROSTAT) 0.4 MG SL tablet Place 1 tablet (0.4 mg total) under the tongue every 5 (five) minutes as needed for chest pain (for chest pain (MAX of 3 doses)). 04/12/15   Burnell Blanks, MD  potassium chloride SA (K-DUR) 20 MEQ tablet Take 1 tablet (20 mEq total) by mouth daily. 09/06/18   Strader, Fransisco Hertz, PA-C  Propylene Glycol (SYSTANE COMPLETE) 0.6 % SOLN Place 1 drop into both eyes daily as needed (irritation).    [provider]  rosuvastatin (CRESTOR) 40 MG tablet TAKE 1 TABLET BY MOUTH DAILY. 12/23/17   Burnell Blanks, MD  senna (SENOKOT) 8.6 MG TABS tablet Take 1 tablet by mouth daily as needed for mild constipation.    [provider]  trospium (SANCTURA) 20 MG tablet Take 20 mg by mouth 2 (two) times daily.    [provider]  XARELTO 15 MG TABS tablet TAKE 1 TABLET (15 MG TOTAL) BY MOUTH DAILY WITH SUPPER. Patient taking differently: Take 15 mg by mouth daily with supper.  08/22/18   Deboraha Sprang, MD    Family History Family History  Problem Relation Age of Onset  . Heart attack Father 32  .  Coronary artery disease Other        family history is dignificant for early CAD in several members    Social History Social History   Tobacco Use  . Smoking status: Former Smoker    Quit date: 10/25/1952    Years since quitting: 66.0  . Smokeless tobacco: Never Used  Substance Use Topics  . Alcohol use: Yes    Alcohol/week: 5.0 - 6.0 standard drinks    Types: 5 - 6 Glasses of wine per week    Comment: 5-6 times a week  . Drug use: No     Allergies   Cyclobenzaprine hcl and Tetracycline   Review of Systems Review of Systems  Respiratory: Positive for shortness of breath.   Neurological: Positive for weakness.  All other systems reviewed and are negative.    Physical Exam Updated Vital Signs BP 107/80   Pulse 87   Temp (!) 97.2 F (36.2 C) (Oral)   Resp (!) 24   SpO2 (!) 67%   Physical Exam Vitals signs reviewed.  Constitutional:      Comments: Chronically ill, tachypneic   HENT:     Head: Normocephalic.  Eyes:     Pupils: Pupils are equal, round, and reactive to light.  Neck:     Musculoskeletal: Normal range of motion.  Cardiovascular:     Rate and Rhythm: Regular rhythm.     Comments: +systolic murmur  Pulmonary:     Comments: Crackles bilateral bases  Abdominal:     Palpations: Abdomen is soft.  Musculoskeletal: Normal range of motion.     Comments: 1+ edema bilaterally   Skin:    General: Skin is warm.     Capillary Refill: Capillary refill takes less than 2 seconds.  Neurological:     General: No focal deficit present.     Mental Status: He is alert and oriented to person, place, and time.  Psychiatric:        Mood and Affect: Mood normal.        Behavior: Behavior normal.      ED Treatments / Results  Labs (all labs ordered are listed, but only abnormal results are displayed) Labs Reviewed  CBC - Abnormal; Notable for the following components:      Result Value   WBC 13.1 (*)    RBC 3.65 (*)    Hemoglobin 10.4 (*)    HCT 33.2 (*)     RDW 25.7 (*)    Platelets 141 (*)    All other components within normal limits  COMPREHENSIVE METABOLIC PANEL - Abnormal; Notable for the following components:   Sodium 128 (*)    Potassium 6.5 (*)    Chloride 94 (*)    Glucose, Bld 191 (*)    BUN 70 (*)    Creatinine, Ser 3.60 (*)    Calcium 10.9 (*)    AST 59 (*)    ALT 55 (*)    Total Bilirubin 1.8 (*)    GFR calc non Af Amer 15 (*)    GFR calc Af Amer 17 (*)    All other components within normal limits  BRAIN NATRIURETIC PEPTIDE - Abnormal; Notable for the following components:   B Natriuretic Peptide 3,312.4 (*)    All other components within normal limits  I-STAT CREATININE, ED - Abnormal; Notable for the following components:   Creatinine, Ser 3.60 (*)    All other components within normal limits  POCT I-STAT 7, (LYTES, BLD GAS, ICA,H+H) - Abnormal; Notable for the following components:   pCO2 arterial 30.4 (*)    pO2, Arterial 43.0 (*)    TCO2 21 (*)    Acid-base deficit 3.0 (*)    Sodium 126 (*)    Potassium 6.2 (*)    HCT 33.0 (*)    Hemoglobin 11.2 (*)    All other components within normal limits  TROPONIN I (HIGH SENSITIVITY) - Abnormal; Notable for the following components:   Troponin I (High Sensitivity) 213 (*)    All other components within normal limits  SARS CORONAVIRUS 2 (HOSPITAL ORDER, [facility])  URINALYSIS, ROUTINE W REFLEX MICROSCOPIC  BLOOD GAS, ARTERIAL  CBG MONITORING, ED  TROPONIN I (HIGH SENSITIVITY)    EKG None   ED ECG REPORT   Date: 10/28/2018  Rate: 61  Rhythm: paced  QRS Axis: normal  Intervals: normal  ST/T Wave abnormalities: nonspecific ST changes  Conduction Disutrbances:none  Narrative Interpretation:   Old EKG Reviewed: changes noted  I have personally reviewed the EKG tracing and agree with the computerized printout as noted.   Radiology Nm Parathyroid W/spect  Result Date: 10/25/2018 CLINICAL DATA:  Hyperparathyroidism.  High PTH  H EXAM: NM PARATHYROID SCINTIGRAPHY AND SPECT IMAGING TECHNIQUE: Following intravenous administration of radiopharmaceutical, early and 2-hour delayed planar images were obtained in the anterior projection. Delayed triplanar SPECT images were also obtained at 2 hours. RADIOPHARMACEUTICALS:  25.2 mCi Tc-86m Sestamibi IV COMPARISON:  None FINDINGS: On delayed 2 hour imaging there is residual activity in the RIGHT lobe of thyroid gland. SPECT imaging of the neck demonstrates focal activity in the mid RIGHT thyroid gland positioned posterior to the gland. IMPRESSION: Focal activity posterior to the mid RIGHT thyroid gland would be consistent with a parathyroid adenoma. Electronically Signed   By: Suzy Bouchard M.D.   On: 10/15/2018 14:34   Dg Chest Port 1 View  Result Date: 10/17/2018 CLINICAL DATA:  Pt is SOB today with O2 in mid 80s. Pt states he feels very weak. Seen at nephrology today and told to come straight to ED. Pt's O2 increased from 2L [place] to 6L. Pt on non-rebreathe. Hx of CHF, HTN, cardiac arrest. EXAM:  PORTABLE CHEST 1 VIEW COMPARISON:  09/02/2018 FINDINGS: There are changes from prior cardiac surgery and aortic valve replacement. Cardiac silhouette is mildly enlarged. No mediastinal or hilar masses. Left anterior chest wall AICD is stable. There is vascular congestion with bilateral interstitial thickening and small bilateral pleural effusions. Findings are similar to the prior study. No pneumothorax. Skeletal structures are grossly intact. IMPRESSION: 1. Findings consistent with congestive heart failure, similar to what was present on the prior radiographs. No convincing pneumonia. Electronically Signed   By: Lajean Manes M.D.   On: 10/12/2018 17:02    Procedures Procedures (including critical care time)  CRITICAL CARE Performed by: Wandra Arthurs   Total critical care time: 30 minutes  Critical care time was exclusive of separately billable procedures and treating other patients.   Critical care was necessary to treat or prevent imminent or life-threatening deterioration.  Critical care was time spent personally by me on the following activities: development of treatment plan with patient and/or surrogate as well as nursing, discussions with consultants, evaluation of patient's response to treatment, examination of patient, obtaining history from patient or surrogate, ordering and performing treatments and interventions, ordering and review of laboratory studies, ordering and review of radiographic studies, pulse oximetry and re-evaluation of patient's condition.  Angiocath insertion Performed by: Wandra Arthurs  Consent: Verbal consent obtained. Risks and benefits: risks, benefits and alternatives were discussed Time out: Immediately prior to procedure a "time out" was called to verify the correct patient, procedure, equipment, support staff and site/side marked as required.  Preparation: Patient was prepped and draped in the usual sterile fashion.  Vein Location: R antecube  Ultrasound Guided  Gauge: 20 long   Normal blood return and flush without difficulty Patient tolerance: Patient tolerated the procedure well with no immediate complications.     Medications Ordered in ED Medications  sodium zirconium cyclosilicate (LOKELMA) packet 10 g (has no administration in time range)  insulin aspart (novoLOG) injection 5 Units (has no administration in time range)  dextrose 50 % solution 50 mL (has no administration in time range)  sodium chloride flush (NS) 0.9 % injection 3 mL (3 mLs Intravenous Given 10/17/2018 1724)  furosemide (LASIX) injection 40 mg (40 mg Intravenous Given 10/24/2018 1854)  sodium bicarbonate injection 50 mEq (50 mEq Intravenous Given 11/06/2018 1907)     Initial Impression / Assessment and Plan / ED Course  I have reviewed the triage vital signs and the nursing notes.  Pertinent labs & imaging results that were available during my care of the  patient were reviewed by me and considered in my medical decision making (see chart for details).        JARICK HARKINS is a 85 y.o. male history of CAD status post CABG, CHF here presenting with shortness of breath, hypoxia, renal failure.  It is unclear why he requires so much oxygen now.  Consider CHF exacerbation versus worsening renal failure versus COVID.  He is afebrile has no known cover contacts.  Even with her oxygen of 67%, patient is talking and alert and oriented.  Will get labs, ABG, CXR.   7:13 PM Labs showed reassuring VBG.  However, he has worsening renal failure with a creatinine of 3.6, he also has hyperkalemia with potassium of 6.5.  He has a baseline paced rhythm that was wide-complex already and its unclear if it is changed from previous.  I talked to Dr. Johnney Ou from nephrology. She agreed with bicarb, insulin, lokelma. Patient has no  access for dialysis. Patient also has been urinating so will give lasix. Will consult cardiology.   8:11 PM Dr. Myna Hidalgo to admit. Talked to Dr. Haroldine Laws, who states that medicine can admit and consult as needed as patient has multi system failure.    Final Clinical Impressions(s) / ED Diagnoses   Final diagnoses:  None    ED Discharge Orders    None       Drenda Freeze, MD 10/12/2018 2012

## 2018-10-20 NOTE — Consult Note (Addendum)
[name]  INPATIENT CONSULTATION  Reason for Consultation: AKI on CKD, hyperkalemia, volume overload Requesting Provider: Dr. Darl Householder  HPI: [name] is an 85 y.o. male with CAD s/p CABG, HFrEF (08/2018 EF 30-35% TTE), AS s/p TAVR, HTN, OSA, PVD and CKD who is seen for evaluation and management of AKI on CKD, hyperkalemia and volume overload.   Pt presented for nephrology clinic visit today and was promptly dispatched to ED due to desaturations on home O2.  He was recently hospitalized about 6 weeks ago for volume overload and was doing well at home until the past few days when he developed some wt gain and inc O2 requirement.  He also had generalized weakness.  Appetite has been poor and he reports dysgeusia, replacing food with liquid intake lately.    In the ED he was requiring 6L [place] to maintain sats initially.  CXR with pulmonary edema.  Na 128, K 6.5, BUN 70, Cr 3.60.  Cr was 1.8 during last admission.  He was given insulin, lasix, lokelma and bicarb in ED.  Prior to transfer out of ED, noted to have increased hypoxia and placed on Bipap with improvement.  Pt currently feeling better on bipap, doesn't have any acute complaint currently.    PMH: Past Medical History:  Diagnosis Date  . Aortic stenosis, severe    s/p TAVR with Edwards Sapien 3 THV (size 26 mm, model # 9600TFX, serial # [id number])  . Arthritis   . Cardiac arrest ([place])   . Chronic systolic CHF (congestive heart failure) ([place]) 01/21/2018  . Coronary artery disease    a. s/p CABG in 1982 b. redo CABG in 1994 c. cath in 2015 showing occluded left main and RCA, patent LIMA to LAD, patent SVG to OM, patent SVG to RCA d. stable anatomy by repeat cath in 01/2018  . Essential hypertension   . Headache   . History of hiatal hernia   . Hyperlipidemia   . Kidney stones   . OSA (obstructive sleep apnea) 02/05/2018   Severe obstructive sleep apnea with an AHI of 36.8/h with no significant central sleep apnea.   Now on  CPAP at  5 cm H2O  . Peripheral vascular disease ([place])   . S/P cardiac cath 01/22/18  01/23/2018   PSH: Past Surgical History:  Procedure Laterality Date  . ADENOIDECTOMY    . APPENDECTOMY    . CARDIAC CATHETERIZATION   11/16/2009   . CARDIAC DEFIBRILLATOR PLACEMENT    . CARDIOVERSION N/A 02/24/2018   Procedure: CARDIOVERSION;  Surgeon: Sueanne Margarita, MD;  Location: [facility] ENDOSCOPY;  Service: Cardiovascular;  Laterality: N/A;  . CARDIOVERSION N/A 08/21/2018   Procedure: CARDIOVERSION;  Surgeon: Skeet Latch, MD;  Location: [facility] ENDOSCOPY;  Service: Cardiovascular;  Laterality: N/A;  . cataract surgery      Bilateral cataract surgery  . CORONARY ARTERY BYPASS GRAFT     in 1982 and '84 with subsequent PTCI  . EP IMPLANTABLE DEVICE N/A 03/21/2016   Procedure: ICD Generator Changeout;  Surgeon: Deboraha Sprang, MD;  Location: [place] [facility] CV LAB;  Service: Cardiovascular;  Laterality: N/A;  . HERNIA REPAIR    . LEFT AND RIGHT HEART CATHETERIZATION WITH CORONARY/GRAFT ANGIOGRAM N/A 02/01/2014   Procedure: LEFT AND RIGHT HEART CATHETERIZATION WITH Beatrix Fetters;  Surgeon: Sinclair Grooms, MD;  Location: [facility] CATH LAB;  Service: Cardiovascular;  Laterality: N/A;  . left elbow tendon surgery    . LEFT HEART CATH AND CORS/GRAFTS ANGIOGRAPHY N/A  01/22/2018   Procedure: LEFT HEART CATH AND CORS/GRAFTS ANGIOGRAPHY;  Surgeon: Leonie Man, MD;  Location: [facility] CV LAB;  Service: Cardiovascular;  Laterality: N/A;  . TEE WITHOUT CARDIOVERSION N/A 03/23/2014   Procedure: TRANSESOPHAGEAL ECHOCARDIOGRAM (TEE);  Surgeon: Burnell Blanks, MD;  Location: [facility];  Service: Open Heart Surgery;  Laterality: N/A;  . TONSILLECTOMY    . TRANSCATHETER AORTIC VALVE REPLACEMENT, TRANSFEMORAL N/A 03/23/2014   Procedure: TRANSCATHETER AORTIC VALVE REPLACEMENT, TRANSFEMORAL;  Surgeon: Burnell Blanks, MD;  Location: [facility];  Service: Open Heart Surgery;  Laterality: N/A;    Past  Medical History:  Diagnosis Date  . Aortic stenosis, severe    s/p TAVR with Edwards Sapien 3 THV (size 26 mm, model # 9600TFX, serial # [id number])  . Arthritis   . Cardiac arrest ([place])   . Chronic systolic CHF (congestive heart failure) ([place]) 01/21/2018  . Coronary artery disease    a. s/p CABG in 1982 b. redo CABG in 1994 c. cath in 2015 showing occluded left main and RCA, patent LIMA to LAD, patent SVG to OM, patent SVG to RCA d. stable anatomy by repeat cath in 01/2018  . Essential hypertension   . Headache   . History of hiatal hernia   . Hyperlipidemia   . Kidney stones   . OSA (obstructive sleep apnea) 02/05/2018   Severe obstructive sleep apnea with an AHI of 36.8/h with no significant central sleep apnea.   Now on CPAP at  5 cm H2O  . Peripheral vascular disease ([place])   . S/P cardiac cath 01/22/18  01/23/2018    Medications:  I have reviewed the patient's current medications.  Medications Prior to Admission  Medication Sig Dispense Refill  . acetaminophen (TYLENOL) 500 MG tablet Take 500 mg by mouth every 6 (six) hours as needed for moderate pain or headache.    Marland Kitchen amLODipine (NORVASC) 5 MG tablet TAKE 1 TABLET BY MOUTH DAILY. (Patient taking differently: Take 5 mg by mouth daily. ) 90 tablet 3  . b complex vitamins capsule Take 1 capsule by mouth daily. 90 capsule 0  . carvedilol (COREG) 6.25 MG tablet Take 1 tablet (6.25 mg total) by mouth 2 (two) times daily with a meal for 30 days. 180 tablet 0  . Coenzyme Q10-Vitamin E (QUNOL ULTRA COQ10 PO) Take by mouth at bedtime. liquid    . diazepam (VALIUM) 5 MG tablet TAKE 1 TABLET BY MOUTH ONCE DAILY AS NEEDED (Patient taking differently: Take 5 mg by mouth at bedtime as needed for anxiety (sleep). ) 30 tablet 2  . ferrous sulfate 325 (65 FE) MG tablet Take 1 tablet (325 mg total) by mouth daily with breakfast.  3  . furosemide (LASIX) 80 MG tablet Take 1 tablet (80 mg total) by mouth 2 (two) times daily. (Patient taking  differently: Take 80 mg by mouth 2 (two) times daily with a meal. Breakfast and supper) 180 tablet 3  . Krill Oil 500 MG CAPS Take 500 mg by mouth at bedtime.    . Magnesium 400 MG CAPS Take 400 mg by mouth at bedtime.    . Melatonin 1 MG TABS Take 2 mg by mouth at bedtime as needed (sleep).     . Multiple Vitamin (MULTIVITAMIN WITH MINERALS) TABS tablet Take 1 tablet by mouth daily.    . nitroGLYCERIN (NITROSTAT) 0.4 MG SL tablet Place 1 tablet (0.4 mg total) under the tongue every 5 (five) minutes as needed for chest pain (for  chest pain (MAX of 3 doses)). 25 tablet 6  . potassium chloride SA (K-DUR) 20 MEQ tablet Take 1 tablet (20 mEq total) by mouth daily. 30 tablet 5  . Propylene Glycol (SYSTANE COMPLETE) 0.6 % SOLN Place 1 drop into both eyes daily as needed (irritation).    . rosuvastatin (CRESTOR) 40 MG tablet TAKE 1 TABLET BY MOUTH DAILY. (Patient taking differently: Take 40 mg by mouth daily with supper. ) 90 tablet 3  . trospium (SANCTURA) 20 MG tablet Take 20 mg by mouth 2 (two) times daily.    Alveda Reasons 15 MG TABS tablet TAKE 1 TABLET (15 MG TOTAL) BY MOUTH DAILY WITH SUPPER. (Patient taking differently: Take 15 mg by mouth daily with supper. ) 30 tablet 5  . amiodarone (PACERONE) 200 MG tablet Take 1 tablet (200 mg total) by mouth daily. 30 tablet 6    ALLERGIES:   Allergies  Allergen Reactions  . Cyclobenzaprine Hcl Rash  . Tetracycline Rash         FAM HX: Family History  Problem Relation Age of Onset  . Heart attack Father 5  . Coronary artery disease Other        family history is dignificant for early CAD in several members    Social History:   reports that he quit smoking about 66 years ago. He has never used smokeless tobacco. He reports current alcohol use of about 5.0 - 6.0 standard drinks of alcohol per week. He reports that he does not use drugs.  ROS: 12 system ROS per HPI above  Blood pressure (!) 136/103, pulse 66, temperature (!) 97.2 F (36.2 C),  temperature source Oral, resp. rate (!) 33, SpO2 96 %. PHYSICAL EXAM: Gen: elderly man who is at 30 degrees on bipap - awake and no distress  Eyes: anicteric ENT: dry on bipap Neck: supple, cannot assess JVD clearly with bipap on CV: RRR, no rub appreciated Abd: soft, nontender Lungs: sl inc WOB on bipap, inc RR, sounds clear anteriorly and laterally with lots of ambient noise GU: condom cath with 316mL amber urine Extr:  Trace LE edema Neuro: conversant, appears globally nonfocal Skin: no rashes, warm and dry   Results for orders placed or performed during the hospital encounter of 10/28/2018 (from the past 48 hour(s))  I-STAT 7, (LYTES, BLD GAS, ICA, H+H)     Status: Abnormal   Collection Time: 10/09/2018  5:18 PM  Result Value Ref Range   pH, Arterial 7.437 7.350 - 7.450   pCO2 arterial 30.4 (L) 32.0 - 48.0 mmHg   pO2, Arterial 43.0 (L) 83.0 - 108.0 mmHg   Bicarbonate 20.5 20.0 - 28.0 mmol/L   TCO2 21 (L) 22 - 32 mmol/L   O2 Saturation 81.0 %   Acid-base deficit 3.0 (H) 0.0 - 2.0 mmol/L   Sodium 126 (L) 135 - 145 mmol/L   Potassium 6.2 (H) 3.5 - 5.1 mmol/L   Calcium, Ion 1.38 1.15 - 1.40 mmol/L   HCT 33.0 (L) 39.0 - 52.0 %   Hemoglobin 11.2 (L) 13.0 - 17.0 g/dL   Patient temperature HIDE    Sample type ARTERIAL   CBC     Status: Abnormal   Collection Time: 10/12/2018  5:20 PM  Result Value Ref Range   WBC 13.1 (H) 4.0 - 10.5 K/uL   RBC 3.65 (L) 4.22 - 5.81 MIL/uL   Hemoglobin 10.4 (L) 13.0 - 17.0 g/dL   HCT 33.2 (L) 39.0 - 52.0 %   MCV 91.0  80.0 - 100.0 fL   MCH 28.5 26.0 - 34.0 pg   MCHC 31.3 30.0 - 36.0 g/dL   RDW 25.7 (H) 11.5 - 15.5 %   Platelets 141 (L) 150 - 400 K/uL   nRBC 0.2 0.0 - 0.2 %    Comment: Performed at [facility] [address], [place], [place] [address]  Comprehensive metabolic panel     Status: Abnormal   Collection Time: 10/23/2018  5:20 PM  Result Value Ref Range   Sodium 128 (L) 135 - 145 mmol/L   Potassium 6.5 (HH) 3.5 - 5.1 mmol/L     Comment: NO VISIBLE HEMOLYSIS CRITICAL RESULT CALLED TO, READ BACK BY AND VERIFIED WITH:  T HAMILTON,RN 1827 10/27/2018 WBOND    Chloride 94 (L) 98 - 111 mmol/L   CO2 22 22 - 32 mmol/L   Glucose, Bld 191 (H) 70 - 99 mg/dL   BUN 70 (H) 8 - 23 mg/dL   Creatinine, Ser 3.60 (H) 0.61 - 1.24 mg/dL   Calcium 10.9 (H) 8.9 - 10.3 mg/dL   Total Protein 6.9 6.5 - 8.1 g/dL   Albumin 3.6 3.5 - 5.0 g/dL   AST 59 (H) 15 - 41 U/L   ALT 55 (H) 0 - 44 U/L   Alkaline Phosphatase 61 38 - 126 U/L   Total Bilirubin 1.8 (H) 0.3 - 1.2 mg/dL   GFR calc non Af Amer 15 (L) >60 mL/min   GFR calc Af Amer 17 (L) >60 mL/min   Anion gap 12 5 - 15    Comment: Performed at [facility], [address], [place], [place] [address]  Troponin I (High Sensitivity)     Status: Abnormal   Collection Time: 10/10/2018  5:20 PM  Result Value Ref Range   Troponin I (High Sensitivity) 213 (HH) <18 ng/L    Comment: CRITICAL RESULT CALLED TO, READ BACK BY AND VERIFIED WITH:  T HAMILTON,RN 1827 10/30/2018 WBOND (NOTE) Elevated high sensitivity troponin I (hsTnI) values and significant  changes across serial measurements may suggest ACS but many other  chronic and acute conditions are known to elevate hsTnI results.  Refer to the Links section for chest pain algorithms and additional  guidance. Performed at [facility], [place] [address], [place], [place] [address]   Brain natriuretic peptide     Status: Abnormal   Collection Time: 10/11/2018  5:20 PM  Result Value Ref Range   B Natriuretic Peptide 3,312.4 (H) 0.0 - 100.0 pg/mL    Comment: Performed at [facility] [address] [place], [place] [address]  I-Stat Creatinine, ED (not at [facility])     Status: Abnormal   Collection Time: 10/11/2018  5:22 PM  Result Value Ref Range   Creatinine, Ser 3.60 (H) 0.61 - 1.24 mg/dL  Troponin I (High Sensitivity)     Status: Abnormal   Collection Time: 11/01/2018  7:22 PM  Result Value Ref Range   Troponin I (High  Sensitivity) 207 (HH) <18 ng/L    Comment: CRITICAL VALUE NOTED.  VALUE IS CONSISTENT WITH PREVIOUSLY REPORTED AND CALLED VALUE. (NOTE) Elevated high sensitivity troponin I (hsTnI) values and significant  changes across serial measurements may suggest ACS but many other  chronic and acute conditions are known to elevate hsTnI results.  Refer to the Links section for chest pain algorithms and additional  guidance. Performed at [facility], [place] [address] [place],  [address]   SARS Coronavirus  2 (CEPHEID - Performed in [facility] lab), Hosp Order     Status: None   Collection Time: 11/02/2018  7:27 PM   Specimen: Nasopharyngeal Swab  Result Value Ref Range   SARS Coronavirus 2 NEGATIVE NEGATIVE    Comment: (NOTE) If result is NEGATIVE SARS-CoV-2 target nucleic acids are NOT DETECTED. The SARS-CoV-2 RNA is generally detectable in upper and lower  respiratory specimens during the acute phase of infection. The lowest  concentration of SARS-CoV-2 viral copies this assay can detect is 250  copies / mL. A negative result does not preclude SARS-CoV-2 infection  and should not be used as the sole basis for treatment or other  patient management decisions.  A negative result may occur with  improper specimen collection / handling, submission of specimen other  than nasopharyngeal swab, presence of viral mutation(s) within the  areas targeted by this assay, and inadequate number of viral copies  (<250 copies / mL). A negative result must be combined with clinical  observations, patient history, and epidemiological information. If result is POSITIVE SARS-CoV-2 target nucleic acids are DETECTED. The SARS-CoV-2 RNA is generally detectable in upper and lower  respiratory specimens dur ing the acute phase of infection.  Positive  results are indicative of active infection with SARS-CoV-2.  Clinical  correlation with patient history and other diagnostic information is   necessary to determine patient infection status.  Positive results do  not rule out bacterial infection or co-infection with other viruses. If result is PRESUMPTIVE POSTIVE SARS-CoV-2 nucleic acids MAY BE PRESENT.   A presumptive positive result was obtained on the submitted specimen  and confirmed on repeat testing.  While 2019 novel coronavirus  (SARS-CoV-2) nucleic acids may be present in the submitted sample  additional confirmatory testing may be necessary for epidemiological  and / or clinical management purposes  to differentiate between  SARS-CoV-2 and other Sarbecovirus currently known to infect humans.  If clinically indicated additional testing with an alternate test  methodology [phone]) is advised. The SARS-CoV-2 RNA is generally  detectable in upper and lower respiratory sp ecimens during the acute  phase of infection. The expected result is Negative. Fact Sheet for Patients:  StrictlyIdeas.no Fact Sheet for Healthcare Providers: BankingDealers.co.za This test is not yet approved or cleared by the [place] FDA and has been authorized for detection and/or diagnosis of SARS-CoV-2 by FDA under an Emergency Use Authorization (EUA).  This EUA will remain in effect (meaning this test can be used) for the duration of the COVID-19 declaration under Section 564(b)(1) of the Act, 21 U.S.C. section 360bbb-3(b)(1), unless the authorization is terminated or revoked sooner. Performed at [facility], [place] [address], [place], [place] [address]     Nm Parathyroid W/spect  Result Date: 10/13/2018 CLINICAL DATA:  Hyperparathyroidism.  High PTH H EXAM: NM PARATHYROID SCINTIGRAPHY AND SPECT IMAGING TECHNIQUE: Following intravenous administration of radiopharmaceutical, early and 2-hour delayed planar images were obtained in the anterior projection. Delayed triplanar SPECT images were also obtained at 2 hours. RADIOPHARMACEUTICALS:   25.2 mCi Tc-35m Sestamibi IV COMPARISON:  None FINDINGS: On delayed 2 hour imaging there is residual activity in the RIGHT lobe of thyroid gland. SPECT imaging of the neck demonstrates focal activity in the mid RIGHT thyroid gland positioned posterior to the gland. IMPRESSION: Focal activity posterior to the mid RIGHT thyroid gland would be consistent with a parathyroid adenoma. Electronically Signed   By: Suzy Bouchard M.D.   On: 10/13/2018 14:34  Dg Chest Port 1 View  Result Date: 11/07/2018 CLINICAL DATA:  Pt is SOB today with O2 in mid 80s. Pt states he feels very weak. Seen at nephrology today and told to come straight to ED. Pt's O2 increased from 2L [place] to 6L. Pt on non-rebreathe. Hx of CHF, HTN, cardiac arrest. EXAM: PORTABLE CHEST 1 VIEW COMPARISON:  09/02/2018 FINDINGS: There are changes from prior cardiac surgery and aortic valve replacement. Cardiac silhouette is mildly enlarged. No mediastinal or hilar masses. Left anterior chest wall AICD is stable. There is vascular congestion with bilateral interstitial thickening and small bilateral pleural effusions. Findings are similar to the prior study. No pneumothorax. Skeletal structures are grossly intact. IMPRESSION: 1. Findings consistent with congestive heart failure, similar to what was present on the prior radiographs. No convincing pneumonia. Electronically Signed   By: Lajean Manes M.D.   On: 10/12/2018 17:02    Assessment/Plan **Acute hypoxic respiratory failure:  Presumable due to decompensated heart failure as evidenced by imaging, BNP and increased fluid intake in past few days.  Now requiring bipap due to desaturations.  Just rec'd lasix 80 IV - hasn't had time to respond yet but if hasn't diuresed much in next few hours and still hypoxic would increase dose and given lasix 120 IV next.  Could increase further, add metolazone as well.   **AKI on CKD: Baseline lately ~2 > 3.6 today.  I suspect that this is decompensated heart  failure/cardiorenal physiology in global context of presentation.  Hopefully will improve with diuresis.  Urine sodium and creatinine ordered now -- the diuretics haven't had time to work so if these are sent now I think they may be helpful, but if later on s/p diuretics only really helpful if low.  Query mild uremic symptom with dysgeusia in past few days.  Not an optimal dialysis candidate but if clinical status not improving can approach topic with patient and family.   **hyperkalemia:  K 6.5, no EKG changes.  S/p acute tx in the ED + lokelma.  Diuresis will help.  Primary checking q4h.    **Hyponatremia:  Na 128.  Presentation consistent with hypervolemic hyponatremia.  Diuresis per above.   **Atrial fibrillation:  Rate controlled.  On home xarelto - if renal function remains poor may be better off on apixiban, follow.    **Primary hyperparathyroidism:  Hypercalcemia dating back >1y.  "Serologic" w/u via clinic unrevealing except elevated PTH.  S/p sestamibi scan today - suggests adenoma.  Corr calcium currently ~11.3.  No acute therapy tonight but consider initiating sensipar as poor surgical candidate and hyperCa not going to help renal function.   **possibel R RAS:  08/2018 renal artery duplex suggests R RAS.  R kidney 10.8 and L 12.5, cortical thinning worse on R. Outpt nephrologist considering CO2 angiography to confirm - will need clinical improvement prior to pursuing.  BP currently ok and I suspect his AKI is unrelated.  Unclear that stenting would improve CKD with cortical thinning already fairly severe on that side.   **Anemia:  Hb 10.4, reasonable.  T sat 08/2018 5% - rec'd feraheme and last check 25% in mid 09/2018.    Will follow closely, call with questions or concerns.    Justin Mend 10/26/2018, 10:00 PM

## 2018-10-20 NOTE — ED Notes (Signed)
Attempted to call report

## 2018-10-21 ENCOUNTER — Inpatient Hospital Stay (HOSPITAL_COMMUNITY): Payer: Medicare Other

## 2018-10-21 DIAGNOSIS — I5043 Acute on chronic combined systolic (congestive) and diastolic (congestive) heart failure: Secondary | ICD-10-CM

## 2018-10-21 DIAGNOSIS — J9621 Acute and chronic respiratory failure with hypoxia: Secondary | ICD-10-CM

## 2018-10-21 DIAGNOSIS — J810 Acute pulmonary edema: Secondary | ICD-10-CM

## 2018-10-21 DIAGNOSIS — J9620 Acute and chronic respiratory failure, unspecified whether with hypoxia or hypercapnia: Secondary | ICD-10-CM

## 2018-10-21 DIAGNOSIS — R0902 Hypoxemia: Secondary | ICD-10-CM

## 2018-10-21 LAB — URINALYSIS, ROUTINE W REFLEX MICROSCOPIC
Bacteria, UA: NONE SEEN
Bilirubin Urine: NEGATIVE
Glucose, UA: NEGATIVE mg/dL
Hgb urine dipstick: NEGATIVE
Ketones, ur: NEGATIVE mg/dL
Leukocytes,Ua: NEGATIVE
Nitrite: NEGATIVE
Protein, ur: 30 mg/dL — AB
Specific Gravity, Urine: 1.013 (ref 1.005–1.030)
pH: 5 (ref 5.0–8.0)

## 2018-10-21 LAB — BLOOD GAS, ARTERIAL
Acid-Base Excess: 1.5 mmol/L (ref 0.0–2.0)
Bicarbonate: 24 mmol/L (ref 20.0–28.0)
Delivery systems: POSITIVE
Drawn by: 247621
Expiratory PAP: 6
FIO2: 1
Inspiratory PAP: 14
O2 Saturation: 96.3 %
Patient temperature: 98.5
RATE: 8 resp/min
pCO2 arterial: 28.2 mmHg — ABNORMAL LOW (ref 32.0–48.0)
pH, Arterial: 7.539 — ABNORMAL HIGH (ref 7.350–7.450)
pO2, Arterial: 84.9 mmHg (ref 83.0–108.0)

## 2018-10-21 LAB — POTASSIUM
Potassium: 6 mmol/L — ABNORMAL HIGH (ref 3.5–5.1)
Potassium: 5 mmol/L (ref 3.5–5.1)
Potassium: 4.6 mmol/L (ref 3.5–5.1)
Potassium: 4.3 mmol/L (ref 3.5–5.1)
Potassium: 3.9 mmol/L (ref 3.5–5.1)
Potassium: 3.7 mmol/L (ref 3.5–5.1)

## 2018-10-21 LAB — HEPATIC FUNCTION PANEL
ALT: 65 U/L — ABNORMAL HIGH (ref 0–44)
AST: 84 U/L — ABNORMAL HIGH (ref 15–41)
Albumin: 3.3 g/dL — ABNORMAL LOW (ref 3.5–5.0)
Alkaline Phosphatase: 63 U/L (ref 38–126)
Bilirubin, Direct: 0.7 mg/dL — ABNORMAL HIGH (ref 0.0–0.2)
Indirect Bilirubin: 1.2 mg/dL — ABNORMAL HIGH (ref 0.3–0.9)
Total Bilirubin: 1.9 mg/dL — ABNORMAL HIGH (ref 0.3–1.2)
Total Protein: 6.3 g/dL — ABNORMAL LOW (ref 6.5–8.1)

## 2018-10-21 LAB — BASIC METABOLIC PANEL
Anion gap: 16 — ABNORMAL HIGH (ref 5–15)
BUN: 73 mg/dL — ABNORMAL HIGH (ref 8–23)
CO2: 21 mmol/L — ABNORMAL LOW (ref 22–32)
Calcium: 10.2 mg/dL (ref 8.9–10.3)
Chloride: 97 mmol/L — ABNORMAL LOW (ref 98–111)
Creatinine, Ser: 3.33 mg/dL — ABNORMAL HIGH (ref 0.61–1.24)
GFR calc Af Amer: 19 mL/min — ABNORMAL LOW (ref >60)
GFR calc non Af Amer: 16 mL/min — ABNORMAL LOW (ref >60)
Glucose, Bld: 103 mg/dL — ABNORMAL HIGH (ref 70–99)
Potassium: 5.2 mmol/L — ABNORMAL HIGH (ref 3.5–5.1)
Sodium: 134 mmol/L — ABNORMAL LOW (ref 135–145)

## 2018-10-21 LAB — SODIUM, URINE, RANDOM: Sodium, Ur: <10 mmol/L

## 2018-10-21 LAB — CREATININE, URINE, RANDOM: Creatinine, Urine: 71.02 mg/dL

## 2018-10-21 LAB — CBC
HCT: 33.9 % — ABNORMAL LOW (ref 39.0–52.0)
Hemoglobin: 10.7 g/dL — ABNORMAL LOW (ref 13.0–17.0)
MCH: 28.1 pg (ref 26.0–34.0)
MCHC: 31.6 g/dL (ref 30.0–36.0)
MCV: 89 fL (ref 80.0–100.0)
Platelets: 114 10*3/uL — ABNORMAL LOW (ref 150–400)
RBC: 3.81 MIL/uL — ABNORMAL LOW (ref 4.22–5.81)
RDW: 26 % — ABNORMAL HIGH (ref 11.5–15.5)
WBC: 14.2 10*3/uL — ABNORMAL HIGH (ref 4.0–10.5)
nRBC: 0.4 % — ABNORMAL HIGH (ref 0.0–0.2)

## 2018-10-21 LAB — C-REACTIVE PROTEIN: CRP: 26.1 mg/dL — ABNORMAL HIGH (ref <1.0)

## 2018-10-21 LAB — SEDIMENTATION RATE: Sed Rate: 98 mm/hr — ABNORMAL HIGH (ref 0–16)

## 2018-10-21 MED ORDER — FUROSEMIDE 10 MG/ML IJ SOLN
160.0000 mg | Freq: Four times a day (QID) | INTRAVENOUS | Status: DC
  Administered 2018-10-21: 160 mg via INTRAVENOUS
  Filled 2018-10-21 (×3): qty 16

## 2018-10-21 MED ORDER — METHYLPREDNISOLONE SODIUM SUCC 125 MG IJ SOLR: 60.0000 mg | Freq: Four times a day (QID) | INTRAMUSCULAR | Status: DC

## 2018-10-21 MED ORDER — ORAL CARE MOUTH RINSE: 15.0000 mL | Freq: Two times a day (BID) | OROMUCOSAL | Status: DC

## 2018-10-21 MED ORDER — CHLORHEXIDINE GLUCONATE 0.12 % MT SOLN
15.0000 mL | Freq: Two times a day (BID) | OROMUCOSAL | Status: DC
  Filled 2018-10-21: qty 15

## 2018-10-21 MED ORDER — SODIUM CHLORIDE 0.9 % IV SOLN
INTRAVENOUS | Status: DC | PRN
  Administered 2018-10-21: 250 mL via INTRAVENOUS

## 2018-10-21 MED ORDER — METOLAZONE 5 MG PO TABS: 5.0000 mg | ORAL_TABLET | Freq: Every day | ORAL | Status: DC

## 2018-10-21 MED ORDER — FUROSEMIDE 10 MG/ML IJ SOLN
160.0000 mg | Freq: Two times a day (BID) | INTRAVENOUS | Status: DC
  Filled 2018-10-21 (×2): qty 16

## 2018-10-21 MED ORDER — FUROSEMIDE 10 MG/ML IJ SOLN
120.0000 mg | Freq: Two times a day (BID) | INTRAVENOUS | Status: DC
  Administered 2018-10-21: 120 mg via INTRAVENOUS
  Filled 2018-10-21: qty 12
  Filled 2018-10-21: qty 2
  Filled 2018-10-21: qty 12

## 2018-10-21 MED ORDER — METOLAZONE 5 MG PO TABS: 5.0000 mg | ORAL_TABLET | Freq: Once | ORAL | Status: DC

## 2018-10-21 NOTE — Consult Note (Addendum)
NAME:  [name], MRN:  [id number], DOB:  July 07, 1933, LOS: 1 ADMISSION DATE:  10/15/2018, CONSULTATION DATE:  10/11/2018 REFERRING MD:  Dr. Cathlean Sauer, CHIEF COMPLAINT:  Respiratory failure  Brief History   85 year old male with multiple medical problems admitted 7/13 with worsening shortness of breath, AKI, and hyperkalemia.  On Bipap since admission.  Despite diuresis, patient with progression of CXR.  PCCM consulted for further recommendations.   History of present illness   Limited information obtained from patient as patient is on BiPAP and unable to give full history.   85 year old male with history of chronic respiratory failure on home O2 at 2L [place], systolic HF/ ICM s/p medtronic ICD (08/2018 TTE w/ EF 30-35%), persistent atrial fibrillation on Xarelto, CAD s/p CABG in 1982 and redo in 1994, AS s/p TAVR, HTN, HLD, OSA, PVD, and CKD stage III sent from nephrology clinic visit on 7/13 for hypoxia with increased O2 needs, generalized weakness, and weight gain.    Patient with recent hospitalization in May 2020 for acute on chronic heart failure; he was discharged home on O2 at 2L.  Discharge weight at time of discharge was 65 kg (currently 65.8 kg).  On chart review, patient on prior home amiodarone possibly since 02/2018.    He was admitted to [facility] for pulmonary edema, started on diuresis and placed on BiPAP.  Noted for negative COVID, sCr of 3.6 with previous baseline of 1.8, BUN 70, K 6.5, Na 128, elevated BNP, WBC 13.1.  Despite diuresis of negative 1.4 liters, patient continued to have shortness of breath, requiring BiPAP, and worsening CXR showing worsening bilateral opacities concerning for edema vs pneumonia.  He has been tachypneic and requiring 1.0 FiO2 on BiPAP, but otherwise afebrile, normotensive, controlled rhythm.  Nephrology and heart failure team consulted.  Some concern for possible amiodarone toxicity.  PCCM additionally consulted for further pulmonary recommendations.   Past  Medical History  Chronic hypoxic respiratory failure on home 2L O2, systolic HF/ ICM s/p medtronic ICD (08/2018 TTE w/ EF 30-35%), persistent atrial fibrillation on Xarelto, CAD s/p CABG in 1982 and redo in 1994, AS s/p TAVR, HTN, HLD, OSA, PVD, and CKD stage III  Significant Hospital Events   7/13 Admitted  Consults:  Nephrology Cardiology  PCCM   Procedures:   Significant Diagnostic Tests:   Micro Data:  7/13 SARS coronavirus 2 >> negative   Antimicrobials:   Interim history/subjective:  Currently on BiPAP 10/5, 1.0 FiO2   Objective   Blood pressure 112/62, pulse 68, temperature (!) 97.5 F (36.4 C), temperature source Axillary, resp. rate (!) 36, height 5' 6"  (1.676 m), weight 65.8 kg, SpO2 94 %.    FiO2 (%):  [80 %-100 %] 100 %   Intake/Output Summary (Last 24 hours) at 10/29/2018 1738 Last data filed at 10/26/2018 1627 Gross per 24 hour  Intake 177.71 ml  Output 1675 ml  Net -1497.29 ml   Filed Weights   10/12/2018 2200 10/23/2018 0455  Weight: 65.8 kg 65.8 kg   Examination: General: thin elderly male on BiPAP resting in bed.  HENT: [place]/AT, PERRL Lungs: bibasilar crackles Cardiovascular: RRR, no MRG Abdomen: Soft, non-tender, non-distended Extremities: No acute deformity or ROM limitation. No Edema Neuro: Alert, oriented, non-focal.   Resolved Hospital Problem list    Assessment & Plan:    Acute hypoxemic respiratory failure: likely pulmonary edema in the setting of known systolic CHF and bilateral infiltrates on CXR, however, he is not clearly volume up  on exam. He has been seen by advanced heart failure service and there is some concern for amiodarone toxicity vs infectious process. Patient clearly states he would not want to be on ventilator, even if the alternative was death. Plan  - Continue BiPAP (settings adjusted tin increase EPAP and and O2 sats very quickly improved) - Can try heated high flow [place] to give him a break from BiPAP. Would start with Fio2  100% and flow of at least 40 lpm if he can tolerate, which should provide some needed PEEP.  - Elevate HOB as he is orthopneic  - Diuresis under the direction of heart failure service - Send ESR, PCT, and CRP - Currently no role for steroids.  - Pan culture, but defer antibiotics  AKI on CKD - management per nephrology/primary  Atrial fibrillation - management per cardiology   Best practice:  Diet: NPO on BiPAP Pain/Anxiety/Delirium protocol (if indicated): NA VAP protocol (if indicated): NA DVT prophylaxis: Xarelto GI prophylaxis: NA Glucose control: NA Mobility: BR Code Status: DNR Family Communication: Patient updated Disposition: SDU  Labs   CBC: Recent Labs  Lab 10/11/2018 1718 10/16/2018 1720 10/19/2018 1017  WBC  --  13.1* 14.2*  HGB 11.2* 10.4* 10.7*  HCT 33.0* 33.2* 33.9*  MCV  --  91.0 89.0  PLT  --  141* 114*    Basic Metabolic Panel: Recent Labs  Lab 10/28/2018 1718 11/02/2018 1720 10/16/2018 1722 10/13/2018 2218 10/10/2018 0225 10/19/2018 0640 11/06/2018 1017 10/08/2018 1500  NA 126* 128*  --   --   --  134*  --   --   K 6.2* 6.5*  --  6.0* 5.0 5.2* 4.6 4.3  CL  --  94*  --   --   --  97*  --   --   CO2  --  22  --   --   --  21*  --   --   GLUCOSE  --  191*  --   --   --  103*  --   --   BUN  --  70*  --   --   --  73*  --   --   CREATININE  --  3.60* 3.60*  --   --  3.33*  --   --   CALCIUM  --  10.9*  --   --   --  10.2  --   --    GFR: Estimated Creatinine Clearance: 15.4 mL/min (A) (by C-G formula based on SCr of 3.33 mg/dL (H)). Recent Labs  Lab 10/15/2018 1720 10/30/2018 1017  WBC 13.1* 14.2*    Liver Function Tests: Recent Labs  Lab 10/31/2018 1720 10/13/2018 0640  AST 59* 84*  ALT 55* 65*  ALKPHOS 61 63  BILITOT 1.8* 1.9*  PROT 6.9 6.3*  ALBUMIN 3.6 3.3*   No results for input(s): LIPASE, AMYLASE in the last 168 hours. No results for input(s): AMMONIA in the last 168 hours.  ABG    Component Value Date/Time   PHART 7.539 (H) 10/11/2018  0441   PCO2ART 28.2 (L) 10/10/2018 0441   PO2ART 84.9 11/05/2018 0441   HCO3 24.0 11/07/2018 0441   TCO2 21 (L) 10/11/2018 1718   ACIDBASEDEF 3.0 (H) 11/02/2018 1718   O2SAT 96.3 10/16/2018 0441     Coagulation Profile: No results for input(s): INR, PROTIME in the last 168 hours.  Cardiac Enzymes: No results for input(s): CKTOTAL, CKMB, CKMBINDEX, TROPONINI in the last 168 hours.  HbA1C:  Hgb A1c MFr Bld  Date/Time Value Ref Range Status  01/20/2018 11:59 PM 6.2 (H) 4.8 - 5.6 % Final    Comment:    (NOTE) Pre diabetes:          5.7%-6.4% Diabetes:              >6.4% Glycemic control for   <7.0% adults with diabetes   03/19/2014 02:18 PM 5.9 (H) <5.7 % Final    Comment:    (NOTE)                                                                       According to the ADA Clinical Practice Recommendations for 2011, when HbA1c is used as a screening test:  >=6.5%   Diagnostic of Diabetes Mellitus           (if abnormal result is confirmed) 5.7-6.4%   Increased risk of developing Diabetes Mellitus References:Diagnosis and Classification of Diabetes Mellitus,Diabetes WCHE,5277,82(UMPNT 1):S62-S69 and Standards of Medical Care in         Diabetes - 2011,Diabetes IRWE,3154,00 (Suppl 1):S11-S61.     CBG: No results for input(s): GLUCAP in the last 168 hours.  Review of Systems:   Unable as patient on full face BiPAP   Past Medical History  He,  has a past medical history of Aortic stenosis, severe, Arthritis, Cardiac arrest ([place]), Chronic systolic CHF (congestive heart failure) ([place]) (01/21/2018), Coronary artery disease, Essential hypertension, Headache, History of hiatal hernia, Hyperlipidemia, Kidney stones, OSA (obstructive sleep apnea) (02/05/2018), Peripheral vascular disease ([place]), and S/P cardiac cath 01/22/18  (01/23/2018).   Surgical History    Past Surgical History:  Procedure Laterality Date   ADENOIDECTOMY     APPENDECTOMY     CARDIAC CATHETERIZATION    11/16/2009    CARDIAC DEFIBRILLATOR PLACEMENT     CARDIOVERSION N/A 02/24/2018   Procedure: CARDIOVERSION;  Surgeon: Sueanne Margarita, MD;  Location: [facility] ENDOSCOPY;  Service: Cardiovascular;  Laterality: N/A;   CARDIOVERSION N/A 08/21/2018   Procedure: CARDIOVERSION;  Surgeon: Skeet Latch, MD;  Location: [facility] ENDOSCOPY;  Service: Cardiovascular;  Laterality: N/A;   cataract surgery      Bilateral cataract surgery   CORONARY ARTERY BYPASS GRAFT     in 1982 and '84 with subsequent PTCI   EP IMPLANTABLE DEVICE N/A 03/21/2016   Procedure: [place];  Surgeon: Deboraha Sprang, MD;  Location: [place] CV LAB;  Service: Cardiovascular;  Laterality: N/A;   HERNIA REPAIR     LEFT AND RIGHT HEART CATHETERIZATION WITH CORONARY/GRAFT ANGIOGRAM N/A 02/01/2014   Procedure: LEFT AND RIGHT HEART CATHETERIZATION WITH Beatrix Fetters;  Surgeon: Sinclair Grooms, MD;  Location: [facility] CATH LAB;  Service: Cardiovascular;  Laterality: N/A;   left elbow tendon surgery     LEFT HEART CATH AND CORS/GRAFTS ANGIOGRAPHY N/A 01/22/2018   Procedure: LEFT HEART CATH AND CORS/GRAFTS ANGIOGRAPHY;  Surgeon: Leonie Man, MD;  Location: [place] CV LAB;  Service: Cardiovascular;  Laterality: N/A;   TEE WITHOUT CARDIOVERSION N/A 03/23/2014   Procedure: TRANSESOPHAGEAL ECHOCARDIOGRAM (TEE);  Surgeon: Burnell Blanks, MD;  Location: [place];  Service: Open Heart Surgery;  Laterality: N/A;   TONSILLECTOMY     TRANSCATHETER AORTIC VALVE REPLACEMENT, TRANSFEMORAL  N/A 03/23/2014   Procedure: TRANSCATHETER AORTIC VALVE REPLACEMENT, TRANSFEMORAL;  Surgeon: Burnell Blanks, MD;  Location: [place];  Service: Open Heart Surgery;  Laterality: N/A;     Social History   reports that he quit smoking about 66 years ago. He has never used smokeless tobacco. He reports current alcohol use of about 5.0 - 6.0 standard drinks of alcohol per week. He reports that he does not use drugs.    Family History   His family history includes Coronary artery disease in an other family member; Heart attack (age of onset: 69) in his father.   Allergies Allergies  Allergen Reactions   Cyclobenzaprine Hcl Rash   Tetracycline Rash          Home Medications  Prior to Admission medications   Medication Sig Start Date End Date Taking? Authorizing Provider  acetaminophen (TYLENOL) 500 MG tablet Take 500 mg by mouth every 6 (six) hours as needed for moderate pain or headache.   Yes [provider]  amLODipine (NORVASC) 5 MG tablet TAKE 1 TABLET BY MOUTH DAILY. Patient taking differently: Take 5 mg by mouth daily.  12/23/17  Yes Burnell Blanks, MD  b complex vitamins capsule Take 1 capsule by mouth daily. 09/02/18  Yes Burns, Claudina Lick, MD  carvedilol (COREG) 6.25 MG tablet Take 1 tablet (6.25 mg total) by mouth 2 (two) times daily with a meal for 30 days. 09/22/18 11/09/18 Yes Burns, Claudina Lick, MD  Coenzyme Q10-Vitamin E (QUNOL ULTRA COQ10 PO) Take by mouth at bedtime. liquid   Yes [provider]  diazepam (VALIUM) 5 MG tablet TAKE 1 TABLET BY MOUTH ONCE DAILY AS NEEDED Patient taking differently: Take 5 mg by mouth at bedtime as needed for anxiety (sleep).  05/19/18  Yes Burnell Blanks, MD  ferrous sulfate 325 (65 FE) MG tablet Take 1 tablet (325 mg total) by mouth daily with breakfast. 08/10/18  Yes Burns, Claudina Lick, MD  furosemide (LASIX) 80 MG tablet Take 1 tablet (80 mg total) by mouth 2 (two) times daily. Patient taking differently: Take 80 mg by mouth 2 (two) times daily with a meal. Breakfast and supper 09/06/18  Yes Strader, [place] M, PA-C  Krill Oil 500 MG CAPS Take 500 mg by mouth at bedtime.   Yes [provider]  Magnesium 400 MG CAPS Take 400 mg by mouth at bedtime.   Yes [provider]  Melatonin 1 MG TABS Take 2 mg by mouth at bedtime as needed (sleep).    Yes [provider]  Multiple Vitamin (MULTIVITAMIN WITH  MINERALS) TABS tablet Take 1 tablet by mouth daily.   Yes [provider]  nitroGLYCERIN (NITROSTAT) 0.4 MG SL tablet Place 1 tablet (0.4 mg total) under the tongue every 5 (five) minutes as needed for chest pain (for chest pain (MAX of 3 doses)). 04/12/15  Yes Burnell Blanks, MD  potassium chloride SA (K-DUR) 20 MEQ tablet Take 1 tablet (20 mEq total) by mouth daily. 09/06/18  Yes Strader, [place] M, PA-C  Propylene Glycol (SYSTANE COMPLETE) 0.6 % SOLN Place 1 drop into both eyes daily as needed (irritation).   Yes [provider]  rosuvastatin (CRESTOR) 40 MG tablet TAKE 1 TABLET BY MOUTH DAILY. Patient taking differently: Take 40 mg by mouth daily with supper.  12/23/17  Yes Burnell Blanks, MD  trospium (SANCTURA) 20 MG tablet Take 20 mg by mouth 2 (two) times daily.   Yes [provider]  Alveda Reasons  15 MG TABS tablet TAKE 1 TABLET (15 MG TOTAL) BY MOUTH DAILY WITH SUPPER. Patient taking differently: Take 15 mg by mouth daily with supper.  08/22/18  Yes Deboraha Sprang, MD  amiodarone (PACERONE) 200 MG tablet Take 1 tablet (200 mg total) by mouth daily. 02/22/18   Consuelo Pandy, PA-C         Kennieth Rad, MSN, AGACNP-BC [facility] Pgr: [phone] or if no answer [phone] 10/24/2018, 6:04 PM  Attending Note:  85 year old male with PMH of CHF s/p TAVR and CKD with worsening SOB.  Patient was admitted to progressive care unit and was started on BiPAP.  PCCM was consulted to assist with BiPAP management and potential need for intubation.  Patient has no complaints at this time other than mild shortness of breath.  On exam, distant BS with mild crackles at the bases.  I reviewed CXR myself, infiltrate noted diffusely.  Discussed with PCCM-NP and TRH-MD.  Acute respiratory failure: DDx of heart failure, amiodarone lung or an active infection.  - ESR/CRP if elevated would consider steroids  - Procalcitonin, if positive then  consider abx  - Cultures ordered  - BiPAP - increase EPAP to 10 and IPAP to 15  - No need for ABGs at this point  - Sit patient up as able  - If COVID is still a consideration would consider consulting with ID  - Diureses as ordered  Hypoxemia:  - BiPAP for comfort  - Make HFNC available  Acute pulmonary edema:  - Diureses as above  GOC: spoke with patient and wife, patient has always been DNR, evidently changed his mind in the ER for short term intubation only but now again would like to be DNR.  I spoke with his wife and insured that this is consistent with his previous wishes and living wills and it is.  Will change to a full DNR.  If worsens then transition to comfort measures.  PCCM will sign off, please call back if needed.  The patient is critically ill with multiple organ systems failure and requires high complexity decision making for assessment and support, frequent evaluation and titration of therapies, application of advanced monitoring technologies and extensive interpretation of multiple databases.   Critical Care Time devoted to patient care services described in this note is  36  Minutes. This time reflects time of care of this signee Dr Jennet Maduro. This critical care time does not reflect procedure time, or teaching time or supervisory time of PA/NP/Med student/Med Resident etc but could involve care discussion time.  Rush Farmer, M.D. [facility] Pulmonary/Critical Care Medicine. Pager: [phone]. After hours pager: [phone].

## 2018-10-21 NOTE — Discharge Instructions (Signed)

## 2018-10-21 NOTE — Consult Note (Addendum)
Cardiology Consultation:   Patient ID: [name] MRN: [id number]; DOB: 1933-09-05  Admit date: 10/17/2018 Date of Consult: 11/06/2018  Primary Care Provider: Binnie Rail, MD Primary Cardiologist: Lauree Chandler, MD  Primary Electrophysiologist:  Virl Axe, MD    Patient Profile:   [name] is a 85 y.o. male with a hx of persistent atrial fibrillation, CAD s/p CABG 1982 with redo 1994, ischemic cardiomyopathy with improved EF, history of out-of-hospital cardiac arrest in 2011 with subsequent Medtronic ICD implantation, aortic stenosis s/p TAVR, hypertension, hyperlipidemia and obstructive sleep apnea who is being seen today for the evaluation of increasing shortness of breath at the request of Dr. Cathlean Sauer.  History of Present Illness:   [name] is a 84 year old male with past medical history of persistent atrial fibrillation, CAD s/p CABG 1982 with redo 1994, ischemic cardiomyopathy with improved EF, history of out-of-hospital cardiac arrest in 2011 with subsequent Medtronic ICD implantation, aortic stenosis s/p TAVR, hypertension, hyperlipidemia and obstructive sleep apnea.  Cardiac catheterization in October 2015 prior to the TAVR procedure revealed occluded left main and RCA, patent LIMA to LAD, patent SVG to OM, patent SVG to RCA.  He underwent TAVR in December 2015.  He was admitted in October 2019 with atrial flutter with RVR and converted to sinus rhythm on amiodarone drip.  Due to elevation of the troponin, he had another cardiac catheterization in October 2019 which showed stable coronary anatomy.  At elevated troponin was felt to be related to demand ischemia.  He had cardioversion back to sinus rhythm in November 2019.  Echocardiogram obtained in November 2019 showed EF 40 to 45%, hypokinesis of the inferolateral myocardium.    Previous device interrogation in March 2020 revealed A. fib burden was around 31% of the time.  Unfortunately patient was admitted in May  2020 with malaise, fatigue and loss of taste for 1 week.  He was noted to have acute on chronic renal insufficiency.  Echocardiogram initially showed EF of 30 to 35% however this was corrected later by Dr. Harrington Challenger who says EF is really around 50 to 55% with apical hypokinesis however study had very poor cost window.  TAVR seems to be stable by echocardiogram.  He underwent IV diuresis at the time.  After discharge, patient had a cardioversion for atrial tachycardia on 08/21/2018.  His last admission was near the end of May for shortness of breath with associated chest pain.  He underwent another round of IV diuresis and he was eventually transitioned to p.o. Lasix 80 mg twice daily prior to discharge.  During the last EP visit, patient was noted to have a A. fib burden of 8.2%.  He was paced out of atrial fibrillation back into sinus rhythm.  For the past week, he has been having worsening shortness of breath.  He uses roughly 3 L of of oxygen at home and it has been unable to wean this off.  He denies any orthopnea or paroxysmal nocturnal dyspnea.  He does complain of productive cough but not aware of any high fever or chill.  He denies any lower extremity edema.  Patient was admitted to to [facility] on 11/03/2018, initial chest x-ray was concerning for acute heart failure.  Potassium was elevated at greater than 6.  Initial creatinine was also elevated at 3.6.  BNP 3312.  High-sensitivity troponin 213 --> 207.  Initial call the test was negative.  Patient underwent IV diuresis with Lasix drip.  He was able to put  out over a liter of fluid.  However chest x-ray showed worsening opacity in bilateral lung.  Heart failure service was consulted.   Heart Pathway Score:     Past Medical History:  Diagnosis Date   Aortic stenosis, severe    s/p TAVR with Edwards Sapien 3 THV (size 26 mm, model # 9600TFX, serial # [id number])   Arthritis    Cardiac arrest (HCC)    Chronic systolic CHF (congestive heart  failure) ([place]) 01/21/2018   Coronary artery disease    a. s/p CABG in 1982 b. redo CABG in 1994 c. cath in 2015 showing occluded left main and RCA, patent LIMA to LAD, patent SVG to OM, patent SVG to RCA d. stable anatomy by repeat cath in 01/2018   Essential hypertension    Headache    History of hiatal hernia    Hyperlipidemia    Kidney stones    OSA (obstructive sleep apnea) 02/05/2018   Severe obstructive sleep apnea with an AHI of 36.8/h with no significant central sleep apnea.   Now on CPAP at  5 cm H2O   Peripheral vascular disease ([place])    S/P cardiac cath 01/22/18  01/23/2018    Past Surgical History:  Procedure Laterality Date   ADENOIDECTOMY     APPENDECTOMY     CARDIAC CATHETERIZATION   11/16/2009    CARDIAC DEFIBRILLATOR PLACEMENT     CARDIOVERSION N/A 02/24/2018   Procedure: CARDIOVERSION;  Surgeon: Sueanne Margarita, MD;  Location: [facility];  Service: Cardiovascular;  Laterality: N/A;   CARDIOVERSION N/A 08/21/2018   Procedure: CARDIOVERSION;  Surgeon: Skeet Latch, MD;  Location: [facility] ENDOSCOPY;  Service: Cardiovascular;  Laterality: N/A;   cataract surgery      Bilateral cataract surgery   CORONARY ARTERY BYPASS GRAFT     in 1982 and '84 with subsequent PTCI   EP IMPLANTABLE DEVICE N/A 03/21/2016   Procedure: [place];  Surgeon: Deboraha Sprang, MD;  Location: [facility] CV LAB;  Service: Cardiovascular;  Laterality: N/A;   HERNIA REPAIR     LEFT AND RIGHT HEART CATHETERIZATION WITH CORONARY/GRAFT ANGIOGRAM N/A 02/01/2014   Procedure: LEFT AND RIGHT HEART CATHETERIZATION WITH Beatrix Fetters;  Surgeon: Sinclair Grooms, MD;  Location: [facility] CATH LAB;  Service: Cardiovascular;  Laterality: N/A;   left elbow tendon surgery     LEFT HEART CATH AND CORS/GRAFTS ANGIOGRAPHY N/A 01/22/2018   Procedure: LEFT HEART CATH AND CORS/GRAFTS ANGIOGRAPHY;  Surgeon: Leonie Man, MD;  Location: [place] CV LAB;  Service:  Cardiovascular;  Laterality: N/A;   TEE WITHOUT CARDIOVERSION N/A 03/23/2014   Procedure: TRANSESOPHAGEAL ECHOCARDIOGRAM (TEE);  Surgeon: Burnell Blanks, MD;  Location: [facility];  Service: Open Heart Surgery;  Laterality: N/A;   TONSILLECTOMY     TRANSCATHETER AORTIC VALVE REPLACEMENT, TRANSFEMORAL N/A 03/23/2014   Procedure: TRANSCATHETER AORTIC VALVE REPLACEMENT, TRANSFEMORAL;  Surgeon: Burnell Blanks, MD;  Location: [place];  Service: Open Heart Surgery;  Laterality: N/A;     Home Medications:  Prior to Admission medications   Medication Sig Start Date End Date Taking? Authorizing Provider  acetaminophen (TYLENOL) 500 MG tablet Take 500 mg by mouth every 6 (six) hours as needed for moderate pain or headache.   Yes [provider]  amLODipine (NORVASC) 5 MG tablet TAKE 1 TABLET BY MOUTH DAILY. Patient taking differently: Take 5 mg by mouth daily.  12/23/17  Yes Burnell Blanks, MD  b complex vitamins capsule Take 1 capsule by  mouth daily. 09/02/18  Yes Burns, Claudina Lick, MD  carvedilol (COREG) 6.25 MG tablet Take 1 tablet (6.25 mg total) by mouth 2 (two) times daily with a meal for 30 days. 09/22/18 2018/10/24 Yes Burns, Claudina Lick, MD  Coenzyme Q10-Vitamin E (QUNOL ULTRA COQ10 PO) Take by mouth at bedtime. liquid   Yes [provider]  diazepam (VALIUM) 5 MG tablet TAKE 1 TABLET BY MOUTH ONCE DAILY AS NEEDED Patient taking differently: Take 5 mg by mouth at bedtime as needed for anxiety (sleep).  05/19/18  Yes Burnell Blanks, MD  ferrous sulfate 325 (65 FE) MG tablet Take 1 tablet (325 mg total) by mouth daily with breakfast. 08/10/18  Yes Burns, Claudina Lick, MD  furosemide (LASIX) 80 MG tablet Take 1 tablet (80 mg total) by mouth 2 (two) times daily. Patient taking differently: Take 80 mg by mouth 2 (two) times daily with a meal. Breakfast and supper 09/06/18  Yes Strader, [place] M, PA-C  Krill Oil 500 MG CAPS Take 500 mg by mouth at bedtime.   Yes [provider]  Magnesium 400 MG CAPS Take 400 mg by mouth at bedtime.   Yes [provider]  Melatonin 1 MG TABS Take 2 mg by mouth at bedtime as needed (sleep).    Yes [provider]  Multiple Vitamin (MULTIVITAMIN WITH MINERALS) TABS tablet Take 1 tablet by mouth daily.   Yes [provider]  nitroGLYCERIN (NITROSTAT) 0.4 MG SL tablet Place 1 tablet (0.4 mg total) under the tongue every 5 (five) minutes as needed for chest pain (for chest pain (MAX of 3 doses)). 04/12/15  Yes Burnell Blanks, MD  potassium chloride SA (K-DUR) 20 MEQ tablet Take 1 tablet (20 mEq total) by mouth daily. 09/06/18  Yes Strader, [place] M, PA-C  Propylene Glycol (SYSTANE COMPLETE) 0.6 % SOLN Place 1 drop into both eyes daily as needed (irritation).   Yes [provider]  rosuvastatin (CRESTOR) 40 MG tablet TAKE 1 TABLET BY MOUTH DAILY. Patient taking differently: Take 40 mg by mouth daily with supper.  12/23/17  Yes Burnell Blanks, MD  trospium (SANCTURA) 20 MG tablet Take 20 mg by mouth 2 (two) times daily.   Yes [provider]  XARELTO 15 MG TABS tablet TAKE 1 TABLET (15 MG TOTAL) BY MOUTH DAILY WITH SUPPER. Patient taking differently: Take 15 mg by mouth daily with supper.  08/22/18  Yes Deboraha Sprang, MD  amiodarone (PACERONE) 200 MG tablet Take 1 tablet (200 mg total) by mouth daily. 02/22/18   Consuelo Pandy, PA-C    Inpatient Medications: Scheduled Meds:  carvedilol  6.25 mg Oral BID WC   chlorhexidine  15 mL Mouth Rinse BID   darifenacin  7.5 mg Oral Daily   mouth rinse  15 mL Mouth Rinse q12n4p   metolazone  5 mg Oral Daily   Rivaroxaban  15 mg Oral Q supper   rosuvastatin  40 mg Oral q1800   sodium chloride flush  3 mL Intravenous Q12H   Continuous Infusions:  sodium chloride     sodium chloride 250 mL (10/26/2018 0556)   furosemide     PRN Meds: sodium chloride, sodium chloride, acetaminophen, diazepam, Melatonin,  ondansetron (ZOFRAN) IV, sodium chloride flush  Allergies:    Allergies  Allergen Reactions   Cyclobenzaprine Hcl Rash   Tetracycline Rash         Social History:   Social History   Socioeconomic History   Marital  status: Married    Spouse name: Not on file   Number of children: 3   Years of education: Not on file   Highest education level: Not on file  Occupational History   Occupation: Retired  Scientist, product/process development strain: Not hard at [employer] insecurity    Worry: Never true    Inability: Never true   Transportation needs    Medical: No    Non-medical: No  Tobacco Use   Smoking status: Former Smoker    Quit date: 10/25/1952    Years since quitting: 66.0   Smokeless tobacco: Never Used  Substance and Sexual Activity   Alcohol use: Yes    Alcohol/week: 5.0 - 6.0 standard drinks    Types: 5 - 6 Glasses of wine per week    Comment: 5-6 times a week   Drug use: No   Sexual activity: Never  Lifestyle   Physical activity    Days per week: Not on file    Minutes per session: Not on file   Stress: Only a little  Relationships   Social connections    Talks on phone: More than three times a week    Gets together: More than three times a week    Attends religious service: More than 4 times per year    Active member of club or organization: Not on file    Attends meetings of clubs or organizations: More than 4 times per year    Relationship status: Married   Intimate partner violence    Fear of current or ex partner: No    Emotionally abused: No    Physically abused: No    Forced sexual activity: No  Other Topics Concern   Not on file  Social History Narrative   Not on file    Family History:    Family History  Problem Relation Age of Onset   Heart attack Father 69   Coronary artery disease Other        family history is dignificant for early CAD in several members     ROS:  Please see the history of present illness.    All other ROS reviewed and negative.     Physical Exam/Data:   Vitals:   10/29/2018 0753 10/27/2018 0800 10/18/2018 0813 10/31/2018 1336  BP:  (!) 112/53  112/62  Pulse: 72 71 69 68  Resp: (!) 36 (!) 32 (!) 25 20  Temp:    (!) 97.5 F (36.4 C)  TempSrc:    Axillary  SpO2: 96% 96% 91% 91%  Weight:      Height:        Intake/Output Summary (Last 24 hours) at 10/24/2018 1626 Last data filed at 10/31/2018 1554 Gross per 24 hour  Intake 166.3 ml  Output 1675 ml  Net -1508.7 ml   Last 3 Weights 10/19/2018 10/29/2018 10/15/2018  Weight (lbs) 145 lb 1 oz 145 lb 1 oz 142 lb 12.8 oz  Weight (kg) 65.8 kg 65.8 kg 64.774 kg     Body mass index is 23.41 kg/m.  General: Frail, struggling to breathe.  On BiPAP therapy HEENT: normal Lymph: no adenopathy Neck: no JVD Endocrine:  No thryomegaly Vascular: No carotid bruits; FA pulses 2+ bilaterally without bruits  Cardiac:  normal S1, S2; RRR; no murmur  Lungs: Bibasilar rhonchi and crackles. Abd: soft, nontender, no hepatomegaly  Ext: no edema Musculoskeletal:  No deformities, BUE and BLE strength normal and equal Skin: warm  and dry  Neuro:  CNs 2-12 intact, no focal abnormalities noted Psych:  Normal affect   EKG:  The EKG was personally reviewed and demonstrates: Ventricularly paced rhythm Telemetry:  Telemetry was personally reviewed and demonstrates: Ventricularly paced rhythm  Relevant CV Studies:  Echo 08/09/2018 IMPRESSIONS    1. The left ventricle has moderate-severely reduced systolic function, with an ejection fraction of 30-35%. The cavity size was mild to moderately dilated. Left ventricular diastolic Doppler parameters are indeterminate.  2. LVEF is approximately 50 to 55% with apical hypokinesis.  3. The right ventricle has normal systolic function. The cavity was normal. There is no increase in right ventricular wall thickness.  4. Left atrial size was severely dilated.  5. Trivial pericardial effusion is present.  6. The  mitral valve is abnormal. Moderate thickening of the mitral valve leaflet. There is mild mitral annular calcification present.  7. The aortic valve is tricuspid. Mild thickening of the aortic valve. Aortic valve regurgitation is trivial by color flow Doppler.  8. The aortic root is normal in size and structure.  9. The inferior vena cava was dilated in size with <50% respiratory variability. 10. The interatrial septum was not assessed.   Laboratory Data:  High Sensitivity Troponin:   Recent Labs  Lab 10/26/2018 1720 10/23/2018 1922  TROPONINIHS 213* 207*     Cardiac EnzymesNo results for input(s): TROPONINI in the last 168 hours. No results for input(s): TROPIPOC in the last 168 hours.  Chemistry Recent Labs  Lab 11/05/2018 1718 10/14/2018 1720 10/18/2018 1722  11/01/2018 0640 10/16/2018 1017 10/08/2018 1500  NA 126* 128*  --   --  134*  --   --   K 6.2* 6.5*  --    < > 5.2* 4.6 4.3  CL  --  94*  --   --  97*  --   --   CO2  --  22  --   --  21*  --   --   GLUCOSE  --  191*  --   --  103*  --   --   BUN  --  70*  --   --  73*  --   --   CREATININE  --  3.60* 3.60*  --  3.33*  --   --   CALCIUM  --  10.9*  --   --  10.2  --   --   GFRNONAA  --  15*  --   --  16*  --   --   GFRAA  --  17*  --   --  19*  --   --   ANIONGAP  --  12  --   --  16*  --   --    < > = values in this interval not displayed.    Recent Labs  Lab 10/14/2018 1720 11/01/2018 0640  PROT 6.9 6.3*  ALBUMIN 3.6 3.3*  AST 59* 84*  ALT 55* 65*  ALKPHOS 61 63  BILITOT 1.8* 1.9*   Hematology Recent Labs  Lab 11/05/2018 1718 10/28/2018 1720 10/14/2018 1017  WBC  --  13.1* 14.2*  RBC  --  3.65* 3.81*  HGB 11.2* 10.4* 10.7*  HCT 33.0* 33.2* 33.9*  MCV  --  91.0 89.0  MCH  --  28.5 28.1  MCHC  --  31.3 31.6  RDW  --  25.7* 26.0*  PLT  --  141* 114*   BNP Recent Labs  Lab 10/28/2018 1720  BNP 3,312.4*    DDimer No results for input(s): DDIMER in the last 168 hours.   Radiology/Studies:  Nm Parathyroid  W/spect  Result Date: 10/19/2018 CLINICAL DATA:  Hyperparathyroidism.  High PTH H EXAM: NM PARATHYROID SCINTIGRAPHY AND SPECT IMAGING TECHNIQUE: Following intravenous administration of radiopharmaceutical, early and 2-hour delayed planar images were obtained in the anterior projection. Delayed triplanar SPECT images were also obtained at 2 hours. RADIOPHARMACEUTICALS:  25.2 mCi Tc-73mSestamibi IV COMPARISON:  None FINDINGS: On delayed 2 hour imaging there is residual activity in the RIGHT lobe of thyroid gland. SPECT imaging of the neck demonstrates focal activity in the mid RIGHT thyroid gland positioned posterior to the gland. IMPRESSION: Focal activity posterior to the mid RIGHT thyroid gland would be consistent with a parathyroid adenoma. Electronically Signed   By: SSuzy BouchardM.D.   On: 11/01/2018 14:34   Dg Chest Port 1 View  Result Date: 10/23/2018 CLINICAL DATA:  Hypoxia. EXAM: PORTABLE CHEST 1 VIEW COMPARISON:  Radiograph October 20, 2018. FINDINGS: Stable cardiomegaly. Status post transcatheter aortic valve repair. Status post coronary bypass graft. Left-sided pacemaker is unchanged in position. No pneumothorax is noted. Significantly increased bilateral lung opacities are noted, right greater than left, most consistent with pneumonia or edema. No significant pleural effusion is noted. Bony thorax is unremarkable. IMPRESSION: Significantly increased bilateral lung opacities are noted concerning for worsening edema or pneumonia. Electronically Signed   By: JMarijo ConceptionM.D.   On: 10/29/2018 14:12   Dg Chest Port 1 View  Result Date: 11/05/2018 CLINICAL DATA:  Pt is SOB today with O2 in mid 80s. Pt states he feels very weak. Seen at nephrology today and told to come straight to ED. Pt's O2 increased from 2L [place] to 6L. Pt on non-rebreathe. Hx of CHF, HTN, cardiac arrest. EXAM: PORTABLE CHEST 1 VIEW COMPARISON:  09/02/2018 FINDINGS: There are changes from prior cardiac surgery and aortic valve  replacement. Cardiac silhouette is mildly enlarged. No mediastinal or hilar masses. Left anterior chest wall AICD is stable. There is vascular congestion with bilateral interstitial thickening and small bilateral pleural effusions. Findings are similar to the prior study. No pneumothorax. Skeletal structures are grossly intact. IMPRESSION: 1. Findings consistent with congestive heart failure, similar to what was present on the prior radiographs. No convincing pneumonia. Electronically Signed   By: DLajean ManesM.D.   On: 10/11/2018 17:02    Assessment and Plan:   1. Acute on chronic respiratory failure: Unclear if this is fully related to heart failure.  There has been discrepancy in his clinical presentation versus x-ray finding.  Despite being able to put out a liter of fluid, his chest x-ray is getting worse.  Patient was seen by Dr. BHaroldine Lawswho felt this is likely either amiodarone toxicity versus COVID-19 respiratory infection  2. Persistent atrial fibrillation: Recent device interrogation shows his A. fib burden is around 8.2.  He was paced out of atrial fibrillation using his device.  -He is paced on EKG at this point, unable to tell if underlying rhythm is atrial fibrillation.  He has a history of increasing shortness of breath due to underlying atrial fibrillation.  3. CAD s/p CABG in 1982 and 1994: Denies any recent chest pain.  4. Medtronic ICD: Device was interrogated recently.  5. Aortic stenosis s/p TAVR: Stable on last echocardiogram.  6. Hypertension  7. Hyperlipidemia  8. Obstructive sleep apnea      For questions or updates, please contact CTen SleepPlease consult [email or web address]  for contact info under     Signed, Almyra Deforest, [place]  10/08/2018 4:26 PM   Agree with above.  Echo, CXR and chart data all reviewed personally. D/w Dr. Cathlean Sauer by telephone.   85 y/o male with h/o systolic HF (EF ~44%), TAVR, PAF on amiodarone and CKD admitted with worsening SOB, AKI and  hyperkalemia. On admit CXR and BNP elevated suggestive of worsening HF. However, he diuresed fairly well overnight but dyspnea and CXR now getting much worse. Remains on BiPAP breathing 30x/min with abrupt desaturations on removing BIPAP. Had lower grade fever erecently. Several COVID tests all negative.   On exam elderly and frail  On BIPAP breathing 30 times per min JVP up to jaw Cor Distant HS Regular Lungs diffuse loud crackles Ab soft NT/ND Extremities. Warm and well perfused. No edema  While he clearly has HF, I feel symptoms and imagine well out of proportion to HF and concern for amio toxicity vs COVID. Will repeat high-dose lasix and metolazone now. Check ESR. D/w Hospitalist team and Renal who agree.   He is extremely tenuous and would ask Pulmonary to see sooner rather than later as he seems to be headed toward intubation. Previously DNR but now says he is ok with short-term intubation as needed.   CRITICAL CARE Performed by: Glori Bickers  Total critical care time: 45 minutes  Critical care time was exclusive of separately billable procedures and treating other patients.  Critical care was necessary to treat or prevent imminent or life-threatening deterioration.  Critical care was time spent personally by me (independent of midlevel providers or residents) on the following activities: development of treatment plan with patient and/or surrogate as well as nursing, discussions with consultants, evaluation of patient's response to treatment, examination of patient, obtaining history from patient or surrogate, ordering and performing treatments and interventions, ordering and review of laboratory studies, ordering and review of radiographic studies, pulse oximetry and re-evaluation of patient's condition.  Glori Bickers, MD  9:40 PM

## 2018-10-21 NOTE — Progress Notes (Signed)
   ESR markedly elevated at 98. This is c/w amio-induced lung toxicity.   Stop amio and diuretics for now. Start high-dose steroids solumedrol 60 IV q6 first dose now.   Glori Bickers, MD  9:34 PM

## 2018-10-21 NOTE — Progress Notes (Signed)
[facility] Progress Note   Subjective:   Has been on bipap since last night - c/o being hungry and thirsty but otherwise feeling a bit better.  Diuresed 1L last PM after lasix 80 IV.  Lasix dose increased this morning to 120mg  IV BID - given at 0610am. As of 1:30pm UOP for day is only ~323mL  Per RN if he takes off the bipap, which is at FiO2 100%, he quickly desats to the 60% and takes a bit to recover.   Objective Vitals:   11/07/2018 0455 11/02/2018 0753 10/20/2018 0800 10/20/2018 0813  BP:   (!) 112/53   Pulse: 71 72 71 69  Resp: (!) 35 (!) 36 (!) 32 (!) 25  Temp: 98.9 F (37.2 C)     TempSrc: Axillary     SpO2: 96% 96% 96% 91%  Weight: 65.8 kg     Height:       Physical Exam General: lying nearly flat in bed on bipap, calm and comfortable Heart: RRR Lungs: RR in high 20s low 30s on bipap but does not appear uncomfortable Abdomen: soft, nontender Extremities: no edema GU:  Condom cath with amber urine.   Additional Objective Labs: Basic Metabolic Panel: Recent Labs  Lab 10/11/2018 1718 11/02/2018 1720 10/15/2018 1722  11/03/2018 0225 11/06/2018 0640 10/25/2018 1017  NA 126* 128*  --   --   --  134*  --   K 6.2* 6.5*  --    < > 5.0 5.2* 4.6  CL  --  94*  --   --   --  97*  --   CO2  --  22  --   --   --  21*  --   GLUCOSE  --  191*  --   --   --  103*  --   BUN  --  70*  --   --   --  73*  --   CREATININE  --  3.60* 3.60*  --   --  3.33*  --   CALCIUM  --  10.9*  --   --   --  10.2  --    < > = values in this interval not displayed.   Liver Function Tests: Recent Labs  Lab 10/15/2018 1720 11/04/2018 0640  AST 59* 84*  ALT 55* 65*  ALKPHOS 61 63  BILITOT 1.8* 1.9*  PROT 6.9 6.3*  ALBUMIN 3.6 3.3*   No results for input(s): LIPASE, AMYLASE in the last 168 hours. CBC: Recent Labs  Lab 10/29/2018 1718 11/06/2018 1720 10/23/2018 1017  WBC  --  13.1* 14.2*  HGB 11.2* 10.4* 10.7*  HCT 33.0* 33.2* 33.9*  MCV  --  91.0 89.0  PLT  --  141* PENDING   Blood  Culture    Component Value Date/Time   SDES BLOOD RIGHT HAND 09/02/2018 1133   SPECREQUEST  09/02/2018 1133    BOTTLES DRAWN AEROBIC AND ANAEROBIC Blood Culture adequate volume   CULT  09/02/2018 1133    NO GROWTH 5 DAYS Performed at [facility], [place] [address], [place], [place] [address]    REPTSTATUS 09/07/2018 FINAL 09/02/2018 1133    Cardiac Enzymes: No results for input(s): CKTOTAL, CKMB, CKMBINDEX, TROPONINI in the last 168 hours. CBG: No results for input(s): GLUCAP in the last 168 hours. Iron Studies: No results for input(s): IRON, TIBC, TRANSFERRIN, FERRITIN in the last 72 hours. @lablastinr3 @ Studies/Results: Nm Parathyroid W/spect  Result Date: 10/25/2018 CLINICAL DATA:  Hyperparathyroidism.  High PTH H EXAM: NM PARATHYROID SCINTIGRAPHY AND SPECT IMAGING TECHNIQUE: Following intravenous administration of radiopharmaceutical, early and 2-hour delayed planar images were obtained in the anterior projection. Delayed triplanar SPECT images were also obtained at 2 hours. RADIOPHARMACEUTICALS:  25.2 mCi Tc-3m Sestamibi IV COMPARISON:  None FINDINGS: On delayed 2 hour imaging there is residual activity in the RIGHT lobe of thyroid gland. SPECT imaging of the neck demonstrates focal activity in the mid RIGHT thyroid gland positioned posterior to the gland. IMPRESSION: Focal activity posterior to the mid RIGHT thyroid gland would be consistent with a parathyroid adenoma. Electronically Signed   By: Suzy Bouchard M.D.   On: 10/15/2018 14:34   Dg Chest Port 1 View  Result Date: 11/05/2018 CLINICAL DATA:  Pt is SOB today with O2 in mid 80s. Pt states he feels very weak. Seen at nephrology today and told to come straight to ED. Pt's O2 increased from 2L [place] to 6L. Pt on non-rebreathe. Hx of CHF, HTN, cardiac arrest. EXAM: PORTABLE CHEST 1 VIEW COMPARISON:  09/02/2018 FINDINGS: There are changes from prior cardiac surgery and aortic valve replacement. Cardiac silhouette is mildly  enlarged. No mediastinal or hilar masses. Left anterior chest wall AICD is stable. There is vascular congestion with bilateral interstitial thickening and small bilateral pleural effusions. Findings are similar to the prior study. No pneumothorax. Skeletal structures are grossly intact. IMPRESSION: 1. Findings consistent with congestive heart failure, similar to what was present on the prior radiographs. No convincing pneumonia. Electronically Signed   By: Lajean Manes M.D.   On: 11/04/2018 17:02   Medications: . sodium chloride    . sodium chloride 250 mL (10/28/2018 0556)  . furosemide 120 mg (11/06/2018 0610)   . carvedilol  6.25 mg Oral BID WC  . chlorhexidine  15 mL Mouth Rinse BID  . darifenacin  7.5 mg Oral Daily  . mouth rinse  15 mL Mouth Rinse q12n4p  . Rivaroxaban  15 mg Oral Q supper  . rosuvastatin  40 mg Oral q1800  . sodium chloride flush  3 mL Intravenous Q12H    Assessment/Plan: **Acute hypoxic respiratory failure:  Initially presumed due to decompensated heart failure as evidenced by imaging, BNP and increased fluid intake in past few days. Diuresed well overnight with 1L UOP but despite that his O2 requirement has gone up and was satting low 90s on 100% bipap while we were talking; highest I saw was 96% briefly.  This seems out of proportion to pulmonary edema and I think additional evaluation (noncon CT, repeat COVID?) should be pursued - will d/w primary.   His diuresis has stalled despite lasix 120 IV this AM -- having RN check bladder scan.  Check CXR now.  Could augment diuresis further if needed - lasix 160 IV +/- metolazone if f/u imaging shows persistent pulmonary edema.    **AKI on CKD: Baseline lately ~2 > 3.6 on presentation 7/13.  I suspect that this is decompensated heart failure/cardiorenal physiology in global context of presentation and has improved to 3.3 with diuresis. Urine sodium < 10 which would support this as well.  Query mild uremic symptom with dysgeusia  in past few days.  Not an optimal dialysis candidate but if clinical status not improving can approach topic with patient and family.   **hyperkalemia:  resolved  - K 4.6 10am. Expect will remain ok in setting of diuresis.  **Hyponatremia:  Na 128 on admit and presentation consistent with hypervolemic hyponatremia.  Diuresis per  above has resulted in improvement to 134 this AM.   **Atrial fibrillation:  Rate controlled.  On home xarelto - if renal function remains poor may be better off on apixiban, follow.    **Primary hyperparathyroidism:  Hypercalcemia dating back >1y.  "Serologic" w/u via clinic unrevealing except elevated PTH.  S/p sestamibi scan 7/13 - suggests adenoma.  Corr calcium currently ~11.3 on admit but 10.6 on recheck after diuretic.  No acute therapy for now but consider initiating sensipar as poor surgical candidate and hyperCa not going to help renal function.   **possibel R RAS:  08/2018 renal artery duplex suggests R RAS.  R kidney 10.8 and L 12.5, cortical thinning worse on R. Outpt nephrologist considering CO2 angiography to confirm - will need clinical improvement prior to pursuing.  BP currently ok and I suspect his AKI is unrelated.  Unclear that stenting would improve CKD with cortical thinning already fairly severe on that side.   **Anemia:  Hb 10.7, reasonable.  T sat 08/2018 5% - rec'd feraheme and last check 25% in mid 09/2018.    Will follow closely, call with questions or concerns.   Jannifer Hick MD 11/02/2018, 12:38 PM  [facility] Pager: [phone]

## 2018-10-21 NOTE — Progress Notes (Signed)
PROGRESS NOTE    [name]  JOA:[id number] DOB: 02-14-1934 DOA: 10/09/2018 PCP: Binnie Rail, MD    Brief Narrative:  85 year old male who presented with hypoxia and generalized weakness.  He does have significant past medical history for coronary artery disease status post bypass grafting, ischemic cardiomyopathy, stage III chronic kidney disease, atrial fibrillation, aortic stenosis status post Taber and chronic hypoxic respiratory failure.  Recently discharged from the hospital about 6 weeks ago for acute on chronic heart failure.  Over the last few days he reported weight gain and increased oxygen requirements.  On his initial physical examination his oxygen saturation was 87% on 6 L/min per nasal cannula, he had moist mucous membranes, his lungs had increased work of breathing and bilateral rails, heart S1-S2 present and rhythmic, abdomen soft, no significant lower extremity edema.  Sodium 128, potassium 6.5, chloride 94, bicarb 22, glucose 191, BUN 70, creatinine 3.6, BNP 3312, white count 13.1, hemoglobin 10.4, hematocrit 33.2, platelets 141.  SARS COVID-19 negative.  His chest radiograph was left rotated, small bilateral pleural effusions, bilateral interstitial infiltrates predominantly at bases with vascular congestion.  EKG 70 bpm, 100% ventricular paced.   Patient was admitted to the hospital with a working diagnosis of acute on chronic hypoxic respiratory failure due to decompensated systolic heart failure.   Assessment & Plan:   Principal Problem:   Hyperkalemia Active Problems:   Atrial fibrillation (HCC)   Acute on chronic systolic heart failure (HCC)   Anemia   Acute on chronic combined systolic (congestive) and diastolic (congestive) heart failure (HCC)   Acute renal failure superimposed on stage 3 chronic kidney disease (HCC)   CAD (coronary artery disease)   Acute on chronic respiratory failure with hypoxia (HCC)   Hyponatremia   Hyperbilirubinemia   1. Acute on  chronic hypoxic respiratory failure due to cardiogenic pulmonary edema/ acute decompensation of chronic systolic heart failure. Patient has been on non-invasive mechanical ventilation, Epap of 5 and 100% Fi02. His respiratory rate has been in the 20 to 30 and oxygenation 88 to 90%. Follow up chest film with worsening bilateral infiltrates, urine output 1000 ml since admission.  Will increase furosemide to 160 mg IV q6 H, will add metolazone and continue positive pressure ventilation.   I discusses the case with Dr. Haroldine Laws and Dr Johnney Ou, cardiology and nephrology. Patient is not responding to initial management. May need need further respiratory support with invasive mechanical ventilation. Possible amiodarone induce acute lung injury per cardiology.   Will consult critical care for evaluation.   2.  AKI on CKD stage 3 with hyperkalemia. Will continue with aggressive diuresis, increased furosemide and added metolazone. Serum cr at 3,3 with K down to 4,3. Follow up renal panel in am.   3. Atrial fibrillation. Continue telemetry monitoring, patient now off amiodarone, anticoagulation with rivaroxabam.  4. Chronic multifactorial anemia. Continue cell count monitoring.    DVT prophylaxis: rivaroxaban   Code Status: full Family Communication: no family at the bedside  Disposition Plan/ discharge barriers: patient critically ill.   Patient is critically ill will continue supportive medical therapy including non invasive mechanical ventilation to prevent imminent deterioration.   Critical care time 45 minutes.   Body mass index is 23.41 kg/m. Malnutrition Type:      Malnutrition Characteristics:      Nutrition Interventions:     RN Pressure Injury Documentation:     Consultants:   Nephrology   Cardiology   Procedures:     Antimicrobials:  Subjective: Patient continue to have dyspnea, no chest pain, no nausea or vomiting. He is asking to eat and to remove  full face mask.   Objective: Vitals:   10/24/2018 0753 10/12/2018 0800 10/12/2018 0813 10/11/2018 1336  BP:  (!) 112/53  112/62  Pulse: 72 71 69 68  Resp: (!) 36 (!) 32 (!) 25 20  Temp:    (!) 97.5 F (36.4 C)  TempSrc:    Axillary  SpO2: 96% 96% 91% 91%  Weight:      Height:        Intake/Output Summary (Last 24 hours) at 10/15/2018 1438 Last data filed at 11/06/2018 1354 Gross per 24 hour  Intake 166.3 ml  Output 1375 ml  Net -1208.7 ml   Filed Weights   10/24/2018 2200 10/26/2018 0455  Weight: 65.8 kg 65.8 kg    Examination:   General: deconditioned and ill looking appearing  Neurology: Awake and alert, non focal  E ENT: mild pallor, no icterus, oral mucosa moist Cardiovascular: No JVD. S1-S2 present, rhythmic, no gallops, rubs, or murmurs. No lower extremity edema. Pulmonary: positive breath sounds bilaterally, decreased air movement, no wheezing or rhonchi diffuse bilateral wet rales. Gastrointestinal. Abdomen with no organomegaly, non tender, no rebound or guarding Skin. No rashes Musculoskeletal: no joint deformities     Data Reviewed: I have personally reviewed following labs and imaging studies  CBC: Recent Labs  Lab 10/23/2018 1718 10/13/2018 1720 11/06/2018 1017  WBC  --  13.1* 14.2*  HGB 11.2* 10.4* 10.7*  HCT 33.0* 33.2* 33.9*  MCV  --  91.0 89.0  PLT  --  141* 630*   Basic Metabolic Panel: Recent Labs  Lab 10/18/2018 1718 10/18/2018 1720 11/06/2018 1722 10/10/2018 2218 11/04/2018 0225 10/16/2018 0640 10/11/2018 1017  NA 126* 128*  --   --   --  134*  --   K 6.2* 6.5*  --  6.0* 5.0 5.2* 4.6  CL  --  94*  --   --   --  97*  --   CO2  --  22  --   --   --  21*  --   GLUCOSE  --  191*  --   --   --  103*  --   BUN  --  70*  --   --   --  73*  --   CREATININE  --  3.60* 3.60*  --   --  3.33*  --   CALCIUM  --  10.9*  --   --   --  10.2  --    GFR: Estimated Creatinine Clearance: 15.4 mL/min (A) (by C-G formula based on SCr of 3.33 mg/dL (H)). Liver Function Tests:  Recent Labs  Lab 10/14/2018 1720 11/02/2018 0640  AST 59* 84*  ALT 55* 65*  ALKPHOS 61 63  BILITOT 1.8* 1.9*  PROT 6.9 6.3*  ALBUMIN 3.6 3.3*   No results for input(s): LIPASE, AMYLASE in the last 168 hours. No results for input(s): AMMONIA in the last 168 hours. Coagulation Profile: No results for input(s): INR, PROTIME in the last 168 hours. Cardiac Enzymes: No results for input(s): CKTOTAL, CKMB, CKMBINDEX, TROPONINI in the last 168 hours. BNP (last 3 results) No results for input(s): PROBNP in the last 8760 hours. HbA1C: No results for input(s): HGBA1C in the last 72 hours. CBG: No results for input(s): GLUCAP in the last 168 hours. Lipid Profile: No results for input(s): CHOL, HDL, LDLCALC, TRIG, CHOLHDL, LDLDIRECT in the last  72 hours. Thyroid Function Tests: No results for input(s): TSH, T4TOTAL, FREET4, T3FREE, THYROIDAB in the last 72 hours. Anemia Panel: No results for input(s): VITAMINB12, FOLATE, FERRITIN, TIBC, IRON, RETICCTPCT in the last 72 hours.    Radiology Studies: I have reviewed all of the imaging during this hospital visit personally     Scheduled Meds: . carvedilol  6.25 mg Oral BID WC  . chlorhexidine  15 mL Mouth Rinse BID  . darifenacin  7.5 mg Oral Daily  . mouth rinse  15 mL Mouth Rinse q12n4p  . Rivaroxaban  15 mg Oral Q supper  . rosuvastatin  40 mg Oral q1800  . sodium chloride flush  3 mL Intravenous Q12H   Continuous Infusions: . sodium chloride    . sodium chloride 250 mL (11/05/2018 0556)  . furosemide 120 mg (11/02/2018 0610)     LOS: 1 day        Nafis Farnan Gerome Apley, MD

## 2018-10-21 NOTE — Consult Note (Signed)
  Brief Note. Full consult to follow.  85 y/o male with h/o systolic HF, TAVR and CKD admitted with worsening SOB, AKI and hyperkalemia.   Diuresed fairly well overnight but dyspnea and CXR now getting much worse.   Remains on BiPAP breathing 30x/min.   While he clearly has HF, I feel symptoms and imagine well out of proportion to HF and concern for amio toxicity vs COVID (first covid test negative).  Will repeat high-dose lasix and metolazone now. Check ESR.   D/w Hospitalist team and Renal who agree.   Would ask Pulmoanry to see sooner rather than later as he seems to be headed toward intubation. (He is ok with short-term intubation as needed)   Glori Bickers, MD  4:42 PM

## 2018-10-21 NOTE — Progress Notes (Signed)
I spoke with the patient's family about patient's condition, plan of care and all questions were addressed. 

## 2018-10-22 ENCOUNTER — Telehealth: Payer: Self-pay | Admitting: Internal Medicine

## 2018-10-22 LAB — UREA NITROGEN, URINE: Urea Nitrogen, Ur: 468 mg/dL

## 2018-10-24 NOTE — Telephone Encounter (Signed)
CALLED AND SPOKE WITH DAUGHTER

## 2018-10-27 ENCOUNTER — Ambulatory Visit: Payer: Medicare Other | Admitting: Cardiovascular Disease

## 2018-11-08 NOTE — Death Summary Note (Signed)
Death Summary  [name]:[id number] DOB: 1933-06-08 DOA: 2018/10/31  PCP: Binnie Rail, MD  Admit date: 10/31/2018 Date of Death:11-02-18 Notification: Binnie Rail, MD notified of death of 11-04-2018   History of present illness:  [name] is a 85 y.o. male with a history of history for coronary artery disease status post bypass grafting, ischemic cardiomyopathy, stage III chronic kidney disease, atrial fibrillation, aortic stenosis status post Taber and chronic hypoxic respiratory failure.  [name] presented with complaint of 85 year old male who presented with hypoxia and generalized weakness.  [name] did not improve after aggressive medical therapy including high doses of diuretics and non invasive mechanical ventilation.   Patient was recently discharged from the hospital about 6 weeks ago for acute on chronic heart failure.  Over the last few days he reported weight gain and increased oxygen requirements.  On his initial physical examination his oxygen saturation was 87% on 6 L/min per nasal cannula, he had moist mucous membranes, his lungs had increased work of breathing and bilateral rales, heart S1-S2 present and rhythmic, abdomen soft, no significant lower extremity edema.  Sodium 128, potassium 6.5, chloride 94, bicarb 22, glucose 191, BUN 70, creatinine 3.6, BNP 3312, white count 13.1, hemoglobin 10.4, hematocrit 33.2, platelets 141.  SARS COVID-19 negative.  His chest radiograph was left rotated, small bilateral pleural effusions, bilateral interstitial infiltrates predominantly at bases with vascular congestion.  EKG 70 bpm, 100% ventricular paced.   Patient was admitted to the hospital with a working diagnosis of acute on chronic hypoxic respiratory failure due to decompensated systolic heart failure.   Patient's condition rapidly deteriorated despite aggressive medical therapy.  Critical care was consulted including cardiology and nephrology.   Patient made decision to be DO NOT RESUSCITATE, and this was confirmed with his wife.  Unfortunately patient did not respond to noninvasive mechanical ventilation.     Final Diagnoses:  1. Acute on chronic hypoxic respiratory failure due to cardiogenic pulmonary edema/ acute decompensation of chronic systolic heart failure.  2.  AKI on CKD stage 3 with hyperkalemia.   3. Atrial fibrillation.   4. Chronic multifactorial anemia  The results of significant diagnostics from this hospitalization (including imaging, microbiology, ancillary and laboratory) are listed below for reference.    Significant Diagnostic Studies: Nm Parathyroid W/spect  Result Date: 10-31-18 CLINICAL DATA:  Hyperparathyroidism.  High PTH H EXAM: NM PARATHYROID SCINTIGRAPHY AND SPECT IMAGING TECHNIQUE: Following intravenous administration of radiopharmaceutical, early and 2-hour delayed planar images were obtained in the anterior projection. Delayed triplanar SPECT images were also obtained at 2 hours. RADIOPHARMACEUTICALS:  25.2 mCi Tc-69m Sestamibi IV COMPARISON:  None FINDINGS: On delayed 2 hour imaging there is residual activity in the RIGHT lobe of thyroid gland. SPECT imaging of the neck demonstrates focal activity in the mid RIGHT thyroid gland positioned posterior to the gland. IMPRESSION: Focal activity posterior to the mid RIGHT thyroid gland would be consistent with a parathyroid adenoma. Electronically Signed   By: Suzy Bouchard M.D.   On: 10/31/2018 14:34   Dg Chest Port 1 View  Result Date: 10/09/2018 CLINICAL DATA:  Hypoxia. EXAM: PORTABLE CHEST 1 VIEW COMPARISON:  Radiograph 10-31-2018. FINDINGS: Stable cardiomegaly. Status post transcatheter aortic valve repair. Status post coronary bypass graft. Left-sided pacemaker is unchanged in position. No pneumothorax is noted. Significantly increased bilateral lung opacities are noted, right greater than left, most consistent with pneumonia or edema. No  significant pleural effusion is noted. Bony thorax  is unremarkable. IMPRESSION: Significantly increased bilateral lung opacities are noted concerning for worsening edema or pneumonia. Electronically Signed   By: Marijo Conception M.D.   On: 10/19/2018 14:12   Dg Chest Port 1 View  Result Date: 10/11/2018 CLINICAL DATA:  Pt is SOB today with O2 in mid 80s. Pt states he feels very weak. Seen at nephrology today and told to come straight to ED. Pt's O2 increased from 2L [place] to 6L. Pt on non-rebreathe. Hx of CHF, HTN, cardiac arrest. EXAM: PORTABLE CHEST 1 VIEW COMPARISON:  09/02/2018 FINDINGS: There are changes from prior cardiac surgery and aortic valve replacement. Cardiac silhouette is mildly enlarged. No mediastinal or hilar masses. Left anterior chest wall AICD is stable. There is vascular congestion with bilateral interstitial thickening and small bilateral pleural effusions. Findings are similar to the prior study. No pneumothorax. Skeletal structures are grossly intact. IMPRESSION: 1. Findings consistent with congestive heart failure, similar to what was present on the prior radiographs. No convincing pneumonia. Electronically Signed   By: Lajean Manes M.D.   On: 10/25/2018 17:02    Microbiology: Recent Results (from the past 240 hour(s))  SARS Coronavirus 2 (CEPHEID - Performed in [facility] hospital lab), Hosp Order     Status: None   Collection Time: 10/12/2018  7:27 PM   Specimen: Nasopharyngeal Swab  Result Value Ref Range Status   SARS Coronavirus 2 NEGATIVE NEGATIVE Final    Comment: (NOTE) If result is NEGATIVE SARS-CoV-2 target nucleic acids are NOT DETECTED. The SARS-CoV-2 RNA is generally detectable in upper and lower  respiratory specimens during the acute phase of infection. The lowest  concentration of SARS-CoV-2 viral copies this assay can detect is 250  copies / mL. A negative result does not preclude SARS-CoV-2 infection  and should not be used as the sole basis for treatment  or other  patient management decisions.  A negative result may occur with  improper specimen collection / handling, submission of specimen other  than nasopharyngeal swab, presence of viral mutation(s) within the  areas targeted by this assay, and inadequate number of viral copies  (<250 copies / mL). A negative result must be combined with clinical  observations, patient history, and epidemiological information. If result is POSITIVE SARS-CoV-2 target nucleic acids are DETECTED. The SARS-CoV-2 RNA is generally detectable in upper and lower  respiratory specimens dur ing the acute phase of infection.  Positive  results are indicative of active infection with SARS-CoV-2.  Clinical  correlation with patient history and other diagnostic information is  necessary to determine patient infection status.  Positive results do  not rule out bacterial infection or co-infection with other viruses. If result is PRESUMPTIVE POSTIVE SARS-CoV-2 nucleic acids MAY BE PRESENT.   A presumptive positive result was obtained on the submitted specimen  and confirmed on repeat testing.  While 2019 novel coronavirus  (SARS-CoV-2) nucleic acids may be present in the submitted sample  additional confirmatory testing may be necessary for epidemiological  and / or clinical management purposes  to differentiate between  SARS-CoV-2 and other Sarbecovirus currently known to infect humans.  If clinically indicated additional testing with an alternate test  methodology [id number]) is advised. The SARS-CoV-2 RNA is generally  detectable in upper and lower respiratory sp ecimens during the acute  phase of infection. The expected result is Negative. Fact Sheet for Patients:  StrictlyIdeas.no Fact Sheet for Healthcare Providers: BankingDealers.co.za This test is not yet approved or cleared by the [place] FDA and  has been authorized for detection and/or diagnosis of  SARS-CoV-2 by FDA under an Emergency Use Authorization (EUA).  This EUA will remain in effect (meaning this test can be used) for the duration of the COVID-19 declaration under Section 564(b)(1) of the Act, 21 U.S.C. section 360bbb-3(b)(1), unless the authorization is terminated or revoked sooner. Performed at [facility], [place] [address] [place], [place] [address]      Labs: Basic Metabolic Panel: Recent Labs  Lab 10/10/2018 1718 11/04/2018 1720 11/05/2018 1722  10/08/2018 0640  10/17/2018 1906 10/27/2018 2056  NA 126* 128*  --   --  134*  --   --   --   K 6.2* 6.5*  --    < > 5.2*   < > 3.9 3.7  CL  --  94*  --   --  97*  --   --   --   CO2  --  22  --   --  21*  --   --   --   GLUCOSE  --  191*  --   --  103*  --   --   --   BUN  --  70*  --   --  73*  --   --   --   CREATININE  --  3.60* 3.60*  --  3.33*  --   --   --   CALCIUM  --  10.9*  --   --  10.2  --   --   --    < > = values in this interval not displayed.   Liver Function Tests: Recent Labs  Lab 10/28/2018 1720 11/03/2018 0640  AST 59* 84*  ALT 55* 65*  ALKPHOS 61 63  BILITOT 1.8* 1.9*  PROT 6.9 6.3*  ALBUMIN 3.6 3.3*   No results for input(s): LIPASE, AMYLASE in the last 168 hours. No results for input(s): AMMONIA in the last 168 hours. CBC: Recent Labs  Lab 10/10/2018 1718 10/24/2018 1720 10/15/2018 1017  WBC  --  13.1* 14.2*  HGB 11.2* 10.4* 10.7*  HCT 33.0* 33.2* 33.9*  MCV  --  91.0 89.0  PLT  --  141* 114*   Cardiac Enzymes: No results for input(s): CKTOTAL, CKMB, CKMBINDEX, TROPONINI in the last 168 hours. D-Dimer No results for input(s): DDIMER in the last 72 hours. BNP: Invalid input(s): POCBNP CBG: No results for input(s): GLUCAP in the last 168 hours. Anemia work up No results for input(s): VITAMINB12, FOLATE, FERRITIN, TIBC, IRON, RETICCTPCT in the last 72 hours. Urinalysis    Component Value Date/Time   COLORURINE AMBER (A) 10/18/2018 0159   APPEARANCEUR HAZY (A) 10/18/2018 0159    LABSPEC 1.013 11/04/2018 0159   PHURINE 5.0 10/16/2018 0159   GLUCOSEU NEGATIVE 10/24/2018 0159   HGBUR NEGATIVE 10/16/2018 0159   BILIRUBINUR NEGATIVE 11/07/2018 0159   BILIRUBINUR negative 03/14/2017 1422   BILIRUBINUR neg 09/08/2014 1948   KETONESUR NEGATIVE 11/03/2018 0159   PROTEINUR 30 (A) 10/18/2018 0159   UROBILINOGEN 0.2 03/14/2017 1422   UROBILINOGEN 0.2 03/19/2014 1418   NITRITE NEGATIVE 11/07/2018 0159   LEUKOCYTESUR NEGATIVE 10/16/2018 0159   Sepsis Labs Invalid input(s): PROCALCITONIN,  WBC,  LACTICIDVEN     SIGNED:  Tawni Millers, MD  [facility] 10/24/2018, 5:02 PM Pager   If 7PM-7AM, please contact night-coverage [email or web address] Password [email or web address]

## 2018-11-08 NOTE — Telephone Encounter (Signed)
New message   Patient would like for the nurse to give her a call in reference to the patient passing away. Please call.

## 2018-11-08 DEATH — deceased

## 2019-02-24 ENCOUNTER — Encounter: Payer: Medicare Other | Admitting: Internal Medicine

## 2019-06-30 ENCOUNTER — Ambulatory Visit: Payer: Medicare Other | Admitting: Internal Medicine
